# Patient Record
Sex: Female | Born: 1945 | Race: Black or African American | Hispanic: No | Smoking: Never smoker
Health system: Southern US, Community
[De-identification: ages and names within clinical notes are randomized; demographics above are authoritative.]

## PROBLEM LIST (undated history)

## (undated) DIAGNOSIS — F32A Depression, unspecified: Secondary | ICD-10-CM

## (undated) DIAGNOSIS — I10 Essential (primary) hypertension: Secondary | ICD-10-CM

## (undated) DIAGNOSIS — Z780 Asymptomatic menopausal state: Secondary | ICD-10-CM

## (undated) DIAGNOSIS — M1990 Unspecified osteoarthritis, unspecified site: Secondary | ICD-10-CM

## (undated) DIAGNOSIS — M26629 Arthralgia of temporomandibular joint, unspecified side: Secondary | ICD-10-CM

## (undated) DIAGNOSIS — M7918 Myalgia, other site: Secondary | ICD-10-CM

## (undated) DIAGNOSIS — G8929 Other chronic pain: Secondary | ICD-10-CM

## (undated) DIAGNOSIS — T7840XA Allergy, unspecified, initial encounter: Secondary | ICD-10-CM

## (undated) DIAGNOSIS — E785 Hyperlipidemia, unspecified: Secondary | ICD-10-CM

## (undated) DIAGNOSIS — M549 Dorsalgia, unspecified: Secondary | ICD-10-CM

## (undated) DIAGNOSIS — F329 Major depressive disorder, single episode, unspecified: Secondary | ICD-10-CM

## (undated) HISTORY — DX: Hyperlipidemia, unspecified: E78.5

## (undated) HISTORY — DX: Unspecified osteoarthritis, unspecified site: M19.90

## (undated) HISTORY — DX: Allergy, unspecified, initial encounter: T78.40XA

## (undated) HISTORY — DX: Essential (primary) hypertension: I10

## (undated) HISTORY — PX: OTHER SURGICAL HISTORY: SHX169

## (undated) HISTORY — DX: Asymptomatic menopausal state: Z78.0

## (undated) HISTORY — DX: Arthralgia of temporomandibular joint, unspecified side: M26.629

## (undated) HISTORY — DX: Depression, unspecified: F32.A

## (undated) HISTORY — DX: Myalgia, other site: M79.18

## (undated) HISTORY — DX: Major depressive disorder, single episode, unspecified: F32.9

---

## 1998-12-13 ENCOUNTER — Other Ambulatory Visit: Admission: RE | Admit: 1998-12-13 | Discharge: 1998-12-13 | Payer: Self-pay | Admitting: Obstetrics & Gynecology

## 2000-06-11 ENCOUNTER — Other Ambulatory Visit: Admission: RE | Admit: 2000-06-11 | Discharge: 2000-06-11 | Payer: Self-pay | Admitting: Obstetrics & Gynecology

## 2001-10-28 ENCOUNTER — Other Ambulatory Visit: Admission: RE | Admit: 2001-10-28 | Discharge: 2001-10-28 | Payer: Self-pay | Admitting: Obstetrics & Gynecology

## 2002-08-29 ENCOUNTER — Encounter: Payer: Self-pay | Admitting: Internal Medicine

## 2004-04-25 ENCOUNTER — Ambulatory Visit: Payer: Self-pay | Admitting: Pain Medicine

## 2004-04-26 ENCOUNTER — Ambulatory Visit: Payer: Self-pay | Admitting: Pain Medicine

## 2004-05-12 ENCOUNTER — Ambulatory Visit: Payer: Self-pay | Admitting: Pain Medicine

## 2004-05-13 ENCOUNTER — Ambulatory Visit: Payer: Self-pay | Admitting: Pain Medicine

## 2004-06-06 ENCOUNTER — Ambulatory Visit: Payer: Self-pay | Admitting: Physician Assistant

## 2004-08-10 ENCOUNTER — Other Ambulatory Visit: Admission: RE | Admit: 2004-08-10 | Discharge: 2004-08-10 | Payer: Self-pay | Admitting: Obstetrics & Gynecology

## 2004-08-30 ENCOUNTER — Ambulatory Visit: Payer: Self-pay | Admitting: Physician Assistant

## 2004-12-23 ENCOUNTER — Ambulatory Visit: Payer: Self-pay | Admitting: Internal Medicine

## 2004-12-26 ENCOUNTER — Ambulatory Visit: Payer: Self-pay | Admitting: Internal Medicine

## 2004-12-27 ENCOUNTER — Ambulatory Visit: Payer: Self-pay | Admitting: Physician Assistant

## 2005-03-08 ENCOUNTER — Ambulatory Visit: Payer: Self-pay | Admitting: Family Medicine

## 2005-05-11 ENCOUNTER — Ambulatory Visit: Payer: Self-pay | Admitting: Physician Assistant

## 2005-08-25 ENCOUNTER — Ambulatory Visit: Payer: Self-pay | Admitting: Internal Medicine

## 2005-09-05 ENCOUNTER — Ambulatory Visit: Payer: Self-pay | Admitting: Physician Assistant

## 2005-11-17 ENCOUNTER — Ambulatory Visit: Payer: Self-pay | Admitting: Internal Medicine

## 2006-01-04 ENCOUNTER — Ambulatory Visit: Payer: Self-pay | Admitting: Physician Assistant

## 2006-02-01 ENCOUNTER — Ambulatory Visit: Payer: Self-pay | Admitting: Pain Medicine

## 2006-02-06 ENCOUNTER — Ambulatory Visit: Payer: Self-pay | Admitting: Pain Medicine

## 2006-02-20 ENCOUNTER — Ambulatory Visit: Payer: Self-pay | Admitting: Pain Medicine

## 2006-03-01 ENCOUNTER — Ambulatory Visit: Payer: Self-pay | Admitting: Pain Medicine

## 2006-03-08 ENCOUNTER — Ambulatory Visit: Payer: Self-pay | Admitting: Pain Medicine

## 2006-03-23 ENCOUNTER — Ambulatory Visit: Payer: Self-pay | Admitting: Internal Medicine

## 2006-04-09 ENCOUNTER — Ambulatory Visit: Payer: Self-pay | Admitting: Physician Assistant

## 2006-08-16 ENCOUNTER — Ambulatory Visit: Payer: Self-pay | Admitting: Physician Assistant

## 2006-08-20 ENCOUNTER — Ambulatory Visit: Payer: Self-pay | Admitting: Internal Medicine

## 2006-11-20 ENCOUNTER — Ambulatory Visit: Payer: Self-pay | Admitting: Physician Assistant

## 2007-01-04 ENCOUNTER — Encounter (INDEPENDENT_AMBULATORY_CARE_PROVIDER_SITE_OTHER): Payer: Self-pay | Admitting: *Deleted

## 2007-03-19 ENCOUNTER — Telehealth (INDEPENDENT_AMBULATORY_CARE_PROVIDER_SITE_OTHER): Payer: Self-pay | Admitting: *Deleted

## 2007-03-21 ENCOUNTER — Ambulatory Visit: Payer: Self-pay | Admitting: Physician Assistant

## 2007-04-16 ENCOUNTER — Ambulatory Visit: Payer: Self-pay | Admitting: Physician Assistant

## 2007-06-18 ENCOUNTER — Telehealth (INDEPENDENT_AMBULATORY_CARE_PROVIDER_SITE_OTHER): Payer: Self-pay | Admitting: *Deleted

## 2007-07-01 ENCOUNTER — Ambulatory Visit: Payer: Self-pay | Admitting: Internal Medicine

## 2007-07-01 DIAGNOSIS — N951 Menopausal and female climacteric states: Secondary | ICD-10-CM | POA: Insufficient documentation

## 2007-07-01 DIAGNOSIS — E785 Hyperlipidemia, unspecified: Secondary | ICD-10-CM | POA: Insufficient documentation

## 2007-07-01 DIAGNOSIS — F321 Major depressive disorder, single episode, moderate: Secondary | ICD-10-CM | POA: Insufficient documentation

## 2007-07-01 DIAGNOSIS — F418 Other specified anxiety disorders: Secondary | ICD-10-CM

## 2007-07-01 DIAGNOSIS — I10 Essential (primary) hypertension: Secondary | ICD-10-CM | POA: Insufficient documentation

## 2007-07-01 DIAGNOSIS — F32A Depression, unspecified: Secondary | ICD-10-CM | POA: Insufficient documentation

## 2007-07-01 DIAGNOSIS — J309 Allergic rhinitis, unspecified: Secondary | ICD-10-CM | POA: Insufficient documentation

## 2007-07-08 LAB — CONVERTED CEMR LAB
BUN: 10 mg/dL (ref 6–23)
Basophils Relative: 0.4 % (ref 0.0–1.0)
CO2: 30 meq/L (ref 19–32)
Calcium: 9.9 mg/dL (ref 8.4–10.5)
Chloride: 99 meq/L (ref 96–112)
Eosinophils Absolute: 0.1 10*3/uL (ref 0.0–0.6)
Eosinophils Relative: 1.6 % (ref 0.0–5.0)
GFR calc Af Amer: 110 mL/min
GFR calc non Af Amer: 91 mL/min
Glucose, Bld: 92 mg/dL (ref 70–99)
Monocytes Relative: 6.9 % (ref 3.0–11.0)
Neutro Abs: 2.7 10*3/uL (ref 1.4–7.7)
Platelets: 201 10*3/uL (ref 150–400)
RBC: 4.28 M/uL (ref 3.87–5.11)
WBC: 5.2 10*3/uL (ref 4.5–10.5)

## 2007-08-15 ENCOUNTER — Ambulatory Visit: Payer: Self-pay | Admitting: Internal Medicine

## 2007-10-23 ENCOUNTER — Ambulatory Visit: Payer: Self-pay | Admitting: Physician Assistant

## 2007-11-21 ENCOUNTER — Ambulatory Visit: Payer: Self-pay | Admitting: Physician Assistant

## 2008-02-25 ENCOUNTER — Ambulatory Visit: Payer: Self-pay | Admitting: Physician Assistant

## 2008-02-25 ENCOUNTER — Encounter: Payer: Self-pay | Admitting: Internal Medicine

## 2008-04-17 ENCOUNTER — Ambulatory Visit: Payer: Self-pay | Admitting: Internal Medicine

## 2008-04-17 ENCOUNTER — Encounter: Payer: Self-pay | Admitting: Internal Medicine

## 2008-04-17 ENCOUNTER — Other Ambulatory Visit: Admission: RE | Admit: 2008-04-17 | Discharge: 2008-04-17 | Payer: Self-pay | Admitting: Internal Medicine

## 2008-04-20 ENCOUNTER — Ambulatory Visit: Payer: Self-pay | Admitting: Internal Medicine

## 2008-04-23 ENCOUNTER — Encounter (INDEPENDENT_AMBULATORY_CARE_PROVIDER_SITE_OTHER): Payer: Self-pay | Admitting: *Deleted

## 2008-04-24 ENCOUNTER — Telehealth (INDEPENDENT_AMBULATORY_CARE_PROVIDER_SITE_OTHER): Payer: Self-pay | Admitting: *Deleted

## 2008-04-24 LAB — CONVERTED CEMR LAB
ALT: 15 units/L (ref 0–35)
AST: 18 units/L (ref 0–37)
Basophils Absolute: 0 10*3/uL (ref 0.0–0.1)
Basophils Relative: 1.2 % (ref 0.0–3.0)
CO2: 30 meq/L (ref 19–32)
Chloride: 108 meq/L (ref 96–112)
Creatinine, Ser: 0.6 mg/dL (ref 0.4–1.2)
Direct LDL: 199.5 mg/dL
Eosinophils Absolute: 0.1 10*3/uL (ref 0.0–0.7)
GFR calc non Af Amer: 109 mL/min
HDL: 44.7 mg/dL (ref >39.0)
Lymphocytes Relative: 38.3 % (ref 12.0–46.0)
MCHC: 34 g/dL (ref 30.0–36.0)
MCV: 90 fL (ref 78.0–100.0)
Neutrophils Relative %: 46.7 % (ref 43.0–77.0)
Platelets: 175 10*3/uL (ref 150–400)
Potassium: 4.3 meq/L (ref 3.5–5.1)
RBC: 4.21 M/uL (ref 3.87–5.11)
Sodium: 143 meq/L (ref 135–145)
TSH: 1.03 microintl units/mL (ref 0.35–5.50)
Triglycerides: 80 mg/dL (ref 0–149)
VLDL: 16 mg/dL (ref 0–40)
Vit D, 1,25-Dihydroxy: 11 — ABNORMAL LOW (ref 30–89)

## 2008-04-30 ENCOUNTER — Encounter: Payer: Self-pay | Admitting: Internal Medicine

## 2008-04-30 ENCOUNTER — Encounter: Admission: RE | Admit: 2008-04-30 | Discharge: 2008-04-30 | Payer: Self-pay | Admitting: Internal Medicine

## 2008-05-08 ENCOUNTER — Encounter (INDEPENDENT_AMBULATORY_CARE_PROVIDER_SITE_OTHER): Payer: Self-pay | Admitting: *Deleted

## 2008-08-18 ENCOUNTER — Ambulatory Visit: Payer: Self-pay | Admitting: Physician Assistant

## 2008-09-03 ENCOUNTER — Encounter: Payer: Self-pay | Admitting: Pain Medicine

## 2008-09-15 ENCOUNTER — Ambulatory Visit: Payer: Self-pay | Admitting: Physician Assistant

## 2008-09-21 ENCOUNTER — Encounter: Payer: Self-pay | Admitting: Pain Medicine

## 2008-10-22 ENCOUNTER — Encounter: Payer: Self-pay | Admitting: Pain Medicine

## 2008-11-03 ENCOUNTER — Ambulatory Visit: Payer: Self-pay | Admitting: Internal Medicine

## 2008-11-03 DIAGNOSIS — E559 Vitamin D deficiency, unspecified: Secondary | ICD-10-CM | POA: Insufficient documentation

## 2008-12-15 ENCOUNTER — Ambulatory Visit: Payer: Self-pay | Admitting: Physician Assistant

## 2008-12-29 ENCOUNTER — Ambulatory Visit: Payer: Self-pay | Admitting: Internal Medicine

## 2009-01-01 ENCOUNTER — Telehealth: Payer: Self-pay | Admitting: Internal Medicine

## 2009-01-01 LAB — CONVERTED CEMR LAB
ALT: 18 units/L (ref 0–35)
AST: 20 units/L (ref 0–37)
Direct LDL: 161.3 mg/dL
HDL: 46.2 mg/dL (ref >39.00)

## 2009-01-21 ENCOUNTER — Ambulatory Visit: Payer: Self-pay | Admitting: Internal Medicine

## 2009-04-06 ENCOUNTER — Telehealth (INDEPENDENT_AMBULATORY_CARE_PROVIDER_SITE_OTHER): Payer: Self-pay | Admitting: *Deleted

## 2009-04-15 ENCOUNTER — Ambulatory Visit: Payer: Self-pay | Admitting: Physician Assistant

## 2009-05-05 ENCOUNTER — Ambulatory Visit: Payer: Self-pay | Admitting: Internal Medicine

## 2009-05-05 ENCOUNTER — Encounter: Payer: Self-pay | Admitting: Internal Medicine

## 2009-05-05 ENCOUNTER — Other Ambulatory Visit: Admission: RE | Admit: 2009-05-05 | Discharge: 2009-05-05 | Payer: Self-pay | Admitting: Internal Medicine

## 2009-05-17 ENCOUNTER — Encounter (INDEPENDENT_AMBULATORY_CARE_PROVIDER_SITE_OTHER): Payer: Self-pay | Admitting: *Deleted

## 2009-05-24 ENCOUNTER — Encounter: Admission: RE | Admit: 2009-05-24 | Discharge: 2009-05-24 | Payer: Self-pay | Admitting: Internal Medicine

## 2009-06-25 ENCOUNTER — Ambulatory Visit: Payer: Self-pay | Admitting: Family

## 2009-07-13 ENCOUNTER — Ambulatory Visit: Payer: Self-pay | Admitting: Physician Assistant

## 2009-07-30 ENCOUNTER — Encounter (INDEPENDENT_AMBULATORY_CARE_PROVIDER_SITE_OTHER): Payer: Self-pay | Admitting: *Deleted

## 2009-07-30 ENCOUNTER — Ambulatory Visit: Payer: Self-pay | Admitting: Internal Medicine

## 2009-08-02 ENCOUNTER — Encounter (INDEPENDENT_AMBULATORY_CARE_PROVIDER_SITE_OTHER): Payer: Self-pay | Admitting: *Deleted

## 2009-08-02 LAB — CONVERTED CEMR LAB: Fecal Occult Bld: NEGATIVE

## 2009-08-27 ENCOUNTER — Ambulatory Visit: Payer: Self-pay | Admitting: Family Medicine

## 2009-09-15 ENCOUNTER — Telehealth (INDEPENDENT_AMBULATORY_CARE_PROVIDER_SITE_OTHER): Payer: Self-pay | Admitting: *Deleted

## 2009-09-23 ENCOUNTER — Ambulatory Visit: Payer: Self-pay | Admitting: Internal Medicine

## 2009-09-27 LAB — CONVERTED CEMR LAB
ALT: 22 units/L (ref 0–35)
Total CHOL/HDL Ratio: 3
Triglycerides: 73 mg/dL (ref 0.0–149.0)

## 2009-09-28 ENCOUNTER — Ambulatory Visit: Payer: Self-pay | Admitting: Internal Medicine

## 2009-09-28 DIAGNOSIS — R631 Polydipsia: Secondary | ICD-10-CM | POA: Insufficient documentation

## 2009-09-28 LAB — CONVERTED CEMR LAB: Glucose, Bld: 94 mg/dL

## 2009-10-21 ENCOUNTER — Encounter: Payer: Self-pay | Admitting: Internal Medicine

## 2009-10-21 ENCOUNTER — Ambulatory Visit: Payer: Self-pay | Admitting: Pain Medicine

## 2009-11-03 ENCOUNTER — Ambulatory Visit: Payer: Self-pay | Admitting: Internal Medicine

## 2009-11-30 ENCOUNTER — Ambulatory Visit: Payer: Self-pay | Admitting: Pain Medicine

## 2009-12-13 ENCOUNTER — Ambulatory Visit: Payer: Self-pay | Admitting: Pain Medicine

## 2010-04-11 ENCOUNTER — Ambulatory Visit: Payer: Self-pay | Admitting: Internal Medicine

## 2010-04-28 ENCOUNTER — Telehealth: Payer: Self-pay | Admitting: Internal Medicine

## 2010-05-17 ENCOUNTER — Ambulatory Visit: Payer: Self-pay | Admitting: Internal Medicine

## 2010-05-17 DIAGNOSIS — M26629 Arthralgia of temporomandibular joint, unspecified side: Secondary | ICD-10-CM

## 2010-05-17 HISTORY — DX: Arthralgia of temporomandibular joint, unspecified side: M26.629

## 2010-05-25 ENCOUNTER — Encounter: Admission: RE | Admit: 2010-05-25 | Discharge: 2010-05-25 | Payer: Self-pay | Admitting: Internal Medicine

## 2010-07-12 ENCOUNTER — Ambulatory Visit: Payer: Self-pay | Admitting: Pain Medicine

## 2010-08-17 ENCOUNTER — Ambulatory Visit: Payer: Self-pay | Admitting: Pain Medicine

## 2010-08-21 LAB — CONVERTED CEMR LAB
AST: 17 units/L (ref 0–37)
CO2: 29 meq/L (ref 19–32)
Calcium: 9.5 mg/dL (ref 8.4–10.5)
Cholesterol: 235 mg/dL — ABNORMAL HIGH (ref 0–200)
Direct LDL: 181.3 mg/dL
GFR calc non Af Amer: 90.46 mL/min (ref >60)
Potassium: 4.3 meq/L (ref 3.5–5.1)
Sodium: 142 meq/L (ref 135–145)
Total CHOL/HDL Ratio: 5
Triglycerides: 68 mg/dL (ref 0.0–149.0)
Vit D, 25-Hydroxy: 27 ng/mL — ABNORMAL LOW (ref 30–89)

## 2010-08-22 ENCOUNTER — Telehealth: Payer: Self-pay | Admitting: Internal Medicine

## 2010-08-24 ENCOUNTER — Telehealth (INDEPENDENT_AMBULATORY_CARE_PROVIDER_SITE_OTHER): Payer: Self-pay | Admitting: *Deleted

## 2010-08-25 NOTE — Assessment & Plan Note (Signed)
Summary: FOR MED REFILL AND HANDICAP /FLU SHOT//PH   Vital Signs:  Patient profile:   65 year old female Weight:      275 pounds Pulse rate:   85 / minute Pulse rhythm:   regular BP sitting:   128 / 82  (left arm) Cuff size:   large  Vitals Entered By: Army Fossa CMA (April 11, 2010 1:49 PM) CC: Pt here for Med refill Comments would like a permament handicap sticker Back pain still bothering her pharm- [facility] [address]   History of Present Illness: routine office visit Needs her medication refill I did review a letter from pain management requested me to take over her refill of pain medicine.  ROS Good medication compliance with Crestor  and BP meds as far as the anxiety and depression symptoms are well controlled, because she is taking several meds, she tried to " cutdown" on Zoloft but she got nervous, unclear if she did that gradually or abruptly. Overall she would like to see if she can take less SSRIs. She has good days and a few bad days.    Current Medications (verified): 1)  Crestor 20 Mg Tabs (Rosuvastatin Calcium) .... Take 1/2 Tablet Daily 2)  Hydrochlorothiazide 25 Mg Tabs (Hydrochlorothiazide) .[name].. 1 By Mouth Qd 3)  Zestril 10 Mg Tabs (Lisinopril) .[name].. 1 By Mouth Qd 4)  Zoloft 100 Mg  Tabs (Sertraline Hcl) .[name].. 1 Daily 5)  Vicodin 5-500 Mg  Tabs (Hydrocodone-Acetaminophen) .... Qid As Needed Back Pain 6)  Flexeril 10 Mg  Tabs (Cyclobenzaprine Hcl) .... Tid 7)  Lidoderm 5 %  Ptch (Lidocaine) 8)  Meloxicam 7.5 Mg Tabs (Meloxicam) .... Take 1 By Mouth Daily 9)  Asa 81mg  .... 1 By Mouth Qd 10)  Peppermint Spirit  Sprt (Peppermint Spirit) .... Take One Daily 11)  Fish Oil 12)  Otc Vit D  Allergies (verified): 1)  ! Zocor (Simvastatin)  Past History:  Past Medical History: Allergic rhinitis Hyperlipidemia Hypertension Osteoarthritis--chronic back and knee pain, see pain management ; on disability Depression, h/o Menopause , onset age  69  Past Surgical History: Reviewed history from 08/15/2007 and no changes required. gravid 3 para 3  Social History: Reviewed history from 05/05/2009 and no changes required. Married 3 kids not working,  on  disability   Never Smoked Alcohol use-no Drug use-no Regular exercise-no  Physical Exam  General:  alert, well-developed, and overweight-appearing.   Lungs:  normal respiratory effort, no intercostal retractions, no accessory muscle use, and normal breath sounds.   Heart:  normal rate, regular rhythm, and no murmur.   Psych:  Oriented X3, good eye contact, not anxious appearing, and not depressed appearing.     Impression & Recommendations:  Problem # 1:  OSTEOARTHRITIS (ICD-715.90) DJD with chronic back pain Pain management asked me to start refilling her medicines Will do. she takes 3 or 4 hydrocodone tablets a day She takes one Flexeril daily ( sometimes 2) We'll sign a parking permit every 6 months, I have hope that at some point she will not need a parking permit (in the past was offered knee surgery and she couldn't afford) Her updated medication list for this problem includes:    Vicodin 5-500 Mg Tabs (Hydrocodone-acetaminophen) .[name]... 1 every 6 hours as needed for  back pain    Meloxicam 7.5 Mg Tabs (Meloxicam) .[name]... Take 1 by mouth daily  Problem # 2:  DEPRESSION (ICD-311) see review of systems Will the crease the Zoloft to 75 mg  and reassess and return to the office  Her updated medication list for this problem includes:    Zoloft 50 Mg Tabs (Sertraline hcl) .[name]... 1.5 tablet daily  Complete Medication List: 1)  Crestor 20 Mg Tabs (Rosuvastatin calcium) .... Take 1/2 tablet daily 2)  Hydrochlorothiazide 25 Mg Tabs (Hydrochlorothiazide) .[name].. 1 by mouth qd 3)  Zestril 10 Mg Tabs (Lisinopril) .[name].. 1 by mouth qd 4)  Zoloft 50 Mg Tabs (Sertraline hcl) .... 1.5 tablet daily 5)  Vicodin 5-500 Mg Tabs (Hydrocodone-acetaminophen) .[name].. 1 every 6 hours as needed for   back pain 6)  Flexeril 10 Mg Tabs (Cyclobenzaprine hcl) .[name].. 1 by mouth two times a day as needed pain 7)  Lidoderm 5 % Ptch (Lidocaine) 8)  Meloxicam 7.5 Mg Tabs (Meloxicam) .... Take 1 by mouth daily 9)  Asa 81mg   .... 1 by mouth qd 10)  Peppermint Spirit Sprt (Peppermint spirit) .... Take one daily 11)  Fish Oil  12)  Otc Vit D   Other Orders: Flu Vaccine 29yrs + MEDICARE PATIENTS (Z6109) Administration Flu vaccine - MCR (U0454)  Patient Instructions: 1)  Please schedule a follow-up appointment in 2 or 3  months.  fasting, physical exam Prescriptions: FLEXERIL 10 MG  TABS (CYCLOBENZAPRINE HCL) 1 by mouth two times a day as needed pain  #60 x 6   Entered and Authorized by:   Elita Quick E. Paz MD   Signed by:   Nolon Rod. Paz MD on 04/11/2010   Method used:   Print then Give to Patient   RxID:   [id number] CRESTOR 20 MG TABS (ROSUVASTATIN CALCIUM) take 1/2 tablet daily  #90 x 3   Entered and Authorized by:   Nolon Rod. Paz MD   Signed by:   Nolon Rod. Paz MD on 04/11/2010   Method used:   Print then Give to Patient   RxID:   [id number] VICODIN 5-500 MG  TABS (HYDROCODONE-ACETAMINOPHEN) 1 every 6 hours as needed for  back pain  #120 x 1   Entered and Authorized by:   Nolon Rod. Paz MD   Signed by:   Nolon Rod. Paz MD on 04/11/2010   Method used:   Print then Give to Patient   RxID:   [id number] ZOLOFT 50 MG TABS (SERTRALINE HCL) 1.5 tablet daily  #45 x 3   Entered and Authorized by:   Nolon Rod. Paz MD   Signed by:   Nolon Rod. Paz MD on 04/11/2010   Method used:   Electronically to        [facility] [address] [phone]* (retail)       [address]       [place], [place]  [address]       Ph: [phone]       Fax: [phone]   RxID:   [id number] ZOLOFT 100 MG  TABS (SERTRALINE HCL) 1 daily  #90 x 1   Entered by:   Army Fossa CMA   Authorized by:   Nolon Rod. Paz MD   Signed by:   Army Fossa CMA on 04/11/2010   Method used:   Electronically to        [facility]  [address] [phone]* (retail)       [address]       [place], [place]  [address]       Ph: [phone]       Fax: [phone]   RxID:   [id number] ZESTRIL 10 MG TABS (LISINOPRIL) 1  by mouth qd  #90 Tablet x 1   Entered by:   Army Fossa CMA   Authorized by:   Nolon Rod. Paz MD   Signed by:   Army Fossa CMA on 04/11/2010   Method used:   Electronically to        [facility] [address] [phone]* (retail)       [address]       [place], [place]  [address]       Ph: [phone]       Fax: [phone]   RxID:   [id number] HYDROCHLOROTHIAZIDE 25 MG TABS (HYDROCHLOROTHIAZIDE) 1 by mouth qd  #90 x 1   Entered by:   Army Fossa CMA   Authorized by:   Nolon Rod. Paz MD   Signed by:   Army Fossa CMA on 04/11/2010   Method used:   Electronically to        [facility] [phone]* (retail)       [address]       [place], [place]  [address]       Ph: [phone]       Fax: [phone]   RxID:   [id number]  Flu Vaccine Consent Questions     Do you have a history of severe allergic reactions to this vaccine? no    Any prior history of allergic reactions to egg and/or gelatin? no    Do you have a sensitivity to the preservative Thimersol? no    Do you have a past history of Guillan-Barre Syndrome? no    Do you currently have an acute febrile illness? no    Have you ever had a severe reaction to latex? no    Vaccine information given and explained to patient? yes    Are you currently pregnant? no    Lot Number:[id number]   Exp Date:01/21/2011   Site Given  Left Deltoid [id number]      .lbmedflu

## 2010-08-25 NOTE — Letter (Signed)
Summary: Handicapped Placard/[facility]  Handicapped Placard/[facility]   Imported By: Lanelle Bal 10/04/2009 08:49:56  _____________________________________________________________________  External Attachment:    Type:   Image     Comment:   External Document

## 2010-08-25 NOTE — Progress Notes (Signed)
Summary: NEEDS FORM [facility] HANDICAP PLACARD dr Drue Novel has  Phone Note Call from Patient Call back at [facility] Phone [phone]   Caller: Patient Summary of Call: PATIENT SAYS HER HANDICAP PLACARD EXPIRED IN DECEMBER 2010, BUT SHE NEVER RECEIVED ANYTHING IN THE MAIL  WOULD LIKE DR PAZ TO FILL OUT ANOTHER FORM FOR HER SO SHE CAN GET A RENEWAL ON HER PLACARD  PLEASE CALL HER AT [phone] Initial call taken by: Jerolyn Shin,  September 15, 2009 1:02 PM  Follow-up for Phone Call        Spoke with pt informed Dr Drue Novel has form, pt says she continues to have back pain, ov scheduled.  Also pt never had labs done re; cholesterol , labs scheduled .[name]  September 21, 2009 9:59 AM  Follow-up by: [name],  September 21, 2009 9:59 AM     Appended Document: NEEDS FORM [facility] HANDICAP PLACARD dr Drue Novel has will discuss handicap sticker at time of the next office visit

## 2010-08-25 NOTE — Progress Notes (Signed)
Summary: Crestor samples  Phone Note Call from Patient Call back at [facility] Phone [phone]   Summary of Call: Patient left message on triage requesting samples of Crestor b/c she cannot afford the med right now. I left message on voicemail notifying the patient that samples are up front and ready for pickup. Initial call taken by: Lucious Groves CMA,  April 28, 2010 2:17 PM    Prescriptions: CRESTOR 20 MG TABS (ROSUVASTATIN CALCIUM) take 1/2 tablet daily  #28 x 0   Entered by:   Lucious Groves CMA   Authorized by:   Nolon Rod. Paz MD   Signed by:   Lucious Groves CMA on 04/28/2010   Method used:   Samples Given   RxID:   [id number]

## 2010-08-25 NOTE — Assessment & Plan Note (Signed)
Summary: cough//lh   Vital Signs:  Patient profile:   65 year old female Weight:      268 pounds Temp:     98.7 degrees F oral Pulse rate:   76 / minute Pulse rhythm:   regular BP sitting:   124 / 84  (left arm) Cuff size:   large  Vitals Entered By: Army Fossa CMA (August 27, 2009 8:34 AM) CC: Pt c/o x 2 weeks coughing worse at night, burning in her nose. , URI symptoms   History of Present Illness:       This is a 65 year old woman who presents with URI symptoms.  The symptoms began 2 weeks ago.  No otc tried.  The patient complains of nasal congestion, purulent nasal discharge, and productive cough, but denies clear nasal discharge, sore throat, dry cough, earache, and sick contacts.  The patient denies fever, low-grade fever (<100.5 degrees), fever of 100.5-103 degrees, fever of 103.1-104 degrees, fever to >104 degrees, stiff neck, dyspnea, wheezing, rash, vomiting, diarrhea, use of an antipyretic, and response to antipyretic.  The patient also reports headache.  The patient denies itchy watery eyes, itchy throat, sneezing, seasonal symptoms, response to antihistamine, muscle aches, and severe fatigue.  Risk factors for Strep sinusitis include unilateral facial pain.  The patient denies the following risk factors for Strep sinusitis: unilateral nasal discharge, poor response to decongestant, double sickening, tooth pain, Strep exposure, tender adenopathy, and absence of cough.    Current Medications (verified): 1)  Hydrochlorothiazide 25 Mg Tabs (Hydrochlorothiazide) .[name].. 1 By Mouth Qd 2)  Zestril 10 Mg Tabs (Lisinopril) .[name].. 1 By Mouth Qd 3)  Zoloft 100 Mg  Tabs (Sertraline Hcl) .[name].. 1 Daily 4)  Vicodin 5-500 Mg  Tabs (Hydrocodone-Acetaminophen) .... Qid As Needed Back Pain 5)  Flexeril 10 Mg  Tabs (Cyclobenzaprine Hcl) .... Tid 6)  Lidoderm 5 %  Ptch (Lidocaine) 7)  Asa 81mg  .... 1 By Mouth Qd 8)  D 5000 5000 Unit Caps (Cholecalciferol) .... One P.o. Q.d. 9)  Fish Oil 10)   Crestor 20 Mg Tabs (Rosuvastatin Calcium) .... Take 1/2 Tablet Daily 11)  Peppermint Spirit  Sprt (Peppermint Spirit) .... Take One Daily 12)  Cinnemon .... Take One Daily 13)  Mobic 7.5 Mg Tabs (Meloxicam) .... One Tablet By Mouth Daily As Needed Back Pain 14)  Meloxicam 7.5 Mg Tabs (Meloxicam) .... Take 1 By Mouth Daily 15)  Augmentin 875-125 Mg Tabs (Amoxicillin-Pot Clavulanate) .[name].. 1 By Mouth Two Times A Day 16)  Cheratussin Ac 100-10 Mg/35ml Syrp (Guaifenesin-Codeine) .[name].. 1-2 Tsp By Mouth At Bedtime As Needed  Allergies: 1)  ! Zocor (Simvastatin)  Past History:  Past medical, surgical, family and social histories (including risk factors) reviewed for relevance to current acute and chronic problems.  Past Medical History: Reviewed history from 05/05/2009 and no changes required. Allergic rhinitis Hyperlipidemia Hypertension Osteoarthritis Depression, h/o  Past Surgical History: Reviewed history from 08/15/2007 and no changes required. gravid 3 para 3  Family History: Reviewed history from 04/17/2008 and no changes required. MI--no DM--no Colon ca--no Breast ca--Aunts (several), 2 cousines cervical cancer (?)-- sister Stomach cancer--brother  Social History: Reviewed history from 05/05/2009 and no changes required. Married 3 kids not working,  on  disability   Never Smoked Alcohol use-no Drug use-no Regular exercise-no  Review of Systems      See HPI  Physical Exam  General:  Well-developed,well-nourished,in no acute distress; alert,appropriate and cooperative throughout examination Ears:  External ear exam  shows no significant lesions or deformities.  Otoscopic examination reveals clear canals, tympanic membranes are intact bilaterally without bulging, retraction, inflammation or discharge. Hearing is grossly normal bilaterally. Nose:  L frontal sinus tenderness, L maxillary sinus tenderness, R frontal sinus tenderness, and R maxillary sinus tenderness.     Mouth:  Oral mucosa and oropharynx without lesions or exudates.  Teeth in good repair. Neck:  supple and cervical lymphadenopathy.   Lungs:  Normal respiratory effort, chest expands symmetrically. Lungs are clear to auscultation, no crackles or wheezes. Heart:  normal rate and no murmur.   Cervical Nodes:  R anterior LN enlarged and L anterior LN enlarged.   Psych:  Oriented X3 and normally interactive.     Impression & Recommendations:  Problem # 1:  SINUSITIS - ACUTE-NOS (ICD-461.9)  Her updated medication list for this problem includes:    Augmentin 875-125 Mg Tabs (Amoxicillin-pot clavulanate) .[name]... 1 by mouth two times a day    Cheratussin Ac 100-10 Mg/27ml Syrp (Guaifenesin-codeine) .[name]... 1-2 tsp by mouth at bedtime as needed    Veramyst 27.5 Mcg/spray Susp (Fluticasone furoate) .[name]... 2 sprays each nostril once daily  Instructed on treatment. Call if symptoms persist or worsen.   Complete Medication List: 1)  Hydrochlorothiazide 25 Mg Tabs (Hydrochlorothiazide) .[name].. 1 by mouth qd 2)  Zestril 10 Mg Tabs (Lisinopril) .[name].. 1 by mouth qd 3)  Zoloft 100 Mg Tabs (Sertraline hcl) .[name].. 1 daily 4)  Vicodin 5-500 Mg Tabs (Hydrocodone-acetaminophen) .... Qid as needed back pain 5)  Flexeril 10 Mg Tabs (Cyclobenzaprine hcl) .... Tid 6)  Lidoderm 5 % Ptch (Lidocaine) 7)  Asa 81mg   .... 1 by mouth qd 8)  D 5000 5000 Unit Caps (Cholecalciferol) .... One p.o. q.d. 9)  Fish Oil  10)  Crestor 20 Mg Tabs (Rosuvastatin calcium) .... Take 1/2 tablet daily 11)  Peppermint Spirit Sprt (Peppermint spirit) .... Take one daily 12)  Cinnemon  .... Take one daily 13)  Mobic 7.5 Mg Tabs (Meloxicam) .... One tablet by mouth daily as needed back pain 14)  Meloxicam 7.5 Mg Tabs (Meloxicam) .... Take 1 by mouth daily 15)  Augmentin 875-125 Mg Tabs (Amoxicillin-pot clavulanate) .[name].. 1 by mouth two times a day 16)  Cheratussin Ac 100-10 Mg/17ml Syrp (Guaifenesin-codeine) .[name].. 1-2 tsp by mouth at bedtime as  needed 17)  Veramyst 27.5 Mcg/spray Susp (Fluticasone furoate) .... 2 sprays each nostril once daily Prescriptions: VERAMYST 27.5 MCG/SPRAY SUSP (FLUTICASONE FUROATE) 2 sprays each nostril once daily  #1 x 0   Entered and Authorized by:   Loreen Freud DO   Signed by:   Loreen Freud DO on 08/27/2009   Method used:   Historical   RxID:   [id number] CHERATUSSIN AC 100-10 MG/5ML SYRP (GUAIFENESIN-CODEINE) 1-2 tsp by mouth at bedtime as needed  #6 oz x 0   Entered and Authorized by:   Loreen Freud DO   Signed by:   Loreen Freud DO on 08/27/2009   Method used:   Print then Give to Patient   RxID:   [id number] AUGMENTIN 875-125 MG TABS (AMOXICILLIN-POT CLAVULANATE) 1 by mouth two times a day  #20 x 0   Entered and Authorized by:   Loreen Freud DO   Signed by:   Loreen Freud DO on 08/27/2009   Method used:   Electronically to        [facility] [address] [phone]* (retail)       [address]  [place], [place]  [address]       Ph: [phone]       Fax: [phone]   RxID:   [id number]

## 2010-08-25 NOTE — Letter (Signed)
Summary: [facility]-- request this clinic to RF pain meds  [facility]   Imported By: Lanelle Bal 10/29/2009 11:03:04  _____________________________________________________________________  External Attachment:    Type:   Image     Comment:   External Document

## 2010-08-25 NOTE — Assessment & Plan Note (Signed)
Summary: back pain, handicap renewal/alr   Vital Signs:  Patient profile:   65 year old female Height:      64.75 inches Weight:      275.6 pounds BMI:     46.38 Pulse rate:   64 / minute BP sitting:   112 / 80  Vitals Entered By: Shary Decamp (September 28, 2009 4:17 PM) Is Patient Diabetic? No Comments  - wants to discuss black cohash - ok to take?  - request handicap parking permit  - c/o of being thirsty all the time Shary Decamp  September 28, 2009 4:18 PM    History of Present Illness: here with several issues  Current Medications (verified): 1)  Hydrochlorothiazide 25 Mg Tabs (Hydrochlorothiazide) .[name].. 1 By Mouth Qd 2)  Zestril 10 Mg Tabs (Lisinopril) .[name].. 1 By Mouth Qd 3)  Zoloft 100 Mg  Tabs (Sertraline Hcl) .[name].. 1 Daily 4)  Vicodin 5-500 Mg  Tabs (Hydrocodone-Acetaminophen) .... Qid As Needed Back Pain 5)  Flexeril 10 Mg  Tabs (Cyclobenzaprine Hcl) .... Tid 6)  Lidoderm 5 %  Ptch (Lidocaine) 7)  Asa 81mg  .... 1 By Mouth Qd 8)  D 5000 5000 Unit Caps (Cholecalciferol) .... One P.o. Q.d. 9)  Fish Oil 10)  Crestor 20 Mg Tabs (Rosuvastatin Calcium) .... Take 1/2 Tablet Daily 11)  Peppermint Spirit  Sprt (Peppermint Spirit) .... Take One Daily 12)  Cinnemon .... Take One Daily 13)  Mobic 7.5 Mg Tabs (Meloxicam) .... One Tablet By Mouth Daily As Needed Back Pain 14)  Meloxicam 7.5 Mg Tabs (Meloxicam) .... Take 1 By Mouth Daily 15)  Veramyst 27.5 Mcg/spray Susp (Fluticasone Furoate) .... 2 Sprays Each Nostril Once Daily  Allergies (verified): 1)  ! Zocor (Simvastatin)  Past History:  Past Medical History: Allergic rhinitis Hyperlipidemia Hypertension Osteoarthritis--chronic back and knee pain, see pain management ; on disability Depression, h/o Menopause , onset age 82  Past Surgical History: Reviewed history from 08/15/2007 and no changes required. gravid 3 para 3  Social History: Reviewed history from 05/05/2009 and no changes required. Married 3 kids not  working,  on  disability   Never Smoked Alcohol use-no Drug use-no Regular exercise-no  Review of Systems       her menopause since age 88, still has hot flashes, has not taking any hormones for medication for those symptoms; wants to discuss black cohash - ok to take? OA: most pain is in the back and knees,  follow-up by pain medicine. request handicap parking permit c/o of being thirsty all the time for the last couple of weeks, has increased her water intake.  Physical Exam  General:  alert, well-developed, and overweight-appearing.   Lungs:  Normal respiratory effort, chest expands symmetrically. Lungs are clear to auscultation, no crackles or wheezes. Heart:  normal rate and no murmur.   Extremities:  no edema   Impression & Recommendations:  Problem # 1:  POLYDIPSIA (ICD-783.5) normal CBG observe for now Orders: Glucose, (CBG) (87564) Fingerstick (33295)  Problem # 2:  OSTEOARTHRITIS (ICD-715.90) OA with chronic back and knee pain Will go ahead and sign a  parking permit   Her updated medication list for this problem includes:    Vicodin 5-500 Mg Tabs (Hydrocodone-acetaminophen) ..... Qid as needed back pain    Meloxicam 7.5 Mg Tabs (Meloxicam) .[name]... 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 Take 1/2 tablet daily 2)  Hydrochlorothiazide 25 Mg Tabs (Hydrochlorothiazide) .[name].. 1 by mouth qd 3)  Zestril 10 Mg Tabs (Lisinopril) .[name].. 1 by mouth qd 4)  Zoloft 100 Mg Tabs (Sertraline hcl) .[name].. 1 daily 5)  Vicodin 5-500 Mg Tabs (Hydrocodone-acetaminophen) .... Qid as needed back pain 6)  Flexeril 10 Mg Tabs (Cyclobenzaprine hcl) .... Tid 7)  Lidoderm 5 % Ptch (Lidocaine) 8)  Meloxicam 7.5 Mg Tabs (Meloxicam) .... Take 1 by mouth daily 9)  Veramyst 27.5 Mcg/spray Susp  (Fluticasone furoate) .... 2 sprays each nostril once daily 10)  Asa 81mg   .... 1 by mouth qd 11)  Peppermint Spirit Sprt (Peppermint spirit) .... Take one daily 12)  Cinnemon  .... Take one daily 13)  Fish Oil  14)  Otc Vit D   Patient Instructions: 1)  Please schedule a follow-up appointment in 3  months.  Prescriptions: CRESTOR 20 MG TABS (ROSUVASTATIN CALCIUM) take 1/2 tablet daily  #30 x 6   Entered and Authorized by:   Nolon Rod. Paz MD   Signed by:   Nolon Rod. Paz MD on 09/28/2009   Method used:   Print then Give to Patient   RxID:   [id number]   Laboratory Results   Blood Tests     Glucose (random): 94 mg/dL   (Normal Range: 86-578)

## 2010-08-25 NOTE — Letter (Signed)
Summary: [facility]: Immunoassay Fecal Occult Blood (iFOB) Order Form  [facility] [place]  [address] [place], [place] [address]   Phone: [phone]  Fax: [phone]      [facility]: Immunoassay Fecal Occult Blood (iFOB) Order Form   July 30, 2009 MRN: [id number]   Wanda [name] [birth year]   Physicican Name:_Jose Paz________________________  Diagnosis Code:__v765.1________________________      [name]

## 2010-08-25 NOTE — Assessment & Plan Note (Signed)
Summary: RT EAR PAIN/RH......   Vital Signs:  Patient profile:   65 year old female Weight:      277.13 pounds Temp:     97.1 degrees F oral Pulse rate:   87 / minute Pulse rhythm:   regular BP sitting:   128 / 86  (left arm) Cuff size:   large  Vitals Entered By: Army Fossa CMA (May 17, 2010 4:07 PM) CC: Pt here for (R) ear pain  Comments feels mostly when she chews x 2 weeks [facility] [address]    History of Present Illness: pain in the right ear (points at the TMJ) for 2 weeks. Pain is associated with a click and also worse when she opens her mouth wide and chew  ROS No fevers No sore throat or postnasal dripping No ear  discharge Right sinuses feel slightly congested no recent dental pain or dental procedures   Current Medications (verified): 1)  Crestor 20 Mg Tabs (Rosuvastatin Calcium) .... Take 1/2 Tablet Daily 2)  Hydrochlorothiazide 25 Mg Tabs (Hydrochlorothiazide) .[name].. 1 By Mouth Qd 3)  Zestril 10 Mg Tabs (Lisinopril) .[name].. 1 By Mouth Qd 4)  Zoloft 50 Mg Tabs (Sertraline Hcl) .... 1.5 Tablet Daily 5)  Vicodin 5-500 Mg  Tabs (Hydrocodone-Acetaminophen) .[name].. 1 Every 6 Hours As Needed For  Back Pain 6)  Flexeril 10 Mg  Tabs (Cyclobenzaprine Hcl) .[name].. 1 By Mouth Two Times A Day As Needed Pain 7)  Lidoderm 5 %  Ptch (Lidocaine) 8)  Meloxicam 7.5 Mg Tabs (Meloxicam) .... Take 1 By Mouth Daily 9)  Asa 81mg  .... 1 By Mouth Qd 10)  Fish Oil 11)  Otc Vit D  Allergies (verified): 1)  ! Zocor (Simvastatin)  Past History:  Past Medical History: Allergic rhinitis Hyperlipidemia Hypertension Osteoarthritis--chronic back and knee pain,  on disability Depression, h/o Menopause , onset age 72  Past Surgical History: Reviewed history from 08/15/2007 and no changes required. gravid 3 para 3  Social History: Reviewed history from 05/05/2009 and no changes required. Married 3 kids not working,  on  disability   Never Smoked Alcohol use-no Drug  use-no Regular exercise-no  Physical Exam  General:  alert and well-developed.   Head:  face symmetric, nontender to palpation of the sinuses. Palpation of the TMJ on the right side caused pain Ears:  R ear normal and L ear normal.   Nose:  not congested Mouth:  poor dentition, palpation of gums showed no abscess Neck:  no lymphadenopathy    Impression & Recommendations:  Problem # 1:  TMJ PAIN (ICD-524.62) no evidence of ear infection, pain likely related to TMJ Increase Mobic to 15 mg daily,. Advised her to take it with food ( history of good tolerance to anti-inflammatories) apply ice to the TMJ  nightly for a few days Avoid gum and hard chewing If no better, she is to contact her dentist  Complete Medication List: 1)  Crestor 20 Mg Tabs (Rosuvastatin calcium) .... Take 1/2 tablet daily 2)  Hydrochlorothiazide 25 Mg Tabs (Hydrochlorothiazide) .[name].. 1 by mouth qd 3)  Zestril 10 Mg Tabs (Lisinopril) .[name].. 1 by mouth qd 4)  Zoloft 50 Mg Tabs (Sertraline hcl) .... 1.5 tablet daily 5)  Vicodin 5-500 Mg Tabs (Hydrocodone-acetaminophen) .[name].. 1 every 6 hours as needed for  back pain 6)  Flexeril 10 Mg Tabs (Cyclobenzaprine hcl) .[name].. 1 by mouth two times a day as needed pain 7)  Lidoderm 5 % Ptch (Lidocaine) 8)  Mobic 15 Mg  Tabs (Meloxicam) .[name].. 1 by mouth once daily 9)  Asa 81mg   .... 1 by mouth qd 10)  Fish Oil  11)  Otc Vit D  Prescriptions: MOBIC 15 MG TABS (MELOXICAM) 1 by mouth once daily  #30 x 1   Entered and Authorized by:   Elita Quick E. Paz MD   Signed by:   Nolon Rod. Paz MD on 05/17/2010   Method used:   Electronically to        [facility] [address] [phone]* (retail)       [address]       [place], [place]  [address]       Ph: [phone]       Fax: [phone]   RxID:   [id number]    Orders Added: 1)  Est. Patient Level III [27062]

## 2010-08-25 NOTE — Letter (Signed)
Summary: Results Follow up Letter  [facility]  [address] [place], [place] [address]   Phone: [phone]  Fax: [phone]    08/02/2009 MRN: [id number]  Wanda [name] [address] [place], [place]  [address]  Dear Ms. Meriam Sprague,   Your stool test was negative for blood.   Please call me if you have any questions or concerns. Alena Bills [phone] ext [phone]

## 2010-08-31 NOTE — Progress Notes (Signed)
Summary: vicodin generic refill  Phone Note Refill Request Call back at Home Phone [phone] Message from:  Patient on August 24, 2010 2:59 PM  Refills Requested: Medication #1:  VICODIN 5-500 MG  TABS 1 every 6 hours as needed for  back pain   Notes: qty = 120 [facility] [address],  [place]     says injection from Dec 14 or 15, 2011  did not work more than 3-4 days----took last pill this morning---please call it in asap  Next Appointment Scheduled: none Initial call taken by: Jerolyn Shin,  August 24, 2010 3:01 PM  Follow-up for Phone Call        Please advise. Lucious Groves CMA  August 24, 2010 3:37 PM   Additional Follow-up for Phone Call Additional follow up Details #1::        120, no RF Additional Follow-up by: [facility] E. Paz MD,  August 25, 2010 8:03 AM    Prescriptions: VICODIN 5-500 MG  TABS (HYDROCODONE-ACETAMINOPHEN) 1 every 6 hours as needed for  back pain  #120 x 0   Entered by:   Army Fossa CMA   Authorized by:   Nolon Rod. Paz MD   Signed by:   Army Fossa CMA on 08/25/2010   Method used:   Printed then faxed to ...       [facility]  [address] [phone]* (retail)       [address]       [place], [place]  [address]       Ph: [phone]       Fax: [phone]   RxID:   [id number]

## 2010-08-31 NOTE — Progress Notes (Signed)
Summary: Refill--[facility]  Phone Note Refill Request Message from:  Pharmacy on August 22, 2010 3:53 PM  Refills Requested: Medication #1:  HYDROCHLOROTHIAZIDE 25 MG TABS 1 by mouth qd   Dosage confirmed as above?Dosage Confirmed   Supply Requested: 1 month [facility] [address]   Method Requested: Pick up at Office Initial call taken by: Lavell Islam,  August 22, 2010 3:53 PM    Prescriptions: HYDROCHLOROTHIAZIDE 25 MG TABS (HYDROCHLOROTHIAZIDE) 1 by mouth qd  #30 x 0   Entered by:   Lucious Groves CMA   Authorized by:   Nolon Rod. Paz MD   Signed by:   Lucious Groves CMA on 08/22/2010   Method used:   Electronically to        [facility] [address] [phone]* (retail)       [address]       [place], [place]  [address]       Ph: [phone]       Fax: [phone]   RxID:   [id number]

## 2010-10-10 ENCOUNTER — Telehealth: Payer: Self-pay | Admitting: Internal Medicine

## 2010-10-20 NOTE — Progress Notes (Signed)
Summary: RX  Phone Note Refill Request Call back at Home Phone [phone] Message from:  Patient on October 10, 2010 1:11 PM  Refills Requested: Medication #1:  VICODIN 5-500 MG  TABS 1 every 6 hours as needed for  back pain   Dosage confirmed as above?Dosage Confirmed   Supply Requested: 1 month Patient walk in wanting a refill--[facility]  Initial call taken by: Freddy Jaksch,  October 10, 2010 1:12 PM  Follow-up for Phone Call        ok 120 and 1 RF Jose E. Paz MD  October 10, 2010 4:56 PM     Prescriptions: VICODIN 5-500 MG  TABS (HYDROCODONE-ACETAMINOPHEN) 1 every 6 hours as needed for  back pain  #120 x 1   Entered by:   Army Fossa CMA   Authorized by:   Nolon Rod. Paz MD   Signed by:   Army Fossa CMA on 10/10/2010   Method used:   Printed then faxed to ...       [facility]  [address] [phone]* (retail)       [address]       [place], [place]  [address]       Ph: [phone]       Fax: [phone]   RxID:   [id number]

## 2010-11-10 ENCOUNTER — Other Ambulatory Visit: Payer: Self-pay | Admitting: Internal Medicine

## 2010-11-10 NOTE — Telephone Encounter (Signed)
30 and 1 RF 

## 2010-11-28 ENCOUNTER — Ambulatory Visit: Payer: Self-pay | Admitting: Pain Medicine

## 2010-12-01 ENCOUNTER — Ambulatory Visit: Payer: Self-pay | Admitting: Pain Medicine

## 2010-12-08 ENCOUNTER — Ambulatory Visit: Payer: Self-pay | Admitting: Pain Medicine

## 2010-12-13 ENCOUNTER — Ambulatory Visit: Payer: Self-pay | Admitting: Pain Medicine

## 2010-12-20 ENCOUNTER — Ambulatory Visit: Payer: Self-pay | Admitting: Pain Medicine

## 2011-01-03 ENCOUNTER — Ambulatory Visit: Payer: Self-pay | Admitting: Pain Medicine

## 2011-01-04 ENCOUNTER — Telehealth: Payer: Self-pay | Admitting: Internal Medicine

## 2011-01-04 NOTE — Telephone Encounter (Signed)
Are you ok w/ renewing handicap sticker?

## 2011-01-10 NOTE — Telephone Encounter (Signed)
Ok to redo handicap parking. She is due for a OV (CPX) please arrange

## 2011-01-11 NOTE — Telephone Encounter (Signed)
Left message for pt- will mail handicap form or place upfront for her.

## 2011-01-12 NOTE — Telephone Encounter (Signed)
Pt will pick up handicap form here.

## 2011-01-12 NOTE — Telephone Encounter (Signed)
Left message on cell and home number.

## 2011-01-29 ENCOUNTER — Other Ambulatory Visit: Payer: Self-pay | Admitting: Internal Medicine

## 2011-02-17 ENCOUNTER — Other Ambulatory Visit: Payer: Self-pay | Admitting: Internal Medicine

## 2011-04-14 ENCOUNTER — Other Ambulatory Visit: Payer: Self-pay | Admitting: Internal Medicine

## 2011-04-19 ENCOUNTER — Ambulatory Visit: Payer: Self-pay | Admitting: Pain Medicine

## 2011-04-19 ENCOUNTER — Other Ambulatory Visit: Payer: Self-pay | Admitting: Pain Medicine

## 2011-05-01 ENCOUNTER — Other Ambulatory Visit (HOSPITAL_COMMUNITY)
Admission: RE | Admit: 2011-05-01 | Discharge: 2011-05-01 | Disposition: A | Discharge status: Home / Self Care | Payer: Medicare Other | Source: Ambulatory Visit | Attending: Internal Medicine | Admitting: Internal Medicine

## 2011-05-01 ENCOUNTER — Encounter: Payer: Self-pay | Admitting: Internal Medicine

## 2011-05-01 ENCOUNTER — Ambulatory Visit (INDEPENDENT_AMBULATORY_CARE_PROVIDER_SITE_OTHER): Payer: Medicare Other | Admitting: Internal Medicine

## 2011-05-01 DIAGNOSIS — F3289 Other specified depressive episodes: Secondary | ICD-10-CM

## 2011-05-01 DIAGNOSIS — M1990 Unspecified osteoarthritis, unspecified site: Secondary | ICD-10-CM

## 2011-05-01 DIAGNOSIS — Z23 Encounter for immunization: Secondary | ICD-10-CM

## 2011-05-01 DIAGNOSIS — F329 Major depressive disorder, single episode, unspecified: Secondary | ICD-10-CM

## 2011-05-01 DIAGNOSIS — Z0000 Encounter for general adult medical examination without abnormal findings: Secondary | ICD-10-CM

## 2011-05-01 DIAGNOSIS — Z13820 Encounter for screening for osteoporosis: Secondary | ICD-10-CM

## 2011-05-01 DIAGNOSIS — E785 Hyperlipidemia, unspecified: Secondary | ICD-10-CM

## 2011-05-01 DIAGNOSIS — I10 Essential (primary) hypertension: Secondary | ICD-10-CM

## 2011-05-01 DIAGNOSIS — Z780 Asymptomatic menopausal state: Secondary | ICD-10-CM

## 2011-05-01 DIAGNOSIS — E559 Vitamin D deficiency, unspecified: Secondary | ICD-10-CM

## 2011-05-01 DIAGNOSIS — Z1211 Encounter for screening for malignant neoplasm of colon: Secondary | ICD-10-CM

## 2011-05-01 DIAGNOSIS — Z01419 Encounter for gynecological examination (general) (routine) without abnormal findings: Secondary | ICD-10-CM | POA: Insufficient documentation

## 2011-05-01 LAB — CBC WITH DIFFERENTIAL/PLATELET
Eosinophils Relative: 2.5 % (ref 0.0–5.0)
Lymphocytes Relative: 36.1 % (ref 12.0–46.0)
Monocytes Absolute: 0.3 10*3/uL (ref 0.1–1.0)
Monocytes Relative: 7.9 % (ref 3.0–12.0)
Neutrophils Relative %: 53 % (ref 43.0–77.0)
Platelets: 179 10*3/uL (ref 150.0–400.0)
WBC: 4.2 10*3/uL — ABNORMAL LOW (ref 4.5–10.5)

## 2011-05-01 LAB — LIPID PANEL
HDL: 55.6 mg/dL (ref >39.00)
LDL Cholesterol: 107 mg/dL — ABNORMAL HIGH (ref 0–99)
Total CHOL/HDL Ratio: 3
VLDL: 12.2 mg/dL (ref 0.0–40.0)

## 2011-05-01 LAB — TSH: TSH: 1.24 u[IU]/mL (ref 0.35–5.50)

## 2011-05-01 MED ORDER — HYDROCHLOROTHIAZIDE 25 MG PO TABS: 25.0000 mg | ORAL_TABLET | Freq: Every day | ORAL | Status: DC

## 2011-05-01 MED ORDER — LISINOPRIL 10 MG PO TABS: 10.0000 mg | ORAL_TABLET | Freq: Every day | ORAL | Status: DC

## 2011-05-01 MED ORDER — ZOSTER VACCINE LIVE 19400 UNT/0.65ML ~~LOC~~ SOLR: 0.6500 mL | Freq: Once | SUBCUTANEOUS | Status: DC

## 2011-05-01 NOTE — Assessment & Plan Note (Addendum)
Td 2009, flu shot today, zostavax-- Rx provided, benefits explained   never Cscope,iFOB neg 07-2009: afraid of sedation, desires a virtual colonoscopy Will schedule, aware as the test is not as sensitive as a Cscope, cost may be different and will need a cscope if found to have polyps  Bone density test, normal  04/2008 -----ordering one   vitamin D, was low, status-post supplements. On OTC Vit D, recheck vitamin D  never had an abnormal PAP, PAP sent today Last mammogram 05-2010, normal  Diet-exercise discussed

## 2011-05-01 NOTE — Assessment & Plan Note (Addendum)
Not well controlled , increase zoloft from 75 to 100mg  , will call in 2 months if not better

## 2011-05-01 NOTE — Assessment & Plan Note (Signed)
Due for labs

## 2011-05-01 NOTE — Assessment & Plan Note (Signed)
Sees pain managment, sx relatively well controlled

## 2011-05-01 NOTE — Assessment & Plan Note (Signed)
Labs

## 2011-05-01 NOTE — Progress Notes (Signed)
Subjective:    Patient ID: Wanda [name], female    DOB: [birth year], 65 y.o.   MRN: [id number]  HPI Here for Medicare AWV: 1. Risk factors based on Past M, S, F history: reviewed 2. Physical Activities: exercise limited by DJD but no sedentary 3. Depression/mood: some depression, not severe, no suicidal 4. Hearing:  No problems noted or reported 5. ADL's:  Independent, drives  6. Fall Risk: no recent problems  7. home Safety: does feel safe at home  8. Height, weight, &visual acuity: see VS, sees eye doctor routinely, ?retinal bleed  9. Counseling: provided 10. Labs ordered based on risk factors: if needed  11. Referral Coordination: if needed 12.  Care Plan, see assessment and plan  13.   Cognitive Assessment: Coordination , cognition and motor skills  In addition, today we discussed the following: DJD-- back pain relatively well controlled, sees pain specialist in [place], s/p knee local injections Depression-- see above , not well controlled, pt not sure about triggers or barriers to get sx well controlled . Good med compliance  HTN-- good med compliance    Past Medical History  Diagnosis Date  . Allergy     RHINITIS  . Hyperlipidemia   . Hypertension   . DJD (degenerative joint disease)     back pain , on disability  . Depression   . Menopause     onset age 89    Past Surgical History  Procedure Date  . G3 p3     History   Social History  . Marital Status: Married    Spouse Name: N/A    Number of Children: 3  . Years of Education: N/A   Occupational History  . ON DISABILITY    Social History Main Topics  . Smoking status: Never Smoker   . Smokeless tobacco: Never Used  . Alcohol Use: No  . Drug Use: No  . Sexually Active: Not on file   Other Topics Concern  . Not on file   Social History Narrative   Married, 3 kids, 6 G-kids   Family History  Problem Relation Age of Onset  . Cancer Sister     ??CERVICAL  . Cancer Brother     STOMACH  .  Breast cancer Maternal Aunt     aunt, cousin x 2   . Colon cancer Neg Hx   . Coronary artery disease Neg Hx   . Diabetes      aunt      Review of Systems  Respiratory: Negative for cough, shortness of breath and wheezing.   Cardiovascular: Negative for chest pain and leg swelling.  Gastrointestinal: Negative for abdominal pain and blood in stool.  Genitourinary: Negative for dysuria, hematuria, vaginal bleeding, vaginal discharge and difficulty urinating.       Objective:   Physical Exam  Constitutional: She is oriented to person, place, and time. She appears well-developed. No distress.       Overweight appearing   Neck: No thyromegaly present.       Nl carotid pulse, no bruit   Cardiovascular: Normal rate, regular rhythm and normal heart sounds.   No murmur heard. Pulmonary/Chest: Effort normal and breath sounds normal. No respiratory distress. She has no wheezes. She has no rales.  Abdominal: Soft. Bowel sounds are normal. She exhibits no distension. There is no tenderness. There is no rebound and no guarding.  Genitourinary: Guaiac negative stool.       Breast exam normal bilaterally, no LADs. External genitalia:  normal Vagina and cervix: normal Bimanual limited by pt's size but wnl DRE normal   Musculoskeletal: She exhibits no edema.  Neurological: She is alert and oriented to person, place, and time.  Skin: She is not diaphoretic.  Psychiatric: She has a normal mood and affect. Her behavior is normal. Judgment and thought content normal.       Assessment & Plan:

## 2011-05-01 NOTE — Assessment & Plan Note (Signed)
good medication compliance Labs  

## 2011-05-02 LAB — COMPLETE METABOLIC PANEL WITH GFR
ALT: 18 U/L (ref 0–35)
Alkaline Phosphatase: 79 U/L (ref 39–117)
Potassium: 4.1 mEq/L (ref 3.5–5.3)
Sodium: 141 mEq/L (ref 135–145)
Total Bilirubin: 0.4 mg/dL (ref 0.3–1.2)
Total Protein: 6.9 g/dL (ref 6.0–8.3)

## 2011-05-02 LAB — VITAMIN D 25 HYDROXY (VIT D DEFICIENCY, FRACTURES): Vit D, 25-Hydroxy: 37 ng/mL (ref 30–89)

## 2011-05-08 ENCOUNTER — Other Ambulatory Visit: Payer: Medicare Other

## 2011-05-08 ENCOUNTER — Telehealth: Payer: Self-pay | Admitting: Internal Medicine

## 2011-05-08 NOTE — Telephone Encounter (Signed)
LMOM to inform patient to call back to discuss decision as to what she would like to do.

## 2011-05-08 NOTE — Telephone Encounter (Signed)
In reference to Referral for CT Virtual Colonoscopy, per patient's insurance, this is Denied.  Not a covered benefit under her plan.  Please advise.

## 2011-05-08 NOTE — Telephone Encounter (Signed)
Duplicate

## 2011-05-08 NOTE — Telephone Encounter (Signed)
Please notify the patient on her insurance decision: Needs to pick up an IFOB and do one yearly Also, she could pay out of pocket for the virtual CT or she could be referred to GI, they could  explain to her colonoscopy and sedation in great detail

## 2011-05-11 ENCOUNTER — Ambulatory Visit
Admission: RE | Admit: 2011-05-11 | Discharge: 2011-05-11 | Disposition: A | Discharge status: Home / Self Care | Payer: Medicare Other | Source: Ambulatory Visit | Attending: Internal Medicine | Admitting: Internal Medicine

## 2011-05-11 DIAGNOSIS — Z780 Asymptomatic menopausal state: Secondary | ICD-10-CM

## 2011-05-17 ENCOUNTER — Telehealth: Payer: Self-pay

## 2011-05-17 NOTE — Telephone Encounter (Signed)
Message copied by Francisco Capuchin on Wed May 17, 2011  3:32 PM ------      Message from: Wanda Plump      Created: Mon May 15, 2011  6:17 PM       Advise patient, bone density test normal. Good results

## 2011-05-17 NOTE — Telephone Encounter (Signed)
Left message for pt to call back  °

## 2011-05-23 ENCOUNTER — Other Ambulatory Visit: Payer: Self-pay | Admitting: Internal Medicine

## 2011-05-30 ENCOUNTER — Encounter: Payer: Self-pay | Admitting: Internal Medicine

## 2011-06-07 ENCOUNTER — Other Ambulatory Visit: Payer: Self-pay | Admitting: Internal Medicine

## 2011-06-07 DIAGNOSIS — Z1231 Encounter for screening mammogram for malignant neoplasm of breast: Secondary | ICD-10-CM

## 2011-06-13 ENCOUNTER — Other Ambulatory Visit: Payer: Self-pay | Admitting: Internal Medicine

## 2011-06-14 ENCOUNTER — Ambulatory Visit
Admission: RE | Admit: 2011-06-14 | Discharge: 2011-06-14 | Disposition: A | Discharge status: Home / Self Care | Payer: Medicare Other | Source: Ambulatory Visit | Attending: Internal Medicine | Admitting: Internal Medicine

## 2011-06-14 DIAGNOSIS — Z1231 Encounter for screening mammogram for malignant neoplasm of breast: Secondary | ICD-10-CM

## 2011-08-07 ENCOUNTER — Other Ambulatory Visit: Payer: Self-pay | Admitting: Internal Medicine

## 2011-08-09 ENCOUNTER — Other Ambulatory Visit: Payer: Self-pay | Admitting: Internal Medicine

## 2011-08-24 ENCOUNTER — Telehealth: Payer: Self-pay | Admitting: Internal Medicine

## 2011-09-20 ENCOUNTER — Ambulatory Visit: Payer: Self-pay | Admitting: Pain Medicine

## 2011-09-22 ENCOUNTER — Ambulatory Visit: Payer: Self-pay | Admitting: Pain Medicine

## 2012-01-11 ENCOUNTER — Ambulatory Visit: Payer: Self-pay | Admitting: Pain Medicine

## 2012-01-12 NOTE — Telephone Encounter (Signed)
Already address my lindsey.

## 2012-02-14 ENCOUNTER — Ambulatory Visit: Payer: Self-pay | Admitting: Pain Medicine

## 2012-02-16 ENCOUNTER — Other Ambulatory Visit: Payer: Self-pay | Admitting: Internal Medicine

## 2012-02-16 NOTE — Telephone Encounter (Signed)
Refill done.  

## 2012-05-29 ENCOUNTER — Other Ambulatory Visit: Payer: Self-pay | Admitting: Internal Medicine

## 2012-05-29 DIAGNOSIS — Z1231 Encounter for screening mammogram for malignant neoplasm of breast: Secondary | ICD-10-CM

## 2012-06-24 ENCOUNTER — Other Ambulatory Visit: Payer: Self-pay | Admitting: Internal Medicine

## 2012-06-24 NOTE — Telephone Encounter (Signed)
Refill done.  

## 2012-07-15 ENCOUNTER — Other Ambulatory Visit: Payer: Self-pay | Admitting: Internal Medicine

## 2012-07-15 ENCOUNTER — Ambulatory Visit
Admission: RE | Admit: 2012-07-15 | Discharge: 2012-07-15 | Disposition: A | Discharge status: Home / Self Care | Payer: Medicare Other | Source: Ambulatory Visit | Attending: Internal Medicine | Admitting: Internal Medicine

## 2012-07-15 DIAGNOSIS — Z1231 Encounter for screening mammogram for malignant neoplasm of breast: Secondary | ICD-10-CM

## 2012-07-15 NOTE — Telephone Encounter (Signed)
Pt has not been seen within a year. OK to refill? 

## 2012-07-15 NOTE — Telephone Encounter (Signed)
Advise patient: Needs ROV, please arrange within a month. Call #30, no RF

## 2012-07-15 NOTE — Telephone Encounter (Signed)
Refill done.  Discussed with pt, schedule CPE 1.13.14 @10a .

## 2012-07-29 ENCOUNTER — Other Ambulatory Visit: Payer: Self-pay | Admitting: Internal Medicine

## 2012-07-29 DIAGNOSIS — R928 Other abnormal and inconclusive findings on diagnostic imaging of breast: Secondary | ICD-10-CM

## 2012-07-31 ENCOUNTER — Ambulatory Visit
Admission: RE | Admit: 2012-07-31 | Discharge: 2012-07-31 | Disposition: A | Discharge status: Home / Self Care | Payer: Medicare Other | Source: Ambulatory Visit | Attending: Internal Medicine | Admitting: Internal Medicine

## 2012-07-31 DIAGNOSIS — R928 Other abnormal and inconclusive findings on diagnostic imaging of breast: Secondary | ICD-10-CM

## 2012-08-05 ENCOUNTER — Ambulatory Visit (INDEPENDENT_AMBULATORY_CARE_PROVIDER_SITE_OTHER): Payer: Medicare Other | Admitting: Internal Medicine

## 2012-08-05 ENCOUNTER — Encounter: Payer: Self-pay | Admitting: Internal Medicine

## 2012-08-05 VITALS — BP 112/78 | HR 72 | Temp 98.0°F | Ht 66.0 in | Wt 261.0 lb

## 2012-08-05 DIAGNOSIS — M1990 Unspecified osteoarthritis, unspecified site: Secondary | ICD-10-CM

## 2012-08-05 DIAGNOSIS — E785 Hyperlipidemia, unspecified: Secondary | ICD-10-CM

## 2012-08-05 DIAGNOSIS — F329 Major depressive disorder, single episode, unspecified: Secondary | ICD-10-CM

## 2012-08-05 DIAGNOSIS — Z0000 Encounter for general adult medical examination without abnormal findings: Secondary | ICD-10-CM

## 2012-08-05 DIAGNOSIS — F3289 Other specified depressive episodes: Secondary | ICD-10-CM

## 2012-08-05 DIAGNOSIS — E559 Vitamin D deficiency, unspecified: Secondary | ICD-10-CM

## 2012-08-05 DIAGNOSIS — I10 Essential (primary) hypertension: Secondary | ICD-10-CM

## 2012-08-05 LAB — CBC WITH DIFFERENTIAL/PLATELET
Eosinophils Absolute: 0.1 10*3/uL (ref 0.0–0.7)
Eosinophils Relative: 2.9 % (ref 0.0–5.0)
HCT: 39.6 % (ref 36.0–46.0)
Lymphs Abs: 1.1 10*3/uL (ref 0.7–4.0)
MCHC: 33.2 g/dL (ref 30.0–36.0)
MCV: 90.7 fl (ref 78.0–100.0)
Monocytes Absolute: 0.2 10*3/uL (ref 0.1–1.0)
Neutrophils Relative %: 60.9 % (ref 43.0–77.0)
Platelets: 178 10*3/uL (ref 150.0–400.0)
RDW: 14.8 % — ABNORMAL HIGH (ref 11.5–14.6)
WBC: 3.7 10*3/uL — ABNORMAL LOW (ref 4.5–10.5)

## 2012-08-05 LAB — LIPID PANEL
LDL Cholesterol: 105 mg/dL — ABNORMAL HIGH (ref 0–99)
VLDL: 11.6 mg/dL (ref 0.0–40.0)

## 2012-08-05 LAB — ALT: ALT: 21 U/L (ref 0–35)

## 2012-08-05 MED ORDER — ROSUVASTATIN CALCIUM 20 MG PO TABS: 10.0000 mg | ORAL_TABLET | Freq: Every day | ORAL | Status: DC

## 2012-08-05 MED ORDER — SERTRALINE HCL 50 MG PO TABS: 75.0000 mg | ORAL_TABLET | Freq: Every day | ORAL | Status: DC

## 2012-08-05 MED ORDER — LISINOPRIL 10 MG PO TABS: 10.0000 mg | ORAL_TABLET | Freq: Every day | ORAL | Status: DC

## 2012-08-05 MED ORDER — HYDROCHLOROTHIAZIDE 25 MG PO TABS: 25.0000 mg | ORAL_TABLET | Freq: Every day | ORAL | Status: DC

## 2012-08-05 MED ORDER — ZOSTER VACCINE LIVE 19400 UNT/0.65ML ~~LOC~~ SOLR: 0.6500 mL | Freq: Once | SUBCUTANEOUS | Status: DC

## 2012-08-05 NOTE — Assessment & Plan Note (Addendum)
Td 2009, had a flu shot  Needs a new Rx for zostavax   never Cscope,iFOB neg 07-2009; she is afraid of sedation, last year she couldn't get a virtual colonoscopy ----> refer to GI  Bone density test, normal  04/2008 and 04-2011 History of low vitamin D, currently on no supplements. See instructions  never had an abnormal PAP, PAP 04-2011 (-) Last mammogram 06-2012 followup by negative left breast ultrasound 07/2012. Plan left diagnostic mammogram 01-2013.  Diet-exercise discussed

## 2012-08-05 NOTE — Assessment & Plan Note (Signed)
Refill medications, check FLP, AST, ALT

## 2012-08-05 NOTE — Assessment & Plan Note (Signed)
No ambulatory BPs, BP today very good. Labs and continue with same medicines

## 2012-08-05 NOTE — Assessment & Plan Note (Signed)
Sees pain management, we'll issue, parking permit.

## 2012-08-05 NOTE — Assessment & Plan Note (Signed)
Well-controlled at this point, continue with same medications

## 2012-08-05 NOTE — Progress Notes (Signed)
Subjective:    Patient ID: Wanda [name], female    DOB: 03-15-1946, 67 y.o.   MRN: [id number]  HPI Here for Medicare AWV: 1. Risk factors based on Past M, S, F history: reviewed 2. Physical Activities: going to the gym x 2/week, exercises the best she can, has pain from DJD 3. Depression/mood: (-) screening 4. Hearing:  No problems noted or reported 5. ADL's:  Independent, drives   6. Fall Risk: no recent problems  , see instructions 7. home Safety: does feel safe at home   8. Height, weight, &visual acuity: see VS, sees eye doctor routinely, ?retinal bleed   9. Counseling: provided 10. Labs ordered based on risk factors: if needed   11. Referral Coordination: if needed 12.  Care Plan, see assessment and plan   13.   Cognitive Assessment: Coordination , cognition and motor skills  In addition, today we discussed the following: DJD, sees orthopedics. Needs a parking permit. High cholesterol, good medication compliance, no apparent side effects. Hypertension, good medication compliance, not ambulatory BPs. BP today normal.  Past Medical History  Diagnosis Date  . Allergy     RHINITIS  . Hyperlipidemia   . Hypertension   . DJD (degenerative joint disease)     back pain , on disability  . Depression   . Menopause     onset age 89    Past Surgical History  Procedure Date  . G3 p3     History   Social History  . Marital Status: Married    Spouse Name: N/A    Number of Children: 3  . Years of Education: N/A   Occupational History  . ON DISABILITY    Social History Main Topics  . Smoking status: Never Smoker   . Smokeless tobacco: Never Used  . Alcohol Use: No  . Drug Use: No  . Sexually Active: Not on file   Other Topics Concern  . Not on file   Social History Narrative   Married, 3 kids, 6 G-kids ---Diet: eating more fruits and vegetables    Family History  Problem Relation Age of Onset  . Cancer Sister     ??CERVICAL  . Cancer Brother     STOMACH    . Breast cancer Sister     sister dx age 29, aunt, cousin x 2   . Colon cancer Brother     dx age ~ 75 ?  [name] Coronary artery disease Neg Hx   . Diabetes      aunt   . CAD Neg Hx    Review of Systems No chest pain or shortness of breath No nausea, vomiting, diarrhea or blood in the stools. No dysuria gross hematuria. No vaginal bleeding.    Objective:   Physical Exam General -- alert, well-developed, and well-nourished.   Neck --no thyromegaly  Breasts-- No mass, nodules, thickening, tenderness, bulging, retraction, inflamation, nipple discharge or skin changes noted.  no axillary lymph nodes Lungs -- normal respiratory effort, no intercostal retractions, no accessory muscle use, and normal breath sounds.   Heart-- normal rate, regular rhythm, no murmur, and no gallop.   Abdomen--soft, non-tender, no distention, no masses, no HSM, no guarding, and no rigidity.   Extremities-- no pretibial edema bilaterally  Neurologic-- alert & oriented X3 and strength normal in all extremities. Psych-- Cognition and judgment appear intact. Alert and cooperative with normal attention span and concentration.  not anxious appearing and not depressed appearing.  Assessment & Plan:

## 2012-08-05 NOTE — Assessment & Plan Note (Signed)
Not on any supplements at this point, see instructions

## 2012-08-05 NOTE — Patient Instructions (Signed)
Take over-the-counter vitamin D supplements, approximately 1000 units every day.   Fall Prevention and Home Safety Falls cause injuries and can affect all age groups. It is possible to use preventive measures to significantly decrease the likelihood of falls. There are many simple measures which can make your home safer and prevent falls. OUTDOORS  Repair cracks and edges of walkways and driveways.  Remove high doorway thresholds.  Trim shrubbery on the main path into your home.  Have good outside lighting.  Clear walkways of tools, rocks, debris, and clutter.  Check that handrails are not broken and are securely fastened. Both sides of steps should have handrails.  Have leaves, snow, and ice cleared regularly.  Use sand or salt on walkways during winter months.  In the garage, clean up grease or oil spills. BATHROOM  Install night lights.  Install grab bars by the toilet and in the tub and shower.  Use non-skid mats or decals in the tub or shower.  Place a plastic non-slip stool in the shower to sit on, if needed.  Keep floors dry and clean up all water on the floor immediately.  Remove soap buildup in the tub or shower on a regular basis.  Secure bath mats with non-slip, double-sided rug tape.  Remove throw rugs and tripping hazards from the floors. BEDROOMS  Install night lights.  Make sure a bedside light is easy to reach.  Do not use oversized bedding.  Keep a telephone by your bedside.  Have a firm chair with side arms to use for getting dressed.  Remove throw rugs and tripping hazards from the floor. KITCHEN  Keep handles on pots and pans turned toward the center of the stove. Use back burners when possible.  Clean up spills quickly and allow time for drying.  Avoid walking on wet floors.  Avoid hot utensils and knives.  Position shelves so they are not too high or low.  Place commonly used objects within easy reach.  If necessary, use a  sturdy step stool with a grab bar when reaching.  Keep electrical cables out of the way.  Do not use floor polish or wax that makes floors slippery. If you must use wax, use non-skid floor wax.  Remove throw rugs and tripping hazards from the floor. STAIRWAYS  Never leave objects on stairs.  Place handrails on both sides of stairways and use them. Fix any loose handrails. Make sure handrails on both sides of the stairways are as long as the stairs.  Check carpeting to make sure it is firmly attached along stairs. Make repairs to worn or loose carpet promptly.  Avoid placing throw rugs at the top or bottom of stairways, or properly secure the rug with carpet tape to prevent slippage. Get rid of throw rugs, if possible.  Have an electrician put in a light switch at the top and bottom of the stairs. OTHER FALL PREVENTION TIPS  Wear low-heel or rubber-soled shoes that are supportive and fit well. Wear closed toe shoes.  When using a stepladder, make sure it is fully opened and both spreaders are firmly locked. Do not climb a closed stepladder.  Add color or contrast paint or tape to grab bars and handrails in your home. Place contrasting color strips on first and last steps.  Learn and use mobility aids as needed. Install an electrical emergency response system.  Turn on lights to avoid dark areas. Replace light bulbs that burn out immediately. Get light switches that glow.  Arrange furniture to create clear pathways. Keep furniture in the same place.  Firmly attach carpet with non-skid or double-sided tape.  Eliminate uneven floor surfaces.  Select a carpet pattern that does not visually hide the edge of steps.  Be aware of all pets. OTHER HOME SAFETY TIPS  Set the water temperature for 120 F (48.8 C).  Keep emergency numbers on or near the telephone.  Keep smoke detectors on every level of the home and near sleeping areas. Document Released: 06/30/2002 Document Revised:  01/09/2012 Document Reviewed: 09/29/2011 [facility] Patient Information 2013 [place], [place].

## 2012-08-06 ENCOUNTER — Encounter: Payer: Self-pay | Admitting: Internal Medicine

## 2012-08-06 LAB — BASIC METABOLIC PANEL WITH GFR
Calcium: 9.7 mg/dL (ref 8.4–10.5)
GFR, Est African American: >89 mL/min
GFR, Est Non African American: >89 mL/min
Glucose, Bld: 96 mg/dL (ref 70–99)
Sodium: 137 mEq/L (ref 135–145)

## 2012-08-07 ENCOUNTER — Encounter: Payer: Self-pay | Admitting: *Deleted

## 2012-08-08 ENCOUNTER — Encounter: Payer: Self-pay | Admitting: Gastroenterology

## 2012-08-21 ENCOUNTER — Ambulatory Visit: Payer: Self-pay | Admitting: Pain Medicine

## 2012-08-29 ENCOUNTER — Ambulatory Visit: Payer: Self-pay | Admitting: Pain Medicine

## 2012-09-10 ENCOUNTER — Ambulatory Visit (AMBULATORY_SURGERY_CENTER): Payer: Medicare Other

## 2012-09-10 VITALS — Ht 64.0 in | Wt 261.0 lb

## 2012-09-10 DIAGNOSIS — Z1211 Encounter for screening for malignant neoplasm of colon: Secondary | ICD-10-CM

## 2012-09-11 ENCOUNTER — Encounter: Payer: Self-pay | Admitting: Gastroenterology

## 2012-09-18 ENCOUNTER — Ambulatory Visit (AMBULATORY_SURGERY_CENTER): Payer: Medicare Other | Admitting: Gastroenterology

## 2012-09-18 ENCOUNTER — Encounter: Payer: Self-pay | Admitting: Gastroenterology

## 2012-09-18 VITALS — BP 98/56 | HR 74 | Temp 97.3°F | Resp 21 | Ht 64.0 in | Wt 261.0 lb

## 2012-09-18 DIAGNOSIS — Z1211 Encounter for screening for malignant neoplasm of colon: Secondary | ICD-10-CM

## 2012-09-18 DIAGNOSIS — Z800 Family history of malignant neoplasm of digestive organs: Secondary | ICD-10-CM

## 2012-09-18 MED ORDER — SODIUM CHLORIDE 0.9 % IV SOLN: 500.0000 mL | INTRAVENOUS | Status: DC

## 2012-09-18 NOTE — Progress Notes (Signed)
1155 iv not functioning, checked for blood return, positive, yet unable to flush. Removed tape, catheter kinked, returned to normal, iv working well, iv retapped.

## 2012-09-18 NOTE — Op Note (Signed)
 [facility] [address] [place], [address]   COLONOSCOPY PROCEDURE REPORT  PATIENT: Wanda, [name]  MR#: [id number] BIRTHDATE: [birth year] , 66  yrs. old GENDER: Female ENDOSCOPIST: Rachael Fee, MD REFERRED XB:JYNW Drue Novel, M.D. PROCEDURE DATE:  09/18/2012 PROCEDURE:   Colonoscopy, screening ASA CLASS:   Class II INDICATIONS: elevated risk screening, brother had colon cancer MEDICATIONS: Fentanyl 75 mcg IV, Versed 6 mg IV, and These medications were titrated to patient response per physician's verbal order  DESCRIPTION OF PROCEDURE:   After the risks benefits and alternatives of the procedure were thoroughly explained, informed consent was obtained.  A digital rectal exam revealed no abnormalities of the rectum.   The Pentax Ped Colon [id number] endoscope was introduced through the anus and advanced to the cecum, which was identified by both the appendix and ileocecal valve. No adverse events experienced.   The quality of the prep was good, using MoviPrep  The instrument was then slowly withdrawn as the colon was fully examined.   COLON FINDINGS: A normal appearing cecum, ileocecal valve, and appendiceal orifice were identified.  The ascending, hepatic flexure, transverse, splenic flexure, descending, sigmoid colon and rectum appeared unremarkable.  No polyps or cancers were seen. Retroflexed views revealed no abnormalities. The time to cecum=6 minutes 55 seconds.  Withdrawal time=5 minutes 28 seconds.  The scope was withdrawn and the procedure completed. COMPLICATIONS: There were no complications.  ENDOSCOPIC IMPRESSION: Normal colon No polyps or cancers  RECOMMENDATIONS: Given your significant family history of colon cancer, you should have a repeat colonoscopy in 5 years   eSigned:  Rachael Fee, MD 09/18/2012 11:09 AM

## 2012-09-18 NOTE — Patient Instructions (Addendum)
Discharge instructions given with verbal understanding. Normal exam. Resume previous medications. YOU HAD AN ENDOSCOPIC PROCEDURE TODAY AT THE [facility]: Refer to the procedure report that was given to you for any specific questions about what was found during the examination.  If the procedure report does not answer your questions, please call your gastroenterologist to clarify.  If you requested that your care partner not be given the details of your procedure findings, then the procedure report has been included in a sealed envelope for you to review at your convenience later.  YOU SHOULD EXPECT: Some feelings of bloating in the abdomen. Passage of more gas than usual.  Walking can help get rid of the air that was put into your GI tract during the procedure and reduce the bloating. If you had a lower endoscopy (such as a colonoscopy or flexible sigmoidoscopy) you may notice spotting of blood in your stool or on the toilet paper. If you underwent a bowel prep for your procedure, then you may not have a normal bowel movement for a few days.  DIET: Your first meal following the procedure should be a light meal and then it is ok to progress to your normal diet.  A half-sandwich or bowl of soup is an example of a good first meal.  Heavy or fried foods are harder to digest and may make you feel nauseous or bloated.  Likewise meals heavy in dairy and vegetables can cause extra gas to form and this can also increase the bloating.  Drink plenty of fluids but you should avoid alcoholic beverages for 24 hours.  ACTIVITY: Your care partner should take you home directly after the procedure.  You should plan to take it easy, moving slowly for the rest of the day.  You can resume normal activity the day after the procedure however you should NOT DRIVE or use heavy machinery for 24 hours (because of the sedation medicines used during the test).    SYMPTOMS TO REPORT IMMEDIATELY: A gastroenterologist  can be reached at any hour.  During normal business hours, 8:30 AM to 5:00 PM Monday through Friday, call [phone].  After hours and on weekends, please call the GI answering service at [phone] who will take a message and have the physician on call contact you.   Following lower endoscopy (colonoscopy or flexible sigmoidoscopy):  Excessive amounts of blood in the stool  Significant tenderness or worsening of abdominal pains  Swelling of the abdomen that is new, acute  Fever of 100F or higher  FOLLOW UP: If any biopsies were taken you will be contacted by phone or by letter within the next 1-3 weeks.  Call your gastroenterologist if you have not heard about the biopsies in 3 weeks.  Our staff will call the home number listed on your records the next business day following your procedure to check on you and address any questions or concerns that you may have at that time regarding the information given to you following your procedure. This is a courtesy call and so if there is no answer at the home number and we have not heard from you through the emergency physician on call, we will assume that you have returned to your regular daily activities without incident.  SIGNATURES/CONFIDENTIALITY: You and/or your care partner have signed paperwork which will be entered into your electronic medical record.  These signatures attest to the fact that that the information above on your After Visit Summary has been reviewed   and is understood.  Full responsibility of the confidentiality of this discharge information lies with you and/or your care-partner. 

## 2012-09-19 ENCOUNTER — Telehealth: Payer: Self-pay

## 2012-09-19 NOTE — Telephone Encounter (Signed)
Left message on answering machine. 

## 2012-11-13 ENCOUNTER — Ambulatory Visit: Payer: Self-pay | Admitting: Pain Medicine

## 2012-11-15 ENCOUNTER — Ambulatory Visit: Payer: Self-pay | Admitting: Pain Medicine

## 2012-11-15 LAB — MAGNESIUM: Magnesium: 1.5 mg/dL — ABNORMAL LOW

## 2012-11-25 ENCOUNTER — Ambulatory Visit: Payer: Self-pay | Admitting: Pain Medicine

## 2012-12-10 ENCOUNTER — Ambulatory Visit: Payer: Self-pay | Admitting: Pain Medicine

## 2012-12-27 ENCOUNTER — Other Ambulatory Visit: Payer: Self-pay | Admitting: Internal Medicine

## 2012-12-27 DIAGNOSIS — N63 Unspecified lump in unspecified breast: Secondary | ICD-10-CM

## 2013-01-01 ENCOUNTER — Ambulatory Visit: Payer: Self-pay | Admitting: Pain Medicine

## 2013-01-03 ENCOUNTER — Other Ambulatory Visit: Payer: Self-pay | Admitting: Pain Medicine

## 2013-01-07 ENCOUNTER — Ambulatory Visit: Payer: Self-pay | Admitting: Pain Medicine

## 2013-01-29 ENCOUNTER — Ambulatory Visit
Admission: RE | Admit: 2013-01-29 | Discharge: 2013-01-29 | Disposition: A | Discharge status: Home / Self Care | Payer: Medicare Other | Source: Ambulatory Visit | Attending: Internal Medicine | Admitting: Internal Medicine

## 2013-01-29 ENCOUNTER — Other Ambulatory Visit: Payer: Self-pay | Admitting: Internal Medicine

## 2013-01-29 DIAGNOSIS — N63 Unspecified lump in unspecified breast: Secondary | ICD-10-CM

## 2013-02-12 ENCOUNTER — Ambulatory Visit: Payer: Self-pay | Admitting: Pain Medicine

## 2013-03-12 ENCOUNTER — Emergency Department (HOSPITAL_COMMUNITY)
Admission: EM | Admit: 2013-03-12 | Discharge: 2013-03-12 | Disposition: A | Discharge status: Home / Self Care | Payer: Medicare Other | Attending: Emergency Medicine | Admitting: Emergency Medicine

## 2013-03-12 ENCOUNTER — Encounter (HOSPITAL_COMMUNITY): Payer: Self-pay | Admitting: Emergency Medicine

## 2013-03-12 DIAGNOSIS — Z79899 Other long term (current) drug therapy: Secondary | ICD-10-CM | POA: Insufficient documentation

## 2013-03-12 DIAGNOSIS — G8929 Other chronic pain: Secondary | ICD-10-CM

## 2013-03-12 DIAGNOSIS — I10 Essential (primary) hypertension: Secondary | ICD-10-CM | POA: Insufficient documentation

## 2013-03-12 DIAGNOSIS — IMO0002 Reserved for concepts with insufficient information to code with codable children: Secondary | ICD-10-CM | POA: Insufficient documentation

## 2013-03-12 DIAGNOSIS — M545 Low back pain, unspecified: Secondary | ICD-10-CM | POA: Insufficient documentation

## 2013-03-12 DIAGNOSIS — F329 Major depressive disorder, single episode, unspecified: Secondary | ICD-10-CM | POA: Insufficient documentation

## 2013-03-12 DIAGNOSIS — Z8742 Personal history of other diseases of the female genital tract: Secondary | ICD-10-CM | POA: Insufficient documentation

## 2013-03-12 DIAGNOSIS — M5432 Sciatica, left side: Secondary | ICD-10-CM

## 2013-03-12 DIAGNOSIS — Z7982 Long term (current) use of aspirin: Secondary | ICD-10-CM | POA: Insufficient documentation

## 2013-03-12 DIAGNOSIS — F3289 Other specified depressive episodes: Secondary | ICD-10-CM | POA: Insufficient documentation

## 2013-03-12 DIAGNOSIS — Z8709 Personal history of other diseases of the respiratory system: Secondary | ICD-10-CM | POA: Insufficient documentation

## 2013-03-12 DIAGNOSIS — M5430 Sciatica, unspecified side: Secondary | ICD-10-CM | POA: Insufficient documentation

## 2013-03-12 DIAGNOSIS — E785 Hyperlipidemia, unspecified: Secondary | ICD-10-CM | POA: Insufficient documentation

## 2013-03-12 DIAGNOSIS — Z791 Long term (current) use of non-steroidal anti-inflammatories (NSAID): Secondary | ICD-10-CM | POA: Insufficient documentation

## 2013-03-12 DIAGNOSIS — Z8739 Personal history of other diseases of the musculoskeletal system and connective tissue: Secondary | ICD-10-CM | POA: Insufficient documentation

## 2013-03-12 HISTORY — DX: Dorsalgia, unspecified: M54.9

## 2013-03-12 HISTORY — DX: Other chronic pain: G89.29

## 2013-03-12 MED ORDER — PREDNISONE 10 MG PO TABS: 20.0000 mg | ORAL_TABLET | Freq: Every day | ORAL | Status: DC

## 2013-03-12 MED ORDER — HYDROMORPHONE HCL PF 2 MG/ML IJ SOLN: 2.0000 mg | Freq: Once | INTRAMUSCULAR | Status: DC

## 2013-03-12 MED ORDER — HYDROMORPHONE HCL PF 2 MG/ML IJ SOLN
2.0000 mg | Freq: Once | INTRAMUSCULAR | Status: AC
  Administered 2013-03-12: 2 mg via INTRAMUSCULAR
  Filled 2013-03-12: qty 1

## 2013-03-12 NOTE — ED Provider Notes (Signed)
CSN: [id number]     Arrival date & time 03/12/13  1350 History     None    Chief Complaint  Patient presents with  . Back Pain   (Consider location/radiation/quality/duration/timing/severity/associated sxs/prior Treatment) HPI Comments: Patient is a 67 year old female past medical history significant for chronic back pain presents to the emergency department for aggravation of her back pain that occurred after she was raking in her yard today. Patient states she felt pulling on the left side of her low back and developed severe aching and throbbing pain with radiation down left leg to foot. Patient states her home medications aren't helping her pain. States pain is worse with ambulating. Patient states she is supposed to see her chronic pain doctor tomorrow for an injection. She denies any fevers, bladder or bowel incontinence, cancer, IV drug use.  Patient is a 67 y.o. female presenting with back pain.  Back Pain Associated symptoms: no chest pain, no fever and no headaches     Past Medical History  Diagnosis Date  . Allergy     RHINITIS  . Hyperlipidemia   . Hypertension   . DJD (degenerative joint disease)     back pain , on disability  . Depression   . Menopause     onset age 49   . Chronic back pain    Past Surgical History  Procedure Laterality Date  . G3 p3      Family History  Problem Relation Age of Onset  . Cancer Sister     ??CERVICAL  . Cancer Brother     STOMACH  . Breast cancer Sister     sister dx age 26, aunt, cousin x 2   . Colon cancer Brother     dx age ~ 55 ?  [name] Coronary artery disease Neg Hx   . Diabetes      aunt   . CAD Neg Hx    History  Substance Use Topics  . Smoking status: Never Smoker   . Smokeless tobacco: Never Used  . Alcohol Use: No   OB History   Grav Para Term Preterm Abortions TAB SAB Ect Mult Living                 Review of Systems  Constitutional: Negative for fever and chills.  HENT: Negative for neck pain.    Respiratory: Negative for shortness of breath.   Cardiovascular: Negative for chest pain.  Musculoskeletal: Positive for back pain.  Neurological: Negative for headaches.    Allergies  Simvastatin  Home Medications   Current Outpatient Rx  Name  Route  Sig  Dispense  Refill  . aspirin EC 81 MG tablet   Oral   Take 81 mg by mouth daily.         . Cholecalciferol (VITAMIN D) 2000 UNITS tablet   Oral   Take 2,000 Units by mouth daily.         . cyclobenzaprine (FLEXERIL) 10 MG tablet   Oral   Take 10 mg by mouth 2 (two) times daily as needed for muscle spasms.          . hydrochlorothiazide (HYDRODIURIL) 25 MG tablet   Oral   Take 1 tablet (25 mg total) by mouth daily.   30 tablet   12   . HYDROcodone-acetaminophen (VICODIN) 5-500 MG per tablet   Oral   Take 1 tablet by mouth every 6 (six) hours as needed for pain.          [name]  lisinopril (PRINIVIL,ZESTRIL) 10 MG tablet   Oral   Take 1 tablet (10 mg total) by mouth daily.   30 tablet   12   . magnesium oxide (MAG-OX) 400 MG tablet   Oral   Take 800 mg by mouth daily.         . meloxicam (MOBIC) 15 MG tablet   Oral   Take 15 mg by mouth daily.         . rosuvastatin (CRESTOR) 20 MG tablet   Oral   Take 0.5 tablets (10 mg total) by mouth daily.   30 tablet   6   . sertraline (ZOLOFT) 50 MG tablet   Oral   Take 1.5 tablets (75 mg total) by mouth daily.   45 tablet   6   . predniSONE (DELTASONE) 10 MG tablet   Oral   Take 2 tablets (20 mg total) by mouth daily.   15 tablet   0    BP 112/74  Pulse 99  Temp(Src) 97.8 F (36.6 C) (Oral)  Resp 18  SpO2 100% Physical Exam  Constitutional: She is oriented to person, place, and time. She appears well-developed and well-nourished. No distress.  HENT:  Head: Normocephalic and atraumatic.  Right Ear: External ear normal.  Left Ear: External ear normal.  Nose: Nose normal.  Eyes: Conjunctivae are normal.  Neck: Normal range of motion. Neck  supple.  Cardiovascular: Normal rate, regular rhythm, normal heart sounds and intact distal pulses.   Pulmonary/Chest: Effort normal and breath sounds normal. No respiratory distress.  Abdominal: Soft.  Musculoskeletal: She exhibits no edema.       Lumbar back: She exhibits tenderness. She exhibits normal range of motion, no bony tenderness, no swelling, no edema and no deformity.  Neurological: She is alert and oriented to person, place, and time. She has normal strength. No sensory deficit.  Skin: Skin is warm and dry. No rash noted. She is not diaphoretic.  Psychiatric: She has a normal mood and affect.    ED Course   Procedures (including critical care time)  Labs Reviewed - No data to display No results found. 1. Chronic back pain   2. Sciatica, left     MDM  No new trauma or major mechanism for injury. No neurological deficits and normal neuro exam.  Patient can walk but states is painful.  No loss of bowel or bladder control.  No concern for cauda equina.  No fever, night sweats, weight loss, h/o cancer, IVDU.  RICE protocol and pain medicine alternative reviewed with the patient. Discussed with pt that since earlier visit showed no fractures, subluxation, or other serious osseous lesions, that pain is likely as a result of soft tissue injury sustained during initial injury. A treatment plan including prednisone for sciatica was discussed and pt agreed.  I discussed, again, to keep follow up with the specialists referred to during previous visits and that therapeutic exercise might be beneficial. Patient is agreeable to plan. Patient is stable at time of discharge      Jeannetta Ellis, PA-C 03/13/13 1478

## 2013-03-12 NOTE — ED Notes (Signed)
Pt states that she has chronic back pain.  States that she was raking today and she felt like she aggravated her back.

## 2013-03-13 ENCOUNTER — Ambulatory Visit: Payer: Self-pay | Admitting: Pain Medicine

## 2013-03-13 NOTE — ED Provider Notes (Signed)
Medical screening examination/treatment/procedure(s) were performed by non-physician practitioner and as supervising physician I was immediately available for consultation/collaboration.    Celene Kras, MD 03/13/13 1536

## 2013-04-04 ENCOUNTER — Ambulatory Visit: Payer: Self-pay | Admitting: Pain Medicine

## 2013-05-07 ENCOUNTER — Ambulatory Visit (INDEPENDENT_AMBULATORY_CARE_PROVIDER_SITE_OTHER): Payer: Medicare Other

## 2013-05-07 DIAGNOSIS — Z23 Encounter for immunization: Secondary | ICD-10-CM

## 2013-05-22 ENCOUNTER — Ambulatory Visit: Payer: Self-pay | Admitting: Pain Medicine

## 2013-06-05 ENCOUNTER — Ambulatory Visit: Payer: Self-pay | Admitting: Pain Medicine

## 2013-06-25 ENCOUNTER — Ambulatory Visit: Payer: Self-pay | Admitting: Pain Medicine

## 2013-06-27 ENCOUNTER — Other Ambulatory Visit: Payer: Self-pay | Admitting: Internal Medicine

## 2013-06-27 DIAGNOSIS — N6320 Unspecified lump in the left breast, unspecified quadrant: Secondary | ICD-10-CM

## 2013-07-09 ENCOUNTER — Ambulatory Visit
Admission: RE | Admit: 2013-07-09 | Discharge: 2013-07-09 | Disposition: A | Discharge status: Home / Self Care | Payer: Medicare Other | Source: Ambulatory Visit | Attending: Internal Medicine | Admitting: Internal Medicine

## 2013-07-09 DIAGNOSIS — N6320 Unspecified lump in the left breast, unspecified quadrant: Secondary | ICD-10-CM

## 2013-08-22 ENCOUNTER — Other Ambulatory Visit: Payer: Self-pay | Admitting: Internal Medicine

## 2013-08-25 ENCOUNTER — Telehealth: Payer: Self-pay | Admitting: Internal Medicine

## 2013-08-25 NOTE — Telephone Encounter (Signed)
Please advise 

## 2013-08-25 NOTE — Telephone Encounter (Signed)
08-25-2013  Pt came by today to find out if she can take Tunric Root and [employer] with the medications she is currently on.  Please advise.

## 2013-08-25 NOTE — Telephone Encounter (Signed)
I'm not familiar with those supplements, the only  things I recommend are: Calcium 1 gr a day  vitamin D 600 units to 1000 units daily 1 multivitamin daily

## 2013-08-26 ENCOUNTER — Ambulatory Visit: Payer: Self-pay | Admitting: Pain Medicine

## 2013-08-26 NOTE — Telephone Encounter (Signed)
Called and left message for patient to return call. JG//CMA

## 2013-12-25 ENCOUNTER — Ambulatory Visit: Payer: Self-pay | Admitting: Pain Medicine

## 2014-01-02 ENCOUNTER — Other Ambulatory Visit: Payer: Self-pay | Admitting: Internal Medicine

## 2014-01-14 ENCOUNTER — Ambulatory Visit: Payer: Self-pay | Admitting: Pain Medicine

## 2014-01-15 ENCOUNTER — Encounter: Payer: Self-pay | Admitting: Internal Medicine

## 2014-01-15 ENCOUNTER — Ambulatory Visit (INDEPENDENT_AMBULATORY_CARE_PROVIDER_SITE_OTHER): Payer: Medicare Other | Admitting: Internal Medicine

## 2014-01-15 VITALS — BP 102/69 | HR 90 | Temp 97.8°F | Wt 254.0 lb

## 2014-01-15 DIAGNOSIS — I10 Essential (primary) hypertension: Secondary | ICD-10-CM

## 2014-01-15 DIAGNOSIS — F329 Major depressive disorder, single episode, unspecified: Secondary | ICD-10-CM

## 2014-01-15 DIAGNOSIS — E785 Hyperlipidemia, unspecified: Secondary | ICD-10-CM

## 2014-01-15 DIAGNOSIS — F3289 Other specified depressive episodes: Secondary | ICD-10-CM

## 2014-01-15 LAB — LIPID PANEL
Cholesterol: 262 mg/dL — ABNORMAL HIGH (ref 0–200)
HDL: 57.3 mg/dL (ref >39.00)
LDL Cholesterol: 189 mg/dL — ABNORMAL HIGH (ref 0–99)
NONHDL: 204.7
TRIGLYCERIDES: 78 mg/dL (ref 0.0–149.0)
Total CHOL/HDL Ratio: 5
VLDL: 15.6 mg/dL (ref 0.0–40.0)

## 2014-01-15 LAB — COMPREHENSIVE METABOLIC PANEL
ALT: 20 U/L (ref 0–35)
AST: 21 U/L (ref 0–37)
Albumin: 3.9 g/dL (ref 3.5–5.2)
Alkaline Phosphatase: 68 U/L (ref 39–117)
BILIRUBIN TOTAL: 0.5 mg/dL (ref 0.2–1.2)
BUN: 14 mg/dL (ref 6–23)
CO2: 29 meq/L (ref 19–32)
CREATININE: 0.7 mg/dL (ref 0.4–1.2)
Calcium: 9.9 mg/dL (ref 8.4–10.5)
Chloride: 103 mEq/L (ref 96–112)
GFR: 95.36 mL/min (ref >60.00)
Glucose, Bld: 84 mg/dL (ref 70–99)
Potassium: 3.5 mEq/L (ref 3.5–5.1)
Sodium: 138 mEq/L (ref 135–145)
Total Protein: 7.3 g/dL (ref 6.0–8.3)

## 2014-01-15 MED ORDER — HYDROCHLOROTHIAZIDE 25 MG PO TABS: ORAL_TABLET | ORAL | Status: DC

## 2014-01-15 MED ORDER — LISINOPRIL 10 MG PO TABS: ORAL_TABLET | ORAL | Status: DC

## 2014-01-15 MED ORDER — SERTRALINE HCL 50 MG PO TABS: ORAL_TABLET | ORAL | Status: DC

## 2014-01-15 NOTE — Patient Instructions (Signed)
Get your blood work before you leave   Next visit is for a physical exam in 2 months   , fasting Please make an appointment     At some point this year the clinic will relocate to  [place] and [address] (10 minutes form here)  [address]  [place], [place] [address] [phone]

## 2014-01-15 NOTE — Progress Notes (Signed)
Subjective:    Patient ID: Wanda [name], female    DOB: 11-02-1945, 68 y.o.   MRN: [id number]  DOS:  01/15/2014 Type of  Visit: routine,   last OV 07-2012 History: Hyperlipidemia, she forgot taking Crestor and eventually quit several months ago. Would like labs and restart Crestor if needed Hypertension, good medication compliance, no recent ambulatory BPs but usually when she goes to the doctor her BPs are normal Depression, on Zoloft, good symptom control, needs a refill. Back pain, still an issue, had a local ejection this week   ROS Denies chest pain or difficulty breathing. Rarely has lower extremity edema usually if she sits for prolonged periods of time. Denies nausea, vomiting, diarrhea or blood in the stools. Diet and exercise: Trying to improve, doing water aerobics, has lost a few pounds  Past Medical History  Diagnosis Date  . Allergy     RHINITIS  . Hyperlipidemia   . Hypertension   . DJD (degenerative joint disease)     back pain , on disability  . Depression   . Menopause     onset age 70   . Chronic back pain     Past Surgical History  Procedure Laterality Date  . G3 p3       History   Social History  . Marital Status: Married    Spouse Name: N/A    Number of Children: 3  . Years of Education: N/A   Occupational History  . ON DISABILITY    Social History Main Topics  . Smoking status: Never Smoker   . Smokeless tobacco: Never Used  . Alcohol Use: No  . Drug Use: No  . Sexual Activity: Not on file   Other Topics Concern  . Not on file   Social History Narrative   Married, 3 kids, 6 G-kids         Medication List       This list is accurate as of: 01/15/14  4:00 PM.  Always use your most recent med list.               aspirin EC 81 MG tablet  Take 81 mg by mouth daily.     cyclobenzaprine 10 MG tablet  Commonly known as:  FLEXERIL  Take 10 mg by mouth 2 (two) times daily as needed for muscle spasms.     gabapentin 100 MG  capsule  Commonly known as:  NEURONTIN  Take 100 mg by mouth 3 (three) times daily.     hydrochlorothiazide 25 MG tablet  Commonly known as:  HYDRODIURIL  Take one tablet daily. Please schedule office visit for additional refills     HYDROcodone-acetaminophen 5-500 MG per tablet  Commonly known as:  VICODIN  Take 1 tablet by mouth every 6 (six) hours as needed for pain.     lisinopril 10 MG tablet  Commonly known as:  PRINIVIL,ZESTRIL  take 1 tablet by mouth once daily     magnesium oxide 400 MG tablet  Commonly known as:  MAG-OX  Take 800 mg by mouth daily.     meloxicam 15 MG tablet  Commonly known as:  MOBIC  Take 15 mg by mouth daily.     sertraline 50 MG tablet  Commonly known as:  ZOLOFT  take 1.5 tablets by mouth once daily     Vitamin D 2000 UNITS tablet  Take 2,000 Units by mouth daily.           Objective:  Physical Exam BP 102/69  Pulse 90  Temp(Src) 97.8 F (36.6 C)  Wt 254 lb (115.214 kg)  SpO2 97%  General -- alert, well-developed, NAD.  HEENT-- Not pale.   Lungs -- normal respiratory effort, no intercostal retractions, no accessory muscle use, and normal breath sounds.  Heart-- normal rate, regular rhythm, no murmur.   Extremities-- no pretibial edema bilaterally  Neurologic--  alert & oriented X3. Speech normal, gait appropriate for age, strength symmetric and appropriate for age.   Psych-- Cognition and judgment appear intact. Cooperative with normal attention span and concentration. No anxious or depressed appearing.      Assessment & Plan:

## 2014-01-15 NOTE — Assessment & Plan Note (Signed)
Was taking Crestor 20 mg half tablet daily however she frequently forgot to take it and eventually quit several months ago. Plan: Labs and consider restart Crestor.

## 2014-01-15 NOTE — Assessment & Plan Note (Signed)
Chart and pertinent labs reviewed  Doing well, refill medications, check labs

## 2014-01-15 NOTE — Assessment & Plan Note (Signed)
Symptoms well-controlled,Needs a refill.

## 2014-01-15 NOTE — Progress Notes (Signed)
Pre visit review using our clinic review tool, if applicable. No additional management support is needed unless otherwise documented below in the visit note. 

## 2014-01-16 ENCOUNTER — Telehealth: Payer: Self-pay | Admitting: Internal Medicine

## 2014-01-16 NOTE — Telephone Encounter (Signed)
Relevant patient education assigned to patient using Emmi. ° °

## 2014-01-20 MED ORDER — ROSUVASTATIN CALCIUM 20 MG PO TABS: 10.0000 mg | ORAL_TABLET | Freq: Every day | ORAL | Status: DC

## 2014-01-20 NOTE — Addendum Note (Signed)
Addended by: Peggyann Shoals on: 01/20/2014 05:04 PM   Modules accepted: Orders

## 2014-01-21 ENCOUNTER — Telehealth: Payer: Self-pay | Admitting: Internal Medicine

## 2014-01-21 MED ORDER — PRAVASTATIN SODIUM 40 MG PO TABS: 40.0000 mg | ORAL_TABLET | Freq: Every day | ORAL | Status: DC

## 2014-01-21 NOTE — Telephone Encounter (Signed)
Caller name: [place] Relation to pt: Call back [phone] Pharmacy: [address]   Reason for call:  Pt states the RX crestor is too expensive.  Is there anything comparable or cheaper.

## 2014-01-21 NOTE — Telephone Encounter (Signed)
Done

## 2014-01-21 NOTE — Telephone Encounter (Signed)
Switch to Pravachol 40 mg one by mouth each bedtime #30 and 3 refills

## 2014-02-05 ENCOUNTER — Ambulatory Visit: Payer: Self-pay | Admitting: Pain Medicine

## 2014-02-20 ENCOUNTER — Ambulatory Visit: Payer: Self-pay | Admitting: Pain Medicine

## 2014-04-16 ENCOUNTER — Telehealth: Payer: Self-pay

## 2014-04-16 NOTE — Telephone Encounter (Signed)
error 

## 2014-06-05 ENCOUNTER — Ambulatory Visit: Payer: Self-pay | Admitting: Pain Medicine

## 2014-07-03 ENCOUNTER — Ambulatory Visit: Payer: Medicare Other | Admitting: Internal Medicine

## 2014-08-24 ENCOUNTER — Other Ambulatory Visit: Payer: Self-pay

## 2014-08-24 DIAGNOSIS — Z1231 Encounter for screening mammogram for malignant neoplasm of breast: Secondary | ICD-10-CM

## 2014-09-16 DIAGNOSIS — M488X6 Other specified spondylopathies, lumbar region: Secondary | ICD-10-CM | POA: Diagnosis not present

## 2014-09-16 DIAGNOSIS — M25569 Pain in unspecified knee: Secondary | ICD-10-CM | POA: Diagnosis not present

## 2014-09-16 DIAGNOSIS — M25559 Pain in unspecified hip: Secondary | ICD-10-CM | POA: Diagnosis not present

## 2014-09-16 DIAGNOSIS — E668 Other obesity: Secondary | ICD-10-CM | POA: Diagnosis not present

## 2014-09-16 DIAGNOSIS — Z79899 Other long term (current) drug therapy: Secondary | ICD-10-CM | POA: Diagnosis not present

## 2014-09-16 DIAGNOSIS — F1120 Opioid dependence, uncomplicated: Secondary | ICD-10-CM | POA: Diagnosis not present

## 2014-09-16 DIAGNOSIS — G8929 Other chronic pain: Secondary | ICD-10-CM | POA: Diagnosis not present

## 2014-09-16 DIAGNOSIS — M545 Low back pain: Secondary | ICD-10-CM | POA: Diagnosis not present

## 2014-09-18 ENCOUNTER — Ambulatory Visit
Admission: RE | Admit: 2014-09-18 | Discharge: 2014-09-18 | Disposition: A | Discharge status: Home / Self Care | Payer: Commercial Managed Care - HMO | Source: Ambulatory Visit

## 2014-09-18 DIAGNOSIS — Z1231 Encounter for screening mammogram for malignant neoplasm of breast: Secondary | ICD-10-CM

## 2014-09-18 LAB — HM MAMMOGRAPHY

## 2014-11-04 ENCOUNTER — Encounter: Payer: Self-pay | Admitting: Internal Medicine

## 2014-11-04 ENCOUNTER — Ambulatory Visit (INDEPENDENT_AMBULATORY_CARE_PROVIDER_SITE_OTHER): Payer: Commercial Managed Care - HMO | Admitting: Internal Medicine

## 2014-11-04 VITALS — BP 130/78 | HR 90 | Temp 97.7°F | Ht 66.0 in | Wt 247.5 lb

## 2014-11-04 DIAGNOSIS — J209 Acute bronchitis, unspecified: Secondary | ICD-10-CM | POA: Diagnosis not present

## 2014-11-04 MED ORDER — AMOXICILLIN 500 MG PO CAPS: 1000.0000 mg | ORAL_CAPSULE | Freq: Two times a day (BID) | ORAL | Status: DC

## 2014-11-04 NOTE — Progress Notes (Signed)
Subjective:    Patient ID: Wanda [name], female    DOB: January 14, 1946, 69 y.o.   MRN: [id number]  DOS:  11/04/2014 Type of visit - description : Acute visit Interval history: Symptoms started 4 days ago with cough and runny nose. Not taking any medication.    Review of Systems + Subjective fever and chills. No sore throat per se, some pain with cough. No shortness of breath some  nausea with no vomiting, diarrhea blood in the stools. No sputum production  Past Medical History  Diagnosis Date  . Allergy     RHINITIS  . Hyperlipidemia   . Hypertension   . DJD (degenerative joint disease)     back pain , on disability  . Depression   . Menopause     onset age 22   . Chronic back pain     Past Surgical History  Procedure Laterality Date  . G3 p3       History   Social History  . Marital Status: Married    Spouse Name: N/A  . Number of Children: 3  . Years of Education: N/A   Occupational History  . ON DISABILITY    Social History Main Topics  . Smoking status: Never Smoker   . Smokeless tobacco: Never Used  . Alcohol Use: No  . Drug Use: No  . Sexual Activity: Not on file   Other Topics Concern  . Not on file   Social History Narrative   Married, 3 kids, 6 G-kids         Medication List       This list is accurate as of: 11/04/14 11:59 PM.  Always use your most recent med list.               amoxicillin 500 MG capsule  Commonly known as:  AMOXIL  Take 2 capsules (1,000 mg total) by mouth 2 (two) times daily.     aspirin EC 81 MG tablet  Take 81 mg by mouth daily.     cyclobenzaprine 10 MG tablet  Commonly known as:  FLEXERIL  Take 10 mg by mouth 2 (two) times daily as needed for muscle spasms.     gabapentin 100 MG capsule  Commonly known as:  NEURONTIN  Take 100 mg by mouth 3 (three) times daily.     hydrochlorothiazide 25 MG tablet  Commonly known as:  HYDRODIURIL  Take one tablet daily. Please schedule office visit for  additional refills     HYDROcodone-acetaminophen 5-500 MG per tablet  Commonly known as:  VICODIN  Take 1 tablet by mouth every 6 (six) hours as needed for pain.     lisinopril 10 MG tablet  Commonly known as:  PRINIVIL,ZESTRIL  take 1 tablet by mouth once daily     magnesium oxide 400 MG tablet  Commonly known as:  MAG-OX  Take 800 mg by mouth daily.     meloxicam 15 MG tablet  Commonly known as:  MOBIC  Take 15 mg by mouth daily.     pravastatin 40 MG tablet  Commonly known as:  PRAVACHOL  Take 1 tablet (40 mg total) by mouth at bedtime.     sertraline 50 MG tablet  Commonly known as:  ZOLOFT  take 1.5 tablets by mouth once daily     Vitamin D 2000 UNITS tablet  Take 2,000 Units by mouth daily.           Objective:   Physical Exam  BP 130/78 mmHg  Pulse 90  Temp(Src) 97.7 F (36.5 C) (Oral)  Ht 5\' 6"  (1.676 m)  Wt 247 lb 8 oz (112.265 kg)  BMI 39.97 kg/m2  SpO2 98% General:   Well developed, well nourished . NAD.  HEENT:  Normocephalic . Face symmetric, atraumatic TMs normal, nose congested, throat without redness or discharge Lungs:  few rhonchi, no wheezing Normal respiratory effort, no intercostal retractions, no accessory muscle use. Heart: RRR,  no murmur.  Muscle skeletal: no pretibial edema bilaterally  Skin: Not pale. Not jaundice Neurologic:  alert & oriented X3.  Speech normal, gait appropriate for age and unassisted Psych--  Cognition and judgment appear intact.  Cooperative with normal attention span and concentration.  Behavior appropriate. No anxious or depressed appearing.       Assessment & Plan:    Symptoms consistent with bronchitis, see instructions

## 2014-11-04 NOTE — Patient Instructions (Signed)
Rest, fluids , tylenol  Take Mucinex DM twice a day as needed until better  Ok to take hydrocodone for cough if needed   OTC Nasocort or Flonase : 2 nasal sprays on each side of the nose daily until you feel better   Take the antibiotic as prescribed  (Amoxicillin) Call if not gradually better over the next  10 days Call anytime if the symptoms are severe  Please schedule a routine visit within a month

## 2014-11-04 NOTE — Progress Notes (Signed)
Pre visit review using our clinic review tool, if applicable. No additional management support is needed unless otherwise documented below in the visit note. 

## 2014-11-12 ENCOUNTER — Telehealth: Payer: Self-pay | Admitting: Internal Medicine

## 2014-11-12 DIAGNOSIS — M549 Dorsalgia, unspecified: Secondary | ICD-10-CM

## 2014-11-12 NOTE — Telephone Encounter (Signed)
Referral placed to Dr. Dossie Arbour.

## 2014-11-12 NOTE — Telephone Encounter (Signed)
Caller name: Nolita Relation to pt: self Call back number: [phone] Pharmacy:  Reason for call:   Now has [employer]. Needs referral placed to continue seeing Dr. Daleen Squibb in [place]. Appointment on 11/20/14

## 2014-11-12 NOTE — Telephone Encounter (Signed)
Please advise. I believe Dr. Estrella Deeds is pain management?

## 2014-11-12 NOTE — Telephone Encounter (Signed)
Yes, he is. Dx back pain

## 2014-11-13 NOTE — Telephone Encounter (Signed)
Insurance auth # [id number], pt aware

## 2014-11-19 DIAGNOSIS — F1120 Opioid dependence, uncomplicated: Secondary | ICD-10-CM | POA: Diagnosis not present

## 2014-11-19 DIAGNOSIS — M25569 Pain in unspecified knee: Secondary | ICD-10-CM | POA: Diagnosis not present

## 2014-11-19 DIAGNOSIS — Z79891 Long term (current) use of opiate analgesic: Secondary | ICD-10-CM | POA: Diagnosis not present

## 2014-11-19 DIAGNOSIS — M545 Low back pain: Secondary | ICD-10-CM | POA: Diagnosis not present

## 2014-11-19 DIAGNOSIS — G8929 Other chronic pain: Secondary | ICD-10-CM | POA: Diagnosis not present

## 2014-11-19 DIAGNOSIS — M25559 Pain in unspecified hip: Secondary | ICD-10-CM | POA: Diagnosis not present

## 2014-11-23 ENCOUNTER — Other Ambulatory Visit: Payer: Self-pay | Admitting: Internal Medicine

## 2015-01-29 ENCOUNTER — Other Ambulatory Visit: Payer: Self-pay

## 2015-01-29 ENCOUNTER — Other Ambulatory Visit: Payer: Self-pay | Admitting: Internal Medicine

## 2015-02-01 ENCOUNTER — Ambulatory Visit (INDEPENDENT_AMBULATORY_CARE_PROVIDER_SITE_OTHER): Payer: Commercial Managed Care - HMO | Admitting: Internal Medicine

## 2015-02-01 ENCOUNTER — Encounter: Payer: Self-pay | Admitting: Internal Medicine

## 2015-02-01 VITALS — BP 124/68 | HR 65 | Temp 97.5°F | Ht 66.0 in | Wt 244.0 lb

## 2015-02-01 DIAGNOSIS — I10 Essential (primary) hypertension: Secondary | ICD-10-CM | POA: Diagnosis not present

## 2015-02-01 DIAGNOSIS — F329 Major depressive disorder, single episode, unspecified: Secondary | ICD-10-CM

## 2015-02-01 DIAGNOSIS — F32A Depression, unspecified: Secondary | ICD-10-CM

## 2015-02-01 DIAGNOSIS — E559 Vitamin D deficiency, unspecified: Secondary | ICD-10-CM | POA: Diagnosis not present

## 2015-02-01 DIAGNOSIS — R252 Cramp and spasm: Secondary | ICD-10-CM | POA: Insufficient documentation

## 2015-02-01 DIAGNOSIS — E785 Hyperlipidemia, unspecified: Secondary | ICD-10-CM | POA: Diagnosis not present

## 2015-02-01 DIAGNOSIS — Z0000 Encounter for general adult medical examination without abnormal findings: Secondary | ICD-10-CM

## 2015-02-01 LAB — CBC WITH DIFFERENTIAL/PLATELET
BASOS PCT: 0.5 % (ref 0.0–3.0)
Basophils Absolute: 0 10*3/uL (ref 0.0–0.1)
EOS PCT: 2.7 % (ref 0.0–5.0)
Eosinophils Absolute: 0.1 10*3/uL (ref 0.0–0.7)
HCT: 41.3 % (ref 36.0–46.0)
HEMOGLOBIN: 13.8 g/dL (ref 12.0–15.0)
LYMPHS ABS: 1.7 10*3/uL (ref 0.7–4.0)
LYMPHS PCT: 39.5 % (ref 12.0–46.0)
MCHC: 33.3 g/dL (ref 30.0–36.0)
MCV: 91.4 fl (ref 78.0–100.0)
MONO ABS: 0.3 10*3/uL (ref 0.1–1.0)
MONOS PCT: 7.8 % (ref 3.0–12.0)
NEUTROS ABS: 2.1 10*3/uL (ref 1.4–7.7)
Neutrophils Relative %: 49.5 % (ref 43.0–77.0)
PLATELETS: 188 10*3/uL (ref 150.0–400.0)
RBC: 4.51 Mil/uL (ref 3.87–5.11)
RDW: 15.4 % (ref 11.5–15.5)
WBC: 4.2 10*3/uL (ref 4.0–10.5)

## 2015-02-01 LAB — BASIC METABOLIC PANEL
BUN: 17 mg/dL (ref 6–23)
CO2: 30 mEq/L (ref 19–32)
Calcium: 9.8 mg/dL (ref 8.4–10.5)
Chloride: 103 mEq/L (ref 96–112)
Creatinine, Ser: 0.68 mg/dL (ref 0.40–1.20)
GFR: 111.12 mL/min (ref >60.00)
GLUCOSE: 89 mg/dL (ref 70–99)
POTASSIUM: 4.2 meq/L (ref 3.5–5.1)
SODIUM: 137 meq/L (ref 135–145)

## 2015-02-01 LAB — LIPID PANEL
CHOLESTEROL: 241 mg/dL — AB (ref 0–200)
HDL: 54 mg/dL (ref >39.00)
LDL CALC: 175 mg/dL — AB (ref 0–99)
NonHDL: 187
Total CHOL/HDL Ratio: 4
Triglycerides: 59 mg/dL (ref 0.0–149.0)
VLDL: 11.8 mg/dL (ref 0.0–40.0)

## 2015-02-01 LAB — TSH: TSH: 1.28 u[IU]/mL (ref 0.35–4.50)

## 2015-02-01 LAB — AST: AST: 19 U/L (ref 0–37)

## 2015-02-01 LAB — MAGNESIUM: Magnesium: 1.9 mg/dL (ref 1.5–2.5)

## 2015-02-01 LAB — ALT: ALT: 19 U/L (ref 0–35)

## 2015-02-01 MED ORDER — HYDROCHLOROTHIAZIDE 25 MG PO TABS: 25.0000 mg | ORAL_TABLET | Freq: Every day | ORAL | Status: DC

## 2015-02-01 NOTE — Progress Notes (Signed)
Subjective:    Patient ID: Wanda [name], female    DOB: Jul 12, 1946, 69 y.o.   MRN: [id number]  DOS:  02/01/2015 Type of visit - description :   Here for Medicare AWV:  1. Risk factors based on Past M, S, F history: reviewed 2. Physical Activities:  Water aerobics 3. Depression/mood: treated w/ SSRI, mood ok  4. Hearing:  No problems noted or reported  5. ADL's: independent, drives  6. Fall Risk: no recent falls, prevention discussed , see AVS 7. home Safety: does feel safe at home  8. Height, weight, & visual acuity: see VS, due to see the eye doctor , plans to call 9. Counseling: provided 10. Labs ordered based on risk factors: if needed  11. Referral Coordination: if needed 12. Care Plan, see assessment and plan , written personalized plan provided , see AVS 13. Cognitive Assessment: motor skills and cognition appropriate for age 56. Care team updated, sees Dr Dossie Arbour for pain mngmt 15. End-of-life care discussed   In addition, today we discussed the following: Hypertension, good compliance with medication, reportedly ambulatory BPs okay High cholesterol, was taking Pravachol but had to stop, she felt that her nocturnal leg cramps increase. Continue with on and off nocturnal leg cramps, worse on the right.   Review of Systems  Constitutional: No fever. No chills. No unexplained wt changes. No unusual sweats  HEENT: No dental problems, no ear discharge, no facial swelling, no voice changes. No eye discharge, no eye  redness , no  intolerance to light   Respiratory: No wheezing , no  difficulty breathing. No cough , no mucus production  Cardiovascular: No CP, no leg swelling , no  Palpitations  GI: no nausea, no vomiting, no diarrhea , no  abdominal pain.  No blood in the stools. No dysphagia, no odynophagia    Endocrine: No polyphagia, no polyuria , no polydipsia  GU: No dysuria, gross hematuria, difficulty urinating. No urinary urgency, no  frequency.  Musculoskeletal: No joint swellings or unusual aches or pains  Skin: No change in the color of the skin, palor , no  Rash  Allergic, immunologic: No environmental allergies , no  food allergies  Neurological: No dizziness no  syncope. No headaches. No diplopia, no slurred, no slurred speech, no motor deficits, no facial  Numbness  Hematological: No enlarged lymph nodes, no easy bruising , no unusual bleedings  Psychiatry: No suicidal ideas, no hallucinations, no beavior problems, no confusion.    Past Medical History  Diagnosis Date  . Allergy     RHINITIS  . Hyperlipidemia   . Hypertension   . DJD (degenerative joint disease)     back pain , on disability  . Depression   . Menopause     onset age 31   . Chronic back pain     Past Surgical History  Procedure Laterality Date  . G3 p3       History   Social History  . Marital Status: Married    Spouse Name: N/A  . Number of Children: 3  . Years of Education: N/A   Occupational History  . ON DISABILITY    Social History Main Topics  . Smoking status: Never Smoker   . Smokeless tobacco: Never Used  . Alcohol Use: No  . Drug Use: No  . Sexual Activity: Not on file   Other Topics Concern  . Not on file   Social History Narrative   Married, 3 kids, 6 G-kids  Medication List       This list is accurate as of: 02/01/15  5:45 PM.  Always use your most recent med list.               aspirin EC 81 MG tablet  Take 81 mg by mouth daily.     cyclobenzaprine 10 MG tablet  Commonly known as:  FLEXERIL  Take 10 mg by mouth 2 (two) times daily as needed for muscle spasms.     gabapentin 100 MG capsule  Commonly known as:  NEURONTIN  Take 100 mg by mouth 3 (three) times daily.     hydrochlorothiazide 25 MG tablet  Commonly known as:  HYDRODIURIL  Take 1 tablet (25 mg total) by mouth daily.     HYDROcodone-acetaminophen 5-500 MG per tablet  Commonly known as:  VICODIN  Take 1 tablet  by mouth every 6 (six) hours as needed for pain.     lisinopril 10 MG tablet  Commonly known as:  PRINIVIL,ZESTRIL  take 1 tablet by mouth once daily     magnesium oxide 400 MG tablet  Commonly known as:  MAG-OX  Take 800 mg by mouth daily.     meloxicam 15 MG tablet  Commonly known as:  MOBIC  Take 15 mg by mouth daily.     sertraline 50 MG tablet  Commonly known as:  ZOLOFT  Take 1.5 tablets (75 mg total) by mouth daily.     Vitamin D 2000 UNITS tablet  Take 2,000 Units by mouth daily.           Objective:   Physical Exam BP 124/68 mmHg  Pulse 65  Temp(Src) 97.5 F (36.4 C) (Oral)  Ht 5\' 6"  (1.676 m)  Wt 244 lb (110.678 kg)  BMI 39.40 kg/m2  SpO2 98% General:   Well developed, well nourished . NAD.  Neck:  Full range of motion. Supple. No  Thyromegaly  HEENT:  Normocephalic . Face symmetric, atraumatic Lungs:  CTA B Normal respiratory effort, no intercostal retractions, no accessory muscle use. Breast: no dominant mass, skin and nipples normal to inspection on palpation, axillary areas without mass or lymphadenopath Heart: RRR,  no murmur.  No pretibial edema bilaterally  Abdomen:  Not distended, soft, non-tender. No rebound or rigidity.  Skin: Exposed areas without rash. Not pale. Not jaundice Neurologic:  alert & oriented X3.  Speech normal, gait appropriate for age and unassisted Strength symmetric and appropriate for age.  Psych: Cognition and judgment appear intact.  Cooperative with normal attention span and concentration.  Behavior appropriate. No anxious or depressed appearing.         Assessment & Plan:

## 2015-02-01 NOTE — Assessment & Plan Note (Addendum)
Seems well-controlled, EKG-normal today. Plan:- BMP. Continue with present care

## 2015-02-01 NOTE — Assessment & Plan Note (Addendum)
Crestor worked for her but was very expensive, Pravachol increased chronic cramps. Currently taking no medications. Plan: Labs, consider a low dose of Lipitor

## 2015-02-01 NOTE — Assessment & Plan Note (Addendum)
Td 2009 prevnar 03-2014  pnm  shot ~ 03-2015 S/p zostavax   Female care Bone density test, normal  04/2008 and 04-2011 never had an abnormal PAP, PAP 04-2011 (-)--refer to gynecology Last mammogram 2016 ? per patient, no documentation. Breast exam today normal Schedule a mammogram + FH colon cancer cscope 2014 neg, was rec 5 years d/t FH  Diet-exercise discussed

## 2015-02-01 NOTE — Progress Notes (Signed)
Pre visit review using our clinic review tool, if applicable. No additional management support is needed unless otherwise documented below in the visit note. 

## 2015-02-01 NOTE — Assessment & Plan Note (Addendum)
Check labs today. Reports she is taking 2 OTCs daily

## 2015-02-01 NOTE — Assessment & Plan Note (Signed)
complaining of leg cramps, mostly nocturnal. Slightly worse when she was taking Pravachol, somewhat decrease with OTC mustard. Plan:  Checking an Mg level, she also has a history of vitamin D deficiency and we are checking levels . Encourage to take Flexeril at hs Also recommend tonic water at night.

## 2015-02-01 NOTE — Patient Instructions (Signed)
Get your blood work before you leave   For leg cramps, tried Flexeril at bedtime You may also like to try one tonic water at night   Please consider visit these websites for more information:  www.begintheconversation.org  theconversationproject.org    Fall Prevention and Home Safety Falls cause injuries and can affect all age groups. It is possible to use preventive measures to significantly decrease the likelihood of falls. There are many simple measures which can make your home safer and prevent falls. OUTDOORS  Repair cracks and edges of walkways and driveways.  Remove high doorway thresholds.  Trim shrubbery on the main path into your home.  Have good outside lighting.  Clear walkways of tools, rocks, debris, and clutter.  Check that handrails are not broken and are securely fastened. Both sides of steps should have handrails.  Have leaves, snow, and ice cleared regularly.  Use sand or salt on walkways during winter months.  In the garage, clean up grease or oil spills. BATHROOM  Install night lights.  Install grab bars by the toilet and in the tub and shower.  Use non-skid mats or decals in the tub or shower.  Place a plastic non-slip stool in the shower to sit on, if needed.  Keep floors dry and clean up all water on the floor immediately.  Remove soap buildup in the tub or shower on a regular basis.  Secure bath mats with non-slip, double-sided rug tape.  Remove throw rugs and tripping hazards from the floors. BEDROOMS  Install night lights.  Make sure a bedside light is easy to reach.  Do not use oversized bedding.  Keep a telephone by your bedside.  Have a firm chair with side arms to use for getting dressed.  Remove throw rugs and tripping hazards from the floor. KITCHEN  Keep handles on pots and pans turned toward the center of the stove. Use back burners when possible.  Clean up spills quickly and allow time for drying.  Avoid  walking on wet floors.  Avoid hot utensils and knives.  Position shelves so they are not too high or low.  Place commonly used objects within easy reach.  If necessary, use a sturdy step stool with a grab bar when reaching.  Keep electrical cables out of the way.  Do not use floor polish or wax that makes floors slippery. If you must use wax, use non-skid floor wax.  Remove throw rugs and tripping hazards from the floor. STAIRWAYS  Never leave objects on stairs.  Place handrails on both sides of stairways and use them. Fix any loose handrails. Make sure handrails on both sides of the stairways are as long as the stairs.  Check carpeting to make sure it is firmly attached along stairs. Make repairs to worn or loose carpet promptly.  Avoid placing throw rugs at the top or bottom of stairways, or properly secure the rug with carpet tape to prevent slippage. Get rid of throw rugs, if possible.  Have an electrician put in a light switch at the top and bottom of the stairs. OTHER FALL PREVENTION TIPS  Wear low-heel or rubber-soled shoes that are supportive and fit well. Wear closed toe shoes.  When using a stepladder, make sure it is fully opened and both spreaders are firmly locked. Do not climb a closed stepladder.  Add color or contrast paint or tape to grab bars and handrails in your home. Place contrasting color strips on first and last steps.  Learn and  use mobility aids as needed. Install an electrical emergency response system.  Turn on lights to avoid dark areas. Replace light bulbs that burn out immediately. Get light switches that glow.  Arrange furniture to create clear pathways. Keep furniture in the same place.  Firmly attach carpet with non-skid or double-sided tape.  Eliminate uneven floor surfaces.  Select a carpet pattern that does not visually hide the edge of steps.  Be aware of all pets. OTHER HOME SAFETY TIPS  Set the water temperature for 120 F  (48.8 C).  Keep emergency numbers on or near the telephone.  Keep smoke detectors on every level of the home and near sleeping areas. Document Released: 06/30/2002 Document Revised: 01/09/2012 Document Reviewed: 09/29/2011 [facility] Patient Information 2015 [place], [place]. This information is not intended to replace advice given to you by your health care provider. Make sure you discuss any questions you have with your health care provider.   Preventive Care for Adults Ages 17 and over  Blood pressure check.** / Every 1 to 2 years.  Lipid and cholesterol check.**/ Every 5 years beginning at age 84.  Lung cancer screening. / Every year if you are aged 65-80 years and have a 30-pack-year history of smoking and currently smoke or have quit within the past 15 years. Yearly screening is stopped once you have quit smoking for at least 15 years or develop a health problem that would prevent you from having lung cancer treatment.  Fecal occult blood test (FOBT) of stool. / Every year beginning at age 7 and continuing until age 83. You may not have to do this test if you get a colonoscopy every 10 years.  Flexible sigmoidoscopy** or colonoscopy.** / Every 5 years for a flexible sigmoidoscopy or every 10 years for a colonoscopy beginning at age 22 and continuing until age 22.  Hepatitis C blood test.** / For all people born from [birth year] through 1963 and any individual with known risks for hepatitis C.  Abdominal aortic aneurysm (AAA) screening.** / A one-time screening for ages 19 to 38 years who are current or former smokers.  Skin self-exam. / Monthly.  Influenza vaccine. / Every year.  Tetanus, diphtheria, and acellular pertussis (Tdap/Td) vaccine.** / 1 dose of Td every 10 years.  Varicella vaccine.** / Consult your health care provider.  Zoster vaccine.** / 1 dose for adults aged 33 years or older.  Pneumococcal 13-valent conjugate (PCV13) vaccine.** / Consult your health care  provider.  Pneumococcal polysaccharide (PPSV23) vaccine.** / 1 dose for all adults aged 73 years and older.  Meningococcal vaccine.** / Consult your health care provider.  Hepatitis A vaccine.** / Consult your health care provider.  Hepatitis B vaccine.** / Consult your health care provider.  Haemophilus influenzae type b (Hib) vaccine.** / Consult your health care provider. **Family history and personal history of risk and conditions may change your health care provider's recommendations. Document Released: 09/05/2001 Document Revised: 07/15/2013 Document Reviewed: 12/05/2010 [facility] Patient Information 2015 [place], [place]. This information is not intended to replace advice given to you by your health care provider. Make sure you discuss any questions you have with your health care provider.

## 2015-02-01 NOTE — Assessment & Plan Note (Signed)
Currently well controlled.  

## 2015-02-04 LAB — VITAMIN D 1,25 DIHYDROXY
Vitamin D 1, 25 (OH)2 Total: 58 pg/mL (ref 18–72)
Vitamin D3 1, 25 (OH)2: 58 pg/mL

## 2015-02-04 MED ORDER — ATORVASTATIN CALCIUM 10 MG PO TABS: 10.0000 mg | ORAL_TABLET | Freq: Every day | ORAL | Status: DC

## 2015-02-04 NOTE — Addendum Note (Signed)
Addended by: Janalee Dane C on: 02/04/2015 01:00 PM   Modules accepted: Orders

## 2015-02-12 ENCOUNTER — Telehealth: Payer: Self-pay | Admitting: Internal Medicine

## 2015-02-12 ENCOUNTER — Other Ambulatory Visit: Payer: Self-pay

## 2015-02-12 MED ORDER — LISINOPRIL 10 MG PO TABS: 10.0000 mg | ORAL_TABLET | Freq: Every day | ORAL | Status: DC

## 2015-02-12 NOTE — Telephone Encounter (Signed)
Caller name: nick from [facility] Relation to pt: Call back number: [phone] Pharmacy:  Reason for call:   Requesting a 90 day supply of lisinopril for patient

## 2015-02-12 NOTE — Telephone Encounter (Signed)
Lisinopril sent to [facility].

## 2015-02-17 DIAGNOSIS — M545 Low back pain: Secondary | ICD-10-CM | POA: Diagnosis not present

## 2015-02-17 DIAGNOSIS — F1120 Opioid dependence, uncomplicated: Secondary | ICD-10-CM | POA: Diagnosis not present

## 2015-02-17 DIAGNOSIS — F329 Major depressive disorder, single episode, unspecified: Secondary | ICD-10-CM | POA: Diagnosis not present

## 2015-02-17 DIAGNOSIS — Z79891 Long term (current) use of opiate analgesic: Secondary | ICD-10-CM | POA: Diagnosis not present

## 2015-04-25 ENCOUNTER — Telehealth: Payer: Self-pay | Admitting: Internal Medicine

## 2015-04-25 NOTE — Telephone Encounter (Signed)
Call patient : was recommended to take cholesterol medication, if she is doing that is due for labs: FLP, AST, ALT.

## 2015-04-26 NOTE — Telephone Encounter (Signed)
LMOM informing Pt to return call at her earliest convenience.

## 2015-04-28 NOTE — Telephone Encounter (Signed)
Send a letter, ask for a call back

## 2015-04-28 NOTE — Telephone Encounter (Signed)
Unable to contact Pt, please advise.  

## 2015-04-28 NOTE — Telephone Encounter (Signed)
Letter printed and mailed to Pt.  

## 2015-05-05 NOTE — Telephone Encounter (Signed)
Pt received letter and returned call. She is taking Atorvastatin as prescribed. Informed her that we do need to check cholesterol and liver functions in several days, fasting. Pt verbalized understanding and scheduled lab appt for 05/10/2015 at 1015. FLP, AST, ALT orders in chart.

## 2015-05-05 NOTE — Telephone Encounter (Signed)
thx

## 2015-05-10 ENCOUNTER — Other Ambulatory Visit (INDEPENDENT_AMBULATORY_CARE_PROVIDER_SITE_OTHER): Payer: Commercial Managed Care - HMO

## 2015-05-10 DIAGNOSIS — E785 Hyperlipidemia, unspecified: Secondary | ICD-10-CM

## 2015-05-10 LAB — LIPID PANEL
Cholesterol: 193 mg/dL (ref 0–200)
HDL: 52.7 mg/dL (ref >39.00)
LDL CALC: 126 mg/dL — AB (ref 0–99)
NonHDL: 140.35
TRIGLYCERIDES: 71 mg/dL (ref 0.0–149.0)
Total CHOL/HDL Ratio: 4
VLDL: 14.2 mg/dL (ref 0.0–40.0)

## 2015-05-10 LAB — ALT: ALT: 18 U/L (ref 0–35)

## 2015-05-10 LAB — AST: AST: 18 U/L (ref 0–37)

## 2015-05-12 ENCOUNTER — Encounter: Payer: Commercial Managed Care - HMO | Admitting: Pain Medicine

## 2015-05-20 ENCOUNTER — Encounter: Payer: Self-pay | Admitting: Pain Medicine

## 2015-05-20 ENCOUNTER — Other Ambulatory Visit: Payer: Self-pay | Admitting: Pain Medicine

## 2015-05-20 ENCOUNTER — Ambulatory Visit: Payer: Commercial Managed Care - HMO | Attending: Pain Medicine | Admitting: Pain Medicine

## 2015-05-20 VITALS — BP 118/77 | HR 72 | Temp 97.8°F | Resp 18 | Ht 65.0 in | Wt 240.0 lb

## 2015-05-20 DIAGNOSIS — M4726 Other spondylosis with radiculopathy, lumbar region: Secondary | ICD-10-CM | POA: Diagnosis not present

## 2015-05-20 DIAGNOSIS — M549 Dorsalgia, unspecified: Secondary | ICD-10-CM | POA: Diagnosis not present

## 2015-05-20 DIAGNOSIS — G894 Chronic pain syndrome: Secondary | ICD-10-CM

## 2015-05-20 DIAGNOSIS — M25569 Pain in unspecified knee: Secondary | ICD-10-CM

## 2015-05-20 DIAGNOSIS — Z79899 Other long term (current) drug therapy: Secondary | ICD-10-CM | POA: Diagnosis not present

## 2015-05-20 DIAGNOSIS — M797 Fibromyalgia: Secondary | ICD-10-CM | POA: Diagnosis not present

## 2015-05-20 DIAGNOSIS — M545 Low back pain, unspecified: Secondary | ICD-10-CM

## 2015-05-20 DIAGNOSIS — M25552 Pain in left hip: Secondary | ICD-10-CM | POA: Diagnosis not present

## 2015-05-20 DIAGNOSIS — M5136 Other intervertebral disc degeneration, lumbar region: Secondary | ICD-10-CM | POA: Insufficient documentation

## 2015-05-20 DIAGNOSIS — F1190 Opioid use, unspecified, uncomplicated: Secondary | ICD-10-CM | POA: Insufficient documentation

## 2015-05-20 DIAGNOSIS — M4806 Spinal stenosis, lumbar region: Secondary | ICD-10-CM

## 2015-05-20 DIAGNOSIS — M150 Primary generalized (osteo)arthritis: Secondary | ICD-10-CM

## 2015-05-20 DIAGNOSIS — M25551 Pain in right hip: Secondary | ICD-10-CM | POA: Insufficient documentation

## 2015-05-20 DIAGNOSIS — M25562 Pain in left knee: Secondary | ICD-10-CM | POA: Diagnosis not present

## 2015-05-20 DIAGNOSIS — M47896 Other spondylosis, lumbar region: Secondary | ICD-10-CM | POA: Diagnosis not present

## 2015-05-20 DIAGNOSIS — M4316 Spondylolisthesis, lumbar region: Secondary | ICD-10-CM | POA: Diagnosis not present

## 2015-05-20 DIAGNOSIS — M1990 Unspecified osteoarthritis, unspecified site: Secondary | ICD-10-CM | POA: Insufficient documentation

## 2015-05-20 DIAGNOSIS — M47816 Spondylosis without myelopathy or radiculopathy, lumbar region: Secondary | ICD-10-CM | POA: Insufficient documentation

## 2015-05-20 DIAGNOSIS — Q762 Congenital spondylolisthesis: Secondary | ICD-10-CM

## 2015-05-20 DIAGNOSIS — M159 Polyosteoarthritis, unspecified: Secondary | ICD-10-CM

## 2015-05-20 DIAGNOSIS — F1120 Opioid dependence, uncomplicated: Secondary | ICD-10-CM

## 2015-05-20 DIAGNOSIS — G8929 Other chronic pain: Secondary | ICD-10-CM | POA: Insufficient documentation

## 2015-05-20 DIAGNOSIS — M25561 Pain in right knee: Secondary | ICD-10-CM | POA: Insufficient documentation

## 2015-05-20 DIAGNOSIS — M25559 Pain in unspecified hip: Secondary | ICD-10-CM

## 2015-05-20 DIAGNOSIS — M5126 Other intervertebral disc displacement, lumbar region: Secondary | ICD-10-CM

## 2015-05-20 DIAGNOSIS — M5416 Radiculopathy, lumbar region: Secondary | ICD-10-CM

## 2015-05-20 DIAGNOSIS — M791 Myalgia: Secondary | ICD-10-CM

## 2015-05-20 DIAGNOSIS — Z79891 Long term (current) use of opiate analgesic: Secondary | ICD-10-CM

## 2015-05-20 DIAGNOSIS — M7918 Myalgia, other site: Secondary | ICD-10-CM | POA: Insufficient documentation

## 2015-05-20 DIAGNOSIS — M4310 Spondylolisthesis, site unspecified: Secondary | ICD-10-CM

## 2015-05-20 DIAGNOSIS — M48061 Spinal stenosis, lumbar region without neurogenic claudication: Secondary | ICD-10-CM | POA: Insufficient documentation

## 2015-05-20 MED ORDER — MAGNESIUM OXIDE 400 MG PO TABS: 400.0000 mg | ORAL_TABLET | Freq: Every day | ORAL | Status: DC

## 2015-05-20 MED ORDER — MELOXICAM 15 MG PO TABS: 15.0000 mg | ORAL_TABLET | Freq: Every day | ORAL | Status: DC

## 2015-05-20 MED ORDER — CYCLOBENZAPRINE HCL 10 MG PO TABS: 10.0000 mg | ORAL_TABLET | Freq: Two times a day (BID) | ORAL | Status: DC | PRN

## 2015-05-20 MED ORDER — HYDROCODONE-ACETAMINOPHEN 5-325 MG PO TABS: 1.0000 | ORAL_TABLET | Freq: Four times a day (QID) | ORAL | Status: DC | PRN

## 2015-05-20 MED ORDER — GABAPENTIN 100 MG PO CAPS: 100.0000 mg | ORAL_CAPSULE | Freq: Three times a day (TID) | ORAL | Status: DC

## 2015-05-20 MED ORDER — HYDROCODONE-ACETAMINOPHEN 5-500 MG PO TABS: 1.0000 | ORAL_TABLET | Freq: Four times a day (QID) | ORAL | Status: DC | PRN

## 2015-05-20 NOTE — Progress Notes (Signed)
Patient's Name: Wanda [name] MRN: [id number] DOB: 1945-12-15 DOS: 05/20/2015  Primary Reason(s) for Visit: Evaluation of one or more chronic illness with mild exacerbation or progression. CC: Back Pain   HPI:   Wanda [name] is a 69 y.o. year old, female patient, who returns today as an established patient. She has Vitamin D deficiency; Dyslipidemia; Depression; Essential hypertension; ALLERGIC RHINITIS; TMJ PAIN; CLIMACTERIC STATE, FEMALE; OSTEOARTHRITIS; Annual physical exam; Leg cramps; Chronic pain; Chronic pain syndrome; Chronic low back pain (R>L); Lumbar spondylosis; Chronic radicular lumbar pain; Lumbar facet syndrome (Bilateral, R>L); Long term current use of opiate analgesic; Long term prescription opiate use; Opiate use; Opiate dependence ([place]); Morbid obesity ([place]); Myofascial pain; Fibromyalgia; Chronic arthralgias of knees and hips; Osteoarthrosis; Grade 1 (24mm) Anterolisthesis of L3 over L4.; 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 Her primarily concern today is the Back Pain   The patient returns to the clinic stating referring that time she is having more pain in the right side of the lower back. Her primary pain is the lower back followed by the legs. In terms of the lower back she refers to the right side is much worse than the left. When she has this pain it goes down the back of the right leg to about mid thigh. In the case of the lower extremity pain the right leg pain is referred from the lower back which I believe is secondary to facet disease but she also has pain in the left lower extremity A. This left lower extremity problem is a combination of pain and numbness which goes all the way down to her foot in what seems to be an S1 dermatomal distribution. This pain seems to be radicular in nature. In addition to this, the patient has problems with  both knees with the left being much worse than the right. In the past she has had problems with low vitamin D levels and low magnesium levels and therefore we will retest this today. I will have the patient come back for a diagnostic right-sided lumbar facet block under fluoroscopic guidance. Should the patient get good relief with this, we will consider radiofrequency. In addition, I will go I didn't order an MRI of her lumbar spine to evaluate this left lower extremity S1 radicular pain that seems to be getting worse with time. The patient is morbidly obese and she has been recommended to lose some weight. Today we have renewed her medications for 3 months.  Pharmacotherapy Review: Side-effects or Adverse reactions: None reported. Effectiveness: Described as relatively effective, allowing for increase in activities of daily living (ADL). Onset of action: Within expected pharmacological parameters. Duration of action: Within normal limits for medication. Peak effect: Timing and results are as within normal expected parameters. [place] PMP: Compliant with practice rules and regulations. DST: Compliant with practice rules and regulations. Lab work: No new labs ordered by our practice. Treatment compliance: Compliant. Substance Use Disorder (SUD) Risk Level: Low Planned course of action: Continue therapy as is.  Allergies: Wanda [name] is allergic to simvastatin.  Meds: The patient has a current medication list which includes the following prescription(s): aspirin ec, atorvastatin, vitamin d, cyclobenzaprine, gabapentin, hydrochlorothiazide, lisinopril, magnesium oxide, meloxicam, sertraline, hydrocodone-acetaminophen, hydrocodone-acetaminophen, and hydrocodone-acetaminophen. Requested Prescriptions   Signed Prescriptions Disp Refills  . cyclobenzaprine (FLEXERIL) 10 MG tablet 60 tablet 2    Sig: Take 1 tablet (10 mg total) by  mouth 2 (two) times daily as needed for muscle spasms.  [name] gabapentin  (NEURONTIN) 100 MG capsule 90 capsule 2    Sig: Take 1 capsule (100 mg total) by mouth 3 (three) times daily.  . magnesium oxide (MAG-OX) 400 MG tablet 30 tablet 2    Sig: Take 1 tablet (400 mg total) by mouth daily.  . meloxicam (MOBIC) 15 MG tablet 30 tablet 2    Sig: Take 1 tablet (15 mg total) by mouth daily.  [name] HYDROcodone-acetaminophen (NORCO/VICODIN) 5-325 MG tablet 120 tablet 0    Sig: Take 1 tablet by mouth every 6 (six) hours as needed for moderate pain.  [name] HYDROcodone-acetaminophen (NORCO/VICODIN) 5-325 MG tablet 120 tablet 0    Sig: Take 1 tablet by mouth every 6 (six) hours as needed for moderate pain.  [name] HYDROcodone-acetaminophen (NORCO/VICODIN) 5-325 MG tablet 120 tablet 0    Sig: Take 1 tablet by mouth every 6 (six) hours as needed for moderate pain.    ROS: Constitutional: Afebrile, no chills, well hydrated and well nourished Gastrointestinal: negative Musculoskeletal:negative Neurological: negative Behavioral/Psych: negative  PFSH: Medical:  Wanda [name]  has a past medical history of Allergy; Hyperlipidemia; Hypertension; DJD (degenerative joint disease); Depression; Menopause; and Chronic back pain. Family: family history includes Breast cancer in her sister; Cancer in her brother and sister; Colon cancer in her brother; Diabetes in an other family member. There is no history of Coronary artery disease or CAD. Surgical:  has past surgical history that includes G3 P3 . Tobacco:  reports that she has never smoked. She has never used smokeless tobacco. Alcohol:  reports that she does not drink alcohol. Drug:  reports that she does not use illicit drugs.  Physical Exam: Vitals:  Today's Vitals   05/20/15 0924 05/20/15 0925  BP: 118/77   Pulse: 72   Temp: 97.8 F (36.6 C)   Resp: 18   Height: 5\' 5"  (1.651 m)   Weight: 240 lb (108.863 kg)   SpO2: 100%   PainSc: 7  7   PainLoc: Back   Calculated BMI: Body mass index is 39.94 kg/(m^2). General appearance:  alert, cooperative, appears stated age, moderate distress and morbidly obese Eyes: conjunctivae/corneas clear. PERRL, EOM's intact. Fundi benign. Lungs: No evidence respiratory distress, no audible rales or ronchi and no use of accessory muscles of respiration Neck: no adenopathy, no carotid bruit, no JVD, supple, symmetrical, trachea midline and thyroid not enlarged, symmetric, no tenderness/mass/nodules Back: symmetric, no curvature. ROM normal. No CVA tenderness. Extremities: The patient presents with significant grinding of the left knee. There is also swelling around that knee. No redness or increased temperature. Pulses: 2+ and symmetric Skin: Skin color, texture, turgor normal. No rashes or lesions Neurologic: Gait: Antalgic    Assessment: Encounter Diagnosis:  Primary Diagnosis: Chronic pain [G89.29]  Plan: Wanda [name] was seen today for back pain.  Diagnoses and all orders for this visit:  Chronic pain -     cyclobenzaprine (FLEXERIL) 10 MG tablet; Take 1 tablet (10 mg total) by mouth 2 (two) times daily as needed for muscle spasms. -     gabapentin (NEURONTIN) 100 MG capsule; Take 1 capsule (100 mg total) by mouth 3 (three) times daily. -     Discontinue: HYDROcodone-acetaminophen (VICODIN) 5-500 MG tablet; Take 1 tablet by mouth every 6 (six) hours as needed for pain. -     magnesium oxide (MAG-OX) 400 MG tablet; Take 1 tablet (400 mg total) by mouth daily. -  meloxicam (MOBIC) 15 MG tablet; Take 1 tablet (15 mg total) by mouth daily. -     HYDROcodone-acetaminophen (NORCO/VICODIN) 5-325 MG tablet; Take 1 tablet by mouth every 6 (six) hours as needed for moderate pain. -     HYDROcodone-acetaminophen (NORCO/VICODIN) 5-325 MG tablet; Take 1 tablet by mouth every 6 (six) hours as needed for moderate pain. -     HYDROcodone-acetaminophen (NORCO/VICODIN) 5-325 MG tablet; Take 1 tablet by mouth every 6 (six) hours as needed for moderate pain. -     Magnesium; Future -     COMPLETE  METABOLIC PANEL WITH GFR; Future -     C-reactive protein; Future -     Sedimentation rate; Future -     Vitamin D2,D3 Panel; Future  Chronic pain syndrome  Chronic low back pain (R>L)  Osteoarthritis of spine with radiculopathy, lumbar region  Chronic radicular lumbar pain -     MR Lumbar Spine Wo Contrast; Future  Lumbar facet syndrome (Bilateral, R>L) -     LUMBAR FACET(MEDIAL BRANCH NERVE BLOCK) MBNB; Future  Long term current use of opiate analgesic  Long term prescription opiate use  Opiate use  Uncomplicated opioid dependence ([place])  Morbid obesity due to excess calories ([facility])  Myofascial pain  Fibromyalgia  Chronic arthralgias of knees and hips, unspecified laterality -     DG Knee Complete 4 Views Left; Future -     DG Knee Complete 4 Views Right; Future  Primary osteoarthritis involving multiple joints  Grade 1 (4mm) Anterolisthesis of L3 over L4.  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 Generally speaking, most procedures are safe.  However, with any procedure there are risks, side effects, and the possibility of complications.  The risks and complications are dependent upon the sites that are lesioned, or the type of nerve block to be performed.  The closer the procedure is to the spine, the more serious the risks are.  Great care is taken when placing the radio frequency needles, block needles or lesioning probes, but sometimes complications can occur. 1. Infection: Any time there is an injection through the skin, there is a risk of infection.  This is why sterile conditions are used for these blocks.  There are four possible types of infection. 1. Localized skin infection. 2. Central Nervous System Infection-This can be in the  form of Meningitis, which can be deadly. 3. Epidural Infections-This can be in the form of an epidural abscess, which can cause pressure inside of the spine, causing compression of the spinal cord with subsequent paralysis. This would require an emergency surgery to decompress, and there are no guarantees that the patient would recover from the paralysis. 4. Discitis-This is an infection of the intervertebral discs.  It occurs in about 1% of discography procedures.  It is difficult to treat and it may lead to surgery.        2. Pain: the needles have to go through skin and soft tissues, will cause soreness.       3. Damage to internal structures:  The nerves to be lesioned may be near blood vessels or    other nerves which can be potentially damaged.       4. Bleeding: Bleeding is more common if the patient is taking blood thinners  such as  aspirin, Coumadin, Ticiid, Plavix, etc., or if he/she have some genetic predisposition  such as hemophilia. Bleeding into the spinal canal can cause compression of the spinal  cord with subsequent paralysis.  This would require an emergency surgery to  decompress and there are no guarantees that the patient would recover from the  paralysis.       5. Pneumothorax:  Puncturing of a lung is a possibility, every time a needle is introduced in  the area of the chest or upper back.  Pneumothorax refers to free air around the  collapsed lung(s), inside of the thoracic cavity (chest cavity).  Another two possible  complications related to a similar event would include: Hemothorax and Chylothorax.   These are variations of the Pneumothorax, where instead of air around the collapsed  lung(s), you may have blood or chyle, respectively.       6. Spinal headaches: They may occur with any procedures in the area of the spine.       7. Persistent CSF (Cerebro-Spinal Fluid) leakage: This is a rare problem, but may occur  with prolonged intrathecal or epidural catheters either due to  the formation of a fistulous  track or a dural tear.       8. Nerve damage: By working so close to the spinal cord, there is always a possibility of  nerve damage, which could be as serious as a permanent spinal cord injury with  paralysis.       9. Death:  Although rare, severe deadly allergic reactions known as "Anaphylactic  reaction" can occur to any of the medications used.      10. Worsening of the symptoms:  We can always make thing worse.  What are the chances of something like this happening? Chances of any of this occuring are extremely low.  By statistics, you have more of a chance of getting killed in a motor vehicle accident: while driving to the hospital than any of the above occurring .  Nevertheless, you should be aware that they are possibilities.  In general, it is similar to taking a shower.  Everybody knows that you can slip, hit your head and get killed.  Does that mean that you should not shower again?  Nevertheless always keep in mind that statistics do not mean anything if you happen to be on the wrong side of them.  Even if a procedure has a 1 (one) in a 1,000,000 (million) chance of going wrong, it you happen to be that one..Also, keep in mind that by statistics, you have more of a chance of having something go wrong when taking medications.  Who should not have this procedure? If you are on a blood thinning medication (e.g. Coumadin, Plavix, see list of "Blood Thinners"), or if you have an active infection going on, you should not have the procedure.  If you are taking any blood thinners, please inform your physician.  How should I prepare for this procedure?  Do not eat or drink anything at least six hours prior to the procedure.  Bring a driver with you .  It cannot be a taxi.  Come accompanied by an adult that can drive you back, and that is strong enough to help you if your legs get weak or numb from the local anesthetic.  Take all of your medicines the morning of  the procedure with just enough water to swallow them.  If you have diabetes, make sure that you are  scheduled to have your procedure done first thing in the morning, whenever possible.  If you have diabetes, take only half of your insulin dose and notify our nurse that you have done so as soon as you arrive at the clinic.  If you are diabetic, but only take blood sugar pills (oral hypoglycemic), then do not take them on the morning of your procedure.  You may take them after you have had the procedure.  Do not take aspirin or any aspirin-containing medications, at least eleven (11) days prior to the procedure.  They may prolong bleeding.  Wear loose fitting clothing that may be easy to take off and that you would not mind if it got stained with Betadine or blood.  Do not wear any jewelry or perfume  Remove any nail coloring.  It will interfere with some of our monitoring equipment.  NOTE: Remember that this is not meant to be interpreted as a complete list of all possible complications.  Unforeseen problems may occur.  BLOOD THINNERS The following drugs contain aspirin or other products, which can cause increased bleeding during surgery and should not be taken for 2 weeks prior to and 1 week after surgery.  If you should need take something for relief of minor pain, you may take acetaminophen which is found in Tylenol,m Datril, Anacin-3 and Panadol. It is not blood thinner. The products listed below are.  Do not take any of the products listed below in addition to any listed on your instruction sheet.  A.P.C or A.P.C with Codeine Codeine Phosphate Capsules #3 Ibuprofen Ridaura  ABC compound Congesprin Imuran rimadil  Advil Cope Indocin Robaxisal  Alka-Seltzer Effervescent Pain Reliever and Antacid Coricidin or Coricidin-D  Indomethacin Rufen  Alka-Seltzer plus Cold Medicine Cosprin Ketoprofen S-A-C Tablets  Anacin Analgesic Tablets or Capsules Coumadin Korlgesic Salflex  Anacin Extra  Strength Analgesic tablets or capsules CP-2 Tablets Lanoril Salicylate  Anaprox Cuprimine Capsules Levenox Salocol  Anexsia-D Dalteparin Magan Salsalate  Anodynos Darvon compound Magnesium Salicylate Sine-off  Ansaid Dasin Capsules Magsal Sodium Salicylate  Anturane Depen Capsules Marnal Soma  APF Arthritis pain formula Dewitt's Pills Measurin Stanback  Argesic Dia-Gesic Meclofenamic Sulfinpyrazone  Arthritis Bayer Timed Release Aspirin Diclofenac Meclomen Sulindac  Arthritis pain formula Anacin Dicumarol Medipren Supac  Analgesic (Safety coated) Arthralgen Diffunasal Mefanamic Suprofen  Arthritis Strength Bufferin Dihydrocodeine Mepro Compound Suprol  Arthropan liquid Dopirydamole Methcarbomol with Aspirin Synalgos  ASA tablets/Enseals Disalcid Micrainin Tagament  Ascriptin Doan's Midol Talwin  Ascriptin A/D Dolene Mobidin Tanderil  Ascriptin Extra Strength Dolobid Moblgesic Ticlid  Ascriptin with Codeine Doloprin or Doloprin with Codeine Momentum Tolectin  Asperbuf Duoprin Mono-gesic Trendar  Aspergum Duradyne Motrin or Motrin IB Triminicin  Aspirin plain, buffered or enteric coated Durasal Myochrisine Trigesic  Aspirin Suppositories Easprin Nalfon Trillsate  Aspirin with Codeine Ecotrin Regular or Extra Strength Naprosyn Uracel  Atromid-S Efficin Naproxen Ursinus  Auranofin Capsules Elmiron Neocylate Vanquish  Axotal Emagrin Norgesic Verin  Azathioprine Empirin or Empirin with Codeine Normiflo Vitamin E  Azolid Emprazil Nuprin Voltaren  Bayer Aspirin plain, buffered or children's or timed BC Tablets or powders Encaprin Orgaran Warfarin Sodium  Buff-a-Comp Enoxaparin Orudis Zorpin  Buff-a-Comp with Codeine Equegesic Os-Cal-Gesic   Buffaprin Excedrin plain, buffered or Extra Strength Oxalid   Bufferin Arthritis Strength Feldene Oxphenbutazone   Bufferin plain or Extra Strength Feldene Capsules Oxycodone with Aspirin   Bufferin with Codeine Fenoprofen Fenoprofen Pabalate or  Pabalate-SF   Buffets II Flogesic Panagesic   Buffinol plain or Extra Strength  Florinal or Florinal with Codeine Panwarfarin   Buf-Tabs Flurbiprofen Penicillamine   Butalbital Compound Four-way cold tablets Penicillin   Butazolidin Fragmin Pepto-Bismol   Carbenicillin Geminisyn Percodan   Carna Arthritis Reliever Geopen Persantine   Carprofen Gold's salt Persistin   Chloramphenicol Goody's Phenylbutazone   Chloromycetin Haltrain Piroxlcam   Clmetidine heparin Plaquenil   Cllnoril Hyco-pap Ponstel   Clofibrate Hydroxy chloroquine Propoxyphen         Before stopping any of these medications, be sure to consult the physician who ordered them.  Some, such as Coumadin (Warfarin) are ordered to prevent or treat serious conditions such as "deep thrombosis", "pumonary embolisms", and other heart problems.  The amount of time that you may need off of the medication may also vary with the medication and the reason for which you were taking it.  If you are taking any of these medications, please make sure you notify your pain physician before you undergo any procedures.         Facet Blocks Patient Information  Description: The facets are joints in the spine between the vertebrae.  Like any joints in the body, facets can become irritated and painful.  Arthritis can also effect the facets.  By injecting steroids and local anesthetic in and around these joints, we can temporarily block the nerve supply to them.  Steroids act directly on irritated nerves and tissues to reduce selling and inflammation which often leads to decreased pain.  Facet blocks may be done anywhere along the spine from the neck to the low back depending upon the location of your pain.   After numbing the skin with local anesthetic (like Novocaine), a small needle is passed onto the facet joints under x-ray guidance.  You may experience a sensation of pressure while this is being done.  The entire block usually lasts about 15-25  minutes.   Conditions which may be treated by facet blocks:   Low back/buttock pain  Neck/shoulder pain  Certain types of headaches  Preparation for the injection:  1. Do not eat any solid food or dairy products within 6 hours of your appointment. 2. You may drink clear liquid up to 2 hours before appointment.  Clear liquids include water, black coffee, juice or soda.  No milk or cream please. 3. You may take your regular medication, including pain medications, with a sip of water before your appointment.  Diabetics should hold regular insulin (if taken separately) and take 1/2 normal NPH dose the morning of the procedure.  Carry some sugar containing items with you to your appointment. 4. A driver must accompany you and be prepared to drive you home after your procedure. 5. Bring all your current medications with you. 6. An IV may be inserted and sedation may be given at the discretion of the physician. 7. A blood pressure cuff, EKG and other monitors will often be applied during the procedure.  Some patients may need to have extra oxygen administered for a short period. 8. You will be asked to provide medical information, including your allergies and medications, prior to the procedure.  We must know immediately if you are taking blood thinners (like Coumadin/Warfarin) or if you are allergic to IV iodine contrast (dye).  We must know if you could possible be pregnant.  Possible side-effects:   Bleeding from needle site  Infection (rare, may require surgery)  Nerve injury (rare)  Numbness & tingling (temporary)  Difficulty urinating (rare, temporary)  Spinal headache (a headache worse with  upright posture)  Light-headedness (temporary)  Pain at injection site (serveral days)  Decreased blood pressure (rare, temporary)  Weakness in arm/leg (temporary)  Pressure sensation in back/neck (temporary)   Call if you experience:   Fever/chills associated with headache or  increased back/neck pain  Headache worsened by an upright position  New onset, weakness or numbness of an extremity below the injection site  Hives or difficulty breathing (go to the emergency room)  Inflammation or drainage at the injection site(s)  Severe back/neck pain greater than usual  New symptoms which are concerning to you  Please note:  Although the local anesthetic injected can often make your back or neck feel good for several hours after the injection, the pain will likely return. It takes 3-7 days for steroids to work.  You may not notice any pain relief for at least one week.  If effective, we will often do a series of 2-3 injections spaced 3-6 weeks apart to maximally decrease your pain.  After the initial series, you may be a candidate for a more permanent nerve block of the facets.  If you have any questions, please call #[phone]) [facility]   Medications discontinued today:  Medications Discontinued During This Encounter  Medication Reason  . cyclobenzaprine (FLEXERIL) 10 MG tablet Reorder  . gabapentin (NEURONTIN) 100 MG capsule Reorder  . HYDROcodone-acetaminophen (VICODIN) 5-500 MG per tablet Reorder  . magnesium oxide (MAG-OX) 400 MG tablet Reorder  . meloxicam (MOBIC) 15 MG tablet Reorder  . HYDROcodone-acetaminophen (VICODIN) 5-500 MG tablet Entry Error   Medications administered today:  Wanda [name] had no medications administered during this visit.  Primary Care Physician: Kathlene November, MD Location: [facility] Note by: Kathlen Brunswick. Dossie Arbour, M.D, DABA, DABAPM, DABPM, DABIPP, FIPP

## 2015-05-20 NOTE — Patient Instructions (Signed)
GENERAL RISKS AND COMPLICATIONS  What are the risk, side effects and possible complications? Generally speaking, most procedures are safe.  However, with any procedure there are risks, side effects, and the possibility of complications.  The risks and complications are dependent upon the sites that are lesioned, or the type of nerve block to be performed.  The closer the procedure is to the spine, the more serious the risks are.  Great care is taken when placing the radio frequency needles, block needles or lesioning probes, but sometimes complications can occur. 1. Infection: Any time there is an injection through the skin, there is a risk of infection.  This is why sterile conditions are used for these blocks.  There are four possible types of infection. 1. Localized skin infection. 2. Central Nervous System Infection-This can be in the form of Meningitis, which can be deadly. 3. Epidural Infections-This can be in the form of an epidural abscess, which can cause pressure inside of the spine, causing compression of the spinal cord with subsequent paralysis. This would require an emergency surgery to decompress, and there are no guarantees that the patient would recover from the paralysis. 4. Discitis-This is an infection of the intervertebral discs.  It occurs in about 1% of discography procedures.  It is difficult to treat and it may lead to surgery.        2. Pain: the needles have to go through skin and soft tissues, will cause soreness.       3. Damage to internal structures:  The nerves to be lesioned may be near blood vessels or    other nerves which can be potentially damaged.       4. Bleeding: Bleeding is more common if the patient is taking blood thinners such as  aspirin, Coumadin, Ticiid, Plavix, etc., or if he/she have some genetic predisposition  such as hemophilia. Bleeding into the spinal canal can cause compression of the spinal  cord with subsequent paralysis.  This would require an  emergency surgery to  decompress and there are no guarantees that the patient would recover from the  paralysis.       5. Pneumothorax:  Puncturing of a lung is a possibility, every time a needle is introduced in  the area of the chest or upper back.  Pneumothorax refers to free air around the  collapsed lung(s), inside of the thoracic cavity (chest cavity).  Another two possible  complications related to a similar event would include: Hemothorax and Chylothorax.   These are variations of the Pneumothorax, where instead of air around the collapsed  lung(s), you may have blood or chyle, respectively.       6. Spinal headaches: They may occur with any procedures in the area of the spine.       7. Persistent CSF (Cerebro-Spinal Fluid) leakage: This is a rare problem, but may occur  with prolonged intrathecal or epidural catheters either due to the formation of a fistulous  track or a dural tear.       8. Nerve damage: By working so close to the spinal cord, there is always a possibility of  nerve damage, which could be as serious as a permanent spinal cord injury with  paralysis.       9. Death:  Although rare, severe deadly allergic reactions known as "Anaphylactic  reaction" can occur to any of the medications used.      10. Worsening of the symptoms:  We can always make thing worse.    What are the chances of something like this happening? Chances of any of this occuring are extremely low.  By statistics, you have more of a chance of getting killed in a motor vehicle accident: while driving to the hospital than any of the above occurring .  Nevertheless, you should be aware that they are possibilities.  In general, it is similar to taking a shower.  Everybody knows that you can slip, hit your head and get killed.  Does that mean that you should not shower again?  Nevertheless always keep in mind that statistics do not mean anything if you happen to be on the wrong side of them.  Even if a procedure has a 1  (one) in a 1,000,000 (million) chance of going wrong, it you happen to be that one..Also, keep in mind that by statistics, you have more of a chance of having something go wrong when taking medications.  Who should not have this procedure? If you are on a blood thinning medication (e.g. Coumadin, Plavix, see list of "Blood Thinners"), or if you have an active infection going on, you should not have the procedure.  If you are taking any blood thinners, please inform your physician.  How should I prepare for this procedure?  Do not eat or drink anything at least six hours prior to the procedure.  Bring a driver with you .  It cannot be a taxi.  Come accompanied by an adult that can drive you back, and that is strong enough to help you if your legs get weak or numb from the local anesthetic.  Take all of your medicines the morning of the procedure with just enough water to swallow them.  If you have diabetes, make sure that you are scheduled to have your procedure done first thing in the morning, whenever possible.  If you have diabetes, take only half of your insulin dose and notify our nurse that you have done so as soon as you arrive at the clinic.  If you are diabetic, but only take blood sugar pills (oral hypoglycemic), then do not take them on the morning of your procedure.  You may take them after you have had the procedure.  Do not take aspirin or any aspirin-containing medications, at least eleven (11) days prior to the procedure.  They may prolong bleeding.  Wear loose fitting clothing that may be easy to take off and that you would not mind if it got stained with Betadine or blood.  Do not wear any jewelry or perfume  Remove any nail coloring.  It will interfere with some of our monitoring equipment.  NOTE: Remember that this is not meant to be interpreted as a complete list of all possible complications.  Unforeseen problems may occur.  BLOOD THINNERS The following drugs  contain aspirin or other products, which can cause increased bleeding during surgery and should not be taken for 2 weeks prior to and 1 week after surgery.  If you should need take something for relief of minor pain, you may take acetaminophen which is found in Tylenol,m Datril, Anacin-3 and Panadol. It is not blood thinner. The products listed below are.  Do not take any of the products listed below in addition to any listed on your instruction sheet.  A.P.C or A.P.C with Codeine Codeine Phosphate Capsules #3 Ibuprofen Ridaura  ABC compound Congesprin Imuran rimadil  Advil Cope Indocin Robaxisal  Alka-Seltzer Effervescent Pain Reliever and Antacid Coricidin or Coricidin-D  Indomethacin Rufen    Alka-Seltzer plus Cold Medicine Cosprin Ketoprofen S-A-C Tablets  Anacin Analgesic Tablets or Capsules Coumadin Korlgesic Salflex  Anacin Extra Strength Analgesic tablets or capsules CP-2 Tablets Lanoril Salicylate  Anaprox Cuprimine Capsules Levenox Salocol  Anexsia-D Dalteparin Magan Salsalate  Anodynos Darvon compound Magnesium Salicylate Sine-off  Ansaid Dasin Capsules Magsal Sodium Salicylate  Anturane Depen Capsules Marnal Soma  APF Arthritis pain formula Dewitt's Pills Measurin Stanback  Argesic Dia-Gesic Meclofenamic Sulfinpyrazone  Arthritis Bayer Timed Release Aspirin Diclofenac Meclomen Sulindac  Arthritis pain formula Anacin Dicumarol Medipren Supac  Analgesic (Safety coated) Arthralgen Diffunasal Mefanamic Suprofen  Arthritis Strength Bufferin Dihydrocodeine Mepro Compound Suprol  Arthropan liquid Dopirydamole Methcarbomol with Aspirin Synalgos  ASA tablets/Enseals Disalcid Micrainin Tagament  Ascriptin Doan's Midol Talwin  Ascriptin A/D Dolene Mobidin Tanderil  Ascriptin Extra Strength Dolobid Moblgesic Ticlid  Ascriptin with Codeine Doloprin or Doloprin with Codeine Momentum Tolectin  Asperbuf Duoprin Mono-gesic Trendar  Aspergum Duradyne Motrin or Motrin IB Triminicin  Aspirin  plain, buffered or enteric coated Durasal Myochrisine Trigesic  Aspirin Suppositories Easprin Nalfon Trillsate  Aspirin with Codeine Ecotrin Regular or Extra Strength Naprosyn Uracel  Atromid-S Efficin Naproxen Ursinus  Auranofin Capsules Elmiron Neocylate Vanquish  Axotal Emagrin Norgesic Verin  Azathioprine Empirin or Empirin with Codeine Normiflo Vitamin E  Azolid Emprazil Nuprin Voltaren  Bayer Aspirin plain, buffered or children's or timed BC Tablets or powders Encaprin Orgaran Warfarin Sodium  Buff-a-Comp Enoxaparin Orudis Zorpin  Buff-a-Comp with Codeine Equegesic Os-Cal-Gesic   Buffaprin Excedrin plain, buffered or Extra Strength Oxalid   Bufferin Arthritis Strength Feldene Oxphenbutazone   Bufferin plain or Extra Strength Feldene Capsules Oxycodone with Aspirin   Bufferin with Codeine Fenoprofen Fenoprofen Pabalate or Pabalate-SF   Buffets II Flogesic Panagesic   Buffinol plain or Extra Strength Florinal or Florinal with Codeine Panwarfarin   Buf-Tabs Flurbiprofen Penicillamine   Butalbital Compound Four-way cold tablets Penicillin   Butazolidin Fragmin Pepto-Bismol   Carbenicillin Geminisyn Percodan   Carna Arthritis Reliever Geopen Persantine   Carprofen Gold's salt Persistin   Chloramphenicol Goody's Phenylbutazone   Chloromycetin Haltrain Piroxlcam   Clmetidine heparin Plaquenil   Cllnoril Hyco-pap Ponstel   Clofibrate Hydroxy chloroquine Propoxyphen         Before stopping any of these medications, be sure to consult the physician who ordered them.  Some, such as Coumadin (Warfarin) are ordered to prevent or treat serious conditions such as "deep thrombosis", "pumonary embolisms", and other heart problems.  The amount of time that you may need off of the medication may also vary with the medication and the reason for which you were taking it.  If you are taking any of these medications, please make sure you notify your pain physician before you undergo any  procedures.         Facet Blocks Patient Information  Description: The facets are joints in the spine between the vertebrae.  Like any joints in the body, facets can become irritated and painful.  Arthritis can also effect the facets.  By injecting steroids and local anesthetic in and around these joints, we can temporarily block the nerve supply to them.  Steroids act directly on irritated nerves and tissues to reduce selling and inflammation which often leads to decreased pain.  Facet blocks may be done anywhere along the spine from the neck to the low back depending upon the location of your pain.   After numbing the skin with local anesthetic (like Novocaine), a small needle is passed onto the facet joints under x-ray guidance.    You may experience a sensation of pressure while this is being done.  The entire block usually lasts about 15-25 minutes.   Conditions which may be treated by facet blocks:   Low back/buttock pain  Neck/shoulder pain  Certain types of headaches  Preparation for the injection:  1. Do not eat any solid food or dairy products within 6 hours of your appointment. 2. You may drink clear liquid up to 2 hours before appointment.  Clear liquids include water, black coffee, juice or soda.  No milk or cream please. 3. You may take your regular medication, including pain medications, with a sip of water before your appointment.  Diabetics should hold regular insulin (if taken separately) and take 1/2 normal NPH dose the morning of the procedure.  Carry some sugar containing items with you to your appointment. 4. A driver must accompany you and be prepared to drive you home after your procedure. 5. Bring all your current medications with you. 6. An IV may be inserted and sedation may be given at the discretion of the physician. 7. A blood pressure cuff, EKG and other monitors will often be applied during the procedure.  Some patients may need to have extra oxygen  administered for a short period. 8. You will be asked to provide medical information, including your allergies and medications, prior to the procedure.  We must know immediately if you are taking blood thinners (like Coumadin/Warfarin) or if you are allergic to IV iodine contrast (dye).  We must know if you could possible be pregnant.  Possible side-effects:   Bleeding from needle site  Infection (rare, may require surgery)  Nerve injury (rare)  Numbness & tingling (temporary)  Difficulty urinating (rare, temporary)  Spinal headache (a headache worse with upright posture)  Light-headedness (temporary)  Pain at injection site (serveral days)  Decreased blood pressure (rare, temporary)  Weakness in arm/leg (temporary)  Pressure sensation in back/neck (temporary)   Call if you experience:   Fever/chills associated with headache or increased back/neck pain  Headache worsened by an upright position  New onset, weakness or numbness of an extremity below the injection site  Hives or difficulty breathing (go to the emergency room)  Inflammation or drainage at the injection site(s)  Severe back/neck pain greater than usual  New symptoms which are concerning to you  Please note:  Although the local anesthetic injected can often make your back or neck feel good for several hours after the injection, the pain will likely return. It takes 3-7 days for steroids to work.  You may not notice any pain relief for at least one week.  If effective, we will often do a series of 2-3 injections spaced 3-6 weeks apart to maximally decrease your pain.  After the initial series, you may be a candidate for a more permanent nerve block of the facets.  If you have any questions, please call [phone] [facility] 

## 2015-05-20 NOTE — Progress Notes (Signed)
Safety precautions to be maintained throughout the outpatient stay will include: orient to surroundings, keep bed in low position, maintain call bell within reach at all times, provide assistance with transfer out of bed and ambulation.  

## 2015-05-29 LAB — TOXASSURE SELECT 13 (MW), URINE: PDF: 0

## 2015-06-14 ENCOUNTER — Other Ambulatory Visit: Payer: Self-pay | Admitting: Pain Medicine

## 2015-06-24 ENCOUNTER — Other Ambulatory Visit: Payer: Self-pay

## 2015-06-29 ENCOUNTER — Ambulatory Visit: Payer: Commercial Managed Care - HMO | Admitting: Pain Medicine

## 2015-06-29 ENCOUNTER — Other Ambulatory Visit: Payer: Self-pay | Admitting: Pain Medicine

## 2015-07-06 ENCOUNTER — Encounter: Payer: Self-pay | Admitting: Pain Medicine

## 2015-07-06 ENCOUNTER — Ambulatory Visit: Payer: Commercial Managed Care - HMO | Attending: Pain Medicine | Admitting: Pain Medicine

## 2015-07-06 VITALS — BP 110/73 | HR 64 | Temp 97.9°F | Resp 12 | Ht 64.0 in | Wt 240.0 lb

## 2015-07-06 DIAGNOSIS — M47817 Spondylosis without myelopathy or radiculopathy, lumbosacral region: Secondary | ICD-10-CM | POA: Insufficient documentation

## 2015-07-06 DIAGNOSIS — M5126 Other intervertebral disc displacement, lumbar region: Secondary | ICD-10-CM | POA: Diagnosis not present

## 2015-07-06 DIAGNOSIS — E785 Hyperlipidemia, unspecified: Secondary | ICD-10-CM | POA: Diagnosis not present

## 2015-07-06 DIAGNOSIS — M4316 Spondylolisthesis, lumbar region: Secondary | ICD-10-CM | POA: Diagnosis not present

## 2015-07-06 DIAGNOSIS — I10 Essential (primary) hypertension: Secondary | ICD-10-CM | POA: Insufficient documentation

## 2015-07-06 DIAGNOSIS — M4806 Spinal stenosis, lumbar region: Secondary | ICD-10-CM | POA: Insufficient documentation

## 2015-07-06 DIAGNOSIS — E559 Vitamin D deficiency, unspecified: Secondary | ICD-10-CM | POA: Diagnosis not present

## 2015-07-06 DIAGNOSIS — M797 Fibromyalgia: Secondary | ICD-10-CM | POA: Insufficient documentation

## 2015-07-06 DIAGNOSIS — F1190 Opioid use, unspecified, uncomplicated: Secondary | ICD-10-CM | POA: Insufficient documentation

## 2015-07-06 DIAGNOSIS — M4310 Spondylolisthesis, site unspecified: Secondary | ICD-10-CM

## 2015-07-06 DIAGNOSIS — M160 Bilateral primary osteoarthritis of hip: Secondary | ICD-10-CM | POA: Insufficient documentation

## 2015-07-06 DIAGNOSIS — F329 Major depressive disorder, single episode, unspecified: Secondary | ICD-10-CM | POA: Diagnosis not present

## 2015-07-06 DIAGNOSIS — M545 Low back pain: Secondary | ICD-10-CM

## 2015-07-06 DIAGNOSIS — M47816 Spondylosis without myelopathy or radiculopathy, lumbar region: Secondary | ICD-10-CM

## 2015-07-06 DIAGNOSIS — M549 Dorsalgia, unspecified: Secondary | ICD-10-CM | POA: Diagnosis present

## 2015-07-06 DIAGNOSIS — M47896 Other spondylosis, lumbar region: Secondary | ICD-10-CM

## 2015-07-06 MED ORDER — FENTANYL CITRATE (PF) 100 MCG/2ML IJ SOLN
INTRAMUSCULAR | Status: AC
  Administered 2015-07-06: 100 ug via INTRAVENOUS
  Filled 2015-07-06: qty 2

## 2015-07-06 MED ORDER — TRIAMCINOLONE ACETONIDE 40 MG/ML IJ SUSP
40.0000 mg | Freq: Once | INTRAMUSCULAR | Status: AC
  Administered 2015-07-06: 40 mg

## 2015-07-06 MED ORDER — FENTANYL CITRATE (PF) 100 MCG/2ML IJ SOLN
100.0000 ug | Freq: Once | INTRAMUSCULAR | Status: AC
  Administered 2015-07-06: 100 ug via INTRAVENOUS

## 2015-07-06 MED ORDER — ROPIVACAINE HCL 2 MG/ML IJ SOLN
9.0000 mL | Freq: Once | INTRAMUSCULAR | Status: AC
  Administered 2015-07-06: 9 mL

## 2015-07-06 MED ORDER — LIDOCAINE HCL (PF) 1 % IJ SOLN: 10.0000 mL | Freq: Once | INTRAMUSCULAR | Status: DC

## 2015-07-06 MED ORDER — ROPIVACAINE HCL 2 MG/ML IJ SOLN
INTRAMUSCULAR | Status: AC
  Administered 2015-07-06: 9 mL
  Filled 2015-07-06: qty 10

## 2015-07-06 MED ORDER — TRIAMCINOLONE ACETONIDE 40 MG/ML IJ SUSP
INTRAMUSCULAR | Status: AC
  Administered 2015-07-06: 40 mg
  Filled 2015-07-06: qty 1

## 2015-07-06 MED ORDER — MIDAZOLAM HCL 5 MG/5ML IJ SOLN
INTRAMUSCULAR | Status: AC
  Administered 2015-07-06: 4 mg via INTRAVENOUS
  Filled 2015-07-06: qty 5

## 2015-07-06 MED ORDER — LACTATED RINGERS IV SOLN: 1000.0000 mL | INTRAVENOUS | Status: AC

## 2015-07-06 MED ORDER — MIDAZOLAM HCL 5 MG/5ML IJ SOLN
5.0000 mg | Freq: Once | INTRAMUSCULAR | Status: AC
  Administered 2015-07-06: 4 mg via INTRAVENOUS

## 2015-07-06 NOTE — Patient Instructions (Signed)

## 2015-07-06 NOTE — Progress Notes (Signed)
Patient's Name: Wanda [name] MRN: [id number] DOB: 08/13/1945 DOS: 07/06/2015  Primary Reason(s) for Visit: Interventional Pain Management Treatment. CC: Back Pain   Pre-Procedure Assessment: Wanda [name] is a 69 y.o. year old, female patient, seen today for interventional treatment. She has Vitamin D deficiency; Dyslipidemia; Depression; Essential hypertension; ALLERGIC RHINITIS; TMJ PAIN; CLIMACTERIC STATE, FEMALE; OSTEOARTHRITIS; Annual physical exam; Leg cramps; Chronic pain; Chronic pain syndrome; Chronic low back pain (R>L); Lumbar spondylosis; Chronic radicular lumbar pain; Lumbar facet syndrome (Bilateral, R>L); Long term current use of opiate analgesic; Long term prescription opiate use; Opiate use; Opiate dependence (HCC); Morbid obesity (HCC); Myofascial pain; Fibromyalgia; Chronic arthralgias of knees and hips; Osteoarthrosis; Grade 1 (2mm) Anterolisthesis of L3 over L4.; 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 Her primarily concern today is the Back Pain Verification of the correct person, correct site (including marking of site), and correct procedure were performed and confirmed by the patient. Today's Vitals   07/06/15 1043 07/06/15 1046 07/06/15 1059 07/06/15 1110  BP: 122/70 115/71 118/74 110/73  Pulse: 66 61 58 64  Temp:      TempSrc:      Resp: 12 11  12  Height:      Weight:      SpO2: 96% 90% 100% 99%  PainSc:      Calculated BMI: Body mass index is 41.18 kg/(m^2). Allergies: She is allergic to simvastatin.. Primary Diagnosis: Facet syndrome, lumbar [M54.5]  Procedure: Type: Diagnostic Medial Branch Facet Block Region: Lumbar Level: L2, L3, L4, L5, & S1 Medial Branch Level(s) Laterality: Right  Indications: Spondylosis, Lumbosacral Region Lumbosacral Spine Pain  Consent: Secured. Under the influence of no sedatives a  written informed consent was obtained, after having provided information on the risks and possible complications. To fulfill our ethical and legal obligations, as recommended by the American Medical Association's Code of Ethics, we have provided information to the patient about our clinical impression; the nature and purpose of the treatment or procedure; the risks, benefits, and possible complications of the intervention; alternatives; the risk(s) and benefit(s) of the alternative treatment(s) or procedure(s); and the risk(s) and benefit(s) of doing nothing. The patient was provided information about the risks and possible complications associated with the procedure. In the case of spinal procedures these may include, but are not limited to, failure to achieve desired goals, infection, bleeding, organ or nerve damage, allergic reactions, paralysis, and death. In addition, the patient was informed that Medicine is not an exact science; therefore, there is also the possibility of unforeseen risks and possible complications that may result in a catastrophic outcome. The patient indicated having understood very clearly. We have given the patient no guarantees and we have made no promises. Enough time was given to the patient to ask questions, all of which were answered to the patient's satisfaction.  Pre-Procedure Preparation: Safety Precautions: Allergies reviewed. Appropriate site, procedure, and patient were confirmed by following the Joint Commission's Universal Protocol (UP.01.01.01), in the form of a "Time Out". The patient was asked to confirm marked site and procedure, before commencing. The patient was asked about blood thinners, or active infections, both of which were denied. Patient was assessed for positional comfort and all pressure points were checked before starting procedure. Monitoring:  As per clinic protocol. Infection Control Precautions: Sterile technique used. Standard Universal Precautions  were taken as recommended by the Department of Human Services Center for   Disease Control and Prevention (CDC). Standard pre-surgical skin prep was conducted. Respiratory hygiene and cough etiquette was practiced. Hand hygiene observed. Safe injection practices and needle disposal techniques followed. SDV (single dose vial) medications used. Medications properly checked for expiration dates and contaminants. Personal protective equipment (PPE) used: Sterile double glove technique. Radiation resistant gloves. Sterile surgical gloves.  Anesthesia, Analgesia, Anxiolysis: Type: Moderate (Conscious) Sedation & Local Anesthesia. Meaningful verbal contact was maintained, with the patient at all times during the procedure. Local Anesthetic(s): Lidocaine 1% IV Access: Secured Sedation: Meaningful verbal contact was maintained at all times during the procedure. Indication(s):Anxiolysis and Analgesia.  Description of Procedure Process:  Time-out: "Time-out" completed before starting procedure, as per protocol. Position: Prone Target Area: For Lumbar Facet blocks, the target is the groove formed by the junction of the transverse process and superior articular process. For the L5 dorsal ramus, the target is the notch between superior articular process and sacral ala. For the S1 dorsal ramus, the target is the superior and lateral edge of the posterior S1 Sacral foramen. Approach: Paramedial approach. Area Prepped: Entire Posterior Lumbosacral Region Prepping solution: ChloraPrep (2% chlorhexidine gluconate and 70% isopropyl alcohol) Safety Precautions: Aspiration looking for blood return was conducted prior to all injections. At no point did we inject any substances, as a needle was being advanced. No attempts were made at seeking any paresthesias. Safe injection practices and needle disposal techniques used. Medications properly checked for expiration dates. SDV (single dose vial) medications used. Description  of the Procedure: Protocol guidelines were followed. The patient was placed in position over the fluoroscopy table. The target area was identified and the area prepped in the usual manner. Skin desensitized using vapocoolant spray. Skin & deeper tissues infiltrated with local anesthetic. Appropriate amount of time allowed to pass for local anesthetics to take effect. The procedure needle was introduced through the skin, ipsilateral to the reported pain, and advanced to the target area. Employing the "Medial Branch Technique", the needles were advanced to the angle made by the superior and medial portion of the transverse process, and the lateral and inferior portion of the superior articulating process of the targeted vertebral bodies. This area is known as "Burton's Eye" or the "Eye of the Scottish Dog". A procedure needle was introduced through the skin, and this time advanced to the angle made by the superior and medial border of the sacral ala, and the lateral border of the S1 vertebral body. This last needle was later repositioned at the superior and lateral border of the posterior S1 foramen. Negative aspiration confirmed. Solution injected in intermittent fashion, asking for systemic symptoms every 0.5cc of injectate. The needles were then removed and the area cleansed, making sure to leave some of the prepping solution back to take advantage of its long term bactericidal properties. EBL: None Materials & Medications Used:  Needle(s) Used: 22g - 5" Spinal Needle(s) Medications Administered today: We administered ropivacaine (PF) 2 mg/ml (0.2%), fentaNYL, triamcinolone acetonide, midazolam, fentaNYL, midazolam, ropivacaine (PF) 2 mg/ml (0.2%), and triamcinolone acetonide.Please see chart orders for dosing details.  Imaging Guidance:  Type of Imaging Technique: Fluoroscopy Guidance (Spinal) Indication(s): Assistance in needle guidance and placement for procedures requiring needle placement in or near  specific anatomical locations not easily accessible without such assistance. Exposure Time: Please see nurses notes. Contrast: None required. Fluoroscopic Guidance: I was personally present in the fluoroscopy suite, where the patient was placed in position for the procedure, over the fluoroscopy-compatible table. Fluoroscopy was manipulated, using "Tunnel Vision   Technique", to obtain the best possible view of the target area, on the affected side. Parallax error was corrected before commencing the procedure. A "direction-depth-direction" technique was used to introduce the needle under continuous pulsed fluoroscopic guidance. Once the target was reached, antero-posterior, oblique, and lateral fluoroscopic projection views were taken to confirm needle placement in all planes. Permanently recorded images stored by scanning into EMR. Interpretation: Intraoperative imaging interpretation by performing Physician. Adequate needle placement confirmed. Adequate needle placement confirmed in AP, lateral, & Oblique Views. No contrast injected.  Antibiotics:  Type:  Antibiotics Given (last 72 hours)    None      Indication(s): No indications identified.  Post-operative Assessment:  Complications: No immediate post-treatment complications were observed. Disposition: Return to clinic for follow-up evaluation. The patient tolerated the entire procedure well. A repeat set of vitals were taken after the procedure and the patient was kept under observation following institutional policy, for this procedure. Post-procedural neurological assessment was performed, showing return to baseline, prior to discharge. The patient was discharged home, once institutional criteria were met. The patient was provided with post-procedure discharge instructions, including a section on how to identify potential problems. Should any problems arise concerning this procedure, the patient was given instructions to immediately contact us,  at any time, without hesitation. In any case, we plan to contact the patient by telephone for a follow-up status report regarding this interventional procedure. Comments:  No additional relevant information.  Primary Care Physician: Kathlene November, MD Location: [facility] Note by: Kathlen Brunswick. Dossie Arbour, M.D, DABA, DABAPM, DABPM, DABIPP, FIPP   Illustration of the posterior view of the lumbar spine and the posterior neural structures. Laminae of L2 through S1 are labeled. DPRL5, dorsal primary ramus of L5; DPRS1, dorsal primary ramus of S1; DPR3, dorsal primary ramus of L3; FJ, facet (zygapophyseal) joint L3-L4; I, inferior articular process of L4; LB1, lateral branch of dorsal primary ramus of L1; IAB, inferior articular branches from L3 medial branch (supplies L4-L5 facet joint); IBP, intermediate branch plexus; MB3, medial branch of dorsal primary ramus of L3; NR3, third lumbar nerve root; S, superior articular process of L5; SAB, superior articular branches from L4 (supplies L4-5 facet joint also); TP3, transverse process of L3.  Disclaimer:  Medicine is not an Chief Strategy Officer. The only guarantee in medicine is that nothing is guaranteed. It is important to note that the decision to proceed with this intervention was based on the information collected from the patient. The Data and conclusions were drawn from the patient's questionnaire, the interview, and the physical examination. Because the information was provided in large part by the patient, it cannot be guaranteed that it has not been purposely or unconsciously manipulated. Every effort has been made to obtain as much relevant data as possible for this evaluation. It is important to note that the conclusions that lead to this procedure are derived in large part from the available data. Always take into account that the treatment will also be dependent on availability of resources and existing treatment guidelines, considered  by other Pain Management Practitioners as being common knowledge and practice, at the time of the intervention. For Medico-Legal purposes, it is also important to point out that variation in procedural techniques and pharmacological choices are the acceptable norm. The indications, contraindications, technique, and results of the above procedure should only be interpreted and judged by a Board-Certified Interventional Pain Specialist with extensive familiarity and expertise in the same exact procedure and technique. Attempts at  providing opinions without similar or greater experience and expertise than that of the treating physician will be considered as inappropriate and unethical, and shall result in a formal complaint to the state medical board and applicable specialty societies.

## 2015-07-06 NOTE — Progress Notes (Signed)
Safety precautions to be maintained throughout the outpatient stay will include: orient to surroundings, keep bed in low position, maintain call bell within reach at all times, provide assistance with transfer out of bed and ambulation.  

## 2015-07-07 ENCOUNTER — Telehealth: Payer: Self-pay | Admitting: *Deleted

## 2015-07-07 NOTE — Telephone Encounter (Signed)
Denies complications post procedure. 

## 2015-07-08 ENCOUNTER — Ambulatory Visit (HOSPITAL_COMMUNITY): Payer: Commercial Managed Care - HMO

## 2015-07-21 ENCOUNTER — Ambulatory Visit: Payer: Commercial Managed Care - HMO | Admitting: Pain Medicine

## 2015-08-10 ENCOUNTER — Encounter: Payer: Self-pay | Admitting: Internal Medicine

## 2015-08-10 ENCOUNTER — Ambulatory Visit (INDEPENDENT_AMBULATORY_CARE_PROVIDER_SITE_OTHER): Payer: Commercial Managed Care - HMO | Admitting: Internal Medicine

## 2015-08-10 VITALS — BP 118/72 | HR 60 | Temp 98.0°F | Ht 64.0 in | Wt 243.5 lb

## 2015-08-10 DIAGNOSIS — F32A Depression, unspecified: Secondary | ICD-10-CM

## 2015-08-10 DIAGNOSIS — F329 Major depressive disorder, single episode, unspecified: Secondary | ICD-10-CM

## 2015-08-10 DIAGNOSIS — I10 Essential (primary) hypertension: Secondary | ICD-10-CM | POA: Diagnosis not present

## 2015-08-10 LAB — BASIC METABOLIC PANEL
BUN: 14 mg/dL (ref 6–23)
CALCIUM: 9.5 mg/dL (ref 8.4–10.5)
CO2: 29 meq/L (ref 19–32)
CREATININE: 0.67 mg/dL (ref 0.40–1.20)
Chloride: 105 mEq/L (ref 96–112)
GFR: 112.86 mL/min (ref >60.00)
GLUCOSE: 98 mg/dL (ref 70–99)
Potassium: 4.4 mEq/L (ref 3.5–5.1)
Sodium: 139 mEq/L (ref 135–145)

## 2015-08-10 MED ORDER — LISINOPRIL 10 MG PO TABS: 10.0000 mg | ORAL_TABLET | Freq: Every day | ORAL | Status: DC

## 2015-08-10 MED ORDER — SERTRALINE HCL 50 MG PO TABS: 75.0000 mg | ORAL_TABLET | Freq: Every day | ORAL | Status: DC

## 2015-08-10 MED ORDER — HYDROCHLOROTHIAZIDE 25 MG PO TABS: 25.0000 mg | ORAL_TABLET | Freq: Every day | ORAL | Status: DC

## 2015-08-10 MED ORDER — ATORVASTATIN CALCIUM 10 MG PO TABS: 10.0000 mg | ORAL_TABLET | Freq: Every day | ORAL | Status: DC

## 2015-08-10 NOTE — Progress Notes (Signed)
Subjective:    Patient ID: Wanda [name], female    DOB: 11/22/1945, 70 y.o.   MRN: [id number]  DOS:  08/10/2015 Type of visit - description : Routine visit Interval history: Anxiety-depression: See nurse's notes, her husband's health is deteriorating, she is a caregiver, a lot of stress on her, sometimes can't sleep. Medications reviewed: Good compliance HTN: Well-controlled, ambulatory BPs okay.   Review of Systems Denies chest pain, difficulty breathing. Continue with numbness at the left leg, distal- lateraly, for the last 2 years, likely related to back pain. She is concerned about a clot. Denies leg swelling per se or calf pain  Past Medical History  Diagnosis Date  . Allergy     RHINITIS  . Hyperlipidemia   . Hypertension   . DJD (degenerative joint disease)     back pain , on disability  . Depression   . Menopause     onset age 59   . Chronic back pain     Past Surgical History  Procedure Laterality Date  . G3 p3       Social History   Social History  . Marital Status: Married    Spouse Name: N/A  . Number of Children: 3  . Years of Education: N/A   Occupational History  . ON DISABILITY    Social History Main Topics  . Smoking status: Never Smoker   . Smokeless tobacco: Never Used  . Alcohol Use: No  . Drug Use: No  . Sexual Activity: Not on file   Other Topics Concern  . Not on file   Social History Narrative   Married, 3 kids, 6 G-kids         Medication List       This list is accurate as of: 08/10/15 11:59 PM.  Always use your most recent med list.               aspirin EC 81 MG tablet  Take 81 mg by mouth daily.     atorvastatin 10 MG tablet  Commonly known as:  LIPITOR  Take 1 tablet (10 mg total) by mouth at bedtime.     cyclobenzaprine 10 MG tablet  Commonly known as:  FLEXERIL  Take 1 tablet (10 mg total) by mouth 2 (two) times daily as needed for muscle spasms.     gabapentin 100 MG capsule  Commonly known as:   NEURONTIN  Take 1 capsule (100 mg total) by mouth 3 (three) times daily.     hydrochlorothiazide 25 MG tablet  Commonly known as:  HYDRODIURIL  Take 1 tablet (25 mg total) by mouth daily.     HYDROcodone-acetaminophen 5-325 MG tablet  Commonly known as:  NORCO/VICODIN  Take 1 tablet by mouth every 6 (six) hours as needed for moderate pain.     lisinopril 10 MG tablet  Commonly known as:  PRINIVIL,ZESTRIL  Take 1 tablet (10 mg total) by mouth daily.     magnesium oxide 400 MG tablet  Commonly known as:  MAG-OX  Take 1 tablet (400 mg total) by mouth daily.     meloxicam 15 MG tablet  Commonly known as:  MOBIC  Take 1 tablet (15 mg total) by mouth daily.     sertraline 50 MG tablet  Commonly known as:  ZOLOFT  Take 1.5 tablets (75 mg total) by mouth daily.     Vitamin D 2000 units tablet  Take 2,000 Units by mouth daily.  Objective:   Physical Exam BP 118/72 mmHg  Pulse 60  Temp(Src) 98 F (36.7 C) (Oral)  Ht 5\' 4"  (1.626 m)  Wt 243 lb 8 oz (110.451 kg)  BMI 41.78 kg/m2  SpO2 98% General:   Well developed, well nourished . NAD.  HEENT:  Normocephalic . Face symmetric, atraumatic Lungs:  CTA B Normal respiratory effort, no intercostal retractions, no accessory muscle use. Heart: RRR,  no murmur.  No pretibial edema bilaterally  Extremities: Calves no TTP, left calf is slightly larger but no edema, not a new finding per patient. Skin: Not pale. Not jaundice Neurologic:  alert & oriented X3.  Speech normal, gait appropriate for age and unassisted Psych--  Cognition and judgment appear intact.  Cooperative with normal attention span and concentration.  Behavior appropriate. Anxious, tearful when we talked about her husband    Assessment & Plan:   Assessment HTN Hyperlipidemia Depression Morbid obesity MSK: DJD, chronic back pain, hydrocodone rx by DR Dossie Arbour  Plan; HTN: Well-controlled, refill meds, check a BMP Depression- anxiety: sx  increased dt husband's health, he has prostate ca; counseled to the best of my ability, encouraged to get some help from family so she is able to take a break and avoid burnout. Discussed increase sertraline, not ready for that but will if she changes her mind RTC 6 months

## 2015-08-10 NOTE — Progress Notes (Signed)
Pre visit review using our clinic review tool, if applicable. No additional management support is needed unless otherwise documented below in the visit note. 

## 2015-08-10 NOTE — Patient Instructions (Signed)
BEFORE YOU LEAVE THE OFFICE:  GO TO THE LAB  Get the blood work    GO TO THE FRONT DESK Schedule a complete physical exam to be done in 6 months Please be fasting    AFTER YOU LEAVE THE OFFICE:  Call if you feel you need to adjust your dose of sertraline

## 2015-08-18 ENCOUNTER — Other Ambulatory Visit: Payer: Self-pay | Admitting: Pain Medicine

## 2015-08-18 ENCOUNTER — Encounter: Payer: Self-pay | Admitting: Pain Medicine

## 2015-08-18 ENCOUNTER — Ambulatory Visit: Payer: Commercial Managed Care - HMO | Attending: Pain Medicine | Admitting: Pain Medicine

## 2015-08-18 VITALS — BP 121/90 | HR 64 | Temp 98.2°F | Resp 16 | Ht 62.0 in | Wt 242.0 lb

## 2015-08-18 DIAGNOSIS — I10 Essential (primary) hypertension: Secondary | ICD-10-CM | POA: Diagnosis not present

## 2015-08-18 DIAGNOSIS — F1190 Opioid use, unspecified, uncomplicated: Secondary | ICD-10-CM | POA: Diagnosis not present

## 2015-08-18 DIAGNOSIS — Z79899 Other long term (current) drug therapy: Secondary | ICD-10-CM

## 2015-08-18 DIAGNOSIS — E559 Vitamin D deficiency, unspecified: Secondary | ICD-10-CM | POA: Insufficient documentation

## 2015-08-18 DIAGNOSIS — Z79891 Long term (current) use of opiate analgesic: Secondary | ICD-10-CM

## 2015-08-18 DIAGNOSIS — M5126 Other intervertebral disc displacement, lumbar region: Secondary | ICD-10-CM | POA: Insufficient documentation

## 2015-08-18 DIAGNOSIS — Z5181 Encounter for therapeutic drug level monitoring: Secondary | ICD-10-CM | POA: Diagnosis not present

## 2015-08-18 DIAGNOSIS — M533 Sacrococcygeal disorders, not elsewhere classified: Secondary | ICD-10-CM | POA: Insufficient documentation

## 2015-08-18 DIAGNOSIS — G8929 Other chronic pain: Secondary | ICD-10-CM | POA: Diagnosis not present

## 2015-08-18 DIAGNOSIS — F329 Major depressive disorder, single episode, unspecified: Secondary | ICD-10-CM | POA: Insufficient documentation

## 2015-08-18 DIAGNOSIS — M545 Low back pain: Secondary | ICD-10-CM

## 2015-08-18 DIAGNOSIS — M4806 Spinal stenosis, lumbar region: Secondary | ICD-10-CM | POA: Insufficient documentation

## 2015-08-18 DIAGNOSIS — M79605 Pain in left leg: Secondary | ICD-10-CM | POA: Insufficient documentation

## 2015-08-18 DIAGNOSIS — M549 Dorsalgia, unspecified: Secondary | ICD-10-CM | POA: Insufficient documentation

## 2015-08-18 DIAGNOSIS — M47816 Spondylosis without myelopathy or radiculopathy, lumbar region: Secondary | ICD-10-CM

## 2015-08-18 DIAGNOSIS — E785 Hyperlipidemia, unspecified: Secondary | ICD-10-CM | POA: Diagnosis not present

## 2015-08-18 DIAGNOSIS — Z7189 Other specified counseling: Secondary | ICD-10-CM

## 2015-08-18 DIAGNOSIS — M797 Fibromyalgia: Secondary | ICD-10-CM | POA: Insufficient documentation

## 2015-08-18 DIAGNOSIS — M5410 Radiculopathy, site unspecified: Secondary | ICD-10-CM

## 2015-08-18 DIAGNOSIS — Z0000 Encounter for general adult medical examination without abnormal findings: Secondary | ICD-10-CM | POA: Insufficient documentation

## 2015-08-18 DIAGNOSIS — M4316 Spondylolisthesis, lumbar region: Secondary | ICD-10-CM | POA: Insufficient documentation

## 2015-08-18 LAB — SEDIMENTATION RATE: Sed Rate: 28 mm/hr (ref 0–30)

## 2015-08-18 LAB — COMPREHENSIVE METABOLIC PANEL
ALK PHOS: 80 U/L (ref 38–126)
ALT: 25 U/L (ref 14–54)
AST: 22 U/L (ref 15–41)
Albumin: 4 g/dL (ref 3.5–5.0)
Anion gap: 6 (ref 5–15)
BUN: 17 mg/dL (ref 6–20)
CALCIUM: 9.7 mg/dL (ref 8.9–10.3)
CO2: 29 mmol/L (ref 22–32)
CREATININE: 0.58 mg/dL (ref 0.44–1.00)
Chloride: 104 mmol/L (ref 101–111)
Glucose, Bld: 91 mg/dL (ref 65–99)
Potassium: 4.1 mmol/L (ref 3.5–5.1)
Sodium: 139 mmol/L (ref 135–145)
Total Bilirubin: 0.6 mg/dL (ref 0.3–1.2)
Total Protein: 7.5 g/dL (ref 6.5–8.1)

## 2015-08-18 LAB — MAGNESIUM: MAGNESIUM: 2 mg/dL (ref 1.7–2.4)

## 2015-08-18 LAB — C-REACTIVE PROTEIN

## 2015-08-18 MED ORDER — GABAPENTIN 100 MG PO CAPS: 100.0000 mg | ORAL_CAPSULE | Freq: Three times a day (TID) | ORAL | Status: DC

## 2015-08-18 MED ORDER — CYCLOBENZAPRINE HCL 10 MG PO TABS: 10.0000 mg | ORAL_TABLET | Freq: Two times a day (BID) | ORAL | Status: DC | PRN

## 2015-08-18 MED ORDER — HYDROCODONE-ACETAMINOPHEN 5-325 MG PO TABS: 1.0000 | ORAL_TABLET | Freq: Four times a day (QID) | ORAL | Status: DC | PRN

## 2015-08-18 MED ORDER — MELOXICAM 15 MG PO TABS: 15.0000 mg | ORAL_TABLET | Freq: Every day | ORAL | Status: DC

## 2015-08-18 MED ORDER — MAGNESIUM OXIDE 400 MG PO TABS: 400.0000 mg | ORAL_TABLET | Freq: Every day | ORAL | Status: DC

## 2015-08-18 NOTE — Patient Instructions (Signed)
Sacroiliac (SI) Joint Injection Patient Information  Description: The sacroiliac joint connects the scrum (very low back and tailbone) to the ilium (a pelvic bone which also forms half of the hip joint).  Normally this joint experiences very little motion.  When this joint becomes inflamed or unstable low back and or hip and pelvis pain may result.  Injection of this joint with local anesthetics (numbing medicines) and steroids can provide diagnostic information and reduce pain.  This injection is performed with the aid of x-ray guidance into the tailbone area while you are lying on your stomach.   You may experience an electrical sensation down the leg while this is being done.  You may also experience numbness.  We also may ask if we are reproducing your normal pain during the injection.  Conditions which may be treated SI injection:   Low back, buttock, hip or leg pain  Preparation for the Injection:  1. Do not eat any solid food or dairy products within 6 hours of your appointment.  2. You may drink clear liquids up to 2 hours before appointment.  Clear liquids include water, black coffee, juice or soda.  No milk or cream please. 3. You may take your regular medications, including pain medications with a sip of water before your appointment.  Diabetics should hold regular insulin (if take separately) and take 1/2 normal NPH dose the morning of the procedure.  Carry some sugar containing items with you to your appointment. 4. A driver must accompany you and be prepared to drive you home after your procedure. 5. Bring all of your current medications with you. 6. An IV may be inserted and sedation may be given at the discretion of the physician. 7. A blood pressure cuff, EKG and other monitors will often be applied during the procedure.  Some patients may need to have extra oxygen administered for a short period.  8. You will be asked to provide medical information, including your allergies,  prior to the procedure.  We must know immediately if you are taking blood thinners (like Coumadin/Warfarin) or if you are allergic to IV iodine contrast (dye).  We must know if you could possible be pregnant.  Possible side effects:   Bleeding from needle site  Infection (rare, may require surgery)  Nerve injury (rare)  Numbness & tingling (temporary)  A brief convulsion or seizure  Light-headedness (temporary)  Pain at injection site (several days)  Decreased blood pressure (temporary)  Weakness in the leg (temporary)   Call if you experience:   New onset weakness or numbness of an extremity below the injection site that last more than 8 hours.  Hives or difficulty breathing ( go to the emergency room)  Inflammation or drainage at the injection site  Any new symptoms which are concerning to you  Please note:  Although the local anesthetic injected can often make your back/ hip/ buttock/ leg feel good for several hours after the injections, the pain will likely return.  It takes 3-7 days for steroids to work in the sacroiliac area.  You may not notice any pain relief for at least that one week.  If effective, we will often do a series of three injections spaced 3-6 weeks apart to maximally decrease your pain.  After the initial series, we generally will wait some months before a repeat injection of the same type.  If you have any questions, please call [phone] [place]  COMPLICATIONS  What are the risk, side effects and possible complications? Generally speaking, most procedures are safe.  However, with any procedure there are risks, side effects, and the possibility of complications.  The risks and complications are dependent upon the sites that are lesioned, or the type of nerve block to be performed.  The closer the procedure is to the spine, the more serious the risks are.  Great care is taken when placing  the radio frequency needles, block needles or lesioning probes, but sometimes complications can occur. 1. Infection: Any time there is an injection through the skin, there is a risk of infection.  This is why sterile conditions are used for these blocks.  There are four possible types of infection. 1. Localized skin infection. 2. Central Nervous System Infection-This can be in the form of Meningitis, which can be deadly. 3. Epidural Infections-This can be in the form of an epidural abscess, which can cause pressure inside of the spine, causing compression of the spinal cord with subsequent paralysis. This would require an emergency surgery to decompress, and there are no guarantees that the patient would recover from the paralysis. 4. Discitis-This is an infection of the intervertebral discs.  It occurs in about 1% of discography procedures.  It is difficult to treat and it may lead to surgery.        2. Pain: the needles have to go through skin and soft tissues, will cause soreness.       3. Damage to internal structures:  The nerves to be lesioned may be near blood vessels or    other nerves which can be potentially damaged.       4. Bleeding: Bleeding is more common if the patient is taking blood thinners such as  aspirin, Coumadin, Ticiid, Plavix, etc., or if he/she have some genetic predisposition  such as hemophilia. Bleeding into the spinal canal can cause compression of the spinal  cord with subsequent paralysis.  This would require an emergency surgery to  decompress and there are no guarantees that the patient would recover from the  paralysis.       5. Pneumothorax:  Puncturing of a lung is a possibility, every time a needle is introduced in  the area of the chest or upper back.  Pneumothorax refers to free air around the  collapsed lung(s), inside of the thoracic cavity (chest cavity).  Another two possible  complications related to a similar event would include: Hemothorax and Chylothorax.    These are variations of the Pneumothorax, where instead of air around the collapsed  lung(s), you may have blood or chyle, respectively.       6. Spinal headaches: They may occur with any procedures in the area of the spine.       7. Persistent CSF (Cerebro-Spinal Fluid) leakage: This is a rare problem, but may occur  with prolonged intrathecal or epidural catheters either due to the formation of a fistulous  track or a dural tear.       8. Nerve damage: By working so close to the spinal cord, there is always a possibility of  nerve damage, which could be as serious as a permanent spinal cord injury with  paralysis.       9. Death:  Although rare, severe deadly allergic reactions known as "Anaphylactic  reaction" can occur to any of the medications used.      10. Worsening of the symptoms:  We can always make thing worse.  What  are the chances of something like this happening? Chances of any of this occuring are extremely low.  By statistics, you have more of a chance of getting killed in a motor vehicle accident: while driving to the hospital than any of the above occurring .  Nevertheless, you should be aware that they are possibilities.  In general, it is similar to taking a shower.  Everybody knows that you can slip, hit your head and get killed.  Does that mean that you should not shower again?  Nevertheless always keep in mind that statistics do not mean anything if you happen to be on the wrong side of them.  Even if a procedure has a 1 (one) in a 1,000,000 (million) chance of going wrong, it you happen to be that one..Also, keep in mind that by statistics, you have more of a chance of having something go wrong when taking medications.  Who should not have this procedure? If you are on a blood thinning medication (e.g. Coumadin, Plavix, see list of "Blood Thinners"), or if you have an active infection going on, you should not have the procedure.  If you are taking any blood thinners, please inform  your physician.  How should I prepare for this procedure?  Do not eat or drink anything at least six hours prior to the procedure.  Bring a driver with you .  It cannot be a taxi.  Come accompanied by an adult that can drive you back, and that is strong enough to help you if your legs get weak or numb from the local anesthetic.  Take all of your medicines the morning of the procedure with just enough water to swallow them.  If you have diabetes, make sure that you are scheduled to have your procedure done first thing in the morning, whenever possible.  If you have diabetes, take only half of your insulin dose and notify our nurse that you have done so as soon as you arrive at the clinic.  If you are diabetic, but only take blood sugar pills (oral hypoglycemic), then do not take them on the morning of your procedure.  You may take them after you have had the procedure.  Do not take aspirin or any aspirin-containing medications, at least eleven (11) days prior to the procedure.  They may prolong bleeding.  Wear loose fitting clothing that may be easy to take off and that you would not mind if it got stained with Betadine or blood.  Do not wear any jewelry or perfume  Remove any nail coloring.  It will interfere with some of our monitoring equipment.  NOTE: Remember that this is not meant to be interpreted as a complete list of all possible complications.  Unforeseen problems may occur.  BLOOD THINNERS The following drugs contain aspirin or other products, which can cause increased bleeding during surgery and should not be taken for 2 weeks prior to and 1 week after surgery.  If you should need take something for relief of minor pain, you may take acetaminophen which is found in Tylenol,m Datril, Anacin-3 and Panadol. It is not blood thinner. The products listed below are.  Do not take any of the products listed below in addition to any listed on your instruction sheet.  A.P.C or A.P.C  with Codeine Codeine Phosphate Capsules #3 Ibuprofen Ridaura  ABC compound Congesprin Imuran rimadil  Advil Cope Indocin Robaxisal  Alka-Seltzer Effervescent Pain Reliever and Antacid Coricidin or Coricidin-D  Indomethacin Rufen  Alka-Seltzer   plus Cold Medicine Cosprin Ketoprofen S-A-C Tablets  Anacin Analgesic Tablets or Capsules Coumadin Korlgesic Salflex  Anacin Extra Strength Analgesic tablets or capsules CP-2 Tablets Lanoril Salicylate  Anaprox Cuprimine Capsules Levenox Salocol  Anexsia-D Dalteparin Magan Salsalate  Anodynos Darvon compound Magnesium Salicylate Sine-off  Ansaid Dasin Capsules Magsal Sodium Salicylate  Anturane Depen Capsules Marnal Soma  APF Arthritis pain formula Dewitt's Pills Measurin Stanback  Argesic Dia-Gesic Meclofenamic Sulfinpyrazone  Arthritis Bayer Timed Release Aspirin Diclofenac Meclomen Sulindac  Arthritis pain formula Anacin Dicumarol Medipren Supac  Analgesic (Safety coated) Arthralgen Diffunasal Mefanamic Suprofen  Arthritis Strength Bufferin Dihydrocodeine Mepro Compound Suprol  Arthropan liquid Dopirydamole Methcarbomol with Aspirin Synalgos  ASA tablets/Enseals Disalcid Micrainin Tagament  Ascriptin Doan's Midol Talwin  Ascriptin A/D Dolene Mobidin Tanderil  Ascriptin Extra Strength Dolobid Moblgesic Ticlid  Ascriptin with Codeine Doloprin or Doloprin with Codeine Momentum Tolectin  Asperbuf Duoprin Mono-gesic Trendar  Aspergum Duradyne Motrin or Motrin IB Triminicin  Aspirin plain, buffered or enteric coated Durasal Myochrisine Trigesic  Aspirin Suppositories Easprin Nalfon Trillsate  Aspirin with Codeine Ecotrin Regular or Extra Strength Naprosyn Uracel  Atromid-S Efficin Naproxen Ursinus  Auranofin Capsules Elmiron Neocylate Vanquish  Axotal Emagrin Norgesic Verin  Azathioprine Empirin or Empirin with Codeine Normiflo Vitamin E  Azolid Emprazil Nuprin Voltaren  Bayer Aspirin plain, buffered or children's or timed BC Tablets or powders  Encaprin Orgaran Warfarin Sodium  Buff-a-Comp Enoxaparin Orudis Zorpin  Buff-a-Comp with Codeine Equegesic Os-Cal-Gesic   Buffaprin Excedrin plain, buffered or Extra Strength Oxalid   Bufferin Arthritis Strength Feldene Oxphenbutazone   Bufferin plain or Extra Strength Feldene Capsules Oxycodone with Aspirin   Bufferin with Codeine Fenoprofen Fenoprofen Pabalate or Pabalate-SF   Buffets II Flogesic Panagesic   Buffinol plain or Extra Strength Florinal or Florinal with Codeine Panwarfarin   Buf-Tabs Flurbiprofen Penicillamine   Butalbital Compound Four-way cold tablets Penicillin   Butazolidin Fragmin Pepto-Bismol   Carbenicillin Geminisyn Percodan   Carna Arthritis Reliever Geopen Persantine   Carprofen Gold's salt Persistin   Chloramphenicol Goody's Phenylbutazone   Chloromycetin Haltrain Piroxlcam   Clmetidine heparin Plaquenil   Cllnoril Hyco-pap Ponstel   Clofibrate Hydroxy chloroquine Propoxyphen         Before stopping any of these medications, be sure to consult the physician who ordered them.  Some, such as Coumadin (Warfarin) are ordered to prevent or treat serious conditions such as "deep thrombosis", "pumonary embolisms", and other heart problems.  The amount of time that you may need off of the medication may also vary with the medication and the reason for which you were taking it.  If you are taking any of these medications, please make sure you notify your pain physician before you undergo any procedures.          

## 2015-08-18 NOTE — Progress Notes (Signed)
Patient's Name: Wanda [name] MRN: [id number] DOB: 18-Oct-1945 DOS: 08/18/2015  Primary Reason(s) for Visit: Encounter for Medication Management CC: Back Pain   HPI  [name] is a 70 y.o. year old, female patient, who returns today as an established patient. She has Vitamin D deficiency; Dyslipidemia; Depression; Essential hypertension; ALLERGIC RHINITIS; TMJ PAIN; CLIMACTERIC STATE, FEMALE; OSTEOARTHRITIS; Annual physical exam; Leg cramps; Chronic pain; Chronic pain syndrome; Chronic low back pain (R>L); Lumbar spondylosis;  chronic lumbar radicular pain (Left) (S1 dermatome); Lumbar facet syndrome (Bilateral) (R>L); Long term current use of opiate analgesic; Long term prescription opiate use; Opiate use; Opiate dependence ([place]); Morbid obesity ([place]); Myofascial pain; Fibromyalgia; Chronic arthralgias of knees and hips; Osteoarthrosis; Grade 1 (24mm) Anterolisthesis of L3 over L4.; Lumbar intervertebral disc bulge (L2-3, L3-4, L4-5, and L6-S1); Lumbar foraminal stenosis (right-sided L3-4 with possible L3 nerve root compression); Encounter for therapeutic drug level monitoring; Encounter for chronic pain management; Chronic sacroiliac joint pain (Right); and Chronic radicular pain of lower extremity (Left) (S1 dermatome) on her problem list.. Her primarily concern today is the Back Pain   The patient returns to the clinics today for follow-up evaluation after her procedure on 07/06/2015. In addition, she comes in for pharmacological management of her chronic pain. She is doing well on her medications with no side effects. However, she continues to have pain in the lower back and lower extremity. She was supposed to have had an MRI done yesterday, but she could not keep the appointment because she took her husband to an appointment to treat his prostate cancer that apparently has metastasized. Today I have reordered that MRIs with that we can get it done as soon as possible. In addition, she did not  have her lab work done and we have sent her today to have it done as soon as possible.  In terms of her pain, the low back pain continues to be worse than the lower extremity pain. In the lower back, the pain is worse on the right side compared to the left. Physical exam today was positive for bilateral lumbar facet disease and right-sided SI joint pain. In addition, the patient has pain and the left lower extremity that goes all the way down to the bottom of her foot in what seems to be an S1 dermatomal distribution. She presents with numbness and pain in the bottom of the left foot.  Because her low back pain is worse, we will be scheduling her to come back for a right-sided diagnostic SI joint injection under fluoroscopic guidance. I have also given her the option of doing a left-sided lumbar epidural steroid injection under fluoroscopic guidance when necessary for the left leg pain. She wants to do the lower back first.  Reported Pain Score: 5  (last pain medication this a.m.) Reported level is inconsistent with clinical obrservations. Pain Location: Back Pain Orientation: Lower, Mid Pain Descriptors / Indicators: Aching, Constant Pain Frequency: Constant  Date of Last Visit: 07/06/15 Service Provided on Last Visit: Evaluation, Med Refill  Pharmacotherapy  Medication(s): The patient is currently taking Norco 5/325 one tablet by mouth every 6 hours when necessary for the pain. We have been prescribing this medication for the patient. Onset of action: Within expected pharmacological parameters Time to Peak effect: Timing and results are as within normal expected parameters Analgesic Effect: More than 50% Activity Facilitation: Medication(s) allow patient to sit, stand, walk, and do the basic ADLs Perceived Effectiveness: Described as relatively effective, allowing for increase in activities  of daily living (ADL) Side-effects or Adverse reactions: None reported Duration of action: Within  normal limits for medication [place] PMP: Compliant with practice rules and regulations UDS Results: The patient's last UDS done on 05/20/2015 came back within normal limits with no unexpected results. UDS Interpretation: Patient appears to be compliant with practice rules and regulations Medication Assessment Form: Reviewed. Patient indicates being compliant with therapy Treatment compliance: Compliant Substance Use Disorder (SUD) Risk Level: Low Pharmacologic Plan: Continue therapy as is  Lab Work: Illicit Drugs No results found for: THCU, COCAINSCRNUR, PCPSCRNUR, MDMA, AMPHETMU, METHADONE, ETOH  Inflammation Markers Lab Results  Component Value Date   ESRSEDRATE 28 08/18/2015   CRP <0.5 08/18/2015    Renal Function Lab Results  Component Value Date   BUN 17 08/18/2015   CREATININE 0.58 08/18/2015   GFRAA >60 08/18/2015   GFRNONAA >60 08/18/2015    Hepatic Function Lab Results  Component Value Date   AST 22 08/18/2015   ALT 25 08/18/2015   ALBUMIN 4.0 08/18/2015    Electrolytes Lab Results  Component Value Date   NA 139 08/18/2015   K 4.1 08/18/2015   CL 104 08/18/2015   CALCIUM 9.7 08/18/2015   MG 2.0 08/18/2015    Allergies  Ms. Belk is allergic to simvastatin.  Meds  The patient has a current medication list which includes the following prescription(s): aspirin ec, atorvastatin, vitamin d, cyclobenzaprine, gabapentin, hydrochlorothiazide, hydrocodone-acetaminophen, lisinopril, magnesium oxide, meloxicam, sertraline, hydrocodone-acetaminophen, and hydrocodone-acetaminophen.  Current Outpatient Prescriptions on File Prior to Visit  Medication Sig  . aspirin EC 81 MG tablet Take 81 mg by mouth daily.  [name] atorvastatin (LIPITOR) 10 MG tablet Take 1 tablet (10 mg total) by mouth at bedtime.  . Cholecalciferol (VITAMIN D) 2000 UNITS tablet Take 2,000 Units by mouth daily.  . hydrochlorothiazide (HYDRODIURIL) 25 MG tablet Take 1 tablet (25 mg total) by mouth daily.   [name] lisinopril (PRINIVIL,ZESTRIL) 10 MG tablet Take 1 tablet (10 mg total) by mouth daily.  . sertraline (ZOLOFT) 50 MG tablet Take 1.5 tablets (75 mg total) by mouth daily.   No current facility-administered medications on file prior to visit.    ROS  Constitutional: Afebrile, no chills, well hydrated and well nourished Gastrointestinal: negative Musculoskeletal:negative Neurological: negative Behavioral/Psych: negative  [place]  Medical:  Ms. Qui  has a past medical history of Allergy; Hyperlipidemia; Hypertension; DJD (degenerative joint disease); Depression; Menopause; and Chronic back pain. Family: family history includes Breast cancer in her sister; Cancer in her brother and sister; Colon cancer in her brother. There is no history of Coronary artery disease or CAD. Surgical:  has past surgical history that includes G3 P3 . Tobacco:  reports that she has never smoked. She has never used smokeless tobacco. Alcohol:  reports that she does not drink alcohol. Drug:  reports that she does not use illicit drugs.  Physical Exam  Vitals:  Today's Vitals   08/18/15 0924  BP: 121/90  Pulse: 64  Temp: 98.2 F (36.8 C)  TempSrc: Oral  Resp: 16  Height: 5\' 2"  (1.575 m)  Weight: 242 lb (109.77 kg)  SpO2: 96%  PainSc: 5     Calculated BMI: Body mass index is 44.25 kg/(m^2).  General appearance: alert, cooperative, appears stated age, mild distress and morbidly obese Eyes: PERLA Respiratory: No evidence respiratory distress, no audible rales or ronchi and no use of accessory muscles of respiration  Cervical Spine Inspection: Normal anatomy Alignment: Symetrical ROM: Adequate  Upper Extremities Inspection: No gross  anomalies detected ROM: Adequate Sensory: Normal Motor: Unremarkable  Thoracic Spine Inspection: No gross anomalies detected Alignment: Symetrical ROM: Adequate Palpation: WNL  Lumbar Spine Inspection: No gross anomalies detected Alignment:  Symetrical ROM: Decreased Palpation: Tender Provocative Tests: Lumbar Hyperextension and rotation test: Positive bilaterally with the right being worst on the left. Patrick's Maneuver: Positive on the right. Gait: Antalgic (limping)  Lower Extremities Inspection: No gross anomalies detected ROM: Adequate Sensory: Normal Motor: Grossly normal  Toe walk (S1): WNL  Heal walk (L5): WNL  Assessment & Plan  Primary Diagnosis & Pertinent Problem List: The primary encounter diagnosis was Chronic pain. Diagnoses of Long term prescription opiate use, Encounter for therapeutic drug level monitoring, Encounter for chronic pain management, Chronic sacroiliac joint pain (Right), Chronic radicular pain of lower extremity (Left) (S1 dermatome), and Lumbar facet syndrome (Bilateral, R>L) were also pertinent to this visit.  Visit Diagnosis: 1. Chronic pain   2. Long term prescription opiate use   3. Encounter for therapeutic drug level monitoring   4. Encounter for chronic pain management   5. Chronic sacroiliac joint pain (Right)   6. Chronic radicular pain of lower extremity (Left) (S1 dermatome)   7. Lumbar facet syndrome (Bilateral, R>L)     Assessment: No problem-specific assessment & plan notes found for this encounter.   Plan of Care  Pharmacotherapy (Medications Ordered): Meds ordered this encounter  Medications  . cyclobenzaprine (FLEXERIL) 10 MG tablet    Sig: Take 1 tablet (10 mg total) by mouth 2 (two) times daily as needed for muscle spasms.    Dispense:  60 tablet    Refill:  2    Do not place this medication, or any other prescription from our practice, on "Automatic Refill". Patient may have prescription filled one day early if pharmacy is closed on scheduled refill date.  . gabapentin (NEURONTIN) 100 MG capsule    Sig: Take 1 capsule (100 mg total) by mouth 3 (three) times daily.    Dispense:  90 capsule    Refill:  2    Do not place this medication, or any other  prescription from our practice, on "Automatic Refill". Patient may have prescription filled one day early if pharmacy is closed on scheduled refill date.  [name] HYDROcodone-acetaminophen (NORCO/VICODIN) 5-325 MG tablet    Sig: Take 1 tablet by mouth every 6 (six) hours as needed for moderate pain.    Dispense:  120 tablet    Refill:  0    Do not place this medication, or any other prescription from our practice, on "Automatic Refill". Patient may have prescription filled one day early if pharmacy is closed on scheduled refill date. Do not fill until: 08/18/15 To last until: 09/17/15  . magnesium oxide (MAG-OX) 400 MG tablet    Sig: Take 1 tablet (400 mg total) by mouth daily.    Dispense:  30 tablet    Refill:  2    Do not place this medication, or any other prescription from our practice, on "Automatic Refill". Patient may have prescription filled one day early if pharmacy is closed on scheduled refill date.  . meloxicam (MOBIC) 15 MG tablet    Sig: Take 1 tablet (15 mg total) by mouth daily.    Dispense:  30 tablet    Refill:  2    Do not place this medication, or any other prescription from our practice, on "Automatic Refill". Patient may have prescription filled one day early if pharmacy is closed on scheduled refill  date.  [name] HYDROcodone-acetaminophen (NORCO/VICODIN) 5-325 MG tablet    Sig: Take 1 tablet by mouth every 6 (six) hours as needed for moderate pain or severe pain.    Dispense:  120 tablet    Refill:  0    Do not place this medication, or any other prescription from our practice, on "Automatic Refill". Patient may have prescription filled one day early if pharmacy is closed on scheduled refill date. Do not fill until: 09/17/15 To last until: 10/14/15  . HYDROcodone-acetaminophen (NORCO/VICODIN) 5-325 MG tablet    Sig: Take 1 tablet by mouth every 6 (six) hours as needed for moderate pain or severe pain.    Dispense:  120 tablet    Refill:  0    Do not place this medication, or  any other prescription from our practice, on "Automatic Refill". Patient may have prescription filled one day early if pharmacy is closed on scheduled refill date. Do not fill until: 10/14/15 To last until: 11/13/15    [facility] & Procedure Ordered: Orders Placed This Encounter  Procedures  . SACROILIAC JOINT INJECTINS    Standing Status: Future     Number of Occurrences:      Standing Expiration Date: 08/17/2016    Scheduling Instructions:     Side: Right-sided     Sedation: No Sedation.     Timeframe: ASAA    Order Specific Question:  Where will this procedure be performed?    Answer:  [facility]  . LUMBAR EPIDURAL STEROID INJECTION    Standing Status: Standing     Number of Occurrences: 1     Standing Expiration Date: 08/17/2016    Scheduling Instructions:     Side: Left-sided (L4-5)     Sedation: No Sedation.     Timeframe: PRN Procedure. Patient will call to schedule.    Order Specific Question:  Where will this procedure be performed?    Answer:  [facility]  . LUMBAR FACET(MEDIAL BRANCH NERVE BLOCK) MBNB    Standing Status: Standing     Number of Occurrences: 1     Standing Expiration Date: 08/17/2016    Scheduling Instructions:     Side: Bilateral     Level: L2, L3, L4, L5, & S1 Medial Branch Nerve     Sedation: With Sedation.     Timeframe: PRN Procedure. Patient will call to schedule.    Order Specific Question:  Where will this procedure be performed?    Answer:  [facility]  . MR Lumbar Spine Wo Contrast    Standing Status: Future     Number of Occurrences:      Standing Expiration Date: 08/17/2016    Scheduling Instructions:     Please provide canal diameter in millimeters when describing any spinal stenosis. The patient is experiencing radicular pain down the left lower extremity over the S1 dermatome. Please evaluate for pathology that may be responsible for the patient's symptoms.    Order Specific Question:  Reason for Exam (SYMPTOM   OR DIAGNOSIS REQUIRED)    Answer:  Lumbar radiculopathy/radiculitis    Order Specific Question:  Preferred imaging location?    Answer:  [facility]    Order Specific Question:  Does the patient have a pacemaker or implanted devices?    Answer:  No    Order Specific Question:  What is the patient's sedation requirement?    Answer:  No Sedation    Order Specific Question:  Call Results- Best Contact Number?  Answer:  [id number] (Pain Clinic facility) (Dr. Dossie Arbour)  . Drugs of abuse screen w/o alc, rtn urine-sln    Volume: 10 ml(s). Minimum 3 ml of urine is needed. Document temperature of fresh sample. Indications: Long term (current) use of opiate analgesic (Z79.891) Test#: [id number] ([employer] Select-13)  . Comprehensive metabolic panel    Order Specific Question:  Has the patient fasted?    Answer:  No  . C-reactive protein  . Magnesium  . Sedimentation rate  . Vitamin B12    Indication: Bone Pain (M89.9)  . Vitamin D pnl(25-hydrxy+1,25-dihy)-bld    Imaging Ordered: MR LUMBAR SPINE WO CONTRAST  Interventional Therapies: Scheduled: Diagnostic, right-sided, sacroiliac joint block under fluoroscopic guidance, without sedation. PRN Procedures: Palliative, left-sided L4-5 lumbar epidural steroid injection under fluoroscopic guidance, no sedation. This would be for problems with the left leg pain. For the lower back, we can also do palliative bilateral lumbar facet blocks under fluoroscopic guidance and IV sedation.    Referral(s) or Consult(s): None at this point, but we may consider referral to an orthopedic surgeon on neurosurgeon, the pending on the results of the MRI.  Medications administered during this visit: Ms. Steagall had no medications administered during this visit.  Future Appointments Date Time Provider [place]  08/26/2015 10:20 AM Milinda Pointer, MD [facility] None  08/30/2015 5:00 PM [facility] 1 [facility] [place]    Primary Care Physician: Kathlene November, MD Location: [facility] Facility Note by: Kathlen Brunswick. Dossie Arbour, M.D, DABA, DABAPM, DABPM, DABIPP, FIPP

## 2015-08-18 NOTE — Progress Notes (Signed)
Patient here for medication refill.  She was unable to keep her post procedure evaluation d/t some complications going on with her husband.    Patient is having additional numbness in her Left leg that was caused initially from a back injury.  She would like you to evaluate today.  Patient does not have pills for count with her.

## 2015-08-19 ENCOUNTER — Ambulatory Visit (HOSPITAL_COMMUNITY): Payer: Commercial Managed Care - HMO

## 2015-08-26 ENCOUNTER — Ambulatory Visit: Payer: Commercial Managed Care - HMO | Admitting: Pain Medicine

## 2015-08-26 LAB — TOXASSURE SELECT 13 (MW), URINE: PDF: 0

## 2015-08-30 ENCOUNTER — Other Ambulatory Visit: Payer: Self-pay | Admitting: Pain Medicine

## 2015-08-30 ENCOUNTER — Ambulatory Visit (HOSPITAL_COMMUNITY)
Admission: RE | Admit: 2015-08-30 | Discharge: 2015-08-30 | Disposition: A | Discharge status: Home / Self Care | Payer: Commercial Managed Care - HMO | Source: Ambulatory Visit | Attending: Pain Medicine | Admitting: Pain Medicine

## 2015-08-30 DIAGNOSIS — M5416 Radiculopathy, lumbar region: Principal | ICD-10-CM

## 2015-08-30 DIAGNOSIS — G8929 Other chronic pain: Secondary | ICD-10-CM

## 2015-09-09 ENCOUNTER — Ambulatory Visit (INDEPENDENT_AMBULATORY_CARE_PROVIDER_SITE_OTHER): Payer: Commercial Managed Care - HMO | Admitting: Medical

## 2015-09-09 ENCOUNTER — Encounter: Payer: Self-pay | Admitting: Medical

## 2015-09-09 VITALS — BP 120/70 | HR 77 | Temp 98.1°F | Ht 64.0 in | Wt 243.0 lb

## 2015-09-09 DIAGNOSIS — R52 Pain, unspecified: Secondary | ICD-10-CM

## 2015-09-09 DIAGNOSIS — R509 Fever, unspecified: Secondary | ICD-10-CM | POA: Diagnosis not present

## 2015-09-09 DIAGNOSIS — H6692 Otitis media, unspecified, left ear: Secondary | ICD-10-CM | POA: Diagnosis not present

## 2015-09-09 DIAGNOSIS — J111 Influenza due to unidentified influenza virus with other respiratory manifestations: Secondary | ICD-10-CM

## 2015-09-09 LAB — POCT INFLUENZA A/B
INFLUENZA B, POC: NEGATIVE
Influenza A, POC: NEGATIVE

## 2015-09-09 MED ORDER — FLUTICASONE PROPIONATE 50 MCG/ACT NA SUSP: 2.0000 | Freq: Every day | NASAL | Status: DC

## 2015-09-09 MED ORDER — OSELTAMIVIR PHOSPHATE 75 MG PO CAPS: 75.0000 mg | ORAL_CAPSULE | Freq: Two times a day (BID) | ORAL | Status: DC

## 2015-09-09 MED ORDER — BENZONATATE 100 MG PO CAPS: 100.0000 mg | ORAL_CAPSULE | Freq: Three times a day (TID) | ORAL | Status: DC | PRN

## 2015-09-09 MED ORDER — AZITHROMYCIN 250 MG PO TABS: ORAL_TABLET | ORAL | Status: DC

## 2015-09-09 NOTE — Patient Instructions (Addendum)
You appear to have the flu even though your  rapid flu test was negative.(Important to note sometimes flu test can be falsely negative.) Therefore,  I am treating you with tamiflu based on your clinical presentation. Rest, hydrate and take ibuprofen  if necessary for body ache or fever. You also appear to have lt ear infection so rx azithromycin antibiotic.  For cough benzonatate.  For nasal congestion flonase.  Follow up in 7 days pcp or as needed

## 2015-09-09 NOTE — Progress Notes (Signed)
Subjective:    Patient ID: Wanda [name], female    DOB: Oct 22, 1945, 70 y.o.   MRN: [id number]  HPI   Pt in states 3 days ago got sever achy in muscles all over(quick onset). Then fevers, chills and real fatigued. Pt has some cough. Today cough feels productive. Some nasal congestion. Pt knows of no know flu contact.    Review of Systems  Constitutional: Positive for fever and fatigue.  HENT: Positive for congestion and sinus pressure. Negative for ear pain, sore throat, tinnitus and trouble swallowing.   Respiratory: Positive for cough.   Cardiovascular: Negative for chest pain and palpitations.  Musculoskeletal: Positive for myalgias.  Skin: Negative for rash.  Neurological: Negative for dizziness and headaches.  Hematological: Negative for adenopathy. Does not bruise/bleed easily.  Psychiatric/Behavioral: Negative for behavioral problems and confusion.     Past Medical History  Diagnosis Date  . Allergy     RHINITIS  . Hyperlipidemia   . Hypertension   . DJD (degenerative joint disease)     back pain , on disability  . Depression   . Menopause     onset age 88   . Chronic back pain     Social History   Social History  . Marital Status: Married    Spouse Name: N/A  . Number of Children: 3  . Years of Education: N/A   Occupational History  . ON DISABILITY    Social History Main Topics  . Smoking status: Never Smoker   . Smokeless tobacco: Never Used  . Alcohol Use: No  . Drug Use: No  . Sexual Activity: Not on file   Other Topics Concern  . Not on file   Social History Narrative   Married, 3 kids, 6 G-kids     Past Surgical History  Procedure Laterality Date  . G3 p3       Family History  Problem Relation Age of Onset  . Cancer Sister     ??CERVICAL  . Cancer Brother     STOMACH  . Breast cancer Sister     sister dx age 40, aunt, cousin x 2   . Colon cancer Brother     dx age ~ 8 ?  [name] Coronary artery disease Neg Hx   . Diabetes        aunt   . CAD Neg Hx     Allergies  Allergen Reactions  . Simvastatin     REACTION: cramps    Current Outpatient Prescriptions on File Prior to Visit  Medication Sig Dispense Refill  . aspirin EC 81 MG tablet Take 81 mg by mouth daily.    [name] atorvastatin (LIPITOR) 10 MG tablet Take 1 tablet (10 mg total) by mouth at bedtime. 30 tablet 6  . Cholecalciferol (VITAMIN D) 2000 UNITS tablet Take 2,000 Units by mouth daily.    . cyclobenzaprine (FLEXERIL) 10 MG tablet Take 1 tablet (10 mg total) by mouth 2 (two) times daily as needed for muscle spasms. 60 tablet 2  . gabapentin (NEURONTIN) 100 MG capsule Take 1 capsule (100 mg total) by mouth 3 (three) times daily. 90 capsule 2  . hydrochlorothiazide (HYDRODIURIL) 25 MG tablet Take 1 tablet (25 mg total) by mouth daily. 30 tablet 6  . HYDROcodone-acetaminophen (NORCO/VICODIN) 5-325 MG tablet Take 1 tablet by mouth every 6 (six) hours as needed for moderate pain. 120 tablet 0  . HYDROcodone-acetaminophen (NORCO/VICODIN) 5-325 MG tablet Take 1 tablet by mouth every 6 (six)  hours as needed for moderate pain or severe pain. 120 tablet 0  . lisinopril (PRINIVIL,ZESTRIL) 10 MG tablet Take 1 tablet (10 mg total) by mouth daily. 30 tablet 6  . magnesium oxide (MAG-OX) 400 MG tablet Take 1 tablet (400 mg total) by mouth daily. 30 tablet 2  . meloxicam (MOBIC) 15 MG tablet Take 1 tablet (15 mg total) by mouth daily. 30 tablet 2  . sertraline (ZOLOFT) 50 MG tablet Take 1.5 tablets (75 mg total) by mouth daily. 45 tablet 6   No current facility-administered medications on file prior to visit.    BP 120/70 mmHg  Pulse 77  Temp(Src) 98.1 F (36.7 C) (Oral)  Ht 5\' 4"  (1.626 m)  Wt 243 lb (110.224 kg)  BMI 41.69 kg/m2  SpO2 98%       Objective:   Physical Exam  General  Mental Status - Alert. General Appearance - Well groomed. Not in acute distress.  Skin Rashes- No Rashes.  HEENT Head- Normal. Ear Auditory Canal - Left- Normal. Right  - Normal.Tympanic Membrane- Left- red. Right- Normal. Eye Sclera/Conjunctiva- Left- Normal. Right- Normal. Nose & Sinuses Nasal Mucosa- Left-  Boggy and Congested. Right-  Boggy and  Congested.Bilateral no maxillary and no  frontal sinus pressure. Mouth & Throat Lips: Upper Lip- Normal: no dryness, cracking, pallor, cyanosis, or vesicular eruption. Lower Lip-Normal: no dryness, cracking, pallor, cyanosis or vesicular eruption. Buccal Mucosa- Bilateral- No Aphthous ulcers. Oropharynx- No Discharge or Erythema. Tonsils: Characteristics- Bilateral- No Erythema or Congestion. Size/Enlargement- Bilateral- No enlargement. Discharge- bilateral-None.  Neck Neck- Supple. No Masses.   Chest and Lung Exam Auscultation: Breath Sounds:-Clear even and unlabored.  Cardiovascular Auscultation:Rythm- Regular, rate and rhythm. Murmurs & Other Heart Sounds:Ausculatation of the heart reveal- No Murmurs.  Lymphatic Head & Neck General Head & Neck Lymphatics: Bilateral: Description- No Localized lymphadenopathy.       Assessment & Plan:  You appear to have the flu even though your  rapid flu test was negative.(Important to note sometimes flu test can be falsely negative.) Therefore,  I am treating you with tamiflu based on your clinical presentation. Rest, hydrate and take ibuprofen  if necessary for body ache or fever. You also appear to have lt ear infection so rx azithromycin antibiotic.  For cough benzonatate.  For nasal congestion flonase.  Follow up in 7 days pcp or as needed

## 2015-09-09 NOTE — Progress Notes (Signed)
Pre visit review using our clinic review tool, if applicable. No additional management support is needed unless otherwise documented below in the visit note. 

## 2015-09-28 ENCOUNTER — Encounter: Payer: Self-pay | Admitting: Physician Assistant

## 2015-09-28 ENCOUNTER — Ambulatory Visit (INDEPENDENT_AMBULATORY_CARE_PROVIDER_SITE_OTHER): Payer: Commercial Managed Care - HMO | Admitting: Physician Assistant

## 2015-09-28 VITALS — BP 102/68 | HR 71 | Temp 98.0°F | Ht 64.0 in | Wt 246.2 lb

## 2015-09-28 DIAGNOSIS — B029 Zoster without complications: Secondary | ICD-10-CM

## 2015-09-28 MED ORDER — ACYCLOVIR 800 MG PO TABS: 800.0000 mg | ORAL_TABLET | Freq: Every day | ORAL | Status: DC

## 2015-09-28 NOTE — Assessment & Plan Note (Signed)
Rx Acyclovir 800 mg 5 x d for 7 days. Supportive measures and pain management reviewed. Prognosis reviewed. Follow-up if anything is not improving.

## 2015-09-28 NOTE — Progress Notes (Signed)
  Patient presents to clinic today c/o painful and itchy rash of her lower left side wrapping to her mid lower back. Denies rash of right side. Patient denies history of shingles. Has had her shingles shot. Denies change to hygiene products or lotions.  Past Medical History  Diagnosis Date  . Allergy     RHINITIS  . Hyperlipidemia   . Hypertension   . DJD (degenerative joint disease)     back pain , on disability  . Depression   . Menopause     onset age 44   . Chronic back pain     Current Outpatient Prescriptions on File Prior to Visit  Medication Sig Dispense Refill  . aspirin EC 81 MG tablet Take 81 mg by mouth daily.    . atorvastatin (LIPITOR) 10 MG tablet Take 1 tablet (10 mg total) by mouth at bedtime. 30 tablet 6  . Cholecalciferol (VITAMIN D) 2000 UNITS tablet Take 2,000 Units by mouth daily.    . cyclobenzaprine (FLEXERIL) 10 MG tablet Take 1 tablet (10 mg total) by mouth 2 (two) times daily as needed for muscle spasms. 60 tablet 2  . gabapentin (NEURONTIN) 100 MG capsule Take 1 capsule (100 mg total) by mouth 3 (three) times daily. 90 capsule 2  . hydrochlorothiazide (HYDRODIURIL) 25 MG tablet Take 1 tablet (25 mg total) by mouth daily. 30 tablet 6  . HYDROcodone-acetaminophen (NORCO/VICODIN) 5-325 MG tablet Take 1 tablet by mouth every 6 (six) hours as needed for moderate pain. 120 tablet 0  . lisinopril (PRINIVIL,ZESTRIL) 10 MG tablet Take 1 tablet (10 mg total) by mouth daily. 30 tablet 6  . magnesium oxide (MAG-OX) 400 MG tablet Take 1 tablet (400 mg total) by mouth daily. 30 tablet 2  . sertraline (ZOLOFT) 50 MG tablet Take 1.5 tablets (75 mg total) by mouth daily. 45 tablet 6   No current facility-administered medications on file prior to visit.    Allergies  Allergen Reactions  . Simvastatin     REACTION: cramps    Family History  Problem Relation Age of Onset  . Cancer Sister     ??CERVICAL  . Cancer Brother     STOMACH  . Breast cancer Sister    sister dx age 55, aunt, cousin x 2   . Colon cancer Brother     dx age ~ 55 ?  . Coronary artery disease Neg Hx   . Diabetes      aunt   . CAD Neg Hx     Social History   Social History  . Marital Status: Married    Spouse Name: N/A  . Number of Children: 3  . Years of Education: N/A   Occupational History  . ON DISABILITY    Social History Main Topics  . Smoking status: Never Smoker   . Smokeless tobacco: Never Used  . Alcohol Use: No  . Drug Use: No  . Sexual Activity: Not Asked   Other Topics Concern  . None   Social History Narrative   Married, 3 kids, 6 G-kids    Review of Systems - See HPI.  All other ROS are negative.  BP 102/68 mmHg  Pulse 71  Temp(Src) 98 F (36.7 C) (Oral)  Ht 5' 4" (1.626 m)  Wt 246 lb 3.2 oz (111.676 kg)  BMI 42.24 kg/m2  SpO2 98%  Physical Exam  Constitutional: She is oriented to person, place, and time and well-developed, well-nourished, and in no distress.  HENT:    Head: Normocephalic and atraumatic.  Cardiovascular: Normal rate, regular rhythm, normal heart sounds and intact distal pulses.   Pulmonary/Chest: Effort normal and breath sounds normal. No respiratory distress. She has no wheezes. She has no rales. She exhibits no tenderness.  Neurological: She is alert and oriented to person, place, and time.  Skin: Skin is warm and dry.     Psychiatric: Affect normal.  Vitals reviewed.   Recent Results (from the past 2160 hour(s))  Basic metabolic panel     Status: None   Collection Time: 08/10/15  9:05 AM  Result Value Ref Range   Sodium 139 135 - 145 mEq/L   Potassium 4.4 3.5 - 5.1 mEq/L   Chloride 105 96 - 112 mEq/L   CO2 29 19 - 32 mEq/L   Glucose, Bld 98 70 - 99 mg/dL   BUN 14 6 - 23 mg/dL   Creatinine, Ser 0.67 0.40 - 1.20 mg/dL   Calcium 9.5 8.4 - 10.5 mg/dL   GFR 112.86 >60.00 mL/min  ToxASSURE Select 13 (MW), Urine     Status: None   Collection Time: 08/18/15  9:26 AM  Result Value Ref Range   Report  Summary FINAL     Comment: ==================================================================== TOXASSURE SELECT 13 (MW) ==================================================================== Test                             Result       Flag       Units Drug Present and Declared for Prescription Verification   Hydrocodone                    741          EXPECTED   ng/mg creat   Hydromorphone                  143          EXPECTED   ng/mg creat   Dihydrocodeine                 38           EXPECTED   ng/mg creat   Norhydrocodone                 999          EXPECTED   ng/mg creat    Sources of hydrocodone include scheduled prescription    medications. Hydromorphone, dihydrocodeine and norhydrocodone are    expected metabolites of hydrocodone. Hydromorphone and    dihydrocodeine are also available as scheduled prescription    medications. ==================================================================== Test                      Result    Flag   Units      Ref Range   Creatinine              172               mg/dL      >=20 ==================================================================== Declared Medications:  The flagging and interpretation on this report are based on the  following declared medications.  Unexpected results may arise from  inaccuracies in the declared medications.  **Note: The testing scope of this panel includes these medications:  Hydrocodone  **Note: The testing scope of this panel does not include following  reported medications:  Gabapentin ==================================================================== For clinical consultation, please call [phone]. ====================================================================    PDF .  Comprehensive metabolic panel     Status: None   Collection Time: 08/18/15 11:37 AM  Result Value Ref Range   Sodium 139 135 - 145 mmol/L   Potassium 4.1 3.5 - 5.1 mmol/L   Chloride 104 101 - 111 mmol/L   CO2 29  22 - 32 mmol/L   Glucose, Bld 91 65 - 99 mg/dL   BUN 17 6 - 20 mg/dL   Creatinine, Ser 0.58 0.44 - 1.00 mg/dL   Calcium 9.7 8.9 - 10.3 mg/dL   Total Protein 7.5 6.5 - 8.1 g/dL   Albumin 4.0 3.5 - 5.0 g/dL   AST 22 15 - 41 U/L   ALT 25 14 - 54 U/L   Alkaline Phosphatase 80 38 - 126 U/L   Total Bilirubin 0.6 0.3 - 1.2 mg/dL   GFR calc non Af Amer >60 >60 mL/min   GFR calc Af Amer >60 >60 mL/min    Comment: (NOTE) The eGFR has been calculated using the CKD EPI equation. This calculation has not been validated in all clinical situations. eGFR's persistently <60 mL/min signify possible Chronic Kidney Disease.    Anion gap 6 5 - 15  C-reactive protein     Status: None   Collection Time: 08/18/15 11:37 AM  Result Value Ref Range   CRP <0.5 <1.0 mg/dL    Comment: Performed at [facility]  Magnesium     Status: None   Collection Time: 08/18/15 11:37 AM  Result Value Ref Range   Magnesium 2.0 1.7 - 2.4 mg/dL  Sedimentation rate     Status: None   Collection Time: 08/18/15 11:37 AM  Result Value Ref Range   Sed Rate 28 0 - 30 mm/hr  POCT Influenza A/B     Status: None   Collection Time: 09/09/15 11:33 AM  Result Value Ref Range   Influenza A, POC Negative Negative   Influenza B, POC Negative Negative    Assessment/Plan: Herpes zoster Rx Acyclovir 800 mg 5 x d for 7 days. Supportive measures and pain management reviewed. Prognosis reviewed. Follow-up if anything is not improving.     

## 2015-09-28 NOTE — Patient Instructions (Signed)
Please keep the lesions covered until they heal. Continue pain medications. Take the acyclovir as directed. This will help speed up recovery.  Call or return to clinic if symptoms are not improving or if new symptoms develop.  Shingles Shingles is an infection that causes a painful skin rash and fluid-filled blisters. Shingles is caused by the same virus that causes chickenpox. Shingles only develops in people who:  Have had chickenpox.  Have gotten the chickenpox vaccine. (This is rare.) The first symptoms of shingles may be itching, tingling, or pain in an area on your skin. A rash will follow in a few days or weeks. The rash is usually on one side of the body in a bandlike or beltlike pattern. Over time, the rash turns into fluid-filled blisters that break open, scab over, and dry up. Medicines may:  Help you manage pain.  Help you recover more quickly.  Help to prevent long-term problems. HOME CARE Medicines  Take medicines only as told by your doctor.  Apply an anti-itch or numbing cream to the affected area as told by your doctor. Blister and Rash Care  Take a cool bath or put cool compresses on the area of the rash or blisters as told by your doctor. This may help with pain and itching.  Keep your rash covered with a loose bandage (dressing). Wear loose-fitting clothing.  Keep your rash and blisters clean with mild soap and cool water or as told by your doctor.  Check your rash every day for signs of infection. These include redness, swelling, and pain that lasts or gets worse.  Do not pick your blisters.  Do not scratch your rash. General Instructions  Rest as told by your doctor.  Keep all follow-up visits as told by your doctor. This is important.  Until your blisters scab over, your infection can cause chickenpox in people who have never had it or been vaccinated against it. To prevent this from happening, avoid touching other people or being around other  people, especially:  Babies.  Pregnant women.  Children who have eczema.  Elderly people who have transplants.  People who have chronic illnesses, such as leukemia or AIDS. GET HELP IF:  Your pain does not get better with medicine.  Your pain does not get better after the rash heals.  Your rash looks infected. Signs of infection include:  Redness.  Swelling.  Pain that lasts or gets worse. GET HELP RIGHT AWAY IF:  The rash is on your face or nose.  You have pain in your face, pain around your eye area, or loss of feeling on one side of your face.  You have ear pain or you have ringing in your ear.  You have loss of taste.  Your condition gets worse.   This information is not intended to replace advice given to you by your health care provider. Make sure you discuss any questions you have with your health care provider.   Document Released: 12/27/2007 Document Revised: 07/31/2014 Document Reviewed: 04/21/2014 Elsevier Interactive Patient Education [employer].

## 2015-09-28 NOTE — Progress Notes (Signed)
Pre visit review using our clinic review tool, if applicable. No additional management support is needed unless otherwise documented below in the visit note. 

## 2015-11-09 ENCOUNTER — Other Ambulatory Visit: Payer: Self-pay

## 2015-11-09 DIAGNOSIS — Z1231 Encounter for screening mammogram for malignant neoplasm of breast: Secondary | ICD-10-CM

## 2015-11-22 ENCOUNTER — Ambulatory Visit
Admission: RE | Admit: 2015-11-22 | Discharge: 2015-11-22 | Disposition: A | Discharge status: Home / Self Care | Payer: Commercial Managed Care - HMO | Source: Ambulatory Visit

## 2015-11-22 DIAGNOSIS — Z1231 Encounter for screening mammogram for malignant neoplasm of breast: Secondary | ICD-10-CM

## 2015-11-24 ENCOUNTER — Telehealth: Payer: Self-pay | Admitting: Internal Medicine

## 2015-11-24 ENCOUNTER — Other Ambulatory Visit: Payer: Self-pay | Admitting: Internal Medicine

## 2015-11-24 DIAGNOSIS — R928 Other abnormal and inconclusive findings on diagnostic imaging of breast: Secondary | ICD-10-CM

## 2015-11-24 NOTE — Telephone Encounter (Signed)
Relationship to patient: self Can be reached: [phone] Pharmacy: [place], [place] [facility]  Reason for call: pt feels that the zoloft (taking 1 1/2 per day) is not enough for her. She is going thru a hard time and her husband has cancer. She has not been well herself. She states that she is crying all the time. Pt ok to come in for appt if needed, but states she was advised to call if zoloft not working for her. Pt has a temporary phone # listed above.

## 2015-11-24 NOTE — Telephone Encounter (Signed)
Called and [facility] @ 7:13pm @ [phone]) informing the pt of the note below.  Asked the pt to please give [place] a call back on tomorrow to let us know she received the message and if she has any questions or concerns.//AB/CMA

## 2015-11-24 NOTE — Telephone Encounter (Signed)
Current dose of Zoloft 50 mg 1.5 tablets daily advise patient: Take 2 tablets daily, schedule appointment to see me within the next few days. If severe symptoms or suicidal thoughts: Go to the ER

## 2015-11-24 NOTE — Telephone Encounter (Signed)
Please advise.//AB/CMA 

## 2015-11-25 NOTE — Telephone Encounter (Signed)
Called the patient today to confirm message received.  She states receiving message and did verbally understand and agreed to.

## 2015-11-29 ENCOUNTER — Telehealth: Payer: Self-pay | Admitting: *Deleted

## 2015-11-29 NOTE — Telephone Encounter (Signed)
Received Order form for Mammogram; forwarded to provider/SLS 05/08

## 2015-12-06 ENCOUNTER — Ambulatory Visit
Admission: RE | Admit: 2015-12-06 | Discharge: 2015-12-06 | Disposition: A | Discharge status: Home / Self Care | Payer: Commercial Managed Care - HMO | Source: Ambulatory Visit | Attending: Internal Medicine | Admitting: Internal Medicine

## 2015-12-06 DIAGNOSIS — R921 Mammographic calcification found on diagnostic imaging of breast: Secondary | ICD-10-CM | POA: Diagnosis not present

## 2015-12-06 DIAGNOSIS — R928 Other abnormal and inconclusive findings on diagnostic imaging of breast: Secondary | ICD-10-CM

## 2015-12-07 ENCOUNTER — Telehealth: Payer: Self-pay | Admitting: *Deleted

## 2015-12-07 ENCOUNTER — Ambulatory Visit: Payer: Commercial Managed Care - HMO | Attending: Pain Medicine | Admitting: Pain Medicine

## 2015-12-07 ENCOUNTER — Encounter: Payer: Self-pay | Admitting: Pain Medicine

## 2015-12-07 VITALS — BP 133/70 | HR 65 | Temp 98.2°F | Resp 16 | Ht 63.0 in | Wt 240.0 lb

## 2015-12-07 DIAGNOSIS — B029 Zoster without complications: Secondary | ICD-10-CM | POA: Insufficient documentation

## 2015-12-07 DIAGNOSIS — R252 Cramp and spasm: Secondary | ICD-10-CM | POA: Insufficient documentation

## 2015-12-07 DIAGNOSIS — M791 Myalgia: Secondary | ICD-10-CM

## 2015-12-07 DIAGNOSIS — J309 Allergic rhinitis, unspecified: Secondary | ICD-10-CM | POA: Diagnosis not present

## 2015-12-07 DIAGNOSIS — Z780 Asymptomatic menopausal state: Secondary | ICD-10-CM | POA: Diagnosis not present

## 2015-12-07 DIAGNOSIS — M4316 Spondylolisthesis, lumbar region: Secondary | ICD-10-CM | POA: Diagnosis not present

## 2015-12-07 DIAGNOSIS — M792 Neuralgia and neuritis, unspecified: Secondary | ICD-10-CM

## 2015-12-07 DIAGNOSIS — M549 Dorsalgia, unspecified: Secondary | ICD-10-CM | POA: Diagnosis present

## 2015-12-07 DIAGNOSIS — E559 Vitamin D deficiency, unspecified: Secondary | ICD-10-CM | POA: Diagnosis not present

## 2015-12-07 DIAGNOSIS — M797 Fibromyalgia: Secondary | ICD-10-CM | POA: Insufficient documentation

## 2015-12-07 DIAGNOSIS — Z79891 Long term (current) use of opiate analgesic: Secondary | ICD-10-CM | POA: Insufficient documentation

## 2015-12-07 DIAGNOSIS — Z5181 Encounter for therapeutic drug level monitoring: Secondary | ICD-10-CM | POA: Diagnosis not present

## 2015-12-07 DIAGNOSIS — G8929 Other chronic pain: Secondary | ICD-10-CM | POA: Insufficient documentation

## 2015-12-07 DIAGNOSIS — M545 Low back pain: Secondary | ICD-10-CM | POA: Diagnosis not present

## 2015-12-07 DIAGNOSIS — F329 Major depressive disorder, single episode, unspecified: Secondary | ICD-10-CM | POA: Diagnosis not present

## 2015-12-07 DIAGNOSIS — R921 Mammographic calcification found on diagnostic imaging of breast: Secondary | ICD-10-CM | POA: Diagnosis not present

## 2015-12-07 DIAGNOSIS — E785 Hyperlipidemia, unspecified: Secondary | ICD-10-CM | POA: Diagnosis not present

## 2015-12-07 DIAGNOSIS — Z6841 Body Mass Index (BMI) 40.0 and over, adult: Secondary | ICD-10-CM | POA: Diagnosis not present

## 2015-12-07 DIAGNOSIS — I10 Essential (primary) hypertension: Secondary | ICD-10-CM | POA: Diagnosis not present

## 2015-12-07 DIAGNOSIS — M533 Sacrococcygeal disorders, not elsewhere classified: Secondary | ICD-10-CM | POA: Insufficient documentation

## 2015-12-07 DIAGNOSIS — M7918 Myalgia, other site: Secondary | ICD-10-CM

## 2015-12-07 DIAGNOSIS — M47896 Other spondylosis, lumbar region: Secondary | ICD-10-CM | POA: Diagnosis not present

## 2015-12-07 HISTORY — DX: Myalgia, other site: M79.18

## 2015-12-07 MED ORDER — HYDROCODONE-ACETAMINOPHEN 5-325 MG PO TABS: 1.0000 | ORAL_TABLET | Freq: Four times a day (QID) | ORAL | Status: DC | PRN

## 2015-12-07 MED ORDER — MAGNESIUM OXIDE 400 MG PO TABS: 400.0000 mg | ORAL_TABLET | Freq: Every day | ORAL | Status: DC

## 2015-12-07 MED ORDER — CYCLOBENZAPRINE HCL 10 MG PO TABS: 10.0000 mg | ORAL_TABLET | Freq: Two times a day (BID) | ORAL | Status: DC | PRN

## 2015-12-07 MED ORDER — GABAPENTIN 100 MG PO CAPS: 100.0000 mg | ORAL_CAPSULE | Freq: Three times a day (TID) | ORAL | Status: DC

## 2015-12-07 NOTE — Progress Notes (Signed)
Safety precautions to be maintained throughout the outpatient stay will include: orient to surroundings, keep bed in low position, maintain call bell within reach at all times, provide assistance with transfer out of bed and ambulation. Hydrocodone pill count # 0/120  Filled 10-29-15

## 2015-12-07 NOTE — Telephone Encounter (Signed)
Gave pt an appt for med refill. Made pt aware that Lelon Huh will give her a call making her aware of her x-ray appt along with her MRI. i routed this message to [facility] and left the printed order for her. Thanks

## 2015-12-07 NOTE — Progress Notes (Signed)
Patient's Name: Wanda [name]  Patient type: Established  MRN: [id number]  Service setting: Ambulatory outpatient  DOB: [birth year]  Location: [facility]  DOS: 12/07/2015  Primary Care Physician: Kathlene November, MD  Note by: Kathlen Brunswick. Dossie Arbour, M.D, DABA, DABAPM, DABPM, DABIPP, FIPP  Referring Physician: Colon Branch, MD  Specialty: Board-Certified Interventional Pain Management  Last Visit to Pain Management: 08/18/2015   Primary Reason(s) for Visit: Encounter for prescription drug management (Level of risk: moderate) CC: Back Pain   HPI  Wanda [name] is a 70 y.o. year old, female patient, who returns today as an established patient. She has Vitamin D deficiency; Dyslipidemia; Depression; Essential hypertension; ALLERGIC RHINITIS; CLIMACTERIC STATE, FEMALE; Annual physical exam; Leg cramps; Chronic pain; Chronic pain syndrome; Chronic low back pain (R>L); Lumbar spondylosis;  chronic lumbar radicular pain (Left) (S1 dermatome); Lumbar facet syndrome (Bilateral) (R>L); Long term current use of opiate analgesic; Long term prescription opiate use; Opiate use (20 MME/Day); Morbid obesity ([place]); Myofascial pain; Fibromyalgia; Chronic arthralgias of knees and hips; Osteoarthrosis; Grade 1 (71mm) Anterolisthesis of L3 over L4.; 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 Her primarily concern today is the Back Pain   Pain Assessment: Self-Reported Pain Score: 6  Reported level is compatible with observation Pain Type: Chronic pain Pain Location: Back Pain Orientation: Lower Pain Descriptors / Indicators:  Aching Pain Frequency: Constant  The patient comes into the clinics today for pharmacological management of her chronic pain. I last saw this patient on 08/18/2015. Her body mass index is 42.52 kg/(m^2). The patient  reports that she does not use illicit drugs. She still hasn't had the x-rays of the knees that I order on 05/20/2015 or the lumbar MRI that I ordered on 08/18/2015. Now she tells me that she is too big to get into the regular MRI and she wants the order changed to an open MRI.  Date of Last Visit: 08/18/15 Service Provided on Last Visit: Med Refill  Controlled Substance Pharmacotherapy Assessment & REMS (Risk Evaluation and Mitigation Strategy)  Analgesic: Hydrocodone/APAP 5/325 one every 6 hours (20 mg/day of hydrocodone) Pill Count: Hydrocodone pill count # 0/120 Filled 10-29-15. MME/day: 20 mg/day Pharmacokinetics: Onset of action (Liberation/Absorption): Within expected pharmacological parameters Time to Peak effect (Distribution): Timing and results are as within normal expected parameters Duration of action (Metabolism/Excretion): Within normal limits for medication Pharmacodynamics: Analgesic Effect: More than 50% Activity Facilitation: Medication(s) allow patient to sit, stand, walk, and do the basic ADLs Perceived Effectiveness: Described as relatively effective, allowing for increase in activities of daily living (ADL) Side-effects or Adverse reactions: None reported Monitoring: [place] PMP: Online review of the past 65-month period conducted. Compliant with practice rules and regulations UDS Results/interpretation: The patient's last UDS was done on 08/18/2015 and it came back within normal limits with no unexpected results. Medication Assessment Form: Reviewed. Patient indicates being compliant with therapy Treatment compliance: Compliant Risk Assessment: Aberrant Behavior: None observed today Substance Use Disorder (SUD) Risk Level: No change since last visit Risk of  opioid abuse or dependence: 0.7-3.0% with doses ? 36 MME/day and 6.1-26% with doses ? 120 MME/day. Opioid Risk Tool (ORT) Score: Total Score: 1 Low Risk for SUD (Score <3)  Depression Scale Score: PHQ-2: PHQ-2 Total Score: 0 No depression (0) PHQ-9: PHQ-9 Total Score: 0 No depression (0-4)  Pharmacologic Plan: No change in therapy, at this time  Laboratory Chemistry  Inflammation Markers Lab Results  Component Value Date   ESRSEDRATE 28 08/18/2015   CRP <0.5 08/18/2015    Renal Function Lab Results  Component Value Date   BUN 17 08/18/2015   CREATININE 0.58 08/18/2015   GFRAA >60 08/18/2015   GFRNONAA >60 08/18/2015    Hepatic Function Lab Results  Component Value Date   AST 22 08/18/2015   ALT 25 08/18/2015   ALBUMIN 4.0 08/18/2015    Electrolytes Lab Results  Component Value Date   NA 139 08/18/2015   K 4.1 08/18/2015   CL 104 08/18/2015   CALCIUM 9.7 08/18/2015   MG 2.0 08/18/2015    Pain Modulating Vitamins Lab Results  Component Value Date   VD25OH 37 05/01/2011   VD125OH2TOT 58 02/01/2015   [id number] 58 02/01/2015   [id number] <8 02/01/2015    Coagulation Parameters No results found for: INR, LABPROT  Note: I personally reviewed the above data. Results made available to patient.  Recent Diagnostic Imaging  Mm Digital Diagnostic Unilat L  12/06/2015  CLINICAL DATA:  Patient returns today to evaluate left breast calcifications identified on a recent screening mammogram. EXAM: DIGITAL DIAGNOSTIC LEFT MAMMOGRAM WITH CAD COMPARISON:  Previous exams including recent screening mammogram dated 11/22/2015. ACR Breast Density Category c: The breast tissue is heterogeneously dense, which may obscure small masses. FINDINGS: On today's additional views of the left breast with magnification, 2 adjacent groups of predominantly coarse calcifications are confirmed within the upper-outer quadrant at middle depth. Mammographic images were processed with CAD. IMPRESSION:  Two adjacent groups of predominantly coarse calcifications within the upper-outer quadrant of the left breast. These are probably benign calcifications, most likely fibroadenomatous, and a six-month follow-up left breast diagnostic mammogram is recommended to ensure stability. RECOMMENDATION: Follow-up left breast diagnostic mammogram, with magnification views, in 6 months. I have discussed the findings and recommendations with the patient. Results were also provided in writing at the conclusion of the visit. If applicable, a reminder letter will be sent to the patient regarding the next appointment. BI-RADS CATEGORY  3: Probably benign finding(s) - short interval follow-up suggested. Electronically Signed   By: Franki Cabot M.D.   On: 12/06/2015 11:32    Meds  The patient has a current medication list which includes the following prescription(s): aspirin ec, atorvastatin, vitamin d, cyclobenzaprine, gabapentin, hydrochlorothiazide, hydrocodone-acetaminophen, hydrocodone-acetaminophen, hydrocodone-acetaminophen, lisinopril, magnesium oxide, and sertraline.  Current Outpatient Prescriptions on File Prior to Visit  Medication Sig  . aspirin EC 81 MG tablet Take 81 mg by mouth daily.  [name] atorvastatin (LIPITOR) 10 MG tablet Take 1 tablet (10 mg total) by mouth at bedtime.  . Cholecalciferol (VITAMIN D) 2000 UNITS tablet Take 2,000 Units by mouth daily.  . hydrochlorothiazide (HYDRODIURIL) 25 MG tablet Take 1 tablet (25 mg total) by mouth daily.  [name] lisinopril (PRINIVIL,ZESTRIL) 10 MG tablet Take 1 tablet (10 mg total) by mouth daily.  . sertraline (ZOLOFT) 50 MG tablet Take 1.5 tablets (75 mg total) by mouth daily.   No current facility-administered medications on file prior to visit.    ROS  Constitutional: Denies any fever or chills Gastrointestinal: No reported hemesis, hematochezia, vomiting, or acute GI distress Musculoskeletal: Denies any acute onset joint swelling, redness, loss of ROM, or  weakness Neurological: No reported episodes of acute onset apraxia, aphasia,  dysarthria, agnosia, amnesia, paralysis, loss of coordination, or loss of consciousness  Allergies  Wanda [name] is allergic to simvastatin.  [place]  Medical:  Wanda [name]  has a past medical history of Allergy; Hyperlipidemia; Hypertension; DJD (degenerative joint disease); Depression; Menopause; Chronic back pain; and TMJ PAIN (05/17/2010). Family: family history includes Breast cancer in her sister; Cancer in her brother and sister; Colon cancer in her brother. There is no history of Coronary artery disease or CAD. Surgical:  has past surgical history that includes G3 P3 . Tobacco:  reports that she has never smoked. She has never used smokeless tobacco. Alcohol:  reports that she does not drink alcohol. Drug:  reports that she does not use illicit drugs.  Constitutional Exam  Vitals: Blood pressure 133/70, pulse 65, temperature 98.2 F (36.8 C), resp. rate 16, height 5\' 3"  (1.6 m), weight 240 lb (108.863 kg), SpO2 100 %. General appearance: Well nourished, well developed, and well hydrated. In no acute distress Calculated BMI/Body habitus: Body mass index is 42.52 kg/(m^2). (>40 kg/m2) Extreme obesity (Class III) - 254% higher incidence of chronic pain Psych/Mental status: Alert and oriented x 3 (person, place, & time) Eyes: PERLA Respiratory: No evidence of acute respiratory distress  Cervical Spine Exam  Inspection: No masses, redness, or swelling Alignment: Symmetrical ROM: Functional: Adequate ROM Active: Unrestricted ROM Stability: No instability detected Muscle strength & Tone: Functionally intact Sensory: Unimpaired Palpation: No complaints of tenderness  Upper Extremity (UE) Exam    Side: Right upper extremity  Side: Left upper extremity  Inspection: No masses, redness, swelling, or asymmetry  Inspection: No masses, redness, swelling, or asymmetry  ROM:  ROM:  Functional: Adequate ROM   Functional: Adequate ROM  Active: Unrestricted ROM  Active: Unrestricted ROM  Muscle strength & Tone: Functionally intact  Muscle strength & Tone: Functionally intact  Sensory: Unimpaired  Sensory: Unimpaired  Palpation: Non-contributory  Palpation: Non-contributory   Thoracic Spine Exam  Inspection: No masses, redness, or swelling Alignment: Symmetrical ROM: Functional: Adequate ROM Active: Unrestricted ROM Stability: No instability detected Sensory: Unimpaired Muscle strength & Tone: Functionally intact Palpation: No complaints of tenderness  Lumbar Spine Exam  Inspection: No masses, redness, or swelling Alignment: Symmetrical ROM: Functional: Limited ROM Active: Restricted ROM Stability: No instability detected Muscle strength & Tone: Functionally intact Sensory: Unimpaired Palpation: No complaints of tenderness Provocative Tests: Lumbar Hyperextension and rotation test: deferred Patrick's Maneuver: deferred  Gait & Posture Assessment  Gait: Patient ambulates using a cane Posture: WNL  Lower Extremity Exam    Side: Right lower extremity  Side: Left lower extremity  Inspection: No masses, redness, swelling, or asymmetry ROM:  Inspection: No masses, redness, swelling, or asymmetry ROM:  Functional: Adequate ROM  Functional: Adequate ROM  Active: Unrestricted ROM  Active: Unrestricted ROM  Muscle strength & Tone: Functionally intact  Muscle strength & Tone: Functionally intact  Sensory: Unimpaired  Sensory: Unimpaired  Palpation: Non-contributory  Palpation: Non-contributory   Assessment & Plan  Primary Diagnosis & Pertinent Problem List: The primary encounter diagnosis was Chronic pain. Diagnoses of Encounter for therapeutic drug level monitoring, Long term current use of opiate analgesic, Fibromyalgia, Neurogenic pain, and Musculoskeletal pain were also pertinent to this visit.  Visit Diagnosis: 1. Chronic pain   2. Encounter for therapeutic drug level monitoring    3. Long term current use of opiate analgesic   4. Fibromyalgia   5. Neurogenic pain   6. Musculoskeletal pain     Problems updated and reviewed during this visit:  Problem  Neurogenic Pain  Musculoskeletal Pain  Leg Cramps  Opiate use (20 MME/Day)  Vitamin D Deficiency   Qualifier: Diagnosis of  By: Larose Kells MD, [place]      Problem-specific Plan(s): No problem-specific assessment & plan notes found for this encounter.  No new assessment & plan notes have been filed under this hospital service since the last note was generated. Service: Pain Management   Plan of Care   Problem List Items Addressed This Visit      High   Chronic pain - Primary (Chronic)   Relevant Medications   HYDROcodone-acetaminophen (NORCO/VICODIN) 5-325 MG tablet   HYDROcodone-acetaminophen (NORCO/VICODIN) 5-325 MG tablet   HYDROcodone-acetaminophen (NORCO/VICODIN) 5-325 MG tablet   cyclobenzaprine (FLEXERIL) 10 MG tablet   gabapentin (NEURONTIN) 100 MG capsule   Fibromyalgia (Chronic)   Musculoskeletal pain (Chronic)   Relevant Medications   cyclobenzaprine (FLEXERIL) 10 MG tablet   magnesium oxide (MAG-OX) 400 MG tablet   Neurogenic pain (Chronic)   Relevant Medications   gabapentin (NEURONTIN) 100 MG capsule     Medium   Encounter for therapeutic drug level monitoring   Long term current use of opiate analgesic (Chronic)   Relevant Orders   ToxASSURE Select 13 (MW), Urine       Pharmacotherapy (Medications Ordered): Meds ordered this encounter  Medications  . HYDROcodone-acetaminophen (NORCO/VICODIN) 5-325 MG tablet    Sig: Take 1 tablet by mouth every 6 (six) hours as needed for severe pain.    Dispense:  120 tablet    Refill:  0    Do not place this medication, or any other prescription from our practice, on "Automatic Refill". Patient may have prescription filled one day early if pharmacy is closed on scheduled refill date. Do not fill until: 12/07/15 To last until: 01/06/16  .  HYDROcodone-acetaminophen (NORCO/VICODIN) 5-325 MG tablet    Sig: Take 1 tablet by mouth every 6 (six) hours as needed for severe pain.    Dispense:  120 tablet    Refill:  0    Do not place this medication, or any other prescription from our practice, on "Automatic Refill". Patient may have prescription filled one day early if pharmacy is closed on scheduled refill date. Do not fill until: 01/06/16 To last until: 02/05/16  . HYDROcodone-acetaminophen (NORCO/VICODIN) 5-325 MG tablet    Sig: Take 1 tablet by mouth every 6 (six) hours as needed for severe pain.    Dispense:  120 tablet    Refill:  0    Do not place this medication, or any other prescription from our practice, on "Automatic Refill". Patient may have prescription filled one day early if pharmacy is closed on scheduled refill date. Do not fill until: 02/05/16 To last until: 03/06/16  . cyclobenzaprine (FLEXERIL) 10 MG tablet    Sig: Take 1 tablet (10 mg total) by mouth 2 (two) times daily as needed for muscle spasms.    Dispense:  60 tablet    Refill:  2    Do not place this medication, or any other prescription from our practice, on "Automatic Refill". Patient may have prescription filled one day early if pharmacy is closed on scheduled refill date.  . gabapentin (NEURONTIN) 100 MG capsule    Sig: Take 1 capsule (100 mg total) by mouth 3 (three) times daily.    Dispense:  90 capsule    Refill:  2    Do not place this medication, or any other prescription from our practice, on "Automatic  Refill". Patient may have prescription filled one day early if pharmacy is closed on scheduled refill date.  . magnesium oxide (MAG-OX) 400 MG tablet    Sig: Take 1 tablet (400 mg total) by mouth daily.    Dispense:  30 tablet    Refill:  2    Do not place this medication, or any other prescription from our practice, on "Automatic Refill". Patient may have prescription filled one day early if pharmacy is closed on scheduled refill date.     Lab-work & Procedure Ordered: Orders Placed This Encounter  Procedures  . ToxASSURE Select 13 (MW), Urine    Imaging Ordered: None  Interventional Therapies: Scheduled:  None at this time.    Considering:  She has a standing order for a when necessary lumbar facet block as well is a standing order for when necessary lumbar epidural steroid injection.    PRN Procedures:   1. Diagnostic bilateral lumbar facet block under fluoroscopic guidance and IV sedation for the low back pain.  2. Left L4-5 lumbar epidural steroid injection under fluoroscopic guidance, no sedation.    Referral(s) or Consult(s): None at this time.  New Prescriptions   No medications on file    Medications administered during this visit: Wanda [name] had no medications administered during this visit.  Requested PM Follow-up: Return in about 3 months (around 02/23/2016) for Medication Management, (3-Mo), Procedure (PRN - Patient will call).  Future Appointments Date Time Provider [place]  03/08/2016 9:00 AM Milinda Pointer, MD [facility] None    Primary Care Physician: Kathlene November, MD Location: [facility] Note by: Kathlen Brunswick. Dossie Arbour, M.D, DABA, DABAPM, DABPM, DABIPP, FIPP  Pain Score Disclaimer: We use the NRS-11 scale. This is a self-reported, subjective measurement of pain severity with only modest accuracy. It is used primarily to identify changes within a particular patient. It must be understood that outpatient pain scales are significantly less accurate that those used for research, where they can be applied under ideal controlled circumstances with minimal exposure to variables. In reality, the score is likely to be a combination of pain intensity and pain affect, where pain affect describes the degree of emotional arousal or changes in action readiness caused by the sensory experience of pain. Factors such as social and work situation, setting, emotional state,  anxiety levels, expectation, and prior pain experience may influence pain perception and show large inter-individual differences that may also be affected by time variables.  Patient instructions provided during this appointment: There are no Patient Instructions on file for this visit.

## 2015-12-14 LAB — TOXASSURE SELECT 13 (MW), URINE

## 2015-12-22 NOTE — Telephone Encounter (Signed)
Since I was out when the patient came in I wanted to speak with her about getting approval for MRI and ask her when is a good time for considering she has canceled MRI appointments in the past

## 2016-01-18 ENCOUNTER — Telehealth: Payer: Self-pay | Admitting: *Deleted

## 2016-01-18 ENCOUNTER — Ambulatory Visit: Payer: Commercial Managed Care - HMO | Admitting: Internal Medicine

## 2016-01-18 NOTE — Telephone Encounter (Signed)
[facility] called stating that they need a provider change on the authorization for her MRI. They need it changed to [facility] the number is [phone] Baker Janus is her name.Marland KitchenMarland KitchenTD

## 2016-02-20 ENCOUNTER — Ambulatory Visit
Admission: RE | Admit: 2016-02-20 | Discharge: 2016-02-20 | Disposition: A | Discharge status: Home / Self Care | Payer: Commercial Managed Care - HMO | Source: Ambulatory Visit | Attending: Pain Medicine | Admitting: Pain Medicine

## 2016-02-20 DIAGNOSIS — M5410 Radiculopathy, site unspecified: Principal | ICD-10-CM

## 2016-02-20 DIAGNOSIS — M5126 Other intervertebral disc displacement, lumbar region: Secondary | ICD-10-CM | POA: Diagnosis not present

## 2016-02-20 DIAGNOSIS — G8929 Other chronic pain: Secondary | ICD-10-CM

## 2016-02-28 NOTE — Progress Notes (Signed)
Results were reviewed and found to be: abnormal    Review would suggest interventional pain management techniques may be of benefit  Surgical consultation may be of benefit

## 2016-03-08 ENCOUNTER — Encounter: Payer: Commercial Managed Care - HMO | Admitting: Pain Medicine

## 2016-03-20 NOTE — Progress Notes (Signed)
Patient's Name: Wanda [name]  Patient type: Established  MRN: [id number]  Service setting: Ambulatory outpatient  DOB: 09-Jun-1946  Location: [facility]  DOS: 03/21/2016  Primary Care Physician: Kathlene November, MD  Note by: Kathlen Brunswick. Dossie Arbour, M.D  Referring Physician: Colon Branch, MD  Specialty: Interventional Pain Management  Last Visit to Pain Management: 01/18/2016   Primary Reason(s) for Visit: Encounter for prescription drug management (Level of risk: moderate) CC: Back Pain (lower, middle)   HPI  Wanda [name] is a 70 y.o. year old, female patient, who returns today as an established patient. She has Vitamin D deficiency; Dyslipidemia; Depression; Essential hypertension; ALLERGIC RHINITIS; CLIMACTERIC STATE, FEMALE; Annual physical exam; Leg cramps; Chronic pain; Chronic pain syndrome; Chronic low back pain (R>L); Lumbar spondylosis;  chronic lumbar radicular pain (Left) (S1 dermatome); Lumbar facet syndrome (Bilateral) (R>L); Long term current use of opiate analgesic; Long term prescription opiate use; Opiate use (20 MME/Day); Myofascial pain; Fibromyalgia; Chronic knee pain (Bilateral); Osteoarthrosis; Grade 1 (26mm) Anterolisthesis of L3 over L4.; Lumbar intervertebral disc bulge (L2-3, L3-4, L4-5, and L6-S1); 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 Her primarily concern today is the Back Pain (lower, middle)   Pain Assessment: Self-Reported Pain Score: 6  Clinically the patient looks like a  3/10 Reported level is inconsistent with clinical obrservations Information on the proper use of the pain score provided to the patient today. Pain Type: Chronic pain Pain Location: Back Pain Orientation: Lower Pain Descriptors / Indicators: Aching Pain Frequency: Constant  The patient comes into the clinics today for pharmacological management of her chronic pain. I last saw this patient on 12/07/2015. The patient  reports that she does not use drugs. Her body mass index is 42.51 kg/m.  Date of Last Visit: 12/07/15 Service Provided on Last Visit: Med Refill  Controlled Substance Pharmacotherapy Assessment & REMS (Risk Evaluation and Mitigation Strategy)  Analgesic: Hydrocodone/APAP 5/325 one every 6 hours (20 mg/day of hydrocodone) MME/day: 20 mg/day Pill Count: Hydrocodone 5/325mg  #26 out of 120  Remaining. Filled 02-28-16. Pharmacokinetics: Onset of action (Liberation/Absorption): Within expected pharmacological parameters Time to Peak effect (Distribution): Timing and results are as within normal expected parameters Duration of action (Metabolism/Excretion): Within normal limits for medication Pharmacodynamics: Analgesic Effect: More than 50% Activity Facilitation: Medication(s) allow patient to sit, stand, walk, and do the basic ADLs Perceived Effectiveness: Described as relatively effective, allowing for increase in activities of daily living (ADL) Side-effects or Adverse reactions: None reported Monitoring: [place] PMP: Online review of the past 44-month period conducted. Compliant with practice rules and regulations Last UDS on record: ToxAssure Select 13  Date Value Ref Range Status  12/07/2015 FINAL  Final    Comment:    ==================================================================== TOXASSURE SELECT 13 (MW) ==================================================================== Test                             Result       Flag       Units Drug Present and Declared for  Prescription Verification   Hydrocodone  331          EXPECTED   ng/mg creat   Hydromorphone                  92           EXPECTED   ng/mg creat   Dihydrocodeine                 81           EXPECTED   ng/mg creat   Norhydrocodone                 600          EXPECTED   ng/mg creat    Sources of hydrocodone include scheduled prescription    medications. Hydromorphone, dihydrocodeine and norhydrocodone are    expected metabolites of hydrocodone. Hydromorphone and    dihydrocodeine are also available as scheduled prescription    medications. ==================================================================== Test                      Result    Flag   Units      Ref Range   Creatinine              216              mg/dL      >=20 ==================================================================== Declared Medications:  The flagging and interpretation on this report are based on the  following declared medications.  Unexpected results may arise from  inaccuracies in the declared medications.  **Note: The testing scope of this panel includes these medications:  Hydrocodone (Norco)  **Note: The testing scope of this panel does not include following  reported medications:  Acetaminophen (Norco)  Acyclovir (Zovirax)  Aspirin (Aspirin 81)  Atorvastatin (Lipitor)  Cyclobenzaprine (Flexeril)  Gabapentin (Neurontin)  Hydrochlorothiazide (Hydrodiuril)  Lisinopril (Prinivil)  Magnesium (Mag-Ox)  Sertraline (Zoloft)  Vitamin D ==================================================================== For clinical consultation, please call [phone]. ====================================================================    UDS interpretation: Compliant          Medication Assessment Form: Reviewed. Patient indicates being compliant with therapy Treatment compliance: Compliant Risk Assessment: Aberrant Behavior: None observed today Substance Use Disorder (SUD) Risk Level:  No change since last visit Risk of opioid abuse or dependence: 0.7-3.0% with doses ? 36 MME/day and 6.1-26% with doses ? 120 MME/day. Opioid Risk Tool (ORT) Score: Total Score: 0    Depression Scale Score: PHQ-2: PHQ-2 Total Score: 0       PHQ-9: PHQ-9 Total Score: 0        Pharmacologic Plan: No change in therapy, at this time  Laboratory Chemistry  Inflammation Markers Lab Results  Component Value Date   ESRSEDRATE 28 08/18/2015   CRP <0.5 08/18/2015    Renal Function Lab Results  Component Value Date   BUN 17 08/18/2015   CREATININE 0.58 08/18/2015   GFRAA >60 08/18/2015   GFRNONAA >60 08/18/2015    Hepatic Function Lab Results  Component Value Date   AST 22 08/18/2015   ALT 25 08/18/2015   ALBUMIN 4.0 08/18/2015    Electrolytes Lab Results  Component Value Date   NA 139 08/18/2015   K 4.1 08/18/2015   CL 104 08/18/2015   CALCIUM 9.7 08/18/2015   MG 2.0 08/18/2015    Pain Modulating Vitamins Lab Results  Component Value Date   VD25OH 37 05/01/2011   VD125OH2TOT 58 02/01/2015   [id number] 58 02/01/2015   [id number] <8 02/01/2015  Coagulation Parameters Lab Results  Component Value Date   PLT 188.0 02/01/2015    Cardiovascular Lab Results  Component Value Date   HGB 13.8 02/01/2015   HCT 41.3 02/01/2015    Note: Lab results reviewed.  Recent Diagnostic Imaging  Mr Lumbar Spine Wo Contrast  Result Date: 02/20/2016 CLINICAL DATA:  Chronic low back pain. Left gluteal pain and lower leg numbness for 20 years. Evaluation prior to spinal injection. EXAM: MRI LUMBAR SPINE WITHOUT CONTRAST TECHNIQUE: Multiplanar, multisequence MR imaging of the lumbar spine was performed. No intravenous contrast was administered. COMPARISON:  Single sagittal T1 acquisition 08/30/2015. FINDINGS: Segmentation: Nonstandard. Based on the lowest ribs on radiography 11/15/2012 there are 6 lumbar type vertebral bodies, the lowest numbered S1. This gives an open disc space at  S1-S2. Ribs are not clearly visible on this exam. Alignment: L4-5 and L5-S1 grade 1 anterolisthesis from facet arthropathy. Vertebrae: No fracture, evidence of discitis, or bone lesion. A benign L2 hemangioma is noted. Conus medullaris: Extends to the L1 level and appears normal. Paraspinal and other soft tissues: At least 24 mm cyst in the left pelvis, likely ovarian. Given partial visualization sonographic workup is recommended in this postmenopausal patient. Disc levels: T12- L1: Ventral spondylotic spurring. Right foraminal to paracentral annular fissure and disc protrusion, only seen on sagittal acquisition, with foraminal fat effacement. L1-L2: Right foraminal disc protrusion with mild impingement, chronic. Ventral spondylosis. Negative facets. L2-L3: Small left foraminal protrusion which contacts the L2 nerve, chronic. Facet hypertrophy with asymmetric degenerative spurring on the right. L3-L4: Disc bulging with superimposed broad right paracentral to foraminal disc protrusion most convincing on sagittal acquisition. Facet hypertrophy with advanced spurring. No suspected impingement. L4-L5: Bulging of the uncovered disc greatest in the right foraminal region. Advanced facet arthropathy with spurring and anterolisthesis. No suspected impingement. L5-S1:Bulging of the uncovered disc. Advanced facet arthropathy with bulky spurring and anterolisthesis. No suspected impingement. S1-S2: Disc narrowing with bulky left far-lateral spurring that does not convincingly compress the S1 nerve. Mild degenerative facet spurring. No suspected impingement. IMPRESSION: 1. 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 The lowest disc level is numbered S1-S2. Numbering scheme differs from 11/25/2012 MRI. 2. Severe facet arthropathy from L3-4 to L5-S1 with grade 1 anterolisthesis at L4-5 and L5-S1. 3. Chronic T12-L1 right foraminal protrusion with T12 impingement. 4. Chronic L1-2 right  foraminal protrusion with mild impingement. 5. Chronic L2-3 left foraminal protrusion with mild impingement. 6. 24 mm left pelvic cyst, likely ovarian. Given partial visualization nonemergent sonography is recommended. Electronically Signed   By: Monte Fantasia M.D.   On: 02/20/2016 18:38   Meds  The patient has a current medication list which includes the following prescription(s): aspirin ec, atorvastatin, vitamin d, hydrochlorothiazide, lisinopril, sertraline, cyclobenzaprine, gabapentin, hydrocodone-acetaminophen, hydrocodone-acetaminophen, hydrocodone-acetaminophen, and magnesium oxide.  Current Outpatient Prescriptions on File Prior to Visit  Medication Sig  . aspirin EC 81 MG tablet Take 81 mg by mouth daily.  [name] atorvastatin (LIPITOR) 10 MG tablet Take 1 tablet (10 mg total) by mouth at bedtime.  . Cholecalciferol (VITAMIN D) 2000 UNITS tablet Take 2,000 Units by mouth daily.  . hydrochlorothiazide (HYDRODIURIL) 25 MG tablet Take 1 tablet (25 mg total) by mouth daily.  [name] lisinopril (PRINIVIL,ZESTRIL) 10 MG tablet Take 1 tablet (10 mg total) by mouth daily.  . sertraline (ZOLOFT) 50 MG tablet Take 1.5 tablets (75 mg total) by mouth daily.   No current facility-administered medications on file prior to visit.  ROS  Constitutional: Denies any fever or chills Gastrointestinal: No reported hemesis, hematochezia, vomiting, or acute GI distress Musculoskeletal: Denies any acute onset joint swelling, redness, loss of ROM, or weakness Neurological: No reported episodes of acute onset apraxia, aphasia, dysarthria, agnosia, amnesia, paralysis, loss of coordination, or loss of consciousness  Allergies  Ms. Sumners is allergic to simvastatin.  [place]  Medical:  Ms. Devane  has a past medical history of Allergy; Chronic back pain; Depression; DJD (degenerative joint disease); Hyperlipidemia; Hypertension; Menopause; and TMJ PAIN (05/17/2010). Family: family history includes Breast cancer in  her sister; Cancer in her brother and sister; Colon cancer in her brother. Surgical:  has a past surgical history that includes G3 P3 . Tobacco:  reports that she has never smoked. She has never used smokeless tobacco. Alcohol:  reports that she does not drink alcohol. Drug:  reports that she does not use drugs.  Constitutional Exam  Vitals: Blood pressure 131/80, pulse 68, temperature 98 F (36.7 C), temperature source Oral, resp. rate 16, height 5\' 3"  (1.6 m), weight 240 lb (108.9 kg), SpO2 97 %. General appearance: Well nourished, well developed, and well hydrated. In no acute distress Calculated BMI/Body habitus: Body mass index is 42.51 kg/m. (>40 kg/m2) Extreme obesity (Class III) - 254% higher incidence of chronic pain Psych/Mental status: Alert and oriented x 3 (person, place, & time) Eyes: PERLA Respiratory: No evidence of acute respiratory distress  Cervical Spine Exam  Inspection: No masses, redness, or swelling Alignment: Symmetrical Functional ROM: ROM appears unrestricted Stability: No instability detected Muscle strength & Tone: Functionally intact Sensory: Unimpaired Palpation: Non-contributory  Upper Extremity (UE) Exam    Side: Right upper extremity  Side: Left upper extremity  Inspection: No masses, redness, swelling, or asymmetry  Inspection: No masses, redness, swelling, or asymmetry  Functional ROM: ROM appears unrestricted  Functional ROM: ROM appears unrestricted  Muscle strength & Tone: Functionally intact  Muscle strength & Tone: Functionally intact  Sensory: Unimpaired  Sensory: Unimpaired  Palpation: Non-contributory  Palpation: Non-contributory   Thoracic Spine Exam  Inspection: No masses, redness, or swelling Alignment: Symmetrical Functional ROM: ROM appears unrestricted Stability: No instability detected Sensory: Unimpaired Muscle strength & Tone: Functionally intact Palpation: Non-contributory  Lumbar Spine Exam  Inspection: No masses,  redness, or swelling Alignment: Symmetrical Functional ROM: Decreased ROM Stability: No instability detected Muscle strength & Tone: Functionally intact Sensory: Movement-associated pain Palpation: Complains of area being tender to palpation Provocative Tests: Lumbar Hyperextension and rotation test: Positive bilaterally for facet joint pain. Patrick's Maneuver: evaluation deferred today              Gait & Posture Assessment  Ambulation: Unassisted Gait: Relatively normal for age and body habitus Posture: WNL   Lower Extremity Exam    Side: Right lower extremity  Side: Left lower extremity  Inspection: No masses, redness, swelling, or asymmetry  Inspection: No masses, redness, swelling, or asymmetry  Functional ROM: ROM appears unrestricted  Functional ROM: ROM appears unrestricted  Muscle strength & Tone: Functionally intact  Muscle strength & Tone: Functionally intact  Sensory: Unimpaired  Sensory: Unimpaired  Palpation: Non-contributory  Palpation: Non-contributory    Assessment & Plan  Primary Diagnosis & Pertinent Problem List: The primary encounter diagnosis was Long term current use of opiate analgesic. Diagnoses of Chronic pain, Musculoskeletal pain, Neurogenic pain, Opiate use (20 MME/Day), Primary osteoarthritis of both knees, Primary osteoarthritis of both hips, Chronic hip pain, unspecified laterality, Chronic arthralgias of knees and hips, unspecified laterality, Morbid obesity  with body mass index (BMI) of 40.0 to 44.9 in adult [facility]), and Primary osteoarthritis involving multiple joints were also pertinent to this visit.  Visit Diagnosis: 1. Long term current use of opiate analgesic   2. Chronic pain   3. Musculoskeletal pain   4. Neurogenic pain   5. Opiate use (20 MME/Day)   6. Primary osteoarthritis of both knees   7. Primary osteoarthritis of both hips   8. Chronic hip pain, unspecified laterality   9. Chronic arthralgias of knees and hips, unspecified  laterality   10. Morbid obesity with body mass index (BMI) of 40.0 to 44.9 in adult ([place])   11. Primary osteoarthritis involving multiple joints     Problems updated and reviewed during this visit: Problem  Osteoarthritis of knees (Bilateral)  Osteoarthritis of hips (Bilateral)  Chronic hip pain (Bilateral)  Chronic knee pain (Bilateral)  Morbid Obesity With Body Mass Index (Bmi) of 40.0 to 44.9 in Adult ([facility])    Problem-specific Plan(s): No problem-specific Assessment & Plan notes found for this encounter.  No new Assessment & Plan notes have been filed under this hospital service since the last note was generated. Service: Pain Management   Plan of Care   Problem List Items Addressed This Visit      High   Chronic hip pain (Bilateral) (Chronic)   Relevant Medications   HYDROcodone-acetaminophen (NORCO/VICODIN) 5-325 MG tablet   HYDROcodone-acetaminophen (NORCO/VICODIN) 5-325 MG tablet   HYDROcodone-acetaminophen (NORCO/VICODIN) 5-325 MG tablet   cyclobenzaprine (FLEXERIL) 10 MG tablet   gabapentin (NEURONTIN) 100 MG capsule   Other Relevant Orders   DG HIP UNILAT W OR W/O PELVIS 2-3 VIEWS LEFT   DG HIP UNILAT W OR W/O PELVIS 2-3 VIEWS RIGHT   HIP INJECTION   Chronic pain (Chronic)   Relevant Medications   HYDROcodone-acetaminophen (NORCO/VICODIN) 5-325 MG tablet   HYDROcodone-acetaminophen (NORCO/VICODIN) 5-325 MG tablet   HYDROcodone-acetaminophen (NORCO/VICODIN) 5-325 MG tablet   cyclobenzaprine (FLEXERIL) 10 MG tablet   gabapentin (NEURONTIN) 100 MG capsule   Musculoskeletal pain (Chronic)   Relevant Medications   cyclobenzaprine (FLEXERIL) 10 MG tablet   magnesium oxide (MAG-OX) 400 MG tablet   Neurogenic pain (Chronic)   Relevant Medications   gabapentin (NEURONTIN) 100 MG capsule   Osteoarthritis of hips (Bilateral) (Chronic)   Relevant Medications   HYDROcodone-acetaminophen (NORCO/VICODIN) 5-325 MG tablet   HYDROcodone-acetaminophen (NORCO/VICODIN)  5-325 MG tablet   HYDROcodone-acetaminophen (NORCO/VICODIN) 5-325 MG tablet   cyclobenzaprine (FLEXERIL) 10 MG tablet   Other Relevant Orders   DG HIP UNILAT W OR W/O PELVIS 2-3 VIEWS LEFT   DG HIP UNILAT W OR W/O PELVIS 2-3 VIEWS RIGHT   HIP INJECTION   Osteoarthritis of knees (Bilateral) (Chronic)   Relevant Medications   HYDROcodone-acetaminophen (NORCO/VICODIN) 5-325 MG tablet   HYDROcodone-acetaminophen (NORCO/VICODIN) 5-325 MG tablet   HYDROcodone-acetaminophen (NORCO/VICODIN) 5-325 MG tablet   cyclobenzaprine (FLEXERIL) 10 MG tablet   Other Relevant Orders   KNEE INJECTION   Osteoarthrosis (Chronic)   Relevant Medications   HYDROcodone-acetaminophen (NORCO/VICODIN) 5-325 MG tablet   HYDROcodone-acetaminophen (NORCO/VICODIN) 5-325 MG tablet   HYDROcodone-acetaminophen (NORCO/VICODIN) 5-325 MG tablet   cyclobenzaprine (FLEXERIL) 10 MG tablet   Other Relevant Orders   DG HIP UNILAT W OR W/O PELVIS 2-3 VIEWS LEFT   DG HIP UNILAT W OR W/O PELVIS 2-3 VIEWS RIGHT   HIP INJECTION   KNEE INJECTION   Amb ref to Medical Nutrition Therapy-MNT   Ambulatory referral to General Surgery     Medium  Long term current use of opiate analgesic - Primary (Chronic)   Opiate use (20 MME/Day) (Chronic)     Low   Morbid obesity with body mass index (BMI) of 40.0 to 44.9 in adult [facility]) (Chronic)   Relevant Medications   magnesium oxide (MAG-OX) 400 MG tablet   Other Relevant Orders   Amb ref to Medical Nutrition Therapy-MNT   Ambulatory referral to General Surgery    Other Visit Diagnoses    Chronic arthralgias of knees and hips, unspecified laterality  (Chronic)      Relevant Orders   KNEE INJECTION       Pharmacotherapy (Medications Ordered): Meds ordered this encounter  Medications  . HYDROcodone-acetaminophen (NORCO/VICODIN) 5-325 MG tablet    Sig: Take 1 tablet by mouth every 6 (six) hours as needed for severe pain.    Dispense:  120 tablet    Refill:  0    Do not place this  medication, or any other prescription from our practice, on "Automatic Refill". Patient may have prescription filled one day early if pharmacy is closed on scheduled refill date. Do not fill until: 03/21/16 To last until: 04/20/16  . HYDROcodone-acetaminophen (NORCO/VICODIN) 5-325 MG tablet    Sig: Take 1 tablet by mouth every 6 (six) hours as needed for severe pain.    Dispense:  120 tablet    Refill:  0    Do not place this medication, or any other prescription from our practice, on "Automatic Refill". Patient may have prescription filled one day early if pharmacy is closed on scheduled refill date. Do not fill until: 04/20/16 To last until: 05/20/16  . HYDROcodone-acetaminophen (NORCO/VICODIN) 5-325 MG tablet    Sig: Take 1 tablet by mouth every 6 (six) hours as needed for severe pain.    Dispense:  120 tablet    Refill:  0    Do not place this medication, or any other prescription from our practice, on "Automatic Refill". Patient may have prescription filled one day early if pharmacy is closed on scheduled refill date. Do not fill until: 05/20/16 To last until: 06/19/16  . cyclobenzaprine (FLEXERIL) 10 MG tablet    Sig: Take 1 tablet (10 mg total) by mouth 2 (two) times daily as needed for muscle spasms.    Dispense:  60 tablet    Refill:  2    Do not place this medication, or any other prescription from our practice, on "Automatic Refill". Patient may have prescription filled one day early if pharmacy is closed on scheduled refill date.  . magnesium oxide (MAG-OX) 400 MG tablet    Sig: Take 1 tablet (400 mg total) by mouth daily.    Dispense:  30 tablet    Refill:  2    Do not place this medication, or any other prescription from our practice, on "Automatic Refill". Patient may have prescription filled one day early if pharmacy is closed on scheduled refill date.  . gabapentin (NEURONTIN) 100 MG capsule    Sig: Take 1 capsule (100 mg total) by mouth 3 (three) times daily.     Dispense:  90 capsule    Refill:  2    Do not place this medication, or any other prescription from our practice, on "Automatic Refill". Patient may have prescription filled one day early if pharmacy is closed on scheduled refill date.    Lab-work & Procedure Ordered: Orders Placed This Encounter  Procedures  . HIP INJECTION  . KNEE INJECTION  . DG HIP UNILAT  W OR W/O PELVIS 2-3 VIEWS LEFT  . DG HIP UNILAT W OR W/O PELVIS 2-3 VIEWS RIGHT  . Amb ref to Medical Nutrition Therapy-MNT  . Ambulatory referral to General Surgery    Imaging Ordered: DG KNEE COMPLETE 4 VIEWS LEFT DG KNEE COMPLETE 4 VIEWS RIGHT AMB REFERRAL TO MEDICAL NUTRITION THERAPY (MNT) AMB REFERRAL TO GENERAL SURGERY DG HIP UNILAT W OR W/O PELVIS 2-3 VIEWS LEFT DG HIP UNILAT W OR W/O PELVIS 2-3 VIEWS RIGHT  Interventional Therapies: Scheduled:  None at this time.    Considering:  She has a standing order for a when necessary lumbar facet block as well is a standing order for when necessary lumbar epidural steroid injection.    PRN Procedures:  Diagnostic bilateral lumbar facet block under fluoroscopic guidance and IV sedation for the low back pain. Left L4-5 lumbar epidural steroid injection under fluoroscopic guidance, no sedation.    Referral(s) or Consult(s): None at this time.  New Prescriptions   No medications on file    Medications administered during this visit: Ms. Rile had no medications administered during this visit.  Requested PM Follow-up: Return in 3 months (on 06/07/2016) for Med-Mgmt, In addition, Schedule Procedure, (PRN) Procedure.  Future Appointments Date Time Provider [place]  06/07/2016 11:00 AM Milinda Pointer, MD [facility] None    Primary Care Physician: Kathlene November, MD Location: [facility] Outpatient Pain Management Facility Note by: Kathlen Brunswick. Dossie Arbour, M.D, DABA, DABAPM, DABPM, DABIPP, FIPP  Pain Score Disclaimer: We use the NRS-11 scale. This is a  self-reported, subjective measurement of pain severity with only modest accuracy. It is used primarily to identify changes within a particular patient. It must be understood that outpatient pain scales are significantly less accurate that those used for research, where they can be applied under ideal controlled circumstances with minimal exposure to variables. In reality, the score is likely to be a combination of pain intensity and pain affect, where pain affect describes the degree of emotional arousal or changes in action readiness caused by the sensory experience of pain. Factors such as social and work situation, setting, emotional state, anxiety levels, expectation, and prior pain experience may influence pain perception and show large inter-individual differences that may also be affected by time variables.  Patient instructions provided during this appointment: There are no Patient Instructions on file for this visit.

## 2016-03-21 ENCOUNTER — Encounter: Payer: Self-pay | Admitting: Pain Medicine

## 2016-03-21 ENCOUNTER — Ambulatory Visit: Payer: Commercial Managed Care - HMO | Attending: Pain Medicine | Admitting: Pain Medicine

## 2016-03-21 VITALS — BP 131/80 | HR 68 | Temp 98.0°F | Resp 16 | Ht 63.0 in | Wt 240.0 lb

## 2016-03-21 DIAGNOSIS — M170 Bilateral primary osteoarthritis of knee: Secondary | ICD-10-CM | POA: Diagnosis not present

## 2016-03-21 DIAGNOSIS — M533 Sacrococcygeal disorders, not elsewhere classified: Secondary | ICD-10-CM | POA: Diagnosis not present

## 2016-03-21 DIAGNOSIS — F329 Major depressive disorder, single episode, unspecified: Secondary | ICD-10-CM | POA: Diagnosis not present

## 2016-03-21 DIAGNOSIS — D1809 Hemangioma of other sites: Secondary | ICD-10-CM | POA: Insufficient documentation

## 2016-03-21 DIAGNOSIS — M4316 Spondylolisthesis, lumbar region: Secondary | ICD-10-CM | POA: Diagnosis not present

## 2016-03-21 DIAGNOSIS — I10 Essential (primary) hypertension: Secondary | ICD-10-CM | POA: Insufficient documentation

## 2016-03-21 DIAGNOSIS — M797 Fibromyalgia: Secondary | ICD-10-CM | POA: Diagnosis not present

## 2016-03-21 DIAGNOSIS — M25569 Pain in unspecified knee: Secondary | ICD-10-CM

## 2016-03-21 DIAGNOSIS — M545 Low back pain: Secondary | ICD-10-CM | POA: Insufficient documentation

## 2016-03-21 DIAGNOSIS — M7918 Myalgia, other site: Secondary | ICD-10-CM

## 2016-03-21 DIAGNOSIS — M791 Myalgia: Secondary | ICD-10-CM

## 2016-03-21 DIAGNOSIS — J309 Allergic rhinitis, unspecified: Secondary | ICD-10-CM | POA: Insufficient documentation

## 2016-03-21 DIAGNOSIS — Z6841 Body Mass Index (BMI) 40.0 and over, adult: Secondary | ICD-10-CM | POA: Diagnosis not present

## 2016-03-21 DIAGNOSIS — M792 Neuralgia and neuritis, unspecified: Secondary | ICD-10-CM | POA: Diagnosis not present

## 2016-03-21 DIAGNOSIS — M25559 Pain in unspecified hip: Secondary | ICD-10-CM | POA: Insufficient documentation

## 2016-03-21 DIAGNOSIS — Z79891 Long term (current) use of opiate analgesic: Secondary | ICD-10-CM | POA: Diagnosis not present

## 2016-03-21 DIAGNOSIS — M5116 Intervertebral disc disorders with radiculopathy, lumbar region: Secondary | ICD-10-CM | POA: Diagnosis not present

## 2016-03-21 DIAGNOSIS — Z7982 Long term (current) use of aspirin: Secondary | ICD-10-CM | POA: Insufficient documentation

## 2016-03-21 DIAGNOSIS — M160 Bilateral primary osteoarthritis of hip: Secondary | ICD-10-CM | POA: Insufficient documentation

## 2016-03-21 DIAGNOSIS — G8929 Other chronic pain: Secondary | ICD-10-CM | POA: Insufficient documentation

## 2016-03-21 DIAGNOSIS — M546 Pain in thoracic spine: Secondary | ICD-10-CM | POA: Diagnosis present

## 2016-03-21 DIAGNOSIS — E785 Hyperlipidemia, unspecified: Secondary | ICD-10-CM | POA: Insufficient documentation

## 2016-03-21 DIAGNOSIS — F1190 Opioid use, unspecified, uncomplicated: Secondary | ICD-10-CM

## 2016-03-21 DIAGNOSIS — M4806 Spinal stenosis, lumbar region: Secondary | ICD-10-CM | POA: Diagnosis not present

## 2016-03-21 DIAGNOSIS — M150 Primary generalized (osteo)arthritis: Secondary | ICD-10-CM

## 2016-03-21 DIAGNOSIS — M4726 Other spondylosis with radiculopathy, lumbar region: Secondary | ICD-10-CM | POA: Diagnosis not present

## 2016-03-21 DIAGNOSIS — Z780 Asymptomatic menopausal state: Secondary | ICD-10-CM | POA: Insufficient documentation

## 2016-03-21 DIAGNOSIS — R252 Cramp and spasm: Secondary | ICD-10-CM | POA: Insufficient documentation

## 2016-03-21 DIAGNOSIS — E559 Vitamin D deficiency, unspecified: Secondary | ICD-10-CM | POA: Insufficient documentation

## 2016-03-21 DIAGNOSIS — M159 Polyosteoarthritis, unspecified: Secondary | ICD-10-CM

## 2016-03-21 MED ORDER — MAGNESIUM OXIDE 400 MG PO TABS: 400.0000 mg | ORAL_TABLET | Freq: Every day | ORAL | 2 refills | Status: DC

## 2016-03-21 MED ORDER — HYDROCODONE-ACETAMINOPHEN 5-325 MG PO TABS: 1.0000 | ORAL_TABLET | Freq: Four times a day (QID) | ORAL | 0 refills | Status: DC | PRN

## 2016-03-21 MED ORDER — CYCLOBENZAPRINE HCL 10 MG PO TABS: 10.0000 mg | ORAL_TABLET | Freq: Two times a day (BID) | ORAL | 2 refills | Status: DC | PRN

## 2016-03-21 MED ORDER — GABAPENTIN 100 MG PO CAPS: 100.0000 mg | ORAL_CAPSULE | Freq: Three times a day (TID) | ORAL | 2 refills | Status: DC

## 2016-03-21 NOTE — Progress Notes (Signed)
Safety precautions to be maintained throughout the outpatient stay will include: orient to surroundings, keep bed in low position, maintain call bell within reach at all times, provide assistance with transfer out of bed and ambulation.   Hydrocodone 5/325mg  #26 out of 120  Remaining. Filled 02-28-16.

## 2016-03-22 ENCOUNTER — Ambulatory Visit
Admission: RE | Admit: 2016-03-22 | Discharge: 2016-03-22 | Disposition: A | Discharge status: Home / Self Care | Payer: Commercial Managed Care - HMO | Source: Ambulatory Visit | Attending: Pain Medicine | Admitting: Pain Medicine

## 2016-03-22 DIAGNOSIS — M25551 Pain in right hip: Secondary | ICD-10-CM | POA: Diagnosis not present

## 2016-03-22 DIAGNOSIS — G8929 Other chronic pain: Secondary | ICD-10-CM | POA: Diagnosis not present

## 2016-03-22 DIAGNOSIS — M160 Bilateral primary osteoarthritis of hip: Secondary | ICD-10-CM

## 2016-03-22 DIAGNOSIS — M25552 Pain in left hip: Secondary | ICD-10-CM | POA: Insufficient documentation

## 2016-03-22 DIAGNOSIS — M25559 Pain in unspecified hip: Secondary | ICD-10-CM

## 2016-03-24 ENCOUNTER — Other Ambulatory Visit: Payer: Self-pay | Admitting: Obstetrics & Gynecology

## 2016-03-24 DIAGNOSIS — Z01419 Encounter for gynecological examination (general) (routine) without abnormal findings: Secondary | ICD-10-CM | POA: Diagnosis not present

## 2016-03-24 DIAGNOSIS — Z124 Encounter for screening for malignant neoplasm of cervix: Secondary | ICD-10-CM | POA: Diagnosis not present

## 2016-03-28 LAB — CYTOLOGY - PAP

## 2016-04-05 DIAGNOSIS — N83202 Unspecified ovarian cyst, left side: Secondary | ICD-10-CM | POA: Diagnosis not present

## 2016-05-31 NOTE — Progress Notes (Signed)
Patient's Name: Wanda [name]  MRN: [id number]  Referring Provider: Colon Branch, MD  DOB: 06/12/1946  PCP: Colon Branch, MD  DOS: 06/01/2016  Note by: Kathlen Brunswick. Dossie Arbour, MD  Service setting: Ambulatory outpatient  Specialty: Interventional Pain Management  Location: [facility] (AMB) Pain Management Facility    Patient type: Established   Primary Reason(s) for Visit: Encounter for prescription drug management (Level of risk: moderate) CC: Back Pain (middle ) and Knee Pain (left )  HPI  Wanda [name] is a 70 y.o. year old, female patient, who comes today for a medication management evaluation. 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 Her primarily concern today is the Back Pain (middle ) and Knee Pain (left )  Pain Assessment: Self-Reported Pain Score: 4 /10             Reported level is compatible with  observation.       Pain Type: Chronic pain Pain Location: Knee Pain Orientation: Lower Pain Descriptors / Indicators: Constant, Aching, Dull Pain Frequency: Constant  Wanda [name] was last seen on 03/21/2016 for medication management. During today's appointment we reviewed Wanda [name]'s chronic pain status, as well as her outpatient medication regimen.  The patient  reports that she does not use drugs. Her body mass index is 42.4 kg/m.  Further details on both, my assessment(s), as well as the proposed treatment plan, please see below.  Controlled Substance Pharmacotherapy Assessment REMS (Risk Evaluation and Mitigation Strategy)  Analgesic:Hydrocodone/APAP 5/325 one every 6 hours (20 mg/day of hydrocodone) MME/day:20 mg/day NAzalee Course RN  06/01/2016  8:53 AM  Sign at close encounter Nursing Pain Medication Assessment:  Safety precautions to be maintained throughout the outpatient stay will include: orient to surroundings, keep bed in low position, maintain call bell within reach at all times, provide assistance with transfer out of bed and ambulation.  Medication Inspection Compliance: Pill count conducted under aseptic conditions, in front of the patient. Neither the pills nor the bottle was removed from the patient's sight at any time. Once count was completed pills were immediately returned to the patient in their original bottle.  Medication: See above Pill Count: 120 of 120 pills remain Bottle Appearance: Standard pharmacy container. Clearly labeled. Filled Date: 184/ 07 / 2017 Medication last intake11/08/17/9pm    Pharmacokinetics: Liberation and absorption (onset of action): WNL Distribution (time to peak effect): WNL Metabolism and excretion (duration of action): WNL         Pharmacodynamics: Desired effects: Analgesia: The patient reports >50% benefit. Reported improvement in function: The patient reports medication allows her to accomplish basic ADLs. Clinically  meaningful improvement in function (CMIF): Sustained CMIF goals met Perceived effectiveness: Described as relatively effective, allowing for increase in activities of daily living (ADL) Undesirable effects: Side-effects or Adverse reactions: None reported Monitoring: [place] PMP: Online review of the past 21-monthperiod conducted. Compliant with practice rules and regulations List of all UDS test(s) done:  Lab Results  Component Value Date   TOXASSSELUR FINAL 12/07/2015   TOXASSSELUR FINAL 08/18/2015   TOXASSSELUR FINAL 05/20/2015   Last UDS on record: ToxAssure Select 13  Date Value Ref Range Status  12/07/2015 FINAL  Final    Comment:    ==================================================================== TOXASSURE SELECT 13 (MW) ==================================================================== Test                             Result       Flag       Units Drug Present and Declared for Prescription Verification   Hydrocodone                    331          EXPECTED   ng/mg creat   Hydromorphone                  92           EXPECTED   ng/mg creat   Dihydrocodeine                 81           EXPECTED   ng/mg creat   Norhydrocodone                 600          EXPECTED   ng/mg creat    Sources of hydrocodone include scheduled prescription    medications. Hydromorphone, dihydrocodeine and norhydrocodone are    expected metabolites of hydrocodone. Hydromorphone and    dihydrocodeine are also available as scheduled prescription    medications. ==================================================================== Test                      Result    Flag   Units      Ref Range   Creatinine              216              mg/dL      >=20 ==================================================================== Declared Medications:  The flagging and interpretation on this report are based on the  following declared medications.  Unexpected results may arise from  inaccuracies in the declared  medications.  **Note: The testing scope of this panel includes these medications:  Hydrocodone (Norco)  **Note: The testing scope of this panel does not include following  reported medications:  Acetaminophen (Norco)  Acyclovir (Zovirax)  Aspirin (Aspirin 81)  Atorvastatin (Lipitor)  Cyclobenzaprine (Flexeril)  Gabapentin (Neurontin)  Hydrochlorothiazide (Hydrodiuril)  Lisinopril (Prinivil)  Magnesium (Mag-Ox)  Sertraline (Zoloft)  Vitamin D ==================================================================== For clinical consultation, please call ([phone] ====================================================================    UDS interpretation: Compliant          Medication Assessment Form: Reviewed. Patient indicates being compliant with therapy Treatment compliance: Compliant Risk Assessment Profile: Aberrant behavior: See prior evaluations. None observed or detected today Comorbid factors increasing risk of overdose: See prior notes. No additional risks detected today Risk of substance use disorder (SUD): Low Opioid Risk Tool (ORT) Total Score: 1  Interpretation Table:  Score <3 = Low  Risk for SUD  Score between 4-7 = Moderate Risk for SUD  Score >8 = High Risk for Opioid Abuse   Risk Mitigation Strategies:  Patient Counseling: Covered Patient-Prescriber Agreement (PPA): Present and active  Notification to other healthcare providers: Done  Pharmacologic Plan: No change in therapy, at this time  Laboratory Chemistry  Inflammation Markers Lab Results  Component Value Date   ESRSEDRATE 28 08/18/2015   CRP <0.5 08/18/2015   Renal Function Lab Results  Component Value Date   BUN 17 08/18/2015   CREATININE 0.58 08/18/2015   GFRAA >60 08/18/2015   GFRNONAA >60 08/18/2015   Hepatic Function Lab Results  Component Value Date   AST 22 08/18/2015   ALT 25 08/18/2015   ALBUMIN 4.0 08/18/2015   Electrolytes Lab Results  Component Value Date   NA 139  08/18/2015   K 4.1 08/18/2015   CL 104 08/18/2015   CALCIUM 9.7 08/18/2015   MG 2.0 08/18/2015   Pain Modulating Vitamins Lab Results  Component Value Date   VD25OH 37 05/01/2011   VD125OH2TOT 58 02/01/2015   [id number] 58 02/01/2015   [id number] <8 02/01/2015   Coagulation Parameters Lab Results  Component Value Date   PLT 188.0 02/01/2015   Cardiovascular Lab Results  Component Value Date   HGB 13.8 02/01/2015   HCT 41.3 02/01/2015   Note: Lab results reviewed.  Recent Diagnostic Imaging Review  Dg Hip Unilat W Or W/o Pelvis 2-3 Views Left  Result Date: 03/22/2016 CLINICAL DATA:  Chronic pain EXAM: PELVIS AND BILATERAL HIPS:  4+ VIEWS COMPARISON:  None. FINDINGS: Standing frontal pelvis as well as standing frontal and lateral views of each hip-total five views -obtained. There is no fracture or dislocation. The joint spaces appear normal. No erosive change. IMPRESSION: No fracture or dislocation.  No apparent arthropathy. Electronically Signed   By: Lowella Grip III M.D.   On: 03/22/2016 11:23   Dg Hip Unilat W Or W/o Pelvis 2-3 Views Right  Result Date: 03/22/2016 CLINICAL DATA:  Chronic pain EXAM: PELVIS AND BILATERAL HIPS:  4+ VIEWS COMPARISON:  None. FINDINGS: Standing frontal pelvis as well as standing frontal and lateral views of each hip-total five views -obtained. There is no fracture or dislocation. The joint spaces appear normal. No erosive change. IMPRESSION: No fracture or dislocation.  No apparent arthropathy. Electronically Signed   By: Lowella Grip III M.D.   On: 03/22/2016 11:23   Note: Imaging results reviewed.  Meds  The patient has a current medication list which includes the following prescription(s): aspirin ec, atorvastatin, vitamin d, cyclobenzaprine, gabapentin, hydrochlorothiazide, hydrocodone-acetaminophen, hydrocodone-acetaminophen, hydrocodone-acetaminophen, lisinopril, magnesium oxide, and sertraline.  Current Outpatient Prescriptions on  File Prior to Visit  Medication Sig  . aspirin EC 81 MG tablet Take 81 mg by mouth daily.  [name] atorvastatin (LIPITOR) 10 MG tablet Take 1 tablet (10 mg total) by mouth at bedtime.  . Cholecalciferol (VITAMIN D) 2000 UNITS tablet Take 2,000 Units by mouth daily.  . hydrochlorothiazide (HYDRODIURIL) 25 MG tablet Take 1 tablet (25 mg total) by mouth daily.  [name] lisinopril (PRINIVIL,ZESTRIL) 10 MG tablet Take 1 tablet (10 mg total) by mouth daily.  . sertraline (ZOLOFT) 50 MG tablet Take 1.5 tablets (75 mg total) by mouth daily.   No current facility-administered medications on file prior to visit.    ROS  Constitutional: Denies any fever or chills Gastrointestinal: No reported hemesis, hematochezia, vomiting, or acute GI distress Musculoskeletal: Denies any acute onset joint swelling, redness, loss of  ROM, or weakness Neurological: No reported episodes of acute onset apraxia, aphasia, dysarthria, agnosia, amnesia, paralysis, loss of coordination, or loss of consciousness  Allergies  Wanda [name] is allergic to simvastatin.  [place]  Drug: Wanda [name]  reports that she does not use drugs. Alcohol:  reports that she does not drink alcohol. Tobacco:  reports that she has never smoked. She has never used smokeless tobacco. Medical:  has a past medical history of Allergy; Chronic back pain; Depression; DJD (degenerative joint disease); Hyperlipidemia; Hypertension; Menopause; and TMJ PAIN (05/17/2010). Family: family history includes Breast cancer in her sister; Cancer in her brother and sister; Colon cancer in her brother.  Past Surgical History:  Procedure Laterality Date  . G3 P3      Constitutional Exam  General appearance: Well nourished, well developed, and well hydrated. In no apparent acute distress Vitals:   06/01/16 0847  BP: 116/82  Pulse: 69  Temp: 98.3 F (36.8 C)  TempSrc: Oral  Weight: 247 lb (112 kg)  Height: '5\' 4"'$  (1.626 m)   BMI Assessment: Estimated body mass index is  42.4 kg/m as calculated from the following:   Height as of this encounter: '5\' 4"'$  (1.626 m).   Weight as of this encounter: 247 lb (112 kg).  BMI interpretation table: BMI level Category Range association with higher incidence of chronic pain  <18 kg/m2 Underweight   18.5-24.9 kg/m2 Ideal body weight   25-29.9 kg/m2 Overweight Increased incidence by 20%  30-34.9 kg/m2 Obese (Class I) Increased incidence by 68%  35-39.9 kg/m2 Severe obesity (Class II) Increased incidence by 136%  >40 kg/m2 Extreme obesity (Class III) Increased incidence by 254%   BMI Readings from Last 4 Encounters:  06/01/16 42.40 kg/m  03/21/16 42.51 kg/m  12/07/15 42.51 kg/m  09/28/15 42.26 kg/m   Wt Readings from Last 4 Encounters:  06/01/16 247 lb (112 kg)  03/21/16 240 lb (108.9 kg)  12/07/15 240 lb (108.9 kg)  09/28/15 246 lb 3.2 oz (111.7 kg)  Psych/Mental status: Alert, oriented x 3 (person, place, & time) Eyes: PERLA Respiratory: No evidence of acute respiratory distress  Cervical Spine Exam  Inspection: No masses, redness, or swelling Alignment: Symmetrical Functional ROM: Unrestricted ROM Stability: No instability detected Muscle strength & Tone: Functionally intact Sensory: Unimpaired Palpation: Non-contributory  Upper Extremity (UE) Exam    Side: Right upper extremity  Side: Left upper extremity  Inspection: No masses, redness, swelling, or asymmetry  Inspection: No masses, redness, swelling, or asymmetry  Functional ROM: Unrestricted ROM         Functional ROM: Unrestricted ROM          Muscle strength & Tone: Functionally intact  Muscle strength & Tone: Functionally intact  Sensory: Unimpaired  Sensory: Unimpaired  Palpation: Non-contributory  Palpation: Non-contributory   Thoracic Spine Exam  Inspection: No masses, redness, or swelling Alignment: Symmetrical Functional ROM: Unrestricted ROM Stability: No instability detected Sensory: Unimpaired Muscle strength & Tone:  Functionally intact Palpation: Non-contributory  Lumbar Spine Exam  Inspection: No masses, redness, or swelling Alignment: Symmetrical Functional ROM: Unrestricted ROM Stability: No instability detected Muscle strength & Tone: Functionally intact Sensory: Unimpaired Palpation: Non-contributory Provocative Tests: Lumbar Hyperextension and rotation test: evaluation deferred today       Patrick's Maneuver: evaluation deferred today              Gait & Posture Assessment  Ambulation: Unassisted Gait: Relatively normal for age and body habitus Posture: WNL   Lower Extremity Exam  Side: Right lower extremity  Side: Left lower extremity  Inspection: No masses, redness, swelling, or asymmetry  Inspection: No masses, redness, swelling, or asymmetry  Functional ROM: Unrestricted ROM          Functional ROM: Unrestricted ROM          Muscle strength & Tone: Functionally intact  Muscle strength & Tone: Functionally intact  Sensory: Unimpaired  Sensory: Unimpaired  Palpation: Non-contributory  Palpation: Non-contributory   Assessment  Primary Diagnosis & Pertinent Problem List: The primary encounter diagnosis was Chronic pain syndrome. Diagnoses of Long term current use of opiate analgesic, Opiate use (20 MME/Day), Neurogenic pain, Musculoskeletal pain, Chronic pain of both knees, and Primary osteoarthritis of both knees were also pertinent to this visit.  Visit Diagnosis: 1. Chronic pain syndrome   2. Long term current use of opiate analgesic   3. Opiate use (20 MME/Day)   4. Neurogenic pain   5. Musculoskeletal pain   6. Chronic pain of both knees   7. Primary osteoarthritis of both knees    Plan of Care  Pharmacotherapy (Medications Ordered): Meds ordered this encounter  Medications  . HYDROcodone-acetaminophen (NORCO/VICODIN) 5-325 MG tablet    Sig: Take 1 tablet by mouth every 6 (six) hours as needed for severe pain.    Dispense:  120 tablet    Refill:  0    Do not place  this medication, or any other prescription from our practice, on "Automatic Refill". Patient may have prescription filled one day early if pharmacy is closed on scheduled refill date. Do not fill until: 06/19/16 To last until: 07/19/16  . HYDROcodone-acetaminophen (NORCO/VICODIN) 5-325 MG tablet    Sig: Take 1 tablet by mouth every 6 (six) hours as needed for severe pain.    Dispense:  120 tablet    Refill:  0    Do not place this medication, or any other prescription from our practice, on "Automatic Refill". Patient may have prescription filled one day early if pharmacy is closed on scheduled refill date. Do not fill until: 07/19/16 To last until: 08/18/16  . HYDROcodone-acetaminophen (NORCO/VICODIN) 5-325 MG tablet    Sig: Take 1 tablet by mouth every 6 (six) hours as needed for severe pain.    Dispense:  120 tablet    Refill:  0    Do not place this medication, or any other prescription from our practice, on "Automatic Refill". Patient may have prescription filled one day early if pharmacy is closed on scheduled refill date. Do not fill until: 08/18/16 To last until: 09/17/16  . gabapentin (NEURONTIN) 100 MG capsule    Sig: Take 1 capsule (100 mg total) by mouth 3 (three) times daily.    Dispense:  90 capsule    Refill:  2    Do not place this medication, or any other prescription from our practice, on "Automatic Refill". Patient may have prescription filled one day early if pharmacy is closed on scheduled refill date.  . cyclobenzaprine (FLEXERIL) 10 MG tablet    Sig: Take 1 tablet (10 mg total) by mouth 2 (two) times daily as needed for muscle spasms.    Dispense:  60 tablet    Refill:  2    Do not place this medication, or any other prescription from our practice, on "Automatic Refill". Patient may have prescription filled one day early if pharmacy is closed on scheduled refill date.  . magnesium oxide (MAG-OX) 400 MG tablet    Sig: Take 1 tablet (400  mg total) by mouth daily.     Dispense:  30 tablet    Refill:  2    Do not place this medication, or any other prescription from our practice, on "Automatic Refill". Patient may have prescription filled one day early if pharmacy is closed on scheduled refill date.   New Prescriptions   No medications on file   Medications administered today: Wanda [name] had no medications administered during this visit. Lab-work, procedure(s), and/or referral(s): Orders Placed This Encounter  Procedures  . KNEE INJECTION   Imaging and/or referral(s): None  Interventional therapies: Planned, scheduled, and/or pending:   Diagnostic/palliative left intra-articular knee injection.    Considering:   She has a standing order for a when necessary lumbar facet block as well is a standing order for when necessary lumbar epidural steroid injection.    Palliative PRN treatment(s):   Diagnostic bilateral lumbar facet block under fluoroscopic guidance and IV sedation for the low back pain. Left L4-5 lumbar epidural steroid injection under fluoroscopic guidance, no sedation.    Provider-requested follow-up: Return in about 3 months (around 09/01/2016) for Med-Mgmt, in addition, procedure, (ASAP).  Future Appointments Date Time Provider Department Center  08/31/2016 8:15 AM Delano Metz, MD [facility] None   Primary Care Physician: Wanda Plump, MD Location: [facility] Outpatient Pain Management Facility Note by: Sydnee Levans. Laban Emperor, M.D, DABA, DABAPM, DABPM, DABIPP, FIPP  Pain Score Disclaimer: We use the NRS-11 scale. This is a self-reported, subjective measurement of pain severity with only modest accuracy. It is used primarily to identify changes within a particular patient. It must be understood that outpatient pain scales are significantly less accurate that those used for research, where they can be applied under ideal controlled circumstances with minimal exposure to variables. In reality, the score is likely to be a combination of pain  intensity and pain affect, where pain affect describes the degree of emotional arousal or changes in action readiness caused by the sensory experience of pain. Factors such as social and work situation, setting, emotional state, anxiety levels, expectation, and prior pain experience may influence pain perception and show large inter-individual differences that may also be affected by time variables.  Patient instructions provided during this appointment: Patient Instructions   GENERAL RISKS AND COMPLICATIONS  What are the risk, side effects and possible complications? Generally speaking, most procedures are safe.  However, with any procedure there are risks, side effects, and the possibility of complications.  The risks and complications are dependent upon the sites that are lesioned, or the type of nerve block to be performed.  The closer the procedure is to the spine, the more serious the risks are.  Great care is taken when placing the radio frequency needles, block needles or lesioning probes, but sometimes complications can occur. 1. Infection: Any time there is an injection through the skin, there is a risk of infection.  This is why sterile conditions are used for these blocks.  There are four possible types of infection. 1. Localized skin infection. 2. Central Nervous System Infection-This can be in the form of Meningitis, which can be deadly. 3. Epidural Infections-This can be in the form of an epidural abscess, which can cause pressure inside of the spine, causing compression of the spinal cord with subsequent paralysis. This would require an emergency surgery to decompress, and there are no guarantees that the patient would recover from the paralysis. 4. Discitis-This is an infection of the intervertebral discs.  It occurs in about 1% of  discography procedures.  It is difficult to treat and it may lead to surgery.        2. Pain: the needles have to go through skin and soft tissues, will  cause soreness.       3. Damage to internal structures:  The nerves to be lesioned may be near blood vessels or    other nerves which can be potentially damaged.       4. Bleeding: Bleeding is more common if the patient is taking blood thinners such as  aspirin, Coumadin, Ticiid, Plavix, etc., or if he/she have some genetic predisposition  such as hemophilia. Bleeding into the spinal canal can cause compression of the spinal  cord with subsequent paralysis.  This would require an emergency surgery to  decompress and there are no guarantees that the patient would recover from the  paralysis.       5. Pneumothorax:  Puncturing of a lung is a possibility, every time a needle is introduced in  the area of the chest or upper back.  Pneumothorax refers to free air around the  collapsed lung(s), inside of the thoracic cavity (chest cavity).  Another two possible  complications related to a similar event would include: Hemothorax and Chylothorax.   These are variations of the Pneumothorax, where instead of air around the collapsed  lung(s), you may have blood or chyle, respectively.       6. Spinal headaches: They may occur with any procedures in the area of the spine.       7. Persistent CSF (Cerebro-Spinal Fluid) leakage: This is a rare problem, but may occur  with prolonged intrathecal or epidural catheters either due to the formation of a fistulous  track or a dural tear.       8. Nerve damage: By working so close to the spinal cord, there is always a possibility of  nerve damage, which could be as serious as a permanent spinal cord injury with  paralysis.       9. Death:  Although rare, severe deadly allergic reactions known as "Anaphylactic  reaction" can occur to any of the medications used.      10. Worsening of the symptoms:  We can always make thing worse.  What are the chances of something like this happening? Chances of any of this occuring are extremely low.  By statistics, you have more of a chance  of getting killed in a motor vehicle accident: while driving to the hospital than any of the above occurring .  Nevertheless, you should be aware that they are possibilities.  In general, it is similar to taking a shower.  Everybody knows that you can slip, hit your head and get killed.  Does that mean that you should not shower again?  Nevertheless always keep in mind that statistics do not mean anything if you happen to be on the wrong side of them.  Even if a procedure has a 1 (one) in a 1,000,000 (million) chance of going wrong, it you happen to be that one..Also, keep in mind that by statistics, you have more of a chance of having something go wrong when taking medications.  Who should not have this procedure? If you are on a blood thinning medication (e.g. Coumadin, Plavix, see list of "Blood Thinners"), or if you have an active infection going on, you should not have the procedure.  If you are taking any blood thinners, please inform your physician.  How should I prepare for  this procedure?  Do not eat or drink anything at least six hours prior to the procedure.  Bring a driver with you .  It cannot be a taxi.  Come accompanied by an adult that can drive you back, and that is strong enough to help you if your legs get weak or numb from the local anesthetic.  Take all of your medicines the morning of the procedure with just enough water to swallow them.  If you have diabetes, make sure that you are scheduled to have your procedure done first thing in the morning, whenever possible.  If you have diabetes, take only half of your insulin dose and notify our nurse that you have done so as soon as you arrive at the clinic.  If you are diabetic, but only take blood sugar pills (oral hypoglycemic), then do not take them on the morning of your procedure.  You may take them after you have had the procedure.  Do not take aspirin or any aspirin-containing medications, at least eleven (11) days prior  to the procedure.  They may prolong bleeding.  Wear loose fitting clothing that may be easy to take off and that you would not mind if it got stained with Betadine or blood.  Do not wear any jewelry or perfume  Remove any nail coloring.  It will interfere with some of our monitoring equipment.  NOTE: Remember that this is not meant to be interpreted as a complete list of all possible complications.  Unforeseen problems may occur.  BLOOD THINNERS The following drugs contain aspirin or other products, which can cause increased bleeding during surgery and should not be taken for 2 weeks prior to and 1 week after surgery.  If you should need take something for relief of minor pain, you may take acetaminophen which is found in Tylenol,m Datril, Anacin-3 and Panadol. It is not blood thinner. The products listed below are.  Do not take any of the products listed below in addition to any listed on your instruction sheet.  A.P.C or A.P.C with Codeine Codeine Phosphate Capsules #3 Ibuprofen Ridaura  ABC compound Congesprin Imuran rimadil  Advil Cope Indocin Robaxisal  Alka-Seltzer Effervescent Pain Reliever and Antacid Coricidin or Coricidin-D  Indomethacin Rufen  Alka-Seltzer plus Cold Medicine Cosprin Ketoprofen S-A-C Tablets  Anacin Analgesic Tablets or Capsules Coumadin Korlgesic Salflex  Anacin Extra Strength Analgesic tablets or capsules CP-2 Tablets Lanoril Salicylate  Anaprox Cuprimine Capsules Levenox Salocol  Anexsia-D Dalteparin Magan Salsalate  Anodynos Darvon compound Magnesium Salicylate Sine-off  Ansaid Dasin Capsules Magsal Sodium Salicylate  Anturane Depen Capsules Marnal Soma  APF Arthritis pain formula Dewitt's Pills Measurin Stanback  Argesic Dia-Gesic Meclofenamic Sulfinpyrazone  Arthritis Bayer Timed Release Aspirin Diclofenac Meclomen Sulindac  Arthritis pain formula Anacin Dicumarol Medipren Supac  Analgesic (Safety coated) Arthralgen Diffunasal Mefanamic Suprofen    Arthritis Strength Bufferin Dihydrocodeine Mepro Compound Suprol  Arthropan liquid Dopirydamole Methcarbomol with Aspirin Synalgos  ASA tablets/Enseals Disalcid Micrainin Tagament  Ascriptin Doan's Midol Talwin  Ascriptin A/D Dolene Mobidin Tanderil  Ascriptin Extra Strength Dolobid Moblgesic Ticlid  Ascriptin with Codeine Doloprin or Doloprin with Codeine Momentum Tolectin  Asperbuf Duoprin Mono-gesic Trendar  Aspergum Duradyne Motrin or Motrin IB Triminicin  Aspirin plain, buffered or enteric coated Durasal Myochrisine Trigesic  Aspirin Suppositories Easprin Nalfon Trillsate  Aspirin with Codeine Ecotrin Regular or Extra Strength Naprosyn Uracel  Atromid-S Efficin Naproxen Ursinus  Auranofin Capsules Elmiron Neocylate Vanquish  Axotal Emagrin Norgesic Verin  Azathioprine Empirin or Empirin with  Codeine Normiflo Vitamin E  Azolid Emprazil Nuprin Voltaren  Bayer Aspirin plain, buffered or children's or timed BC Tablets or powders Encaprin Orgaran Warfarin Sodium  Buff-a-Comp Enoxaparin Orudis Zorpin  Buff-a-Comp with Codeine Equegesic Os-Cal-Gesic   Buffaprin Excedrin plain, buffered or Extra Strength Oxalid   Bufferin Arthritis Strength Feldene Oxphenbutazone   Bufferin plain or Extra Strength Feldene Capsules Oxycodone with Aspirin   Bufferin with Codeine Fenoprofen Fenoprofen Pabalate or Pabalate-SF   Buffets II Flogesic Panagesic   Buffinol plain or Extra Strength Florinal or Florinal with Codeine Panwarfarin   Buf-Tabs Flurbiprofen Penicillamine   Butalbital Compound Four-way cold tablets Penicillin   Butazolidin Fragmin Pepto-Bismol   Carbenicillin Geminisyn Percodan   Carna Arthritis Reliever Geopen Persantine   Carprofen Gold's salt Persistin   Chloramphenicol Goody's Phenylbutazone   Chloromycetin Haltrain Piroxlcam   Clmetidine heparin Plaquenil   Cllnoril Hyco-pap Ponstel   Clofibrate Hydroxy chloroquine Propoxyphen         Before stopping any of these medications,  be sure to consult the physician who ordered them.  Some, such as Coumadin (Warfarin) are ordered to prevent or treat serious conditions such as "deep thrombosis", "pumonary embolisms", and other heart problems.  The amount of time that you may need off of the medication may also vary with the medication and the reason for which you were taking it.  If you are taking any of these medications, please make sure you notify your pain physician before you undergo any procedures.         Knee Injection A knee injection is a procedure to get medicine into your knee joint. Your health care provider puts a needle into the joint and injects medicine with an attached syringe. The injected medicine may relieve the pain, swelling, and stiffness of arthritis. The injected medicine may also help to lubricate and cushion your knee joint. You may need more than one injection. LET [facility] CARE PROVIDER KNOW ABOUT:  Any allergies you have.  All medicines you are taking, including vitamins, herbs, eye drops, creams, and over-the-counter medicines.  Previous problems you or members of your family have had with the use of anesthetics.  Any blood disorders you have.  Previous surgeries you have had.  Any medical conditions you may have. RISKS AND COMPLICATIONS Generally, this is a safe procedure. However, problems may occur, including:  Infection.  Bleeding.  Worsening symptoms.  Damage to the area around your knee.  Allergic reaction to any of the medicines.  Skin reactions from repeated injections. BEFORE THE PROCEDURE  Ask your health care provider about changing or stopping your regular medicines. This is especially important if you are taking diabetes medicines or blood thinners.  Plan to have someone take you home after the procedure. PROCEDURE  You will sit or lie down in a position for your knee to be treated.  The skin over your kneecap will be cleaned with a germ-killing  solution (antiseptic).  You will be given a medicine that numbs the area (local anesthetic). You may feel some stinging.  After your knee becomes numb, you will have a second injection. This is the medicine. This needle is carefully placed between your kneecap and your knee. The medicine is injected into the joint space.  At the end of the procedure, the needle will be removed.  A bandage (dressing) may be placed over the injection site. The procedure may vary among health care providers and hospitals. AFTER THE PROCEDURE  You may have to move your  knee through its full range of motion. This helps to get all of the medicine into your joint space.  Your blood pressure, heart rate, breathing rate, and blood oxygen level will be monitored often until the medicines you were given have worn off.  You will be watched to make sure that you do not have a reaction to the injected medicine.   This information is not intended to replace advice given to you by your health care provider. Make sure you discuss any questions you have with your health care provider.   Document Released: 10/01/2006 Document Revised: 07/31/2014 Document Reviewed: 05/20/2014 Elsevier Interactive Patient Education [employer].

## 2016-06-01 ENCOUNTER — Encounter: Payer: Self-pay | Admitting: Pain Medicine

## 2016-06-01 ENCOUNTER — Ambulatory Visit: Payer: Commercial Managed Care - HMO | Attending: Pain Medicine | Admitting: Pain Medicine

## 2016-06-01 VITALS — BP 116/82 | HR 69 | Temp 98.3°F | Ht 64.0 in | Wt 247.0 lb

## 2016-06-01 DIAGNOSIS — Z79891 Long term (current) use of opiate analgesic: Secondary | ICD-10-CM | POA: Insufficient documentation

## 2016-06-01 DIAGNOSIS — Z760 Encounter for issue of repeat prescription: Secondary | ICD-10-CM | POA: Insufficient documentation

## 2016-06-01 DIAGNOSIS — M25562 Pain in left knee: Secondary | ICD-10-CM

## 2016-06-01 DIAGNOSIS — G894 Chronic pain syndrome: Secondary | ICD-10-CM

## 2016-06-01 DIAGNOSIS — M792 Neuralgia and neuritis, unspecified: Secondary | ICD-10-CM | POA: Diagnosis not present

## 2016-06-01 DIAGNOSIS — M791 Myalgia: Secondary | ICD-10-CM

## 2016-06-01 DIAGNOSIS — G8929 Other chronic pain: Secondary | ICD-10-CM

## 2016-06-01 DIAGNOSIS — F1190 Opioid use, unspecified, uncomplicated: Secondary | ICD-10-CM

## 2016-06-01 DIAGNOSIS — Z79899 Other long term (current) drug therapy: Secondary | ICD-10-CM | POA: Insufficient documentation

## 2016-06-01 DIAGNOSIS — Z7982 Long term (current) use of aspirin: Secondary | ICD-10-CM | POA: Diagnosis not present

## 2016-06-01 DIAGNOSIS — M170 Bilateral primary osteoarthritis of knee: Secondary | ICD-10-CM | POA: Insufficient documentation

## 2016-06-01 DIAGNOSIS — M25561 Pain in right knee: Secondary | ICD-10-CM

## 2016-06-01 DIAGNOSIS — M7918 Myalgia, other site: Secondary | ICD-10-CM

## 2016-06-01 MED ORDER — HYDROCODONE-ACETAMINOPHEN 5-325 MG PO TABS: 1.0000 | ORAL_TABLET | Freq: Four times a day (QID) | ORAL | 0 refills | Status: DC | PRN

## 2016-06-01 MED ORDER — GABAPENTIN 100 MG PO CAPS: 100.0000 mg | ORAL_CAPSULE | Freq: Three times a day (TID) | ORAL | 2 refills | Status: DC

## 2016-06-01 MED ORDER — CYCLOBENZAPRINE HCL 10 MG PO TABS: 10.0000 mg | ORAL_TABLET | Freq: Two times a day (BID) | ORAL | 2 refills | Status: DC | PRN

## 2016-06-01 MED ORDER — MAGNESIUM OXIDE 400 MG PO TABS: 400.0000 mg | ORAL_TABLET | Freq: Every day | ORAL | 2 refills | Status: DC

## 2016-06-01 NOTE — Patient Instructions (Signed)
GENERAL RISKS AND COMPLICATIONS  What are the risk, side effects and possible complications? Generally speaking, most procedures are safe.  However, with any procedure there are risks, side effects, and the possibility of complications.  The risks and complications are dependent upon the sites that are lesioned, or the type of nerve block to be performed.  The closer the procedure is to the spine, the more serious the risks are.  Great care is taken when placing the radio frequency needles, block needles or lesioning probes, but sometimes complications can occur. 1. Infection: Any time there is an injection through the skin, there is a risk of infection.  This is why sterile conditions are used for these blocks.  There are four possible types of infection. 1. Localized skin infection. 2. Central Nervous System Infection-This can be in the form of Meningitis, which can be deadly. 3. Epidural Infections-This can be in the form of an epidural abscess, which can cause pressure inside of the spine, causing compression of the spinal cord with subsequent paralysis. This would require an emergency surgery to decompress, and there are no guarantees that the patient would recover from the paralysis. 4. Discitis-This is an infection of the intervertebral discs.  It occurs in about 1% of discography procedures.  It is difficult to treat and it may lead to surgery.        2. Pain: the needles have to go through skin and soft tissues, will cause soreness.       3. Damage to internal structures:  The nerves to be lesioned may be near blood vessels or    other nerves which can be potentially damaged.       4. Bleeding: Bleeding is more common if the patient is taking blood thinners such as  aspirin, Coumadin, Ticiid, Plavix, etc., or if he/she have some genetic predisposition  such as hemophilia. Bleeding into the spinal canal can cause compression of the spinal  cord with subsequent paralysis.  This would require an  emergency surgery to  decompress and there are no guarantees that the patient would recover from the  paralysis.       5. Pneumothorax:  Puncturing of a lung is a possibility, every time a needle is introduced in  the area of the chest or upper back.  Pneumothorax refers to free air around the  collapsed lung(s), inside of the thoracic cavity (chest cavity).  Another two possible  complications related to a similar event would include: Hemothorax and Chylothorax.   These are variations of the Pneumothorax, where instead of air around the collapsed  lung(s), you may have blood or chyle, respectively.       6. Spinal headaches: They may occur with any procedures in the area of the spine.       7. Persistent CSF (Cerebro-Spinal Fluid) leakage: This is a rare problem, but may occur  with prolonged intrathecal or epidural catheters either due to the formation of a fistulous  track or a dural tear.       8. Nerve damage: By working so close to the spinal cord, there is always a possibility of  nerve damage, which could be as serious as a permanent spinal cord injury with  paralysis.       9. Death:  Although rare, severe deadly allergic reactions known as "Anaphylactic  reaction" can occur to any of the medications used.      10. Worsening of the symptoms:  We can always make thing worse.    What are the chances of something like this happening? Chances of any of this occuring are extremely low.  By statistics, you have more of a chance of getting killed in a motor vehicle accident: while driving to the hospital than any of the above occurring .  Nevertheless, you should be aware that they are possibilities.  In general, it is similar to taking a shower.  Everybody knows that you can slip, hit your head and get killed.  Does that mean that you should not shower again?  Nevertheless always keep in mind that statistics do not mean anything if you happen to be on the wrong side of them.  Even if a procedure has a 1  (one) in a 1,000,000 (million) chance of going wrong, it you happen to be that one..Also, keep in mind that by statistics, you have more of a chance of having something go wrong when taking medications.  Who should not have this procedure? If you are on a blood thinning medication (e.g. Coumadin, Plavix, see list of "Blood Thinners"), or if you have an active infection going on, you should not have the procedure.  If you are taking any blood thinners, please inform your physician.  How should I prepare for this procedure?  Do not eat or drink anything at least six hours prior to the procedure.  Bring a driver with you .  It cannot be a taxi.  Come accompanied by an adult that can drive you back, and that is strong enough to help you if your legs get weak or numb from the local anesthetic.  Take all of your medicines the morning of the procedure with just enough water to swallow them.  If you have diabetes, make sure that you are scheduled to have your procedure done first thing in the morning, whenever possible.  If you have diabetes, take only half of your insulin dose and notify our nurse that you have done so as soon as you arrive at the clinic.  If you are diabetic, but only take blood sugar pills (oral hypoglycemic), then do not take them on the morning of your procedure.  You may take them after you have had the procedure.  Do not take aspirin or any aspirin-containing medications, at least eleven (11) days prior to the procedure.  They may prolong bleeding.  Wear loose fitting clothing that may be easy to take off and that you would not mind if it got stained with Betadine or blood.  Do not wear any jewelry or perfume  Remove any nail coloring.  It will interfere with some of our monitoring equipment.  NOTE: Remember that this is not meant to be interpreted as a complete list of all possible complications.  Unforeseen problems may occur.  BLOOD THINNERS The following drugs  contain aspirin or other products, which can cause increased bleeding during surgery and should not be taken for 2 weeks prior to and 1 week after surgery.  If you should need take something for relief of minor pain, you may take acetaminophen which is found in Tylenol,m Datril, Anacin-3 and Panadol. It is not blood thinner. The products listed below are.  Do not take any of the products listed below in addition to any listed on your instruction sheet.  A.P.C or A.P.C with Codeine Codeine Phosphate Capsules #3 Ibuprofen Ridaura  ABC compound Congesprin Imuran rimadil  Advil Cope Indocin Robaxisal  Alka-Seltzer Effervescent Pain Reliever and Antacid Coricidin or Coricidin-D  Indomethacin Rufen    Alka-Seltzer plus Cold Medicine Cosprin Ketoprofen S-A-C Tablets  Anacin Analgesic Tablets or Capsules Coumadin Korlgesic Salflex  Anacin Extra Strength Analgesic tablets or capsules CP-2 Tablets Lanoril Salicylate  Anaprox Cuprimine Capsules Levenox Salocol  Anexsia-D Dalteparin Magan Salsalate  Anodynos Darvon compound Magnesium Salicylate Sine-off  Ansaid Dasin Capsules Magsal Sodium Salicylate  Anturane Depen Capsules Marnal Soma  APF Arthritis pain formula Dewitt's Pills Measurin Stanback  Argesic Dia-Gesic Meclofenamic Sulfinpyrazone  Arthritis Bayer Timed Release Aspirin Diclofenac Meclomen Sulindac  Arthritis pain formula Anacin Dicumarol Medipren Supac  Analgesic (Safety coated) Arthralgen Diffunasal Mefanamic Suprofen  Arthritis Strength Bufferin Dihydrocodeine Mepro Compound Suprol  Arthropan liquid Dopirydamole Methcarbomol with Aspirin Synalgos  ASA tablets/Enseals Disalcid Micrainin Tagament  Ascriptin Doan's Midol Talwin  Ascriptin A/D Dolene Mobidin Tanderil  Ascriptin Extra Strength Dolobid Moblgesic Ticlid  Ascriptin with Codeine Doloprin or Doloprin with Codeine Momentum Tolectin  Asperbuf Duoprin Mono-gesic Trendar  Aspergum Duradyne Motrin or Motrin IB Triminicin  Aspirin  plain, buffered or enteric coated Durasal Myochrisine Trigesic  Aspirin Suppositories Easprin Nalfon Trillsate  Aspirin with Codeine Ecotrin Regular or Extra Strength Naprosyn Uracel  Atromid-S Efficin Naproxen Ursinus  Auranofin Capsules Elmiron Neocylate Vanquish  Axotal Emagrin Norgesic Verin  Azathioprine Empirin or Empirin with Codeine Normiflo Vitamin E  Azolid Emprazil Nuprin Voltaren  Bayer Aspirin plain, buffered or children's or timed BC Tablets or powders Encaprin Orgaran Warfarin Sodium  Buff-a-Comp Enoxaparin Orudis Zorpin  Buff-a-Comp with Codeine Equegesic Os-Cal-Gesic   Buffaprin Excedrin plain, buffered or Extra Strength Oxalid   Bufferin Arthritis Strength Feldene Oxphenbutazone   Bufferin plain or Extra Strength Feldene Capsules Oxycodone with Aspirin   Bufferin with Codeine Fenoprofen Fenoprofen Pabalate or Pabalate-SF   Buffets II Flogesic Panagesic   Buffinol plain or Extra Strength Florinal or Florinal with Codeine Panwarfarin   Buf-Tabs Flurbiprofen Penicillamine   Butalbital Compound Four-way cold tablets Penicillin   Butazolidin Fragmin Pepto-Bismol   Carbenicillin Geminisyn Percodan   Carna Arthritis Reliever Geopen Persantine   Carprofen Gold's salt Persistin   Chloramphenicol Goody's Phenylbutazone   Chloromycetin Haltrain Piroxlcam   Clmetidine heparin Plaquenil   Cllnoril Hyco-pap Ponstel   Clofibrate Hydroxy chloroquine Propoxyphen         Before stopping any of these medications, be sure to consult the physician who ordered them.  Some, such as Coumadin (Warfarin) are ordered to prevent or treat serious conditions such as "deep thrombosis", "pumonary embolisms", and other heart problems.  The amount of time that you may need off of the medication may also vary with the medication and the reason for which you were taking it.  If you are taking any of these medications, please make sure you notify your pain physician before you undergo any  procedures.         Knee Injection A knee injection is a procedure to get medicine into your knee joint. Your health care provider puts a needle into the joint and injects medicine with an attached syringe. The injected medicine may relieve the pain, swelling, and stiffness of arthritis. The injected medicine may also help to lubricate and cushion your knee joint. You may need more than one injection. LET [facility] CARE PROVIDER KNOW ABOUT:  Any allergies you have.  All medicines you are taking, including vitamins, herbs, eye drops, creams, and over-the-counter medicines.  Previous problems you or members of your family have had with the use of anesthetics.  Any blood disorders you have.  Previous surgeries you have had.  Any medical conditions you  may have. RISKS AND COMPLICATIONS Generally, this is a safe procedure. However, problems may occur, including:  Infection.  Bleeding.  Worsening symptoms.  Damage to the area around your knee.  Allergic reaction to any of the medicines.  Skin reactions from repeated injections. BEFORE THE PROCEDURE  Ask your health care provider about changing or stopping your regular medicines. This is especially important if you are taking diabetes medicines or blood thinners.  Plan to have someone take you home after the procedure. PROCEDURE  You will sit or lie down in a position for your knee to be treated.  The skin over your kneecap will be cleaned with a germ-killing solution (antiseptic).  You will be given a medicine that numbs the area (local anesthetic). You may feel some stinging.  After your knee becomes numb, you will have a second injection. This is the medicine. This needle is carefully placed between your kneecap and your knee. The medicine is injected into the joint space.  At the end of the procedure, the needle will be removed.  A bandage (dressing) may be placed over the injection site. The procedure may vary  among health care providers and hospitals. AFTER THE PROCEDURE  You may have to move your knee through its full range of motion. This helps to get all of the medicine into your joint space.  Your blood pressure, heart rate, breathing rate, and blood oxygen level will be monitored often until the medicines you were given have worn off.  You will be watched to make sure that you do not have a reaction to the injected medicine.   This information is not intended to replace advice given to you by your health care provider. Make sure you discuss any questions you have with your health care provider.   Document Released: 10/01/2006 Document Revised: 07/31/2014 Document Reviewed: 05/20/2014 Elsevier Interactive Patient Education [employer].

## 2016-06-01 NOTE — Progress Notes (Signed)
Nursing Pain Medication Assessment:  Safety precautions to be maintained throughout the outpatient stay will include: orient to surroundings, keep bed in low position, maintain call bell within reach at all times, provide assistance with transfer out of bed and ambulation.  Medication Inspection Compliance: Pill count conducted under aseptic conditions, in front of the patient. Neither the pills nor the bottle was removed from the patient's sight at any time. Once count was completed pills were immediately returned to the patient in their original bottle.  Medication: See above Pill Count: 120 of 120 pills remain Bottle Appearance: Standard pharmacy container. Clearly labeled. Filled Date: 83 / 07 / 2017 Medication last intake11/08/17/9pm

## 2016-06-07 ENCOUNTER — Encounter: Payer: Commercial Managed Care - HMO | Admitting: Pain Medicine

## 2016-07-10 ENCOUNTER — Ambulatory Visit: Payer: Commercial Managed Care - HMO | Admitting: Pain Medicine

## 2016-07-26 ENCOUNTER — Other Ambulatory Visit: Payer: Self-pay | Admitting: Internal Medicine

## 2016-07-26 DIAGNOSIS — R921 Mammographic calcification found on diagnostic imaging of breast: Secondary | ICD-10-CM

## 2016-08-07 ENCOUNTER — Ambulatory Visit
Admission: RE | Admit: 2016-08-07 | Discharge: 2016-08-07 | Disposition: A | Discharge status: Home / Self Care | Payer: Commercial Managed Care - HMO | Source: Ambulatory Visit | Attending: Internal Medicine | Admitting: Internal Medicine

## 2016-08-07 ENCOUNTER — Other Ambulatory Visit: Payer: Self-pay | Admitting: Internal Medicine

## 2016-08-07 DIAGNOSIS — R921 Mammographic calcification found on diagnostic imaging of breast: Secondary | ICD-10-CM

## 2016-08-09 ENCOUNTER — Other Ambulatory Visit: Payer: Self-pay | Admitting: Internal Medicine

## 2016-08-17 ENCOUNTER — Telehealth: Payer: Self-pay | Admitting: Internal Medicine

## 2016-08-17 NOTE — Telephone Encounter (Signed)
Lisinopril refilled for 30 day supply. Pt overdue for appt. Shiquita-please call the Pt and schedule appt at her convenience. Thank you.

## 2016-08-22 NOTE — Telephone Encounter (Signed)
Pt has been scheduled.  °

## 2016-08-25 ENCOUNTER — Telehealth: Payer: Self-pay | Admitting: *Deleted

## 2016-08-25 NOTE — Telephone Encounter (Signed)
AWV@ 9 08/30/16.

## 2016-08-28 NOTE — Progress Notes (Signed)
Pre visit review using our clinic review tool, if applicable. No additional management support is needed unless otherwise documented below in the visit note. 

## 2016-08-28 NOTE — Progress Notes (Signed)
Subjective:   Wanda [name] is a 71 y.o. female who presents for Medicare Annual (Subsequent) preventive examination.  Review of Systems:  No ROS.  Medicare Wellness Visit.  Cardiac Risk Factors include: advanced age (>55men, >45 women);obesity (BMI >30kg/m2);sedentary lifestyle Sleep patterns: Sleeps about 6 hrs sleep. Feels rested occasionally. Wakes up in the night often worried about husband Home Safety/Smoke Alarms:  Feels safe in home. Smoke, security, and carbon monoxide alarms in place.  Living environment; residence and Firearm Safety: Lives in home with husband and son. 2 story home.  No guns Seat Belt Safety/Bike Helmet: Wears seat belt.   Counseling:   [facility] annually. Dental- Dr.Kathy. New.  Female:   Pap- Last 03/24/16: Normal.       Mammo- Last 08/07/16:  BI-RADS CATEGORY  3: Probably benign. Dexa scan- Last 05/11/11: Normal ORDERED TODAY.        CCS- Last 09/18/12: Normal      Objective:     Vitals: BP 124/68 (BP Location: Left Arm, Patient Position: Sitting, Cuff Size: Normal)   Pulse 65   Temp 97.5 F (36.4 C) (Oral)   Resp 14   Ht 5\' 4"  (1.626 m)   Wt 247 lb (112 kg)   SpO2 97%   BMI 42.40 kg/m   Body mass index is 42.4 kg/m.   Tobacco History  Smoking Status  . Never Smoker  Smokeless Tobacco  . Never Used     Counseling given: No   Past Medical History:  Diagnosis Date  . Allergy    RHINITIS  . Chronic back pain   . Depression   . DJD (degenerative joint disease)    back pain , on disability  . Hyperlipidemia   . Hypertension   . Menopause    onset age 62   . TMJ PAIN 05/17/2010   Qualifier: Diagnosis of  By: Larose Kells MD, [place]    Past Surgical History:  Procedure Laterality Date  . G3 P3      Family History  Problem Relation Age of Onset  . Cancer Sister     ??CERVICAL  . Cancer Brother     STOMACH  . Breast cancer Sister     sister dx age 72, aunt, cousin x 2   . Colon cancer Brother     dx age ~ 19  ?  [name] Diabetes      aunt   . Coronary artery disease Neg Hx   . CAD Neg Hx    History  Sexual Activity  . Sexual activity: No    Outpatient Encounter Prescriptions as of 08/30/2016  Medication Sig  . aspirin EC 81 MG tablet Take 81 mg by mouth daily.  [name] atorvastatin (LIPITOR) 10 MG tablet Take 1 tablet (10 mg total) by mouth at bedtime.  . Cholecalciferol (VITAMIN D) 2000 UNITS tablet Take 2,000 Units by mouth daily.  . cyclobenzaprine (FLEXERIL) 10 MG tablet Take 1 tablet (10 mg total) by mouth 2 (two) times daily as needed for muscle spasms.  [name] gabapentin (NEURONTIN) 100 MG capsule Take 1 capsule (100 mg total) by mouth 3 (three) times daily.  . hydrochlorothiazide (HYDRODIURIL) 25 MG tablet Take 1 tablet (25 mg total) by mouth daily.  [name] HYDROcodone-acetaminophen (NORCO/VICODIN) 5-325 MG tablet Take 1 tablet by mouth every 6 (six) hours as needed for severe pain.  [name] lisinopril (PRINIVIL,ZESTRIL) 10 MG tablet Take 1 tablet (10 mg total) by mouth daily.  . magnesium oxide (MAG-OX) 400  MG tablet Take 1 tablet (400 mg total) by mouth daily.  . sertraline (ZOLOFT) 50 MG tablet Take 1.5 tablets (75 mg total) by mouth daily.  [name] HYDROcodone-acetaminophen (NORCO/VICODIN) 5-325 MG tablet Take 1 tablet by mouth every 6 (six) hours as needed for severe pain.  [name] HYDROcodone-acetaminophen (NORCO/VICODIN) 5-325 MG tablet Take 1 tablet by mouth every 6 (six) hours as needed for severe pain.   No facility-administered encounter medications on file as of 08/30/2016.     Activities of Daily Living In your present state of health, do you have any difficulty performing the following activities: 08/30/2016  Hearing? N  Vision? N  Difficulty concentrating or making decisions? N  Walking or climbing stairs? N  Dressing or bathing? N  Doing errands, shopping? N  Preparing Food and eating ? N  Using the Toilet? N  In the past six months, have you accidently leaked urine? N  Do you have problems with loss of  bowel control? N  Managing your Medications? N  Managing your Finances? N  Housekeeping or managing your Housekeeping? N  Some recent data might be hidden    Patient Care Team: Colon Branch, MD as PCP - General Milinda Pointer, MD as Referring Physician (Pain Medicine) Alden Hipp, MD as Consulting Physician (Obstetrics and Gynecology)    Assessment:    Physical assessment deferred to PCP.  Exercise Activities and Dietary recommendations Current Exercise Habits: The patient does not participate in regular exercise at present   Diet (meal preparation, eat out, water intake, caffeinated beverages, dairy products, fruits and vegetables): 24 HOUR RECALL:  Breakfast: Coffee Lunch: Sandwich or salad.  Dinner:  Starch, green, meat, and bread. Ice cream and sweets.    Only 1 bottle of water per day. PROPER NUTRITION DISCUSSED.  Goals    . Weight (lb) < 215 lb (97.5 kg)          Plans to start going to pool at least 2x/week and eat better and drink more water.      Fall Risk Fall Risk  08/30/2016 06/01/2016 03/21/2016 12/07/2015 08/18/2015  Falls in the past year? No No No No No   Depression Screen PHQ 2/9 Scores 08/30/2016 06/01/2016 03/21/2016 12/07/2015  PHQ - 2 Score 0 0 0 0  Exception Documentation - - - -     Cognitive Function MMSE - Mini Mental State Exam 08/30/2016  Orientation to time 5  Orientation to Place 5  Registration 3  Attention/ Calculation 5  Recall 2  Language- name 2 objects 2  Language- repeat 1  Language- follow 3 step command 3  Language- read & follow direction 1  Write a sentence 1  Copy design 1  Total score 29        Immunization History  Administered Date(s) Administered  . Influenza Split 05/01/2011, 03/25/2014  . Influenza Whole 04/17/2008, 05/05/2009, 04/11/2010  . Influenza, High Dose Seasonal PF 05/07/2013  . Influenza-Unspecified 04/01/2015, 05/01/2016  . Pneumococcal Conjugate-13 04/16/2014  . Pneumococcal Polysaccharide-23  04/01/2015  . Td 04/17/2008   Screening Tests Health Maintenance  Topic Date Due  . Hepatitis C Screening  [birth year]  . MAMMOGRAM  08/07/2017  . COLONOSCOPY  09/18/2017  . TETANUS/TDAP  04/17/2018  . INFLUENZA VACCINE  Completed  . DEXA SCAN  Completed  . ZOSTAVAX  Completed  . PNA vac Low Risk Adult  Completed      Plan:     Eat heart healthy diet (full of fruits, vegetables, whole grains, lean  protein, water--limit salt, fat, and sugar intake) and increase physical activity as tolerated.  Schedule Bone Density Scan-order placed today.  Bring a copy of your advance directives to your next office visit.   During the course of the visit the patient was educated and counseled about the following appropriate screening and preventive services:   Vaccines to include Pneumoccal, Influenza, Hepatitis B, Td, Zostavax, HCV-UTD  Cardiovascular Disease  Colorectal cancer screening-UTD  Bone density screening-ORDEREDTODAY  Diabetes screening  Glaucoma screening  Mammography/PAP-UTD  Nutrition counseling   Patient Instructions (the written plan) was given to the patient.   Naaman Plummer [place], [place]  08/30/2016  Kathlene November, MD

## 2016-08-30 ENCOUNTER — Ambulatory Visit (INDEPENDENT_AMBULATORY_CARE_PROVIDER_SITE_OTHER): Payer: Medicare HMO | Admitting: Internal Medicine

## 2016-08-30 ENCOUNTER — Encounter: Payer: Self-pay | Admitting: Internal Medicine

## 2016-08-30 VITALS — BP 124/68 | HR 65 | Temp 97.5°F | Resp 14 | Ht 64.0 in | Wt 247.0 lb

## 2016-08-30 DIAGNOSIS — Z780 Asymptomatic menopausal state: Secondary | ICD-10-CM

## 2016-08-30 DIAGNOSIS — Z1159 Encounter for screening for other viral diseases: Secondary | ICD-10-CM

## 2016-08-30 DIAGNOSIS — Z0000 Encounter for general adult medical examination without abnormal findings: Secondary | ICD-10-CM

## 2016-08-30 LAB — LIPID PANEL
CHOLESTEROL: 217 mg/dL — AB (ref 0–200)
HDL: 64.8 mg/dL (ref >39.00)
LDL CALC: 142 mg/dL — AB (ref 0–99)
NonHDL: 152.17
Total CHOL/HDL Ratio: 3
Triglycerides: 50 mg/dL (ref 0.0–149.0)
VLDL: 10 mg/dL (ref 0.0–40.0)

## 2016-08-30 LAB — COMPREHENSIVE METABOLIC PANEL
ALBUMIN: 4.2 g/dL (ref 3.5–5.2)
ALK PHOS: 74 U/L (ref 39–117)
ALT: 19 U/L (ref 0–35)
AST: 19 U/L (ref 0–37)
BUN: 10 mg/dL (ref 6–23)
CHLORIDE: 102 meq/L (ref 96–112)
CO2: 29 mEq/L (ref 19–32)
CREATININE: 0.65 mg/dL (ref 0.40–1.20)
Calcium: 9.8 mg/dL (ref 8.4–10.5)
GFR: 116.51 mL/min (ref >60.00)
Glucose, Bld: 105 mg/dL — ABNORMAL HIGH (ref 70–99)
Potassium: 4.2 mEq/L (ref 3.5–5.1)
Sodium: 136 mEq/L (ref 135–145)
Total Bilirubin: 0.4 mg/dL (ref 0.2–1.2)
Total Protein: 7.4 g/dL (ref 6.0–8.3)

## 2016-08-30 LAB — CBC WITH DIFFERENTIAL/PLATELET
BASOS PCT: 0.7 % (ref 0.0–3.0)
Basophils Absolute: 0 10*3/uL (ref 0.0–0.1)
EOS ABS: 0.1 10*3/uL (ref 0.0–0.7)
EOS PCT: 2.7 % (ref 0.0–5.0)
HEMATOCRIT: 40.9 % (ref 36.0–46.0)
HEMOGLOBIN: 13.8 g/dL (ref 12.0–15.0)
LYMPHS PCT: 35.2 % (ref 12.0–46.0)
Lymphs Abs: 1.7 10*3/uL (ref 0.7–4.0)
MCHC: 33.8 g/dL (ref 30.0–36.0)
MCV: 93 fl (ref 78.0–100.0)
MONOS PCT: 8.2 % (ref 3.0–12.0)
Monocytes Absolute: 0.4 10*3/uL (ref 0.1–1.0)
NEUTROS ABS: 2.6 10*3/uL (ref 1.4–7.7)
Neutrophils Relative %: 53.2 % (ref 43.0–77.0)
PLATELETS: 176 10*3/uL (ref 150.0–400.0)
RBC: 4.4 Mil/uL (ref 3.87–5.11)
RDW: 15 % (ref 11.5–15.5)
WBC: 4.9 10*3/uL (ref 4.0–10.5)

## 2016-08-30 LAB — HEPATITIS C ANTIBODY: HCV Ab: NEGATIVE

## 2016-08-30 NOTE — Assessment & Plan Note (Addendum)
Td 2009;;  prevnar 03-2014 ;  pnm  shot ~ 03-2015;  S/p zostavax, had a flu shot    Female care Bone density test, normal  04/2008 and 04-2011 [facility]= 07-2016 Saw Dr Deatra Ina , gynecology, [phone], PAP done, was told to RTC prn  CCS:+ FH colon cancer cscope 2014 neg, was rec 5 years d/t FH  Diet-exercise discussed

## 2016-08-30 NOTE — Patient Instructions (Addendum)
GO TO THE LAB : Get the blood work     GO TO THE FRONT DESK Schedule your next appointment for a  physical exam in one year   Check the  blood pressure 2 or 3 times a month   Be sure your blood pressure is between 110/65 and  140/85. If it is consistently higher or lower, let me know     Eat heart healthy diet (full of fruits, vegetables, whole grains, lean protein, water--limit salt, fat, and sugar intake) and increase physical activity as tolerated.  Schedule Bone Density Scan-order placed today.  Bring a copy of your advance directives to your next office visit.

## 2016-08-30 NOTE — Progress Notes (Signed)
Subjective:    Patient ID: Wanda [name], female    DOB: 1946/03/17, 71 y.o.   MRN: [id number]  DOS:  08/30/2016 Type of visit - description : CPX Interval history: Good compliance w/ medication, BPs within normal when checked.   Review of Systems Constitutional: No fever. No chills. No unexplained wt changes. No unusual sweats  HEENT: No dental problems, no ear discharge, no facial swelling, no voice changes. No eye discharge, no eye  redness , no  intolerance to light   Respiratory: No wheezing , no  difficulty breathing. No cough , no mucus production  Cardiovascular: No CP, no leg swelling , no  Palpitations  GI: no nausea, no vomiting, no diarrhea , no  abdominal pain.  No blood in the stools. No dysphagia, no odynophagia    Endocrine: No polyphagia, no polyuria , no polydipsia  GU: No dysuria, gross hematuria, difficulty urinating. No urinary urgency, no frequency.  Musculoskeletal: Has chronic pain, under pain management  Skin: No change in the color of the skin, palor , no  Rash  Allergic, immunologic: No environmental allergies , no  food allergies  Neurological: No dizziness no  syncope. No headaches. No diplopia, no slurred, no slurred speech, no motor deficits, no facial  Numbness  Hematological: No enlarged lymph nodes, no easy bruising , no unusual bleedings  Psychiatry: No suicidal ideas, no hallucinations, no beavior problems, no confusion.  Good compliance with medications, husband has prostate cancer, she is "adjusting" to her husband's illness and doing okay.   Past Medical History:  Diagnosis Date  . Allergy    RHINITIS  . Chronic back pain   . Depression   . DJD (degenerative joint disease)    back pain , on disability  . Hyperlipidemia   . Hypertension   . Menopause    onset age 89   . TMJ PAIN 05/17/2010   Qualifier: Diagnosis of  By: Larose Kells MD, [place] [facility]     Past Surgical History:  Procedure Laterality Date  . G3 P3       Social  History   Social History  . Marital status: Married    Spouse name: N/A  . Number of children: 3  . Years of education: N/A   Occupational History  . ON DISABILITY    Social History Main Topics  . Smoking status: Never Smoker  . Smokeless tobacco: Never Used  . Alcohol use No  . Drug use: No  . Sexual activity: No   Other Topics Concern  . Not on file   Social History Narrative   Married, 3 kids, 6 G-kids    Household-- pt, husband, son   Family History  Problem Relation Age of Onset  . Cancer Sister     ??CERVICAL  . Cancer Brother     STOMACH  . Breast cancer Sister     sister dx age 39, aunt, cousin x 2   . Colon cancer Brother     dx age ~ 29 ?  [name] Diabetes      aunt   . Coronary artery disease Neg Hx   . CAD Neg Hx       Allergies as of 08/30/2016      Reactions   Simvastatin    REACTION: cramps      Medication List       Accurate as of 08/30/16 11:59 PM. Always use your most recent med list.  aspirin EC 81 MG tablet Take 81 mg by mouth daily.   atorvastatin 10 MG tablet Commonly known as:  LIPITOR Take 1 tablet (10 mg total) by mouth at bedtime.   cyclobenzaprine 10 MG tablet Commonly known as:  FLEXERIL Take 1 tablet (10 mg total) by mouth 2 (two) times daily as needed for muscle spasms.   gabapentin 100 MG capsule Commonly known as:  NEURONTIN Take 1 capsule (100 mg total) by mouth 3 (three) times daily.   hydrochlorothiazide 25 MG tablet Commonly known as:  HYDRODIURIL Take 1 tablet (25 mg total) by mouth daily.   HYDROcodone-acetaminophen 5-325 MG tablet Commonly known as:  NORCO/VICODIN Take 1 tablet by mouth every 6 (six) hours as needed for severe pain.   HYDROcodone-acetaminophen 5-325 MG tablet Commonly known as:  NORCO/VICODIN Take 1 tablet by mouth every 6 (six) hours as needed for severe pain.   HYDROcodone-acetaminophen 5-325 MG tablet Commonly known as:  NORCO/VICODIN Take 1 tablet by mouth every 6 (six) hours  as needed for severe pain.   lisinopril 10 MG tablet Commonly known as:  PRINIVIL,ZESTRIL Take 1 tablet (10 mg total) by mouth daily.   magnesium oxide 400 MG tablet Commonly known as:  MAG-OX Take 1 tablet (400 mg total) by mouth daily.   sertraline 50 MG tablet Commonly known as:  ZOLOFT Take 1.5 tablets (75 mg total) by mouth daily.   Vitamin D 2000 units tablet Take 2,000 Units by mouth daily.          Objective:   Physical Exam BP 124/68 (BP Location: Left Arm, Patient Position: Sitting, Cuff Size: Normal)   Pulse 65   Temp 97.5 F (36.4 C) (Oral)   Resp 14   Ht 5\' 4"  (1.626 m)   Wt 247 lb (112 kg)   SpO2 97%   BMI 42.40 kg/m   General:   Well developed, well nourished . NAD.  Neck: No  thyromegaly  HEENT:  Normocephalic . Face symmetric, atraumatic Lungs:  CTA B Normal respiratory effort, no intercostal retractions, no accessory muscle use. Heart: RRR,  no murmur.  No pretibial edema bilaterally  Abdomen:  Not distended, soft, non-tender. No rebound or rigidity.   Skin: Exposed areas without rash. Not pale. Not jaundice Neurologic:  alert & oriented X3.  Speech normal, gait slightly limited by chronic kind knee pain Strength symmetric and appropriate for age.  Psych: Cognition and judgment appear intact.  Cooperative with normal attention span and concentration.  Behavior appropriate. No anxious or depressed appearing.    Assessment & Plan:   Assessment HTN Hyperlipidemia Depression Morbid obesity MSK: DJD, chronic back-knee pain, hydrocodone rx by DR [place]; HTN: On lisinopril, HCTZ. BP today is very good, ambulatory BPs reportedly okay when checked. No change. Checking labs Hyperlipidemia: On Lipitor, check labs Depression: Currently controlled on Zoloft. Chronic back and knee pain: Under pain management, parking permit signed. RTC one year

## 2016-08-31 ENCOUNTER — Ambulatory Visit: Payer: Commercial Managed Care - HMO | Admitting: Pain Medicine

## 2016-08-31 DIAGNOSIS — Z09 Encounter for follow-up examination after completed treatment for conditions other than malignant neoplasm: Secondary | ICD-10-CM | POA: Insufficient documentation

## 2016-08-31 NOTE — Assessment & Plan Note (Signed)
HTN: On lisinopril, HCTZ. BP today is very good, ambulatory BPs reportedly okay when checked. No change. Checking labs Hyperlipidemia: On Lipitor, check labs Depression: Currently controlled on Zoloft. Chronic back and knee pain: Under pain management, parking permit signed. RTC one year

## 2016-09-12 ENCOUNTER — Ambulatory Visit: Payer: Medicare HMO | Attending: Pain Medicine | Admitting: Pain Medicine

## 2016-09-12 ENCOUNTER — Encounter: Payer: Self-pay | Admitting: Pain Medicine

## 2016-09-12 VITALS — BP 127/77 | Temp 97.9°F | Resp 16 | Ht 64.0 in | Wt 245.0 lb

## 2016-09-12 DIAGNOSIS — M792 Neuralgia and neuritis, unspecified: Secondary | ICD-10-CM

## 2016-09-12 DIAGNOSIS — M5442 Lumbago with sciatica, left side: Secondary | ICD-10-CM

## 2016-09-12 DIAGNOSIS — M5416 Radiculopathy, lumbar region: Secondary | ICD-10-CM | POA: Insufficient documentation

## 2016-09-12 DIAGNOSIS — M7918 Myalgia, other site: Secondary | ICD-10-CM

## 2016-09-12 DIAGNOSIS — F1190 Opioid use, unspecified, uncomplicated: Secondary | ICD-10-CM | POA: Diagnosis not present

## 2016-09-12 DIAGNOSIS — M1288 Other specific arthropathies, not elsewhere classified, other specified site: Secondary | ICD-10-CM | POA: Diagnosis not present

## 2016-09-12 DIAGNOSIS — M791 Myalgia: Secondary | ICD-10-CM | POA: Diagnosis not present

## 2016-09-12 DIAGNOSIS — M5410 Radiculopathy, site unspecified: Secondary | ICD-10-CM

## 2016-09-12 DIAGNOSIS — G8929 Other chronic pain: Secondary | ICD-10-CM

## 2016-09-12 DIAGNOSIS — M48061 Spinal stenosis, lumbar region without neurogenic claudication: Secondary | ICD-10-CM

## 2016-09-12 DIAGNOSIS — Z7982 Long term (current) use of aspirin: Secondary | ICD-10-CM | POA: Diagnosis not present

## 2016-09-12 DIAGNOSIS — M9983 Other biomechanical lesions of lumbar region: Secondary | ICD-10-CM | POA: Diagnosis not present

## 2016-09-12 DIAGNOSIS — M797 Fibromyalgia: Secondary | ICD-10-CM | POA: Insufficient documentation

## 2016-09-12 DIAGNOSIS — Z79891 Long term (current) use of opiate analgesic: Secondary | ICD-10-CM | POA: Diagnosis not present

## 2016-09-12 DIAGNOSIS — M47816 Spondylosis without myelopathy or radiculopathy, lumbar region: Secondary | ICD-10-CM

## 2016-09-12 DIAGNOSIS — Z79899 Other long term (current) drug therapy: Secondary | ICD-10-CM | POA: Diagnosis not present

## 2016-09-12 DIAGNOSIS — M170 Bilateral primary osteoarthritis of knee: Secondary | ICD-10-CM | POA: Diagnosis not present

## 2016-09-12 DIAGNOSIS — G894 Chronic pain syndrome: Secondary | ICD-10-CM | POA: Diagnosis not present

## 2016-09-12 DIAGNOSIS — Z5181 Encounter for therapeutic drug level monitoring: Secondary | ICD-10-CM | POA: Diagnosis not present

## 2016-09-12 MED ORDER — HYDROCODONE-ACETAMINOPHEN 5-325 MG PO TABS: 1.0000 | ORAL_TABLET | Freq: Four times a day (QID) | ORAL | 0 refills | Status: DC | PRN

## 2016-09-12 MED ORDER — GABAPENTIN 100 MG PO CAPS: 100.0000 mg | ORAL_CAPSULE | Freq: Three times a day (TID) | ORAL | 2 refills | Status: DC

## 2016-09-12 MED ORDER — CYCLOBENZAPRINE HCL 10 MG PO TABS: 10.0000 mg | ORAL_TABLET | Freq: Two times a day (BID) | ORAL | 2 refills | Status: DC | PRN

## 2016-09-12 MED ORDER — MAGNESIUM OXIDE 400 MG PO TABS: 400.0000 mg | ORAL_TABLET | Freq: Every day | ORAL | 2 refills | Status: DC

## 2016-09-12 NOTE — Progress Notes (Signed)
Nursing Pain Medication Assessment:  Safety precautions to be maintained throughout the outpatient stay will include: orient to surroundings, keep bed in low position, maintain call bell within reach at all times, provide assistance with transfer out of bed and ambulation.  Medication Inspection Compliance: Pill count conducted under aseptic conditions, in front of the patient. Neither the pills nor the bottle was removed from the patient's sight at any time. Once count was completed pills were immediately returned to the patient in their original bottle.  Medication: Hydrocodone/APAP Pill/Patch Count: 76 of 120 pills remain Bottle Appearance: Standard pharmacy container. Clearly labeled. Filled Date: 02 / 09 / 2018 Last Medication intake:  Today

## 2016-09-12 NOTE — Progress Notes (Signed)
Patient's Name: Wanda [name]  MRN: [id number]  Referring Provider: Colon Branch, MD  DOB: Dec 07, 1945  PCP: Colon Branch, MD  DOS: 09/12/2016  Note by: Kathlen Brunswick. Dossie Arbour, MD  Service setting: Ambulatory outpatient  Specialty: Interventional Pain Management  Location: [facility] (AMB) Pain Management Facility    Patient type: Established   Primary Reason(s) for Visit: Encounter for prescription drug management (Level of risk: moderate) CC: Back Pain (lower)  HPI  Wanda [name] is a 71 y.o. year old, female patient, who comes today for a medication management evaluation. 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 Her primarily concern today is the Back Pain (lower)  Pain Assessment: Self-Reported Pain Score: 6 /10 Clinically the patient looks like a 2/10 Reported level is inconsistent with clinical observations. Information on  the proper use of the pain scale provided to the patient today Pain Type: Chronic pain Pain Location: Back Pain Orientation: Lower Pain Descriptors / Indicators: Aching, Constant Pain Frequency: Constant  Wanda [name] was last seen on 06/01/2016 for medication management. During today's appointment we reviewed Wanda [name]'s chronic pain status, as well as her outpatient medication regimen. The patient was unable to keep her appointment for the knee injection due to the fact that she is taking care of her husband apparently has cancer.  The patient  reports that she does not use drugs. Her body mass index is 42.05 kg/m.  Further details on both, my assessment(s), as well as the proposed treatment plan, please see below.  Controlled Substance Pharmacotherapy Assessment REMS (Risk Evaluation and Mitigation Strategy)  Analgesic:Hydrocodone/APAP 5/325 one every 6 hours (20 mg/day of hydrocodone) MME/day:20 mg/day PEvon Slack RN  09/12/2016 10:45 AM  Sign at close encounter Nursing Pain Medication Assessment:  Safety precautions to be maintained throughout the outpatient stay will include: orient to surroundings, keep bed in low position, maintain call bell within reach at all times, provide assistance with transfer out of bed and ambulation.  Medication Inspection Compliance: Pill count conducted under aseptic conditions, in front of the patient. Neither the pills nor the bottle was removed from the patient's sight at any time. Once count was completed pills were immediately returned to the patient in their original bottle.  Medication: Hydrocodone/APAP Pill/Patch Count: 76 of 120 pills remain Bottle Appearance: Standard pharmacy container. Clearly labeled. Filled Date: 02 / 09 / 2018 Last Medication intake:  Today   Pharmacokinetics: Liberation and absorption (onset of action): WNL Distribution (time to peak effect): WNL Metabolism and excretion (duration of action): WNL          Pharmacodynamics: Desired effects: Analgesia: Ms. TAmmonreports >50% benefit. Functional ability:  Patient reports that medication allows her to accomplish basic ADLs Clinically meaningful improvement in function (CMIF): Sustained CMIF goals met Perceived effectiveness: Described as relatively effective, allowing for increase in activities of daily living (ADL) Undesirable effects: Side-effects or Adverse reactions: None reported Monitoring: [place]: Online review of the past 19-monthperiod conducted. Compliant with practice rules and regulations List of all UDS test(s) done:  Lab Results  Component Value Date   TOXASSSELUR FINAL 12/07/2015   TOXASSSELUR FINAL 08/18/2015   TOXASSSELUR FINAL 05/20/2015   Last UDS on record: ToxAssure Select 13  Date Value Ref Range Status  12/07/2015 FINAL  Final    Comment:    ==================================================================== TOXASSURE SELECT 13 (MW) ==================================================================== Test                             Result       Flag       Units Drug Present and Declared for Prescription Verification   Hydrocodone                    331          EXPECTED   ng/mg creat   Hydromorphone                  92           EXPECTED   ng/mg creat   Dihydrocodeine                 81           EXPECTED   ng/mg creat   Norhydrocodone                 600          EXPECTED   ng/mg creat    Sources of hydrocodone include scheduled prescription    medications. Hydromorphone, dihydrocodeine and norhydrocodone are    expected metabolites of hydrocodone. Hydromorphone and    dihydrocodeine are also available as scheduled prescription    medications. ==================================================================== Test                      Result    Flag   Units      Ref Range   Creatinine              216              mg/dL       >=20 ==================================================================== Declared Medications:  The flagging and interpretation on this report are based on the  following declared medications.  Unexpected results may arise from  inaccuracies in the declared medications.  **Note: The testing scope of this panel includes these medications:  Hydrocodone (Norco)  **Note: The testing scope of this panel does not include following  reported medications:  Acetaminophen (Norco)  Acyclovir (Zovirax)  Aspirin (Aspirin 81)  Atorvastatin (Lipitor)  Cyclobenzaprine (Flexeril)  Gabapentin (Neurontin)  Hydrochlorothiazide (Hydrodiuril)  Lisinopril (Prinivil)  Magnesium (Mag-Ox)  Sertraline (Zoloft)  Vitamin D ==================================================================== For clinical consultation, please call ([phone] ====================================================================    UDS interpretation: Compliant          Medication Assessment Form: Reviewed. Patient indicates being compliant with therapy Treatment compliance: Compliant Risk Assessment Profile: Aberrant behavior: See prior evaluations. None observed or detected today Comorbid factors increasing risk of overdose: See prior notes. No additional risks detected today Risk of substance use disorder (SUD): Low Opioid Risk Tool (ORT)  Total Score: 1  Interpretation Table:  Score <3 = Low Risk for SUD  Score between 4-7 = Moderate Risk for SUD  Score >8 = High Risk for Opioid Abuse   Risk Mitigation Strategies:  Patient Counseling: Covered Patient-Prescriber Agreement (PPA): Present and active  Notification to other healthcare providers: Done  Pharmacologic Plan: No change in therapy, at this time  Laboratory Chemistry  Inflammation Markers Lab Results  Component Value Date   ESRSEDRATE 28 08/18/2015   CRP <0.5 08/18/2015   Renal Function Lab Results  Component Value Date   BUN 10 08/30/2016    CREATININE 0.65 08/30/2016   GFRAA >60 08/18/2015   GFRNONAA >60 08/18/2015   Hepatic Function Lab Results  Component Value Date   AST 19 08/30/2016   ALT 19 08/30/2016   ALBUMIN 4.2 08/30/2016   Electrolytes Lab Results  Component Value Date   NA 136 08/30/2016   K 4.2 08/30/2016   CL 102 08/30/2016   CALCIUM 9.8 08/30/2016   MG 2.0 08/18/2015   Pain Modulating Vitamins Lab Results  Component Value Date   VD25OH 37 05/01/2011   VD125OH2TOT 58 02/01/2015   [id number] 58 02/01/2015   [id number] <8 02/01/2015   Coagulation Parameters Lab Results  Component Value Date   PLT 176.0 08/30/2016   Cardiovascular Lab Results  Component Value Date   HGB 13.8 08/30/2016   HCT 40.9 08/30/2016   Note: Lab results reviewed.  Recent Diagnostic Imaging Review  Mm Digital Diagnostic Unilat L  Result Date: 08/07/2016 CLINICAL DATA:  Followup left breast probably benign calcifications. EXAM: 2D DIGITAL DIAGNOSTIC UNILATERAL LEFT MAMMOGRAM WITH CAD AND ADJUNCT TOMO COMPARISON:  Previous exam(s). ACR Breast Density Category c: The breast tissue is heterogeneously dense, which may obscure small masses. FINDINGS: The previously described probably benign calcifications in the upper-outer left breast are unchanged. No interval findings suspicious for malignancy. Mammographic images were processed with CAD. IMPRESSION: Stable left breast probably benign calcifications. RECOMMENDATION: Bilateral diagnostic mammogram in 4 months. That will be 1 year since mammographic evaluation of the right breast. I have discussed the findings and recommendations with the patient. Results were also provided in writing at the conclusion of the visit. If applicable, a reminder letter will be sent to the patient regarding the next appointment. BI-RADS CATEGORY  3: Probably benign. Electronically Signed   By: Claudie Revering M.D.   On: 08/07/2016 12:22   Note: Imaging results reviewed.          Meds  The patient has a  current medication list which includes the following prescription(s): aspirin ec, atorvastatin, vitamin d, cyclobenzaprine, gabapentin, hydrochlorothiazide, hydrocodone-acetaminophen, hydrocodone-acetaminophen, hydrocodone-acetaminophen, lisinopril, magnesium oxide, and sertraline.  Current Outpatient Prescriptions on File Prior to Visit  Medication Sig  . aspirin EC 81 MG tablet Take 81 mg by mouth daily.  [name] atorvastatin (LIPITOR) 10 MG tablet Take 1 tablet (10 mg total) by mouth at bedtime.  . Cholecalciferol (VITAMIN D) 2000 UNITS tablet Take 2,000 Units by mouth daily.  . hydrochlorothiazide (HYDRODIURIL) 25 MG tablet Take 1 tablet (25 mg total) by mouth daily.  [name] lisinopril (PRINIVIL,ZESTRIL) 10 MG tablet Take 1 tablet (10 mg total) by mouth daily.  . sertraline (ZOLOFT) 50 MG tablet Take 1.5 tablets (75 mg total) by mouth daily.   No current facility-administered medications on file prior to visit.    ROS  Constitutional: Denies any fever or chills Gastrointestinal: No reported hemesis, hematochezia, vomiting, or acute GI distress Musculoskeletal: Denies any acute onset joint  swelling, redness, loss of ROM, or weakness Neurological: No reported episodes of acute onset apraxia, aphasia, dysarthria, agnosia, amnesia, paralysis, loss of coordination, or loss of consciousness  Allergies  Ms. Tingley is allergic to simvastatin.  [place]  Drug: Ms. Culley  reports that she does not use drugs. Alcohol:  reports that she does not drink alcohol. Tobacco:  reports that she has never smoked. She has never used smokeless tobacco. Medical:  has a past medical history of Allergy; Chronic back pain; Depression; DJD (degenerative joint disease); Hyperlipidemia; Hypertension; Menopause; and TMJ PAIN (05/17/2010). Family: family history includes Breast cancer in her sister; Cancer in her brother and sister; Colon cancer in her brother.  Past Surgical History:  Procedure Laterality Date  . G3 P3       Constitutional Exam  General appearance: Well nourished, well developed, and well hydrated. In no apparent acute distress Vitals:   09/12/16 1037  BP: 127/77  Resp: 16  Temp: 97.9 F (36.6 C)  TempSrc: Oral  SpO2: 100%  Weight: 245 lb (111.1 kg)  Height: _0  (1.626 m)   BMI Assessment: Estimated body mass index is 42.05 kg/m as calculated from the following:   Height as of this encounter: _1  (1.626 m).   Weight as of this encounter: 245 lb (111.1 kg).  BMI interpretation table: BMI level Category Range association with higher incidence of chronic pain  <18 kg/m2 Underweight   18.5-24.9 kg/m2 Ideal body weight   25-29.9 kg/m2 Overweight Increased incidence by 20%  30-34.9 kg/m2 Obese (Class I) Increased incidence by 68%  35-39.9 kg/m2 Severe obesity (Class II) Increased incidence by 136%  >40 kg/m2 Extreme obesity (Class III) Increased incidence by 254%   BMI Readings from Last 4 Encounters:  09/12/16 42.05 kg/m  08/30/16 42.40 kg/m  06/01/16 42.40 kg/m  03/21/16 42.51 kg/m   Wt Readings from Last 4 Encounters:  09/12/16 245 lb (111.1 kg)  08/30/16 247 lb (112 kg)  06/01/16 247 lb (112 kg)  03/21/16 240 lb (108.9 kg)  Psych/Mental status: Alert, oriented x 3 (person, place, & time)       Eyes: PERLA Respiratory: No evidence of acute respiratory distress  Cervical Spine Exam  Inspection: No masses, redness, or swelling Alignment: Symmetrical Functional ROM: Unrestricted ROM Stability: No instability detected Muscle strength & Tone: Functionally intact Sensory: Unimpaired Palpation: Non-contributory  Upper Extremity (UE) Exam    Side: Right upper extremity  Side: Left upper extremity  Inspection: No masses, redness, swelling, or asymmetry. No contractures  Inspection: No masses, redness, swelling, or asymmetry. No contractures  Functional ROM: Unrestricted ROM          Functional ROM: Unrestricted ROM          Muscle strength & Tone: Functionally  intact  Muscle strength & Tone: Functionally intact  Sensory: Unimpaired  Sensory: Unimpaired  Palpation: Euthermic  Palpation: Euthermic  Specialized Test(s): Deferred         Specialized Test(s): Deferred          Thoracic Spine Exam  Inspection: No masses, redness, or swelling Alignment: Symmetrical Functional ROM: Unrestricted ROM Stability: No instability detected Sensory: Unimpaired Muscle strength & Tone: Functionally intact Palpation: Non-contributory  Lumbar Spine Exam  Inspection: No masses, redness, or swelling Alignment: Symmetrical Functional ROM: Unrestricted ROM Stability: No instability detected Muscle strength & Tone: Functionally intact Sensory: Unimpaired Palpation: Non-contributory Provocative Tests: Lumbar Hyperextension and rotation test: evaluation deferred today       Patrick's Maneuver: evaluation deferred  today              Gait & Posture Assessment  Ambulation: Unassisted Gait: Relatively normal for age and body habitus Posture: WNL   Lower Extremity Exam    Side: Right lower extremity  Side: Left lower extremity  Inspection: No masses, redness, swelling, or asymmetry. No contractures  Inspection: No masses, redness, swelling, or asymmetry. No contractures  Functional ROM: Unrestricted ROM          Functional ROM: Unrestricted ROM          Muscle strength & Tone: Functionally intact  Muscle strength & Tone: Functionally intact  Sensory: Unimpaired  Sensory: Unimpaired  Palpation: No palpable anomalies  Palpation: No palpable anomalies   Assessment  Primary Diagnosis & Pertinent Problem List: The primary encounter diagnosis was Chronic pain syndrome. 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  Status Diagnosis  Controlled Worsening Recurring 1. Chronic pain syndrome   2. Chronic low back pain (Bilateral) (L>R)   3. Lumbar facet syndrome (Bilateral) (R>L)   4.  chronic lumbar radicular pain (Left) (S1 dermatome)   5. Chronic radicular pain of lower extremity (Left) (S1 dermatome)   6. 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 Musculoskeletal pain   8. Neurogenic pain   9. Fibromyalgia   10. Long term current use of opiate analgesic   11. Opiate use (20 MME/Day)      Plan of Care  Pharmacotherapy (Medications Ordered): Meds ordered this encounter  Medications  . magnesium oxide (MAG-OX) 400 MG tablet    Sig: Take 1 tablet (400 mg total) by mouth daily.    Dispense:  30 tablet    Refill:  2    Do not place this medication, or any other prescription from our practice, on "Automatic Refill". Patient may have prescription filled one day early if pharmacy is closed on scheduled refill date.  . cyclobenzaprine (FLEXERIL) 10 MG tablet    Sig: Take 1 tablet (10 mg total) by mouth 2 (two) times daily as needed for muscle spasms.    Dispense:  60 tablet    Refill:  2    Do not place this medication, or any other prescription from our practice, on "Automatic Refill". Patient may have prescription filled one day early if pharmacy is closed on scheduled refill date.  . gabapentin (NEURONTIN) 100 MG capsule    Sig: Take 1 capsule (100 mg total) by mouth 3 (three) times daily.    Dispense:  90 capsule    Refill:  2    Do not place this medication, or any other prescription from our practice, on "Automatic Refill". Patient may have prescription filled one day early if pharmacy is closed on scheduled refill date.  [name] HYDROcodone-acetaminophen (NORCO/VICODIN) 5-325 MG tablet    Sig:  Take 1 tablet by mouth every 6 (six) hours as needed for severe pain.    Dispense:  120 tablet    Refill:  0    Do not place this medication, or any other prescription from our practice, on "Automatic Refill". Patient may have prescription filled one day early if  pharmacy is closed on scheduled refill date. Do not fill until: 09/17/16 To last until: 10/17/16  . HYDROcodone-acetaminophen (NORCO/VICODIN) 5-325 MG tablet    Sig: Take 1 tablet by mouth every 6 (six) hours as needed for severe pain.    Dispense:  120 tablet    Refill:  0    Do not place this medication, or any other prescription from our practice, on "Automatic Refill". Patient may have prescription filled one day early if pharmacy is closed on scheduled refill date. Do not fill until: 10/17/16 To last until: 11/16/16  . HYDROcodone-acetaminophen (NORCO/VICODIN) 5-325 MG tablet    Sig: Take 1 tablet by mouth every 6 (six) hours as needed for severe pain.    Dispense:  120 tablet    Refill:  0    Do not place this medication, or any other prescription from our practice, on "Automatic Refill". Patient may have prescription filled one day early if pharmacy is closed on scheduled refill date. Do not fill until: 11/16/16 To last until: 12/16/16   New Prescriptions   No medications on file   Medications administered today: Ms. Poon had no medications administered during this visit. Lab-work, procedure(s), and/or referral(s): Orders Placed This Encounter  Procedures  . Lumbar Epidural Injection  . LUMBAR FACET(MEDIAL BRANCH NERVE BLOCK) MBNB  . ToxASSURE Select 13 (MW), Urine   Imaging and/or referral(s): None  Interventional therapies: Planned, scheduled, and/or pending:   Not at this time.   Considering:   Diagnostic/palliative left intra-articular knee injection.  Diagnostic bilateral lumbar facet block Left L4-5 lumbar epidural steroid injection   Palliative PRN treatment(s):   Diagnostic bilateral lumbar  facet block Left L4-5 lumbar epidural steroid injection   Provider-requested follow-up: Return in about 3 months (around 12/10/2016) for (MD) Med-Mgmt, in addition, (PRN) procedure.  Future Appointments Date Time Provider [place]  12/07/2016 10:30 AM Milinda Pointer, MD [facility] None   Primary Care Physician: Colon Branch, MD Location: [facility] Note by: Kathlen Brunswick. Dossie Arbour, M.D, DABA, DABAPM, DABPM, DABIPP, FIPP Date: 09/12/2016; Time: 12:04 PM  Pain Score Disclaimer: We use the NRS-11 scale. This is a self-reported, subjective measurement of pain severity with only modest accuracy. It is used primarily to identify changes within a particular patient. It must be understood that outpatient pain scales are significantly less accurate that those used for research, where they can be applied under ideal controlled circumstances with minimal exposure to variables. In reality, the score is likely to be a combination of pain intensity and pain affect, where pain affect describes the degree of emotional arousal or changes in action readiness caused by the sensory experience of pain. Factors such as social and work situation, setting, emotional state, anxiety levels, expectation, and prior pain experience may influence pain perception and show large inter-individual differences that may also be affected by time variables.  Patient instructions provided during this appointment: Patient Instructions   Pain Score  Introduction: The pain score used by this practice is the Verbal Numerical Rating Scale (VNRS-11). This is an 11-point scale. It is for adults and children 10 years or older. There are significant differences in how the pain score is reported, used, and applied. Forget everything you learned in the past and learn this scoring system.  General Information: The scale should reflect your current level of pain. Unless you are specifically asked for the level  of your worst pain, or your average pain. If you are asked for one of these  two, then it should be understood that it is over the past 24 hours.  Basic Activities of Daily Living (ADL): Personal hygiene, dressing, eating, transferring, and using restroom.  Instructions: Most patients tend to report their level of pain as a combination of two factors, their physical pain and their psychosocial pain. This last one is also known as "suffering" and it is reflection of how physical pain affects you socially and psychologically. From now on, report them separately. From this point on, when asked to report your pain level, report only your physical pain. Use the following table for reference.  Pain Clinic Pain Levels (0-5/10)  Pain Level Score Description  No Pain 0   Mild pain 1 Nagging, annoying, but does not interfere with basic activities of daily living (ADL). Patients are able to eat, bathe, get dressed, toileting (being able to get on and off the toilet and perform personal hygiene functions), transfer (move in and out of bed or a chair without assistance), and maintain continence (able to control bladder and bowel functions). Blood pressure and heart rate are unaffected. A normal heart rate for a healthy adult ranges from 60 to 100 bpm (beats per minute).   Mild to moderate pain 2 Noticeable and distracting. Impossible to hide from other people. More frequent flare-ups. Still possible to adapt and function close to normal. It can be very annoying and may have occasional stronger flare-ups. With discipline, patients may get used to it and adapt.   Moderate pain 3 Interferes significantly with activities of daily living (ADL). It becomes difficult to feed, bathe, get dressed, get on and off the toilet or to perform personal hygiene functions. Difficult to get in and out of bed or a chair without assistance. Very distracting. With effort, it can be ignored when deeply involved in activities.    Moderately severe pain 4 Impossible to ignore for more than a few minutes. With effort, patients may still be able to manage work or participate in some social activities. Very difficult to concentrate. Signs of autonomic nervous system discharge are evident: dilated pupils (mydriasis); mild sweating (diaphoresis); sleep interference. Heart rate becomes elevated (>115 bpm). Diastolic blood pressure (lower number) rises above 100 mmHg. Patients find relief in laying down and not moving.   Severe pain 5 Intense and extremely unpleasant. Associated with frowning face and frequent crying. Pain overwhelms the senses.  Ability to do any activity or maintain social relationships becomes significantly limited. Conversation becomes difficult. Pacing back and forth is common, as getting into a comfortable position is nearly impossible. Pain wakes you up from deep sleep. Physical signs will be obvious: pupillary dilation; increased sweating; goosebumps; brisk reflexes; cold, clammy hands and feet; nausea, vomiting or dry heaves; loss of appetite; significant sleep disturbance with inability to fall asleep or to remain asleep. When persistent, significant weight loss is observed due to the complete loss of appetite and sleep deprivation.  Blood pressure and heart rate becomes significantly elevated. Caution: If elevated blood pressure triggers a pounding headache associated with blurred vision, then the patient should immediately seek attention at an urgent or emergency care unit, as these may be signs of an impending stroke.    Emergency Department Pain Levels (6-10/10)  Emergency Room Pain 6 Severely limiting. Requires emergency care and should not be seen or managed at an outpatient pain management facility. Communication becomes difficult and requires great effort. Assistance to reach the emergency department may be required. Facial flushing and profuse sweating along with potentially  dangerous increases in heart  rate and blood pressure will be evident.   Distressing pain 7 Self-care is very difficult. Assistance is required to transport, or use restroom. Assistance to reach the emergency department will be required. Tasks requiring coordination, such as bathing and getting dressed become very difficult.   Disabling pain 8 Self-care is no longer possible. At this level, pain is disabling. The individual is unable to do even the most "basic" activities such as walking, eating, bathing, dressing, transferring to a bed, or toileting. Fine motor skills are lost. It is difficult to think clearly.   Incapacitating pain 9 Pain becomes incapacitating. Thought processing is no longer possible. Difficult to remember your own name. Control of movement and coordination are lost.   The worst pain imaginable 10 At this level, most patients pass out from pain. When this level is reached, collapse of the autonomic nervous system occurs, leading to a sudden drop in blood pressure and heart rate. This in turn results in a temporary and dramatic drop in blood flow to the brain, leading to a loss of consciousness. Fainting is one of the body's self defense mechanisms. Passing out puts the brain in a calmed state and causes it to shut down for a while, in order to begin the healing process.    Summary: 1. Refer to this scale when providing [place] with your pain level. 2. Be accurate and careful when reporting your pain level. This will help with your care. 3. Over-reporting your pain level will lead to loss of credibility. 4. Even a level of 1/10 means that there is pain and will be treated at our facility. 5. High, inaccurate reporting will be documented as "Symptom Exaggeration", leading to loss of credibility and suspicions of possible secondary gains such as obtaining more narcotics, or wanting to appear disabled, for fraudulent reasons. 6. Only pain levels of 5 or below will be seen at our facility. 7. Pain levels of 6 and  above will be sent to the Emergency Department and the appointment cancelled.  You were given 3 prescriptions for Hydrocodone today. Prescriptions for Magnesium, Cyclobenzaprine, and Gabapentin were sent to your pharmacy. Facet Blocks Patient Information  Description: The facets are joints in the spine between the vertebrae.  Like any joints in the body, facets can become irritated and painful.  Arthritis can also effect the facets.  By injecting steroids and local anesthetic in and around these joints, we can temporarily block the nerve supply to them.  Steroids act directly on irritated nerves and tissues to reduce selling and inflammation which often leads to decreased pain.  Facet blocks may be done anywhere along the spine from the neck to the low back depending upon the location of your pain.   After numbing the skin with local anesthetic (like Novocaine), a small needle is passed onto the facet joints under x-ray guidance.  You may experience a sensation of pressure while this is being done.  The entire block usually lasts about 15-25 minutes.   Conditions which may be treated by facet blocks:   Low back/buttock pain  Neck/shoulder pain  Certain types of headaches  Preparation for the injection:  1. Do not eat any solid food or dairy products within 8 hours of your appointment. 2. You may drink clear liquid up to 3 hours before appointment.  Clear liquids include water, black coffee, juice or soda.  No milk or cream please. 3. You may take your regular medication, including pain medications, with  a sip of water before your appointment.  Diabetics should hold regular insulin (if taken separately) and take 1/2 normal NPH dose the morning of the procedure.  Carry some sugar containing items with you to your appointment. 4. A driver must accompany you and be prepared to drive you home after your procedure. 5. Bring all your current medications with you. 6. An IV may be inserted and  sedation may be given at the discretion of the physician. 7. A blood pressure cuff, EKG and other monitors will often be applied during the procedure.  Some patients may need to have extra oxygen administered for a short period. 8. You will be asked to provide medical information, including your allergies and medications, prior to the procedure.  We must know immediately if you are taking blood thinners (like Coumadin/Warfarin) or if you are allergic to IV iodine contrast (dye).  We must know if you could possible be pregnant.  Possible side-effects:   Bleeding from needle site  Infection (rare, may require surgery)  Nerve injury (rare)  Numbness & tingling (temporary)  Difficulty urinating (rare, temporary)  Spinal headache (a headache worse with upright posture)  Light-headedness (temporary)  Pain at injection site (serveral days)  Decreased blood pressure (rare, temporary)  Weakness in arm/leg (temporary)  Pressure sensation in back/neck (temporary)   Call if you experience:   Fever/chills associated with headache or increased back/neck pain  Headache worsened by an upright position  New onset, weakness or numbness of an extremity below the injection site  Hives or difficulty breathing (go to the emergency room)  Inflammation or drainage at the injection site(s)  Severe back/neck pain greater than usual  New symptoms which are concerning to you  Please note:  Although the local anesthetic injected can often make your back or neck feel good for several hours after the injection, the pain will likely return. It takes 3-7 days for steroids to work.  You may not notice any pain relief for at least one week.  If effective, we will often do a series of 2-3 injections spaced 3-6 weeks apart to maximally decrease your pain.  After the initial series, you may be a candidate for a more permanent nerve block of the facets.  If you have any questions, please call #[phone])  [facility] _____________________________________________________________________________________________

## 2016-09-12 NOTE — Patient Instructions (Addendum)
Pain Score  Introduction: The pain score used by this practice is the Verbal Numerical Rating Scale (VNRS-11). This is an 11-point scale. It is for adults and children 10 years or older. There are significant differences in how the pain score is reported, used, and applied. Forget everything you learned in the past and learn this scoring system.  General Information: The scale should reflect your current level of pain. Unless you are specifically asked for the level of your worst pain, or your average pain. If you are asked for one of these two, then it should be understood that it is over the past 24 hours.  Basic Activities of Daily Living (ADL): Personal hygiene, dressing, eating, transferring, and using restroom.  Instructions: Most patients tend to report their level of pain as a combination of two factors, their physical pain and their psychosocial pain. This last one is also known as "suffering" and it is reflection of how physical pain affects you socially and psychologically. From now on, report them separately. From this point on, when asked to report your pain level, report only your physical pain. Use the following table for reference.  Pain Clinic Pain Levels (0-5/10)  Pain Level Score Description  No Pain 0   Mild pain 1 Nagging, annoying, but does not interfere with basic activities of daily living (ADL). Patients are able to eat, bathe, get dressed, toileting (being able to get on and off the toilet and perform personal hygiene functions), transfer (move in and out of bed or a chair without assistance), and maintain continence (able to control bladder and bowel functions). Blood pressure and heart rate are unaffected. A normal heart rate for a healthy adult ranges from 60 to 100 bpm (beats per minute).   Mild to moderate pain 2 Noticeable and distracting. Impossible to hide from other people. More frequent flare-ups. Still possible to adapt and function close to normal. It can be very  annoying and may have occasional stronger flare-ups. With discipline, patients may get used to it and adapt.   Moderate pain 3 Interferes significantly with activities of daily living (ADL). It becomes difficult to feed, bathe, get dressed, get on and off the toilet or to perform personal hygiene functions. Difficult to get in and out of bed or a chair without assistance. Very distracting. With effort, it can be ignored when deeply involved in activities.   Moderately severe pain 4 Impossible to ignore for more than a few minutes. With effort, patients may still be able to manage work or participate in some social activities. Very difficult to concentrate. Signs of autonomic nervous system discharge are evident: dilated pupils (mydriasis); mild sweating (diaphoresis); sleep interference. Heart rate becomes elevated (>115 bpm). Diastolic blood pressure (lower number) rises above 100 mmHg. Patients find relief in laying down and not moving.   Severe pain 5 Intense and extremely unpleasant. Associated with frowning face and frequent crying. Pain overwhelms the senses.  Ability to do any activity or maintain social relationships becomes significantly limited. Conversation becomes difficult. Pacing back and forth is common, as getting into a comfortable position is nearly impossible. Pain wakes you up from deep sleep. Physical signs will be obvious: pupillary dilation; increased sweating; goosebumps; brisk reflexes; cold, clammy hands and feet; nausea, vomiting or dry heaves; loss of appetite; significant sleep disturbance with inability to fall asleep or to remain asleep. When persistent, significant weight loss is observed due to the complete loss of appetite and sleep deprivation.  Blood pressure and heart   rate becomes significantly elevated. Caution: If elevated blood pressure triggers a pounding headache associated with blurred vision, then the patient should immediately seek attention at an urgent or  emergency care unit, as these may be signs of an impending stroke.    Emergency Department Pain Levels (6-10/10)  Emergency Room Pain 6 Severely limiting. Requires emergency care and should not be seen or managed at an outpatient pain management facility. Communication becomes difficult and requires great effort. Assistance to reach the emergency department may be required. Facial flushing and profuse sweating along with potentially dangerous increases in heart rate and blood pressure will be evident.   Distressing pain 7 Self-care is very difficult. Assistance is required to transport, or use restroom. Assistance to reach the emergency department will be required. Tasks requiring coordination, such as bathing and getting dressed become very difficult.   Disabling pain 8 Self-care is no longer possible. At this level, pain is disabling. The individual is unable to do even the most "basic" activities such as walking, eating, bathing, dressing, transferring to a bed, or toileting. Fine motor skills are lost. It is difficult to think clearly.   Incapacitating pain 9 Pain becomes incapacitating. Thought processing is no longer possible. Difficult to remember your own name. Control of movement and coordination are lost.   The worst pain imaginable 10 At this level, most patients pass out from pain. When this level is reached, collapse of the autonomic nervous system occurs, leading to a sudden drop in blood pressure and heart rate. This in turn results in a temporary and dramatic drop in blood flow to the brain, leading to a loss of consciousness. Fainting is one of the body's self defense mechanisms. Passing out puts the brain in a calmed state and causes it to shut down for a while, in order to begin the healing process.    Summary: 1. Refer to this scale when providing [place] with your pain level. 2. Be accurate and careful when reporting your pain level. This will help with your care. 3. Over-reporting  your pain level will lead to loss of credibility. 4. Even a level of 1/10 means that there is pain and will be treated at our facility. 5. High, inaccurate reporting will be documented as "Symptom Exaggeration", leading to loss of credibility and suspicions of possible secondary gains such as obtaining more narcotics, or wanting to appear disabled, for fraudulent reasons. 6. Only pain levels of 5 or below will be seen at our facility. 7. Pain levels of 6 and above will be sent to the Emergency Department and the appointment cancelled.  You were given 3 prescriptions for Hydrocodone today. Prescriptions for Magnesium, Cyclobenzaprine, and Gabapentin were sent to your pharmacy. Facet Blocks Patient Information  Description: The facets are joints in the spine between the vertebrae.  Like any joints in the body, facets can become irritated and painful.  Arthritis can also effect the facets.  By injecting steroids and local anesthetic in and around these joints, we can temporarily block the nerve supply to them.  Steroids act directly on irritated nerves and tissues to reduce selling and inflammation which often leads to decreased pain.  Facet blocks may be done anywhere along the spine from the neck to the low back depending upon the location of your pain.   After numbing the skin with local anesthetic (like Novocaine), a small needle is passed onto the facet joints under x-ray guidance.  You may experience a sensation of pressure while this is  being done.  The entire block usually lasts about 15-25 minutes.   Conditions which may be treated by facet blocks:   Low back/buttock pain  Neck/shoulder pain  Certain types of headaches  Preparation for the injection:  1. Do not eat any solid food or dairy products within 8 hours of your appointment. 2. You may drink clear liquid up to 3 hours before appointment.  Clear liquids include water, black coffee, juice or soda.  No milk or cream  please. 3. You may take your regular medication, including pain medications, with a sip of water before your appointment.  Diabetics should hold regular insulin (if taken separately) and take 1/2 normal NPH dose the morning of the procedure.  Carry some sugar containing items with you to your appointment. 4. A driver must accompany you and be prepared to drive you home after your procedure. 5. Bring all your current medications with you. 6. An IV may be inserted and sedation may be given at the discretion of the physician. 7. A blood pressure cuff, EKG and other monitors will often be applied during the procedure.  Some patients may need to have extra oxygen administered for a short period. 8. You will be asked to provide medical information, including your allergies and medications, prior to the procedure.  We must know immediately if you are taking blood thinners (like Coumadin/Warfarin) or if you are allergic to IV iodine contrast (dye).  We must know if you could possible be pregnant.  Possible side-effects:   Bleeding from needle site  Infection (rare, may require surgery)  Nerve injury (rare)  Numbness & tingling (temporary)  Difficulty urinating (rare, temporary)  Spinal headache (a headache worse with upright posture)  Light-headedness (temporary)  Pain at injection site (serveral days)  Decreased blood pressure (rare, temporary)  Weakness in arm/leg (temporary)  Pressure sensation in back/neck (temporary)   Call if you experience:   Fever/chills associated with headache or increased back/neck pain  Headache worsened by an upright position  New onset, weakness or numbness of an extremity below the injection site  Hives or difficulty breathing (go to the emergency room)  Inflammation or drainage at the injection site(s)  Severe back/neck pain greater than usual  New symptoms which are concerning to you  Please note:  Although the local anesthetic injected  can often make your back or neck feel good for several hours after the injection, the pain will likely return. It takes 3-7 days for steroids to work.  You may not notice any pain relief for at least one week.  If effective, we will often do a series of 2-3 injections spaced 3-6 weeks apart to maximally decrease your pain.  After the initial series, you may be a candidate for a more permanent nerve block of the facets.  If you have any questions, please call #[phone]) [facility] _____________________________________________________________________________________________

## 2016-09-17 ENCOUNTER — Other Ambulatory Visit: Payer: Self-pay | Admitting: Internal Medicine

## 2016-09-20 LAB — TOXASSURE SELECT 13 (MW), URINE

## 2016-10-05 ENCOUNTER — Other Ambulatory Visit: Payer: Self-pay | Admitting: Internal Medicine

## 2016-10-05 NOTE — Telephone Encounter (Signed)
Patient called to follow up on request for refill. Plse adv

## 2016-10-05 NOTE — Telephone Encounter (Signed)
Rx sent 

## 2016-10-13 ENCOUNTER — Other Ambulatory Visit: Payer: Self-pay | Admitting: Internal Medicine

## 2016-10-13 ENCOUNTER — Telehealth: Payer: Self-pay | Admitting: Internal Medicine

## 2016-10-13 MED ORDER — ATORVASTATIN CALCIUM 10 MG PO TABS: 10.0000 mg | ORAL_TABLET | Freq: Every day | ORAL | 3 refills | Status: DC

## 2016-10-13 NOTE — Telephone Encounter (Signed)
Relation to CA:REQJ Call back number:[phone] Pharmacy: [facility] [address] [place], [place] [phone] (Phone) [phone] (Fax)      Reason for call:  Patient requesting a refill atorvastatin (LIPITOR) 10 MG tablet, as per AVS patient is due for physical 1 year from 08/30/16,please advise

## 2016-10-13 NOTE — Telephone Encounter (Signed)
Rx sent 

## 2016-10-31 ENCOUNTER — Encounter: Payer: Self-pay | Admitting: Internal Medicine

## 2016-10-31 ENCOUNTER — Ambulatory Visit (INDEPENDENT_AMBULATORY_CARE_PROVIDER_SITE_OTHER): Payer: Medicare HMO | Admitting: Internal Medicine

## 2016-10-31 VITALS — BP 132/84 | HR 77 | Temp 98.4°F | Resp 14 | Ht 64.0 in | Wt 240.0 lb

## 2016-10-31 DIAGNOSIS — G8929 Other chronic pain: Secondary | ICD-10-CM

## 2016-10-31 DIAGNOSIS — F329 Major depressive disorder, single episode, unspecified: Secondary | ICD-10-CM

## 2016-10-31 DIAGNOSIS — M5442 Lumbago with sciatica, left side: Secondary | ICD-10-CM | POA: Diagnosis not present

## 2016-10-31 DIAGNOSIS — R399 Unspecified symptoms and signs involving the genitourinary system: Secondary | ICD-10-CM | POA: Diagnosis not present

## 2016-10-31 DIAGNOSIS — F32A Depression, unspecified: Secondary | ICD-10-CM

## 2016-10-31 DIAGNOSIS — F418 Other specified anxiety disorders: Secondary | ICD-10-CM | POA: Diagnosis not present

## 2016-10-31 DIAGNOSIS — F419 Anxiety disorder, unspecified: Secondary | ICD-10-CM

## 2016-10-31 LAB — POC URINALSYSI DIPSTICK (AUTOMATED)
BILIRUBIN UA: NEGATIVE
Blood, UA: NEGATIVE
Glucose, UA: NEGATIVE
KETONES UA: NEGATIVE
LEUKOCYTES UA: NEGATIVE
Nitrite, UA: NEGATIVE
PH UA: 6 (ref 5.0–8.0)
Protein, UA: NEGATIVE
Spec Grav, UA: 1.03 (ref 1.030–1.035)
Urobilinogen, UA: 0.2 (ref ?–2.0)

## 2016-10-31 MED ORDER — ZOLPIDEM TARTRATE 5 MG PO TABS: 5.0000 mg | ORAL_TABLET | Freq: Every evening | ORAL | 0 refills | Status: DC | PRN

## 2016-10-31 MED ORDER — CIPROFLOXACIN HCL 500 MG PO TABS: 500.0000 mg | ORAL_TABLET | Freq: Two times a day (BID) | ORAL | 0 refills | Status: DC

## 2016-10-31 MED ORDER — SERTRALINE HCL 50 MG PO TABS: 150.0000 mg | ORAL_TABLET | Freq: Every day | ORAL | 0 refills | Status: DC

## 2016-10-31 NOTE — Progress Notes (Signed)
Subjective:    Patient ID: Wanda [name], female    DOB: [birth year], 71 y.o.   MRN: [id number]  DOS:  10/31/2016 Type of visit - description : acute, multiple concerns Interval history: Initially, make the appointment because she thought she had a UTI but has multiple issues:  Chronic back pain: Increasing intensity in the last 3 or 4 days, pain features are the same. Pain located  @ the mid lower back, denies lower extremity paresthesias;  not responding as usual to hydrocodone.  Husband was very sick, was admitted to the hospital, a defibrillator was place and now he is back home. She is the caregiver, reports that his attitude is difficult and she "can't please him".  Also stressed because is taking care of a 23-month-old G-G- baby all day (not at night).  Finally, having urinary frequency and urgency for one week. Denies blood in the urine or actual dysuria.  Review of Systems Subjective fever some nights, No chills. Mild nausea yesterday. Denies vomiting, diarrhea, blood in the stools. BMs are regular, good po tolerance. She did admit to lower abdominal pain bilaterally. No vaginal discharge or rash.   Past Medical History:  Diagnosis Date  . Allergy    RHINITIS  . Chronic back pain   . Depression   . DJD (degenerative joint disease)    back pain , on disability  . Hyperlipidemia   . Hypertension   . Menopause    onset age 79   . 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 G3 P3       Social History   Social History  . Marital status: Married    Spouse name: N/A  . Number of children: 3  . Years of education: N/A   Occupational History  . ON DISABILITY    Social History Main Topics  . Smoking status: Never Smoker  . Smokeless tobacco: Never Used  . Alcohol use No  . Drug use: No  . Sexual activity: No   Other Topics Concern  . Not on file   Social History  Narrative   Married, 3 kids, 6 G-kids    Household-- pt, husband, son      Allergies as of 10/31/2016      Reactions   Simvastatin    REACTION: cramps      Medication List       Accurate as of 10/31/16 11:59 PM. Always use your most recent med list.          aspirin EC 81 MG tablet Take 81 mg by mouth daily.   atorvastatin 10 MG tablet Commonly known as:  LIPITOR Take 1 tablet (10 mg total) by mouth at bedtime.   ciprofloxacin 500 MG tablet Commonly known as:  CIPRO Take 1 tablet (500 mg total) by mouth 2 (two) times daily.   cyclobenzaprine 10 MG tablet Commonly known as:  FLEXERIL Take 1 tablet (10 mg total) by mouth 2 (two) times daily as needed for muscle spasms.   gabapentin 100 MG capsule Commonly known as:  NEURONTIN Take 1 capsule (100 mg total) by mouth 3 (three) times daily.   hydrochlorothiazide 25 MG tablet Commonly known as:  HYDRODIURIL Take 1 tablet (25 mg total) by mouth daily.   HYDROcodone-acetaminophen 5-325 MG tablet Commonly known as:  NORCO/VICODIN Take 1 tablet by mouth every 6 (six) hours as needed for  severe pain.   HYDROcodone-acetaminophen 5-325 MG tablet Commonly known as:  NORCO/VICODIN Take 1 tablet by mouth every 6 (six) hours as needed for severe pain.   HYDROcodone-acetaminophen 5-325 MG tablet Commonly known as:  NORCO/VICODIN Take 1 tablet by mouth every 6 (six) hours as needed for severe pain. Start taking on:  11/16/2016   lisinopril 10 MG tablet Commonly known as:  PRINIVIL,ZESTRIL Take 1 tablet (10 mg total) by mouth daily.   magnesium oxide 400 MG tablet Commonly known as:  MAG-OX Take 1 tablet (400 mg total) by mouth daily.   sertraline 50 MG tablet Commonly known as:  ZOLOFT Take 3 tablets (150 mg total) by mouth daily.   Vitamin D 2000 units tablet Take 2,000 Units by mouth daily.   zolpidem 5 MG tablet Commonly known as:  AMBIEN Take 1 tablet (5 mg total) by mouth at bedtime as needed for sleep.           Objective:   Physical Exam BP 132/84 (BP Location: Left Arm, Patient Position: Sitting, Cuff Size: Normal)   Pulse 77   Temp 98.4 F (36.9 C) (Oral)   Resp 14   Ht 5\' 4"  (1.626 m)   Wt 240 lb (108.9 kg)   SpO2 98%   BMI 41.20 kg/m  General:   Well developed, overweight appearing. Patient was tearful when we talk about her husband and great-granddaughter. HEENT:  Normocephalic . Face symmetric, atraumatic Lungs:  CTA B Normal respiratory effort, no intercostal retractions, no accessory muscle use. Heart: RRR,  no murmur.  no pretibial edema bilaterally  Abdomen:  Not distended, soft, non-tender. No rebound or rigidity. + Bowel sounds Skin: Not pale. Not jaundice MSK: Slightly TTP at the low back. Neurologic:  alert & oriented X3.  Speech normal, gait appropriate, somewhat antalgic when you ask her to get up from the examining table. Psych--  Cognition and judgment appear intact.  Cooperative with normal attention span and concentration.  Tearful    Assessment & Plan:   Assessment HTN Hyperlipidemia Depression Morbid obesity MSK: DJD, chronic back-knee pain, hydrocodone rx by DR Dossie Arbour  PLAN: UTI? Has urinary frequency and urgency, urine was cloudy but Udip otherwise okay. Will send a UA, urine culture and start Cipro empirically. Anxiety and depression: Worse, likely situational as she is taking care off her husband and the great grandbaby. Will iIncrease Zoloft from 75-150 mg. Needs help sleeping, recommend Ambien. Reassess in 4 weeks. Back pain: Increased, recommend to call her pain management physician. She c/o lower abd  pain , related to UTI ?  exam of the abdomen is benign. Definitely call if symptoms increase. RTC 4 weeks

## 2016-10-31 NOTE — Progress Notes (Signed)
Pre visit review using our clinic review tool, if applicable. No additional management support is needed unless otherwise documented below in the visit note. 

## 2016-10-31 NOTE — Patient Instructions (Signed)
Next visit in 4 weeks, please make an appointment  Start ciprofloxacin for UTI  Drink plenty of fluids  Increase sertraline: 2 tablets daily for one week, then 3 tablets daily  Take Ambien as needed only if you can sleep  Please call your pain management doctor regards the increased back pain  If you have fever, chills, severe stomach  or back pain, nausea, diarrhea, blood in the stools: call or go to the ER

## 2016-11-01 LAB — URINALYSIS, ROUTINE W REFLEX MICROSCOPIC
Bilirubin Urine: NEGATIVE
Hgb urine dipstick: NEGATIVE
Ketones, ur: NEGATIVE
LEUKOCYTES UA: NEGATIVE
Nitrite: NEGATIVE
SPECIFIC GRAVITY, URINE: 1.025 (ref 1.000–1.030)
TOTAL PROTEIN, URINE-UPE24: NEGATIVE
URINE GLUCOSE: NEGATIVE
Urobilinogen, UA: 0.2 (ref 0.0–1.0)
pH: 5 (ref 5.0–8.0)

## 2016-11-01 LAB — URINE CULTURE: ORGANISM ID, BACTERIA: NO GROWTH

## 2016-11-01 NOTE — Assessment & Plan Note (Deleted)
UTI? Has urinary frequency and urgency, urine was cloudy but Udip otherwise okay. Will send a UA, urine culture and start Cipro empirically. Anxiety and depression: Worse, likely situational as she is taking care off her husband and the great grandbaby. Will iIncrease Zoloft from 75-150 mg. Needs help sleeping, recommend Ambien. Reassess in 4 weeks. Back pain: Increased, recommend to call her pain management physician. She c/o lower abd  pain , related to UTI ?  exam of the abdomen is benign. Definitely call if symptoms increase. RTC 4 weeks

## 2016-11-01 NOTE — Assessment & Plan Note (Signed)
UTI? Has urinary frequency and urgency, urine was cloudy but Udip otherwise okay. Will send a UA, urine culture and start Cipro empirically. Anxiety and depression: Worse, likely situational as she is taking care off her husband and the great grandbaby. Will iIncrease Zoloft from 75-150 mg. Needs help sleeping, recommend Ambien. Reassess in 4 weeks. Back pain: Increased, recommend to call her pain management physician. She c/o lower abd  pain , related to UTI ?  exam of the abdomen is benign. Definitely call if symptoms increase. RTC 4 weeks

## 2016-12-04 IMAGING — CR DG HIP (WITH OR WITHOUT PELVIS) 2-3V*R*
1 series · 3 of 3 positions shown · non-contrast
Comparison: None.

CLINICAL DATA: Chronic pain

EXAM:
PELVIS AND BILATERAL HIPS:  4+ VIEWS

[Series 1: dg hip unilat w or w/o pelvis 2-3 views  · non-contrast · 0.14mm/px · 3 of 3 slices shown]
[im 1/3]
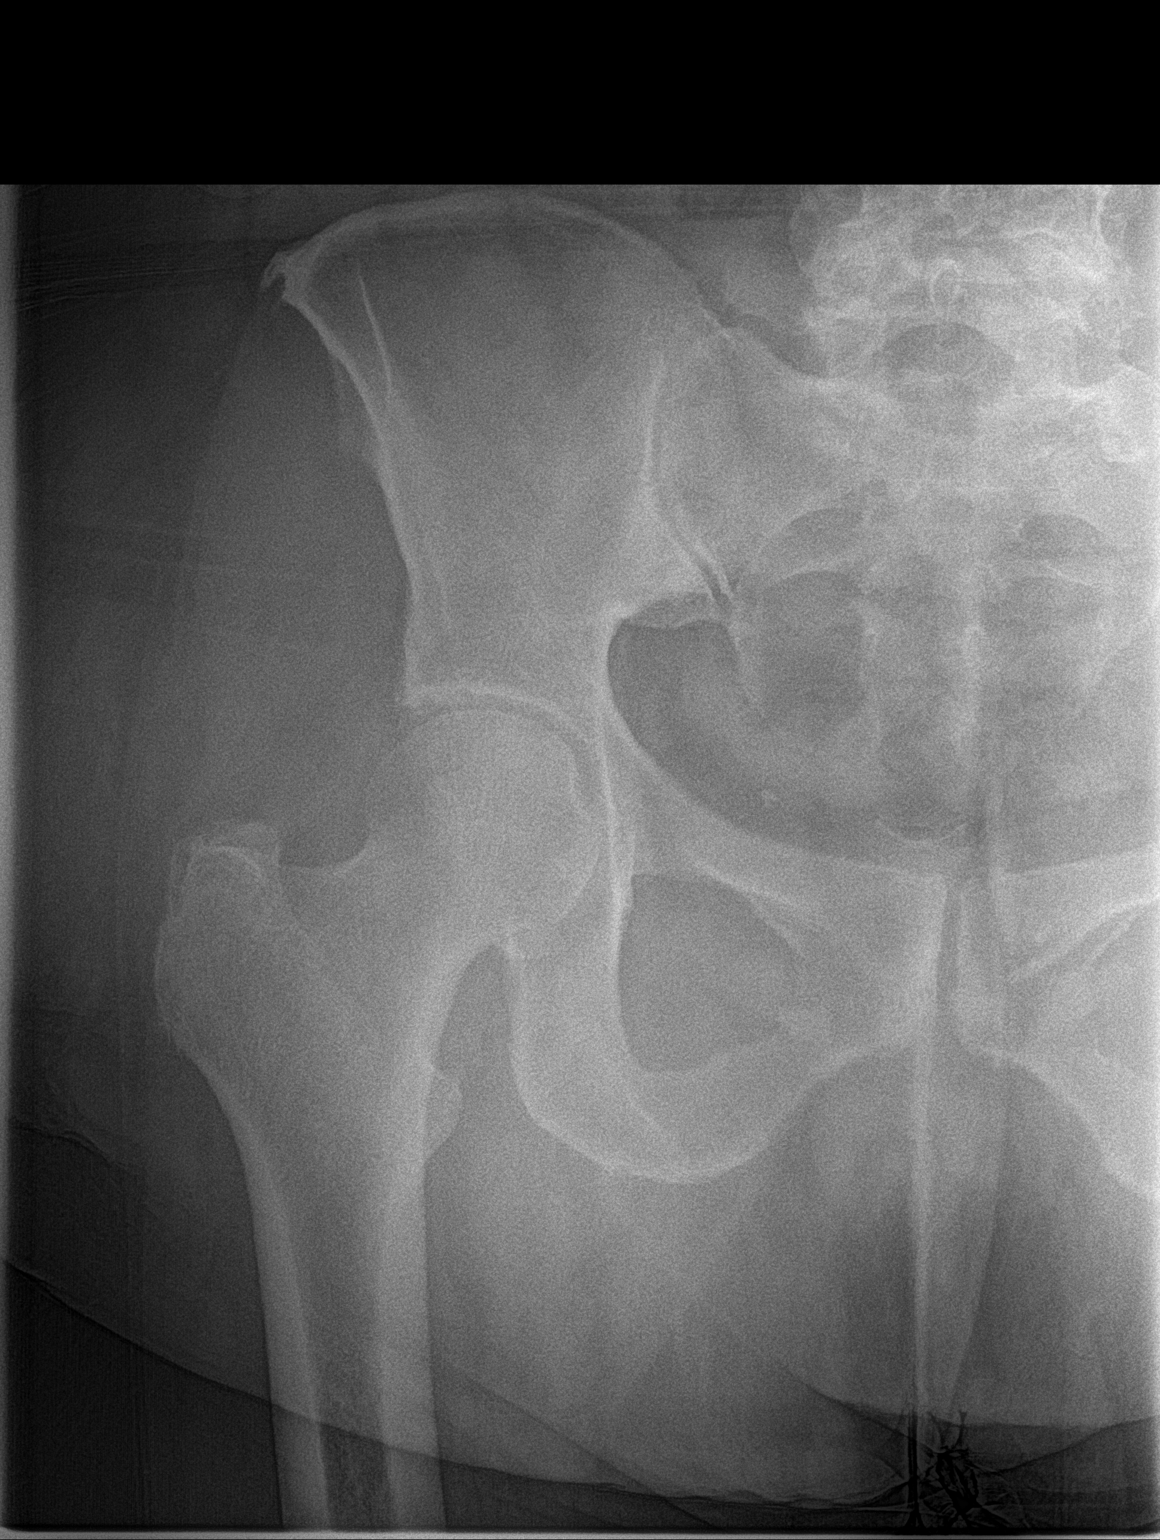
[im 2/3]
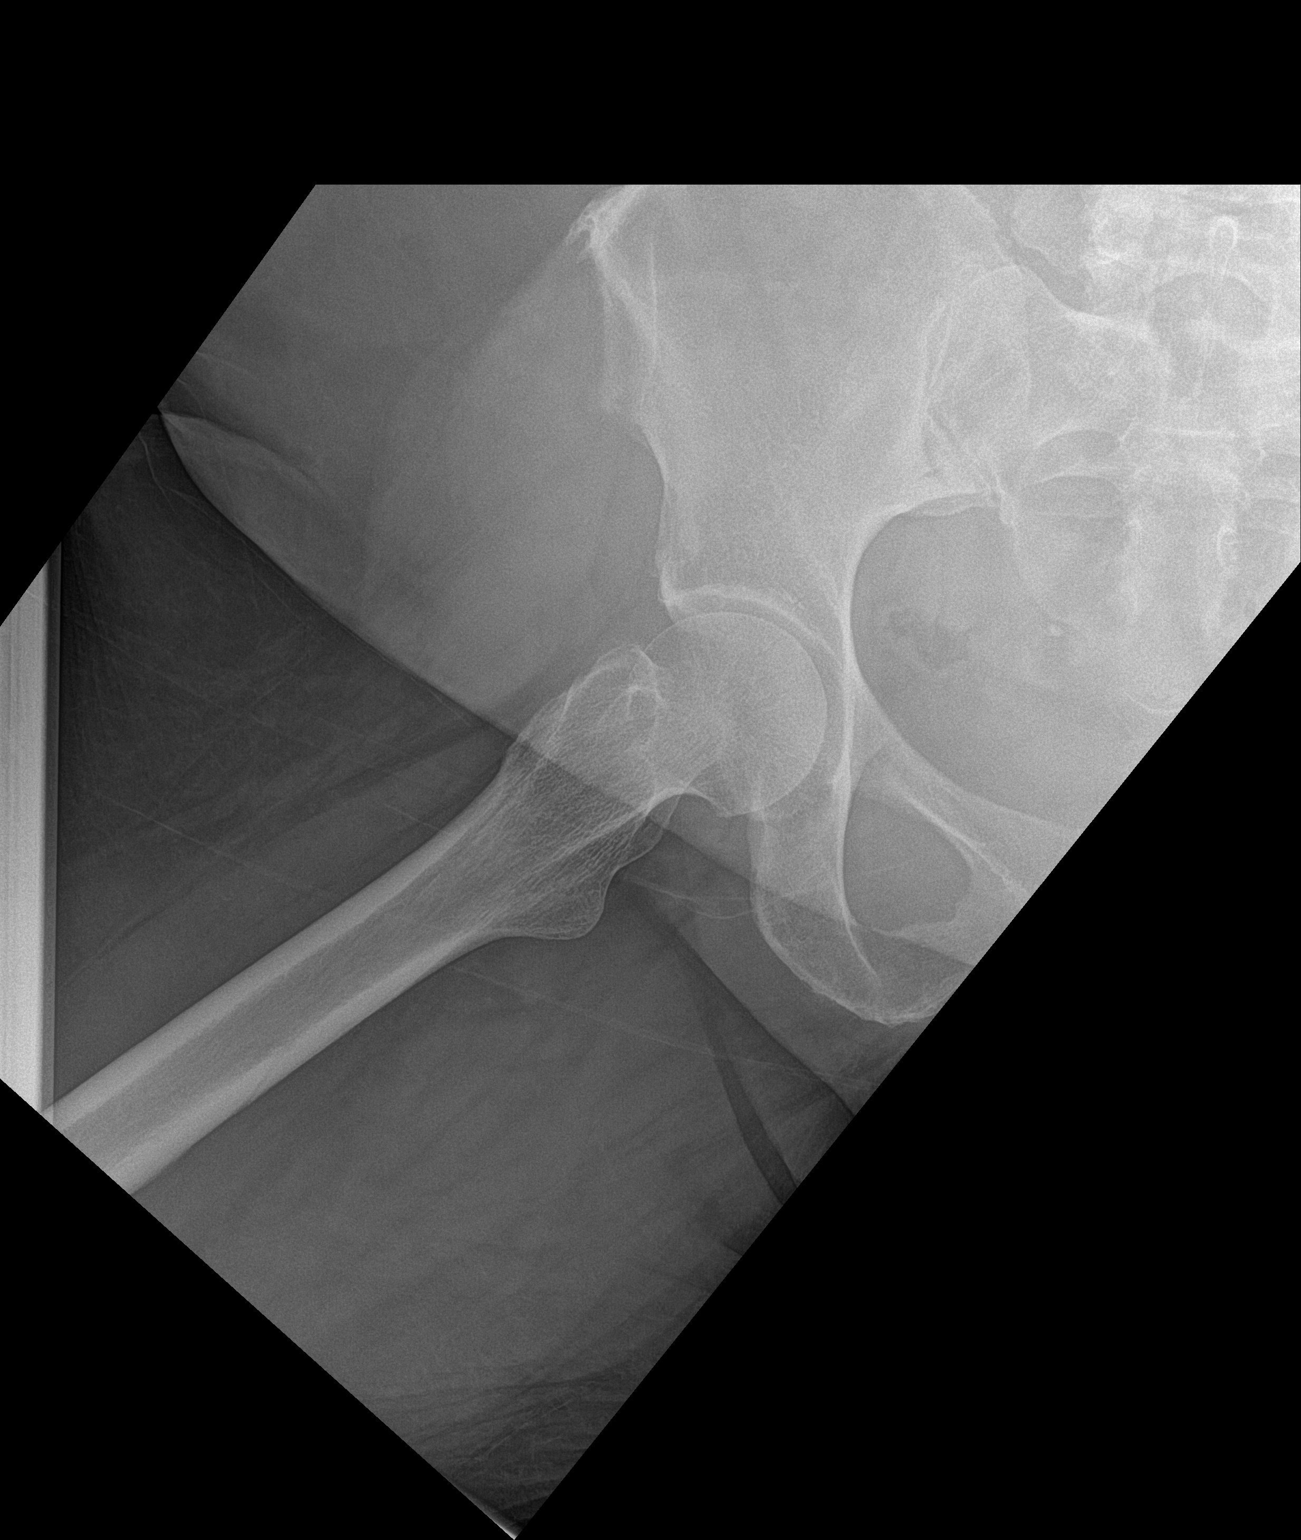
[im 3/3]
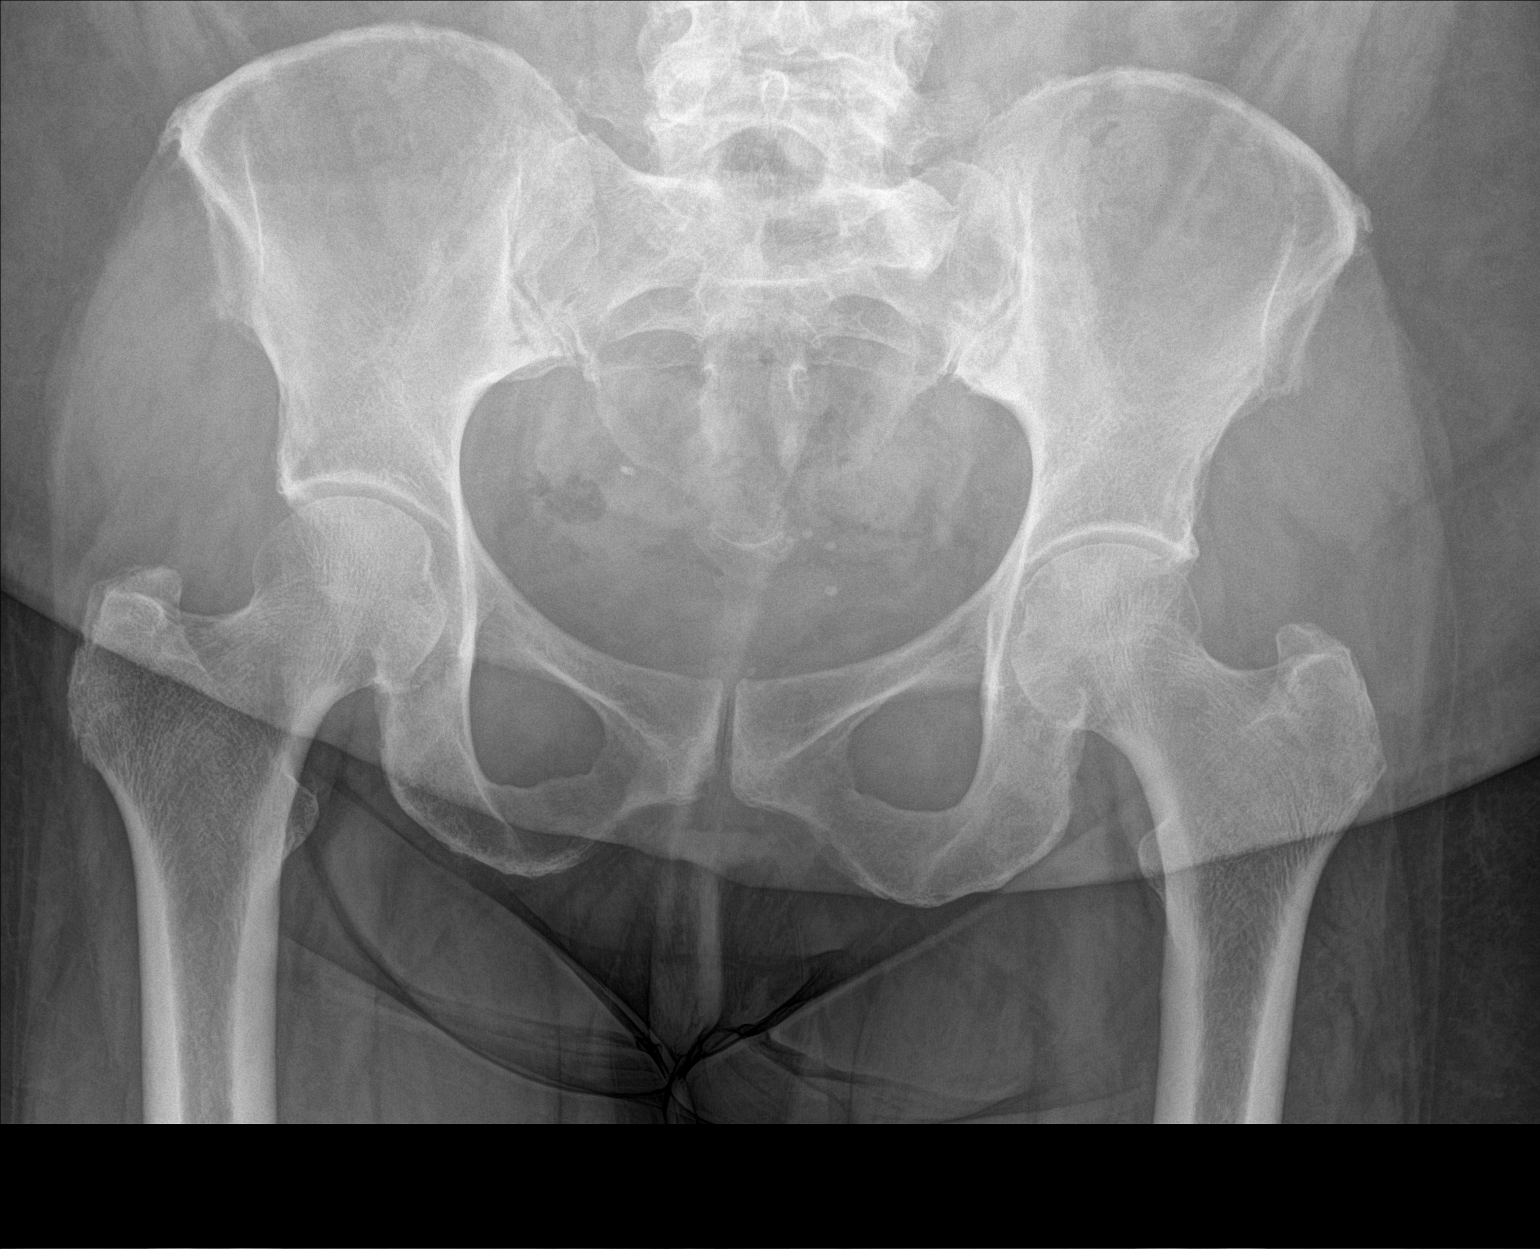

[3 of 3 positions shown; findings below may reference images not displayed]

FINDINGS: Standing frontal pelvis as well as standing frontal and lateral
views of each hip-total five views -obtained. There is no fracture
or dislocation. The joint spaces appear normal. No erosive change.
IMPRESSION: No fracture or dislocation.  No apparent arthropathy.

## 2016-12-04 IMAGING — CR DG HIP (WITH OR WITHOUT PELVIS) 2-3V*L*
1 series · 3 of 3 positions shown · non-contrast
Comparison: None.

CLINICAL DATA: Chronic pain

EXAM:
PELVIS AND BILATERAL HIPS:  4+ VIEWS

[Series 1: dg hip unilat w or w/o pelvis 2-3 views  · non-contrast · 0.14mm/px · 3 of 3 slices shown]
[im 1/3]
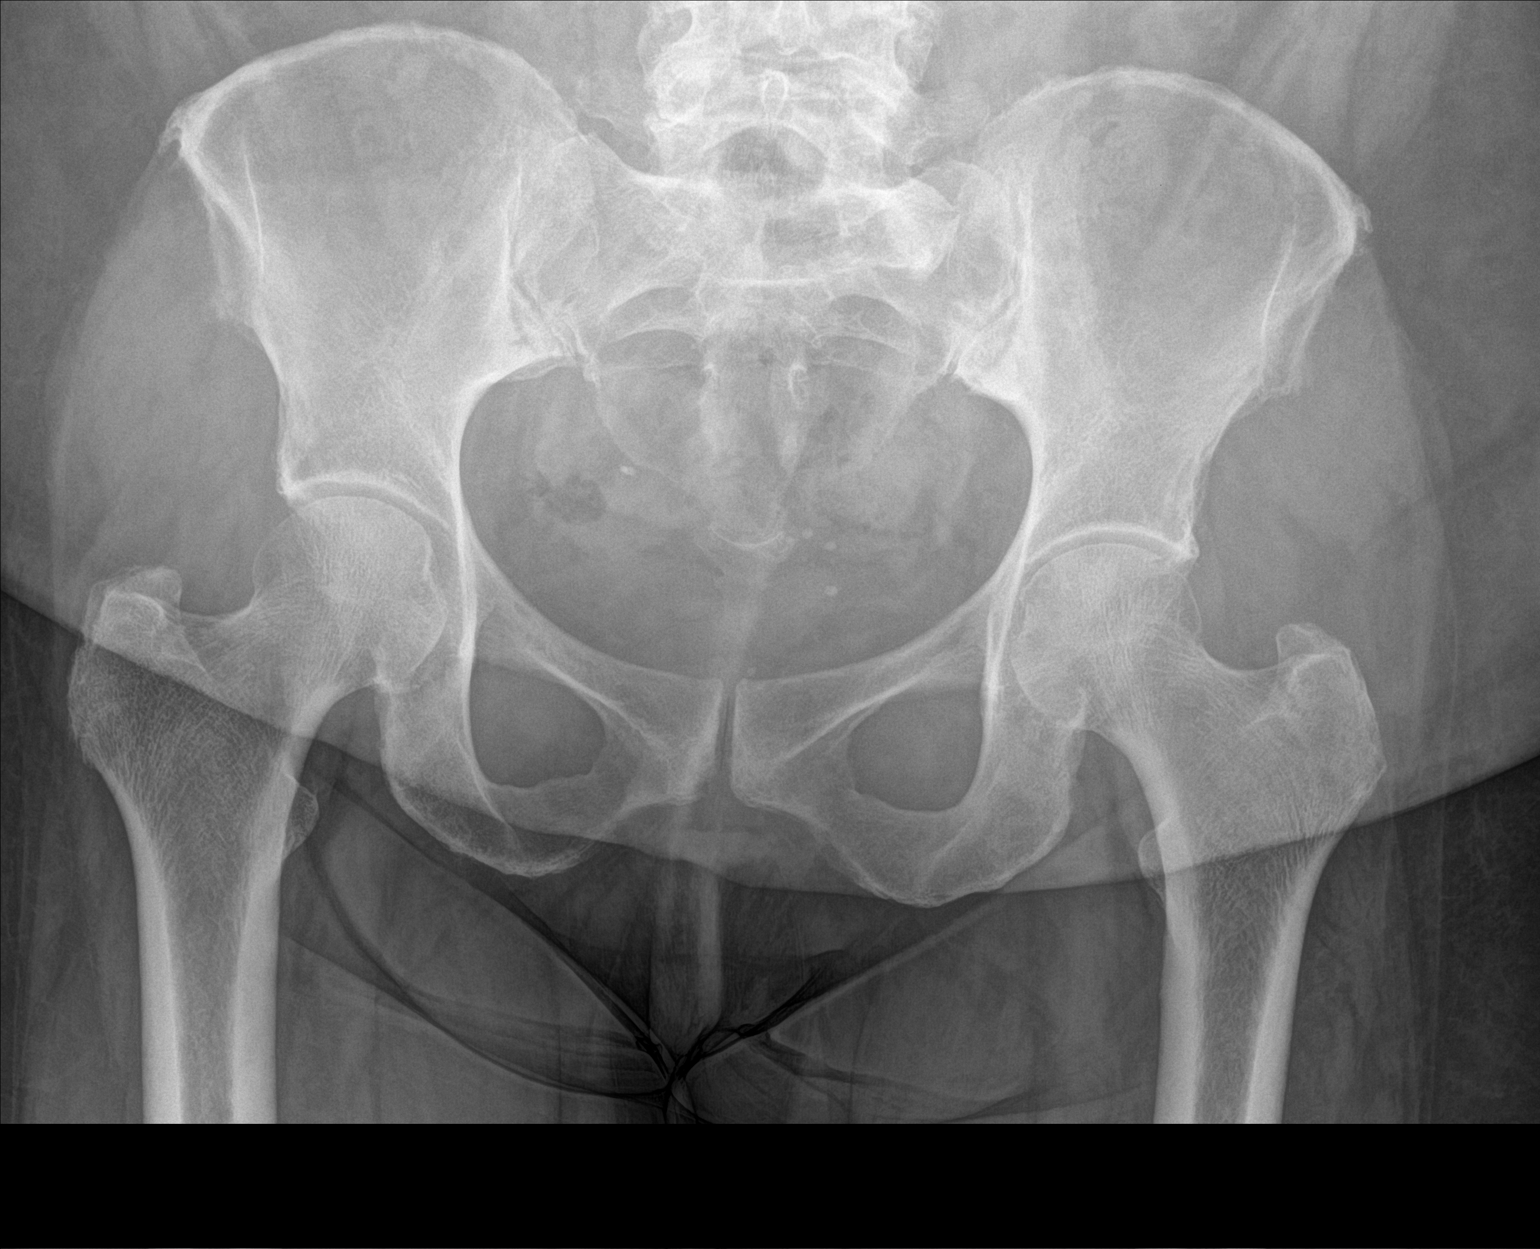
[im 2/3]
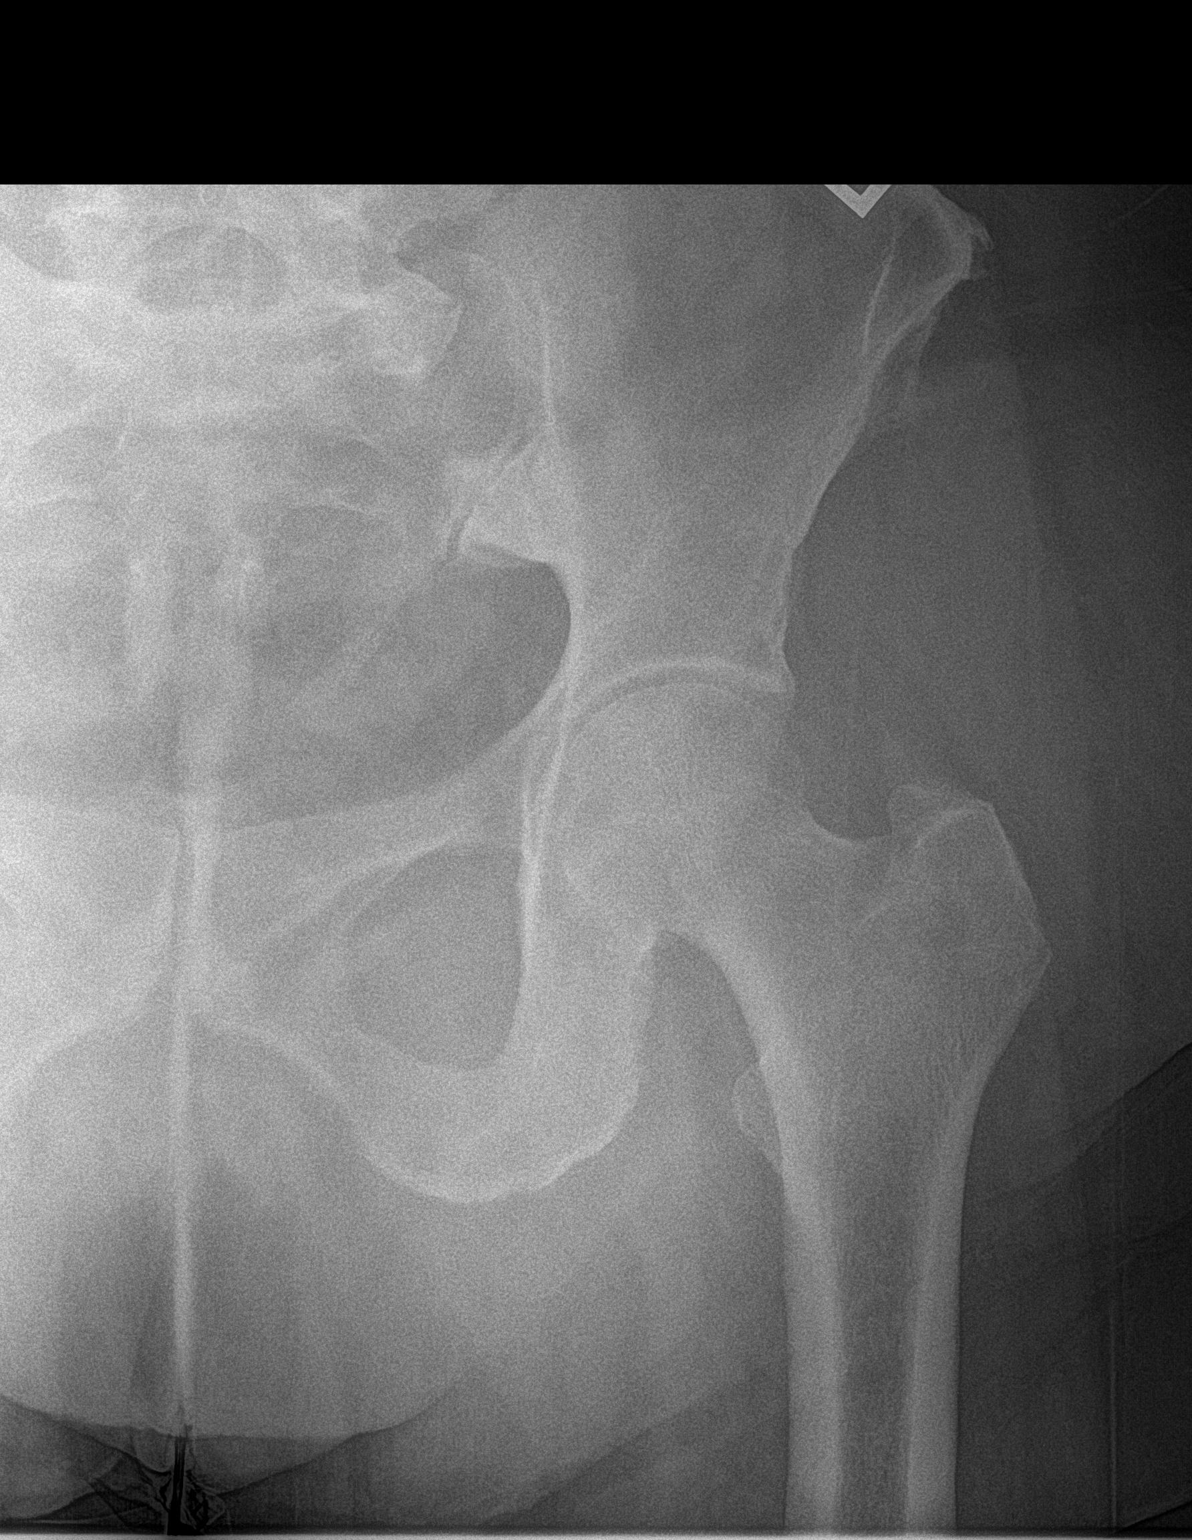
[im 3/3]
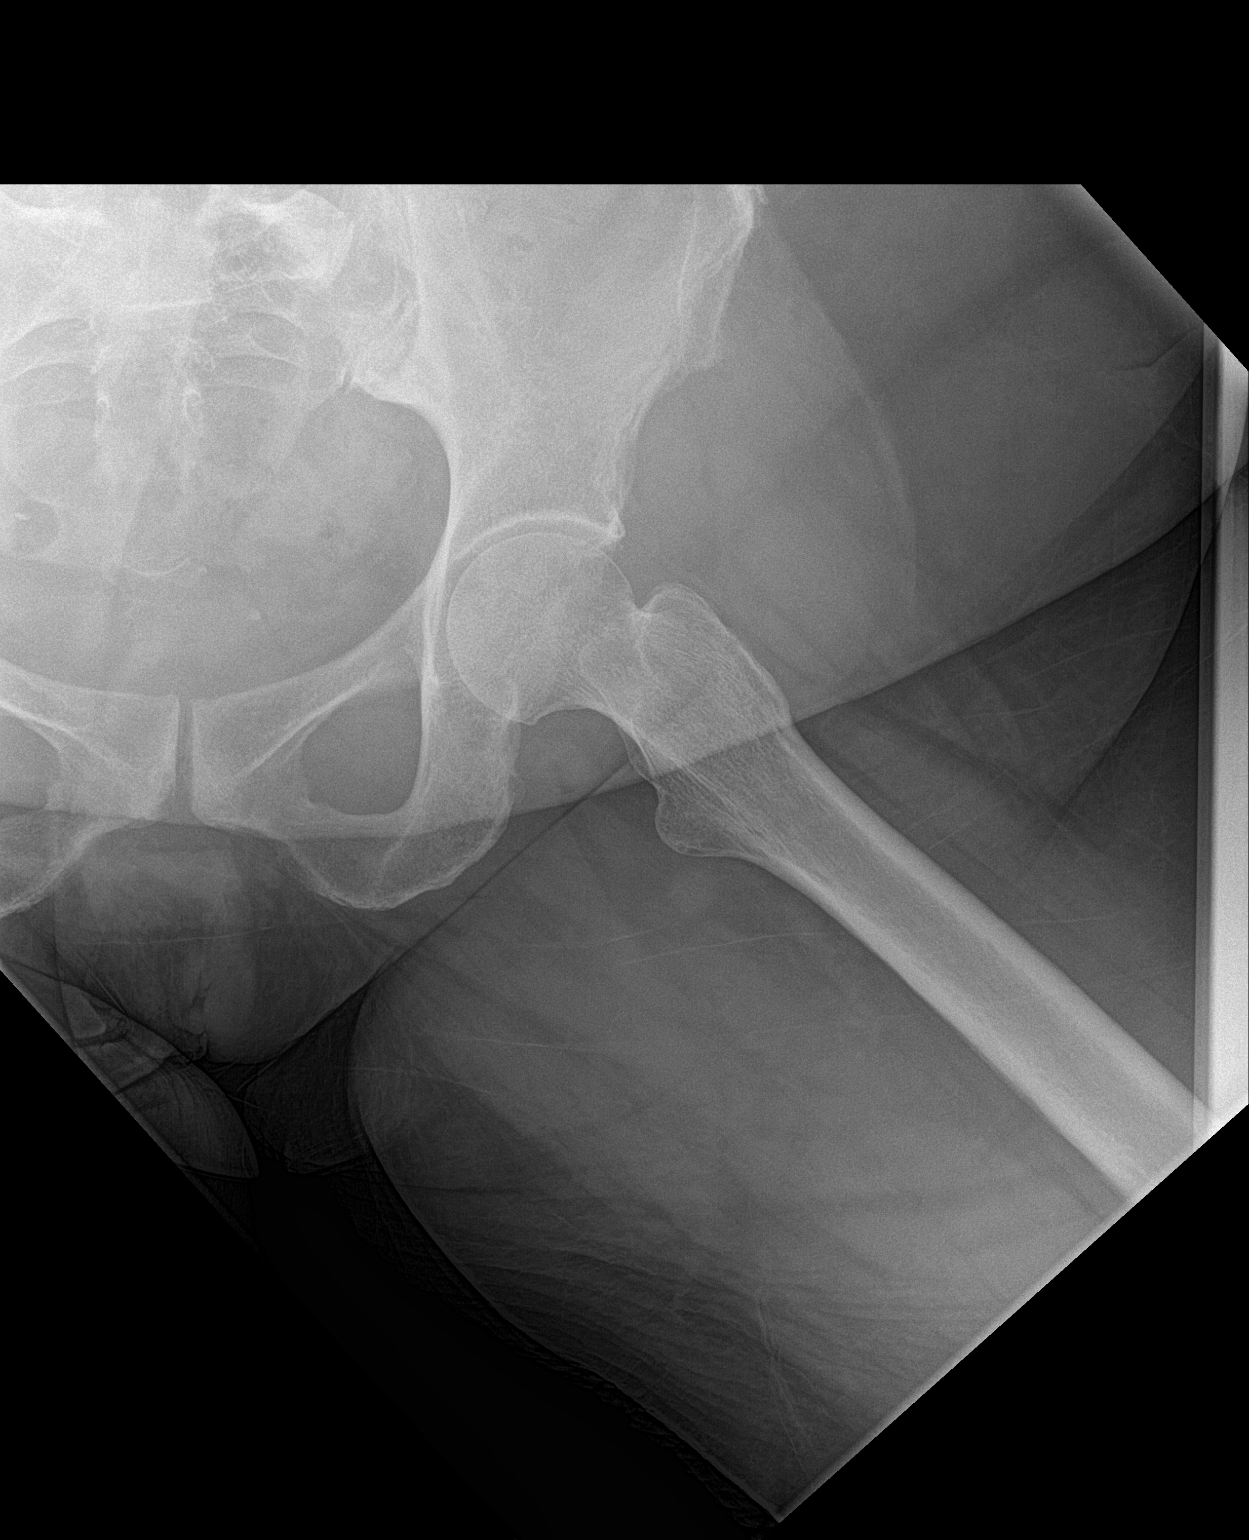

[3 of 3 positions shown; findings below may reference images not displayed]

FINDINGS: Standing frontal pelvis as well as standing frontal and lateral
views of each hip-total five views -obtained. There is no fracture
or dislocation. The joint spaces appear normal. No erosive change.
IMPRESSION: No fracture or dislocation.  No apparent arthropathy.

## 2016-12-06 NOTE — Progress Notes (Signed)
Patient's Name: Wanda [name]  MRN: [id number]  Referring Provider: Colon Branch, MD  DOB: 04-11-1946  PCP: Colon Branch, MD  DOS: 12/11/2016  Note by: Vevelyn Francois NP  Service setting: Ambulatory outpatient  Specialty: Interventional Pain Management  Location: [facility] (AMB) Pain Management Facility    Patient type: Established    Primary Reason(s) for Visit: Encounter for prescription drug management (Level of risk: moderate) CC: Back Pain (lower)  HPI  Wanda [name] is a 71 y.o. year old, female patient, who comes today for a medication management evaluation. 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, L3-4, L4-5, 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 Her primarily concern today is the Back Pain (lower)  Pain Assessment: Self-Reported Pain Score: 3 /10             Reported level is compatible with observation.       Pain Type: Chronic  pain Pain Location: Back Pain Orientation: Lower Pain Descriptors / Indicators: Throbbing, Constant, Aching, Discomfort Pain Frequency: Intermittent  Wanda [name] was last scheduled for an appointment on 09/12/16 for medication management. During today's appointment we reviewed [name]'s chronic pain status, as well as her outpatient medication regimen. She has chronic low back pain. She admits that the pain  radiates into her right buttock. She admits that this pain is worse secondary to her suffering a fall on last Monday. She states that the pain is a lot worse. She is wanting to get an Epidural steroid injection. She denies any numbness, tingling or weakness in her lower extremities. She has been under increased stress secondary to being the sole caregiver for her husband.  The patient  reports that she does not use drugs. Her body mass index is 39.94 kg/m.  Further details on both, my assessment(s), as well as the proposed treatment plan, please see below.  Controlled Substance Pharmacotherapy Assessment REMS (Risk Evaluation and Mitigation Strategy)  Analgesic:Hydrocodone/APAP 5/325 one every 6 hours (20 mg/day of hydrocodone) MME/day:20 mg/day GIgnatius Specking RN  12/11/2016 10:42 AM  Sign at close encounter Nursing Pain Medication Assessment:  Safety precautions to be maintained throughout the outpatient stay will include: orient to surroundings, keep bed in low position, maintain call bell within reach at all times, provide assistance with transfer out of bed and ambulation.  Medication Inspection Compliance: Pill count conducted under aseptic conditions, in front of the patient. Neither the pills nor the bottle was removed from the patient's sight at any time. Once count was completed pills were immediately returned to the patient in their original bottle.  Medication: See above Pill/Patch Count: 92 of 120 pills remain Pill/Patch Appearance: Markings  consistent with prescribed  medication Bottle Appearance: Standard pharmacy container. Clearly labeled. Filled Date: 5 / 41 / 2018 Last Medication intake:  Today   Pharmacokinetics: Liberation and absorption (onset of action): WNL Distribution (time to peak effect): WNL Metabolism and excretion (duration of action): WNL         Pharmacodynamics: Desired effects: Analgesia: Wanda [name] reports >50% benefit. Functional ability: Patient reports that medication allows her to accomplish basic ADLs Clinically meaningful improvement in function (CMIF): Sustained CMIF goals met Perceived effectiveness: Described as relatively effective, allowing for increase in activities of daily living (ADL) Undesirable effects: Side-effects or Adverse reactions: None reported Monitoring: [place] PMP: Online review of the past 26-monthperiod conducted. Compliant with practice rules and regulations List of all UDS test(s) done:  Lab Results  Component Value Date   TOXASSSELUR FINAL 09/12/2016   [place]AL 12/07/2015   TOXASSSELUR FINAL 08/18/2015   TBolingFINAL 05/20/2015   Last UDS on record: ToxAssure Select 13  Date Value Ref Range Status  09/12/2016 FINAL  Final    Comment:    ==================================================================== TOXASSURE SELECT 13 (MW) ==================================================================== Test                             Result       Flag       Units Drug Present and Declared for Prescription Verification   Hydrocodone                    634          EXPECTED   ng/mg creat   Hydromorphone                  85           EXPECTED   ng/mg creat   Norhydrocodone                 736          EXPECTED   ng/mg creat    Sources of hydrocodone include scheduled prescription    medications. Hydromorphone and norhydrocodone are expected    metabolites of hydrocodone. Hydromorphone is also available as a    scheduled prescription  medication. ==================================================================== Test                      Result    Flag   Units      Ref Range   Creatinine              74               mg/dL      >=20 ==================================================================== Declared Medications:  The flagging and interpretation on this report are based on the  following declared medications.  Unexpected results may arise from  inaccuracies in the declared medications.  **Note: The testing scope of this panel includes these medications:  Hydrocodone (Norco)  **Note: The testing scope of this panel does not include following  reported medications:  Acetaminophen (Norco)  Aspirin (Aspirin 81)  Atorvastatin (Lipitor)  Cyclobenzaprine (Flexeril)  Gabapentin  Hydrochlorothiazide  Lisinopril  Magnesium (Mag-Ox)  Sertraline (Zoloft)  Vitamin D ==================================================================== For clinical consultation, please call [phone] ====================================================================    UDS interpretation: Compliant          Medication Assessment Form: Reviewed. Patient indicates being compliant with therapy Treatment compliance: Compliant Risk Assessment Profile: Aberrant behavior: See prior evaluations. None observed or detected  today Comorbid factors increasing risk of overdose: See prior notes. No additional risks detected today Risk of substance use disorder (SUD): Low Opioid Risk Tool (ORT) Total Score: 1  Interpretation Table:  Score <3 = Low Risk for SUD  Score between 4-7 = Moderate Risk for SUD  Score >8 = High Risk for Opioid Abuse   Risk Mitigation Strategies:  Patient Counseling: Covered Patient-Prescriber Agreement (PPA): Present and active  Notification to other healthcare providers: Done  Pharmacologic Plan: No change in therapy, at this time  Laboratory Chemistry  Inflammation Markers Lab Results  Component  Value Date   CRP <0.5 08/18/2015   ESRSEDRATE 28 08/18/2015   (CRP: Acute Phase) (ESR: Chronic Phase) Renal Function Markers Lab Results  Component Value Date   BUN 10 08/30/2016   CREATININE 0.65 08/30/2016   GFRAA >60 08/18/2015   GFRNONAA >60 08/18/2015   Hepatic Function Markers Lab Results  Component Value Date   AST 19 08/30/2016   ALT 19 08/30/2016   ALBUMIN 4.2 08/30/2016   ALKPHOS 74 08/30/2016   HCVAB NEGATIVE 08/30/2016   Electrolytes Lab Results  Component Value Date   NA 136 08/30/2016   K 4.2 08/30/2016   CL 102 08/30/2016   CALCIUM 9.8 08/30/2016   MG 2.0 08/18/2015   Neuropathy Markers No results found for: [id number] Bone Pathology Markers Lab Results  Component Value Date   ALKPHOS 74 08/30/2016   VD25OH 37 05/01/2011   VD125OH2TOT 58 02/01/2015   NT6144RX5 58 02/01/2015   VD2125OH2 <8 02/01/2015   CALCIUM 9.8 08/30/2016   Coagulation Parameters Lab Results  Component Value Date   PLT 176.0 08/30/2016   Cardiovascular Markers Lab Results  Component Value Date   HGB 13.8 08/30/2016   HCT 40.9 08/30/2016   Note: Lab results reviewed.  Recent Diagnostic Imaging Review  Mm Digital Diagnostic Unilat L  Result Date: 08/07/2016 CLINICAL DATA:  Followup left breast probably benign calcifications. EXAM: 2D DIGITAL DIAGNOSTIC UNILATERAL LEFT MAMMOGRAM WITH CAD AND ADJUNCT TOMO COMPARISON:  Previous exam(s). ACR Breast Density Category c: The breast tissue is heterogeneously dense, which may obscure small masses. FINDINGS: The previously described probably benign calcifications in the upper-outer left breast are unchanged. No interval findings suspicious for malignancy. Mammographic images were processed with CAD. IMPRESSION: Stable left breast probably benign calcifications. RECOMMENDATION: Bilateral diagnostic mammogram in 4 months. That will be 1 year since mammographic evaluation of the right breast. I have discussed the findings and  recommendations with the patient. Results were also provided in writing at the conclusion of the visit. If applicable, a reminder letter will be sent to the patient regarding the next appointment. BI-RADS CATEGORY  3: Probably benign. Electronically Signed   By: Claudie Revering M.D.   On: 08/07/2016 12:22   Note: Imaging results reviewed.          Meds  The patient has a current medication list which includes the following prescription(s): aspirin ec, atorvastatin, vitamin d, cyclobenzaprine, hydrochlorothiazide, lisinopril, magnesium oxide, sertraline, zolpidem, gabapentin, hydrocodone-acetaminophen, hydrocodone-acetaminophen, hydrocodone-acetaminophen, and orphenadrine.  Current Outpatient Prescriptions on File Prior to Visit  Medication Sig  . aspirin EC 81 MG tablet Take 81 mg by mouth daily.  [name] atorvastatin (LIPITOR) 10 MG tablet Take 1 tablet (10 mg total) by mouth at bedtime.  . Cholecalciferol (VITAMIN D) 2000 UNITS tablet Take 2,000 Units by mouth daily.  . cyclobenzaprine (FLEXERIL) 10 MG tablet Take 1 tablet (10 mg total) by mouth 2 (two) times daily as needed for  muscle spasms.  . hydrochlorothiazide (HYDRODIURIL) 25 MG tablet Take 1 tablet (25 mg total) by mouth daily.  [name] lisinopril (PRINIVIL,ZESTRIL) 10 MG tablet Take 1 tablet (10 mg total) by mouth daily.  . magnesium oxide (MAG-OX) 400 MG tablet Take 1 tablet (400 mg total) by mouth daily.  . sertraline (ZOLOFT) 50 MG tablet Take 3 tablets (150 mg total) by mouth daily. (Patient taking differently: Take 100 mg by mouth daily. )  . zolpidem (AMBIEN) 5 MG tablet Take 1 tablet (5 mg total) by mouth at bedtime as needed for sleep.   No current facility-administered medications on file prior to visit.    ROS  Constitutional: Denies any fever or chills Gastrointestinal: No reported hemesis, hematochezia, vomiting, or acute GI distress Musculoskeletal: Denies any acute onset joint swelling, redness, loss of ROM, or  weakness Neurological: No reported episodes of acute onset apraxia, aphasia, dysarthria, agnosia, amnesia, paralysis, loss of coordination, or loss of consciousness  Allergies  Ms. Sagen is allergic to simvastatin.  [place]  Drug: Ms. Marcus  reports that she does not use drugs. Alcohol:  reports that she does not drink alcohol. Tobacco:  reports that she has never smoked. She has never used smokeless tobacco. Medical:  has a past medical history of Allergy; Chronic back pain; Depression; DJD (degenerative joint disease); Hyperlipidemia; Hypertension; Menopause; and TMJ PAIN (05/17/2010). Family: family history includes Breast cancer in her sister; Cancer in her brother and sister; Colon cancer in her brother.  Past Surgical History:  Procedure Laterality Date  . G3 P3      Constitutional Exam  General appearance: Well nourished, well developed, and well hydrated. In no apparent acute distress Vitals:   12/11/16 1028  BP: 119/73  Pulse: 81  Resp: 18  Temp: 97.7 F (36.5 C)  SpO2: 100%  Weight: 240 lb (108.9 kg)  Height: 5' 5" (1.651 m)   BMI Assessment: Estimated body mass index is 39.94 kg/m as calculated from the following:   Height as of this encounter: 5' 5" (1.651 m).   Weight as of this encounter: 240 lb (108.9 kg).  BMI interpretation table: BMI level Category Range association with higher incidence of chronic pain  <18 kg/m2 Underweight   18.5-24.9 kg/m2 Ideal body weight   25-29.9 kg/m2 Overweight Increased incidence by 20%  30-34.9 kg/m2 Obese (Class I) Increased incidence by 68%  35-39.9 kg/m2 Severe obesity (Class II) Increased incidence by 136%  >40 kg/m2 Extreme obesity (Class III) Increased incidence by 254%   BMI Readings from Last 4 Encounters:  12/11/16 39.94 kg/m  10/31/16 41.20 kg/m  09/12/16 42.05 kg/m  08/30/16 42.40 kg/m   Wt Readings from Last 4 Encounters:  12/11/16 240 lb (108.9 kg)  10/31/16 240 lb (108.9 kg)  09/12/16 245 lb (111.1  kg)  08/30/16 247 lb (112 kg)  Psych/Mental status: Alert, oriented x 3 (person, place, & time)       Eyes: PERLA Respiratory: No evidence of acute respiratory distress  Lumbar Spine Exam  Inspection: No masses, redness, or swelling Alignment: Symmetrical Functional ROM: Unrestricted ROM      Stability: No instability detected Muscle strength & Tone: Functionally intact Sensory: Unimpaired Palpation: Complains of area being tender to palpation greater on the right side       Provocative Tests: Lumbar Hyperextension and rotation test: evaluation deferred today due to pain. Patrick's Maneuver: evaluation deferred today  Gait & Posture Assessment  Ambulation: Unassisted Gait: Relatively normal for age and body habitus Posture: WNL   Lower Extremity Exam    Side: Right lower extremity  Side: Left lower extremity  Inspection: No masses, redness, swelling, or asymmetry. No contractures  Inspection: No masses, redness, swelling, or asymmetry. No contractures  Functional ROM: Unrestricted ROM          Functional ROM: Unrestricted ROM          Muscle strength & Tone: Functionally intact  Muscle strength & Tone: Functionally intact  Sensory: Unimpaired  Sensory: Unimpaired  Palpation: No palpable anomalies  Palpation: No palpable anomalies   Assessment  Primary Diagnosis & Pertinent Problem List: The primary encounter diagnosis was Lumbar spondylosis. 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  Status Diagnosis  Worsening Worsening Controlled 1. Lumbar spondylosis   2. Lumbar facet syndrome (Bilateral)  (R>L)   3. Fibromyalgia   4. Chronic pain syndrome   5. Neurogenic pain   6. Musculoskeletal pain   7. Chronic low back pain (Bilateral) (L>R)   8.  chronic lumbar radicular pain (Left) (S1 dermatome)   9. Chronic radicular pain of lower extremity (Left) (S1 dermatome)   10. 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 Plan of Care  Pharmacotherapy (Medications Ordered): Meds ordered this encounter  Medications  . HYDROcodone-acetaminophen (NORCO/VICODIN) 5-325 MG tablet    Sig: Take 1 tablet by mouth every 6 (six) hours as needed for severe pain.    Dispense:  120 tablet    Refill:  0    Do not place this medication, or any other prescription from our practice, on "Automatic Refill". Patient may have prescription filled one day early if pharmacy is closed on scheduled refill date. Do not fill until: 02/14/17 To last until: 03/16/17    Order Specific Question:   Supervising Provider    Answer:   Milinda Pointer [phone]  . HYDROcodone-acetaminophen (NORCO/VICODIN) 5-325 MG tablet    Sig: Take 1 tablet by mouth every 6 (six) hours as needed for severe pain.    Dispense:  120 tablet    Refill:  0    Do not place this medication, or any other prescription from our practice, on "Automatic Refill". Patient may have prescription filled one day early if pharmacy is closed on scheduled refill date. Do not fill until: 12/16/16 To last until: 01/15/17    Order Specific Question:   Supervising Provider    Answer:   Milinda Pointer [phone]  . DISCONTD: HYDROcodone-acetaminophen (NORCO/VICODIN) 5-325 MG tablet    Sig: Take 1 tablet by mouth every 6 (six) hours as needed for severe pain.    Dispense:  120 tablet    Refill:  0    Do not place this medication, or any other prescription from our practice, on "Automatic Refill". Patient may have prescription filled one day  early if pharmacy is closed on scheduled refill date. Do not fill until: 01/15/17 To last until: 02/14/17    Order Specific Question:   Supervising Provider    Answer:   Milinda Pointer [phone]  . gabapentin (  NEURONTIN) 300 MG capsule    Sig: Take 1 capsule (300 mg total) by mouth 3 (three) times daily.    Dispense:  90 capsule    Refill:  2    Do not place this medication, or any other prescription from our practice, on "Automatic Refill". Patient may have prescription filled one day early if pharmacy is closed on scheduled refill date.    Order Specific Question:   Supervising Provider    Answer:   Milinda Pointer [phone]  . ketorolac (TORADOL) injection 60 mg  . orphenadrine (NORFLEX) 30 MG/ML injection    Sig: Inject 2 mLs (60 mg total) into the muscle once. Screen patient for allergies to the medication or its components. Do not administer if patient has documented severe renal or hepatic compromise.    Dispense:  2 mL    Refill:  0    Order Specific Question:   Supervising Provider    Answer:   Milinda Pointer [phone]  . HYDROcodone-acetaminophen (NORCO/VICODIN) 5-325 MG tablet    Sig: Take 1 tablet by mouth every 6 (six) hours as needed for severe pain.    Dispense:  120 tablet    Refill:  0    Do not place this medication, or any other prescription from our practice, on "Automatic Refill". Patient may have prescription filled one day early if pharmacy is closed on scheduled refill date. Do not fill until: 01/15/17 To last until: 02/14/17    Order Specific Question:   Supervising Provider    Answer:   Milinda Pointer [phone]   New Prescriptions   ORPHENADRINE (NORFLEX) 30 MG/ML INJECTION    Inject 2 mLs (60 mg total) into the muscle once. Screen patient for allergies to the medication or its components. Do not administer if patient has documented severe renal or hepatic compromise.   Medications administered today: We administered ketorolac. Lab-work,  procedure(s), and/or referral(s): No orders of the defined types were placed in this encounter.  Imaging and/or referral(s): None  Interventional therapies: Planned, scheduled, and/or pending:   Right L4-5 lumbar Epidural steroid injection vs lumbar facet injection on right Increased Gabapentin 300 mg Bid for 1 week then may increase to TID if desired Continue other regimen    Considering:   Diagnostic/palliative left intra-articular knee injection.  Diagnostic bilateral lumbar facet block Left L4-5 lumbar epidural steroid injection   Palliative PRN treatment(s):   Palliative bilateral lumbar facet block Left L4-5 lumbar epidural steroid injection Palliative left intra-articular knee injection.    Provider-requested follow-up: Return in about 3 months (around 03/13/2017) for Medication Mgmt.  Future Appointments Date Time Provider [place]  03/13/2017 9:45 AM Vevelyn Francois, NP [facility] None   Primary Care Physician: Colon Branch, MD Location: [facility] Outpatient Pain Management Facility Note by: Vevelyn Francois NP Date: 12/11/2016; Time: 11:48 AM  Pain Score Disclaimer: We use the NRS-11 scale. This is a self-reported, subjective measurement of pain severity with only modest accuracy. It is used primarily to identify changes within a particular patient. It must be understood that outpatient pain scales are significantly less accurate that those used for research, where they can be applied under ideal controlled circumstances with minimal exposure to variables. In reality, the score is likely to be a combination of pain intensity and pain affect, where pain affect describes the degree of emotional arousal or changes in action readiness caused by the sensory experience of pain. Factors such as social and work situation, setting, emotional state, anxiety levels,  expectation, and prior pain experience may influence pain perception and show large inter-individual differences that may  also be affected by time variables.  Patient instructions provided during this appointment: Patient Instructions   GENERAL RISKS AND COMPLICATIONS  What are the risk, side effects and possible complications? Generally speaking, most procedures are safe.  However, with any procedure there are risks, side effects, and the possibility of complications.  The risks and complications are dependent upon the sites that are lesioned, or the type of nerve block to be performed.  The closer the procedure is to the spine, the more serious the risks are.  Great care is taken when placing the radio frequency needles, block needles or lesioning probes, but sometimes complications can occur. 1. Infection: Any time there is an injection through the skin, there is a risk of infection.  This is why sterile conditions are used for these blocks.  There are four possible types of infection. 1. Localized skin infection. 2. Central Nervous System Infection-This can be in the form of Meningitis, which can be deadly. 3. Epidural Infections-This can be in the form of an epidural abscess, which can cause pressure inside of the spine, causing compression of the spinal cord with subsequent paralysis. This would require an emergency surgery to decompress, and there are no guarantees that the patient would recover from the paralysis. 4. Discitis-This is an infection of the intervertebral discs.  It occurs in about 1% of discography procedures.  It is difficult to treat and it may lead to surgery.        2. Pain: the needles have to go through skin and soft tissues, will cause soreness.       3. Damage to internal structures:  The nerves to be lesioned may be near blood vessels or    other nerves which can be potentially damaged.       4. Bleeding: Bleeding is more common if the patient is taking blood thinners such as  aspirin, Coumadin, Ticiid, Plavix, etc., or if he/she have some genetic predisposition  such as hemophilia.  Bleeding into the spinal canal can cause compression of the spinal  cord with subsequent paralysis.  This would require an emergency surgery to  decompress and there are no guarantees that the patient would recover from the  paralysis.       5. Pneumothorax:  Puncturing of a lung is a possibility, every time a needle is introduced in  the area of the chest or upper back.  Pneumothorax refers to free air around the  collapsed lung(s), inside of the thoracic cavity (chest cavity).  Another two possible  complications related to a similar event would include: Hemothorax and Chylothorax.   These are variations of the Pneumothorax, where instead of air around the collapsed  lung(s), you may have blood or chyle, respectively.       6. Spinal headaches: They may occur with any procedures in the area of the spine.       7. Persistent CSF (Cerebro-Spinal Fluid) leakage: This is a rare problem, but may occur  with prolonged intrathecal or epidural catheters either due to the formation of a fistulous  track or a dural tear.       8. Nerve damage: By working so close to the spinal cord, there is always a possibility of  nerve damage, which could be as serious as a permanent spinal cord injury with  paralysis.       9. Death:  Although rare,  severe deadly allergic reactions known as "Anaphylactic  reaction" can occur to any of the medications used.      10. Worsening of the symptoms:  We can always make thing worse.  What are the chances of something like this happening? Chances of any of this occuring are extremely low.  By statistics, you have more of a chance of getting killed in a motor vehicle accident: while driving to the hospital than any of the above occurring .  Nevertheless, you should be aware that they are possibilities.  In general, it is similar to taking a shower.  Everybody knows that you can slip, hit your head and get killed.  Does that mean that you should not shower again?  Nevertheless always keep  in mind that statistics do not mean anything if you happen to be on the wrong side of them.  Even if a procedure has a 1 (one) in a 1,000,000 (million) chance of going wrong, it you happen to be that one..Also, keep in mind that by statistics, you have more of a chance of having something go wrong when taking medications.  Who should not have this procedure? If you are on a blood thinning medication (e.g. Coumadin, Plavix, see list of "Blood Thinners"), or if you have an active infection going on, you should not have the procedure.  If you are taking any blood thinners, please inform your physician.  How should I prepare for this procedure?  Do not eat or drink anything at least six hours prior to the procedure.  Bring a driver with you .  It cannot be a taxi.  Come accompanied by an adult that can drive you back, and that is strong enough to help you if your legs get weak or numb from the local anesthetic.  Take all of your medicines the morning of the procedure with just enough water to swallow them.  If you have diabetes, make sure that you are scheduled to have your procedure done first thing in the morning, whenever possible.  If you have diabetes, take only half of your insulin dose and notify our nurse that you have done so as soon as you arrive at the clinic.  If you are diabetic, but only take blood sugar pills (oral hypoglycemic), then do not take them on the morning of your procedure.  You may take them after you have had the procedure.  Do not take aspirin or any aspirin-containing medications, at least eleven (11) days prior to the procedure.  They may prolong bleeding.  Wear loose fitting clothing that may be easy to take off and that you would not mind if it got stained with Betadine or blood.  Do not wear any jewelry or perfume  Remove any nail coloring.  It will interfere with some of our monitoring equipment.  NOTE: Remember that this is not meant to be interpreted as a  complete list of all possible complications.  Unforeseen problems may occur.  BLOOD THINNERS The following drugs contain aspirin or other products, which can cause increased bleeding during surgery and should not be taken for 2 weeks prior to and 1 week after surgery.  If you should need take something for relief of minor pain, you may take acetaminophen which is found in Tylenol,m Datril, Anacin-3 and Panadol. It is not blood thinner. The products listed below are.  Do not take any of the products listed below in addition to any listed on your instruction sheet.  A.P.C  or A.P.C with Codeine Codeine Phosphate Capsules #3 Ibuprofen Ridaura  ABC compound Congesprin Imuran rimadil  Advil Cope Indocin Robaxisal  Alka-Seltzer Effervescent Pain Reliever and Antacid Coricidin or Coricidin-D  Indomethacin Rufen  Alka-Seltzer plus Cold Medicine Cosprin Ketoprofen S-A-C Tablets  Anacin Analgesic Tablets or Capsules Coumadin Korlgesic Salflex  Anacin Extra Strength Analgesic tablets or capsules CP-2 Tablets Lanoril Salicylate  Anaprox Cuprimine Capsules Levenox Salocol  Anexsia-D Dalteparin Magan Salsalate  Anodynos Darvon compound Magnesium Salicylate Sine-off  Ansaid Dasin Capsules Magsal Sodium Salicylate  Anturane Depen Capsules Marnal Soma  APF Arthritis pain formula Dewitt's Pills Measurin Stanback  Argesic Dia-Gesic Meclofenamic Sulfinpyrazone  Arthritis Bayer Timed Release Aspirin Diclofenac Meclomen Sulindac  Arthritis pain formula Anacin Dicumarol Medipren Supac  Analgesic (Safety coated) Arthralgen Diffunasal Mefanamic Suprofen  Arthritis Strength Bufferin Dihydrocodeine Mepro Compound Suprol  Arthropan liquid Dopirydamole Methcarbomol with Aspirin Synalgos  ASA tablets/Enseals Disalcid Micrainin Tagament  Ascriptin Doan's Midol Talwin  Ascriptin A/D Dolene Mobidin Tanderil  Ascriptin Extra Strength Dolobid Moblgesic Ticlid  Ascriptin with Codeine Doloprin or Doloprin with Codeine  Momentum Tolectin  Asperbuf Duoprin Mono-gesic Trendar  Aspergum Duradyne Motrin or Motrin IB Triminicin  Aspirin plain, buffered or enteric coated Durasal Myochrisine Trigesic  Aspirin Suppositories Easprin Nalfon Trillsate  Aspirin with Codeine Ecotrin Regular or Extra Strength Naprosyn Uracel  Atromid-S Efficin Naproxen Ursinus  Auranofin Capsules Elmiron Neocylate Vanquish  Axotal Emagrin Norgesic Verin  Azathioprine Empirin or Empirin with Codeine Normiflo Vitamin E  Azolid Emprazil Nuprin Voltaren  Bayer Aspirin plain, buffered or children's or timed BC Tablets or powders Encaprin Orgaran Warfarin Sodium  Buff-a-Comp Enoxaparin Orudis Zorpin  Buff-a-Comp with Codeine Equegesic Os-Cal-Gesic   Buffaprin Excedrin plain, buffered or Extra Strength Oxalid   Bufferin Arthritis Strength Feldene Oxphenbutazone   Bufferin plain or Extra Strength Feldene Capsules Oxycodone with Aspirin   Bufferin with Codeine Fenoprofen Fenoprofen Pabalate or Pabalate-SF   Buffets II Flogesic Panagesic   Buffinol plain or Extra Strength Florinal or Florinal with Codeine Panwarfarin   Buf-Tabs Flurbiprofen Penicillamine   Butalbital Compound Four-way cold tablets Penicillin   Butazolidin Fragmin Pepto-Bismol   Carbenicillin Geminisyn Percodan   Carna Arthritis Reliever Geopen Persantine   Carprofen Gold's salt Persistin   Chloramphenicol Goody's Phenylbutazone   Chloromycetin Haltrain Piroxlcam   Clmetidine heparin Plaquenil   Cllnoril Hyco-pap Ponstel   Clofibrate Hydroxy chloroquine Propoxyphen         Before stopping any of these medications, be sure to consult the physician who ordered them.  Some, such as Coumadin (Warfarin) are ordered to prevent or treat serious conditions such as "deep thrombosis", "pumonary embolisms", and other heart problems.  The amount of time that you may need off of the medication may also vary with the medication and the reason for which you were taking it.  If you are  taking any of these medications, please make sure you notify your pain physician before you undergo any procedures.         ____________________________________________________________________________________________  Medication Rules  Applies to: All patients receiving prescriptions (written or electronic).  Pharmacy of record: Pharmacy where electronic prescriptions will be sent. If written prescriptions are taken to a different pharmacy, please inform the nursing staff. The pharmacy listed in the electronic medical record should be the one where you would like electronic prescriptions to be sent.  Prescription refills: Only during scheduled appointments. Applies to both, written and electronic prescriptions.  NOTE: The following applies primarily to controlled substances (Opioid Pain Medications)  Patient's responsibilities:  1. Pain Pills: Bring all pain pills to every appointment (except for procedure appointments). 2. Pill Bottles: Bring pills in original pharmacy bottle. Always bring newest bottle. Bring bottle, even if empty. 3. Medication refills: You are responsible for knowing and keeping track of what medications you need refilled. The day before your appointment, write a list of all prescriptions that need to be refilled. Bring that list to your appointment and give it to the admitting nurse. Prescriptions will be written only during appointments. If you forget a medication, it will not be "Called in", "Faxed", or "electronically sent". You will need to get another appointment to get these prescribed. 4. Prescription Accuracy: You are responsible for carefully inspecting your prescriptions before leaving our office. Have the discharge nurse carefully go over each prescription with you, before taking them home. Make sure that your name is accurately spelled, that your address is correct. Check the name and dose of your medication to make sure it is accurate. Check the number of  pills, and the written instructions to make sure they are clear and accurate. Make sure that you are given enough medication to last until your next medication refill appointment. 5. Taking Medication: Take medication as prescribed. Never take more pills than instructed. Never take medication more frequently than prescribed. Taking less pills or less frequently is permitted and encouraged, when it comes to controlled substances (written prescriptions).  6. Inform other Doctors: Always inform, all of your healthcare providers, of all the medications you take. 7. Pain Medication from other Providers: You are not allowed to accept any additional pain medication from any other Doctor or Healthcare provider. There are two exceptions to this rule. (see below) In the event that you require additional pain medication, you are responsible for notifying us, as stated below. 8. Medication Agreement: You are responsible for carefully reading and following our Medication Agreement. This must be signed before receiving any prescriptions from our practice. Safely store a copy of your signed Agreement. Violations to the Agreement will result in no further prescriptions. (Additional copies of our Medication Agreement are available upon request.) 9. Laws, Rules, & Regulations: All patients are expected to follow all Federal and [employer], Rules, [employer]. Ignorance of the Laws does not constitute a valid excuse.  Exceptions: There are only two exceptions to the rule of not receiving pain medications from other Healthcare Providers. 1. Exception #1 (Emergencies): In the event of an emergency (i.e.: accident requiring emergency care), you are allowed to receive additional pain medication. However, you are responsible for: As soon as you are able, call our office [phone], at any time of the day or night, and leave a message stating your name, the date and nature of the emergency, and the name and dose of  the medication prescribed. In the event that your call is answered by a member of our staff, make sure to document and save the date, time, and the name of the person that took your information.  2. Exception #2 (Planned Surgery): In the event that you are scheduled by another doctor or dentist to have any type of surgery or procedure, you are allowed (for a period no longer than 30 days), to receive additional pain medication, for the acute post-op pain. However, in this case, you are responsible for picking up a copy of our "Post-op Pain Management for Surgeons" handout, and giving it to your surgeon or dentist. This document is available at our office, and does not require  an appointment to obtain it. Simply go to our office during business hours (Monday-Thursday from 8:00 AM to 4:00 PM) (Friday 8:00 AM to 12:00 Noon) or if you have a scheduled appointment with [place], prior to your surgery, and ask for it by name. In addition, you will need to provide [place] with your name, name of your surgeon, type of surgery, and date of procedure or surgery.  _____________________________________________________________________________________________

## 2016-12-07 ENCOUNTER — Ambulatory Visit: Payer: Medicare HMO | Admitting: Nurse Practitioner

## 2016-12-11 ENCOUNTER — Encounter: Payer: Self-pay | Admitting: Nurse Practitioner

## 2016-12-11 ENCOUNTER — Ambulatory Visit: Payer: Medicare HMO | Attending: Pain Medicine | Admitting: Nurse Practitioner

## 2016-12-11 VITALS — BP 119/73 | HR 81 | Temp 97.7°F | Resp 18 | Ht 65.0 in | Wt 240.0 lb

## 2016-12-11 DIAGNOSIS — F329 Major depressive disorder, single episode, unspecified: Secondary | ICD-10-CM | POA: Diagnosis not present

## 2016-12-11 DIAGNOSIS — M791 Myalgia: Secondary | ICD-10-CM | POA: Diagnosis not present

## 2016-12-11 DIAGNOSIS — Z7982 Long term (current) use of aspirin: Secondary | ICD-10-CM | POA: Insufficient documentation

## 2016-12-11 DIAGNOSIS — F419 Anxiety disorder, unspecified: Secondary | ICD-10-CM | POA: Insufficient documentation

## 2016-12-11 DIAGNOSIS — B029 Zoster without complications: Secondary | ICD-10-CM | POA: Insufficient documentation

## 2016-12-11 DIAGNOSIS — Z79891 Long term (current) use of opiate analgesic: Secondary | ICD-10-CM | POA: Insufficient documentation

## 2016-12-11 DIAGNOSIS — E785 Hyperlipidemia, unspecified: Secondary | ICD-10-CM | POA: Insufficient documentation

## 2016-12-11 DIAGNOSIS — M9983 Other biomechanical lesions of lumbar region: Secondary | ICD-10-CM

## 2016-12-11 DIAGNOSIS — M170 Bilateral primary osteoarthritis of knee: Secondary | ICD-10-CM | POA: Insufficient documentation

## 2016-12-11 DIAGNOSIS — M79605 Pain in left leg: Secondary | ICD-10-CM | POA: Diagnosis not present

## 2016-12-11 DIAGNOSIS — M5410 Radiculopathy, site unspecified: Secondary | ICD-10-CM | POA: Diagnosis not present

## 2016-12-11 DIAGNOSIS — M797 Fibromyalgia: Secondary | ICD-10-CM

## 2016-12-11 DIAGNOSIS — J309 Allergic rhinitis, unspecified: Secondary | ICD-10-CM | POA: Diagnosis not present

## 2016-12-11 DIAGNOSIS — M4696 Unspecified inflammatory spondylopathy, lumbar region: Secondary | ICD-10-CM

## 2016-12-11 DIAGNOSIS — M7918 Myalgia, other site: Secondary | ICD-10-CM

## 2016-12-11 DIAGNOSIS — M47816 Spondylosis without myelopathy or radiculopathy, lumbar region: Secondary | ICD-10-CM | POA: Insufficient documentation

## 2016-12-11 DIAGNOSIS — M25561 Pain in right knee: Secondary | ICD-10-CM | POA: Diagnosis not present

## 2016-12-11 DIAGNOSIS — E559 Vitamin D deficiency, unspecified: Secondary | ICD-10-CM | POA: Insufficient documentation

## 2016-12-11 DIAGNOSIS — G894 Chronic pain syndrome: Secondary | ICD-10-CM | POA: Insufficient documentation

## 2016-12-11 DIAGNOSIS — I10 Essential (primary) hypertension: Secondary | ICD-10-CM | POA: Diagnosis not present

## 2016-12-11 DIAGNOSIS — M5442 Lumbago with sciatica, left side: Secondary | ICD-10-CM | POA: Diagnosis not present

## 2016-12-11 DIAGNOSIS — M792 Neuralgia and neuritis, unspecified: Secondary | ICD-10-CM | POA: Insufficient documentation

## 2016-12-11 DIAGNOSIS — G8929 Other chronic pain: Secondary | ICD-10-CM

## 2016-12-11 DIAGNOSIS — M25562 Pain in left knee: Secondary | ICD-10-CM | POA: Diagnosis not present

## 2016-12-11 DIAGNOSIS — M48061 Spinal stenosis, lumbar region without neurogenic claudication: Secondary | ICD-10-CM | POA: Diagnosis not present

## 2016-12-11 DIAGNOSIS — M25551 Pain in right hip: Secondary | ICD-10-CM | POA: Insufficient documentation

## 2016-12-11 DIAGNOSIS — M25552 Pain in left hip: Secondary | ICD-10-CM | POA: Insufficient documentation

## 2016-12-11 DIAGNOSIS — M5416 Radiculopathy, lumbar region: Secondary | ICD-10-CM

## 2016-12-11 DIAGNOSIS — R252 Cramp and spasm: Secondary | ICD-10-CM | POA: Diagnosis not present

## 2016-12-11 DIAGNOSIS — M545 Low back pain: Secondary | ICD-10-CM | POA: Diagnosis not present

## 2016-12-11 MED ORDER — GABAPENTIN 300 MG PO CAPS: 300.0000 mg | ORAL_CAPSULE | Freq: Three times a day (TID) | ORAL | 2 refills | Status: DC

## 2016-12-11 MED ORDER — HYDROCODONE-ACETAMINOPHEN 5-325 MG PO TABS: 1.0000 | ORAL_TABLET | Freq: Four times a day (QID) | ORAL | 0 refills | Status: DC | PRN

## 2016-12-11 MED ORDER — ORPHENADRINE CITRATE 30 MG/ML IJ SOLN: 60.0000 mg | Freq: Once | INTRAMUSCULAR | 0 refills | Status: AC

## 2016-12-11 MED ORDER — ORPHENADRINE CITRATE 30 MG/ML IJ SOLN
INTRAMUSCULAR | Status: AC
  Filled 2016-12-11: qty 2

## 2016-12-11 MED ORDER — KETOROLAC TROMETHAMINE 60 MG/2ML IM SOLN
60.0000 mg | Freq: Once | INTRAMUSCULAR | Status: AC
  Administered 2016-12-11: 60 mg via INTRAMUSCULAR

## 2016-12-11 MED ORDER — KETOROLAC TROMETHAMINE 60 MG/2ML IM SOLN
INTRAMUSCULAR | Status: AC
  Filled 2016-12-11: qty 2

## 2016-12-11 NOTE — Patient Instructions (Addendum)
GENERAL RISKS AND COMPLICATIONS  What are the risk, side effects and possible complications? Generally speaking, most procedures are safe.  However, with any procedure there are risks, side effects, and the possibility of complications.  The risks and complications are dependent upon the sites that are lesioned, or the type of nerve block to be performed.  The closer the procedure is to the spine, the more serious the risks are.  Great care is taken when placing the radio frequency needles, block needles or lesioning probes, but sometimes complications can occur. 1. Infection: Any time there is an injection through the skin, there is a risk of infection.  This is why sterile conditions are used for these blocks.  There are four possible types of infection. 1. Localized skin infection. 2. Central Nervous System Infection-This can be in the form of Meningitis, which can be deadly. 3. Epidural Infections-This can be in the form of an epidural abscess, which can cause pressure inside of the spine, causing compression of the spinal cord with subsequent paralysis. This would require an emergency surgery to decompress, and there are no guarantees that the patient would recover from the paralysis. 4. Discitis-This is an infection of the intervertebral discs.  It occurs in about 1% of discography procedures.  It is difficult to treat and it may lead to surgery.        2. Pain: the needles have to go through skin and soft tissues, will cause soreness.       3. Damage to internal structures:  The nerves to be lesioned may be near blood vessels or    other nerves which can be potentially damaged.       4. Bleeding: Bleeding is more common if the patient is taking blood thinners such as  aspirin, Coumadin, Ticiid, Plavix, etc., or if he/she have some genetic predisposition  such as hemophilia. Bleeding into the spinal canal can cause compression of the spinal  cord with subsequent paralysis.  This would require an  emergency surgery to  decompress and there are no guarantees that the patient would recover from the  paralysis.       5. Pneumothorax:  Puncturing of a lung is a possibility, every time a needle is introduced in  the area of the chest or upper back.  Pneumothorax refers to free air around the  collapsed lung(s), inside of the thoracic cavity (chest cavity).  Another two possible  complications related to a similar event would include: Hemothorax and Chylothorax.   These are variations of the Pneumothorax, where instead of air around the collapsed  lung(s), you may have blood or chyle, respectively.       6. Spinal headaches: They may occur with any procedures in the area of the spine.       7. Persistent CSF (Cerebro-Spinal Fluid) leakage: This is a rare problem, but may occur  with prolonged intrathecal or epidural catheters either due to the formation of a fistulous  track or a dural tear.       8. Nerve damage: By working so close to the spinal cord, there is always a possibility of  nerve damage, which could be as serious as a permanent spinal cord injury with  paralysis.       9. Death:  Although rare, severe deadly allergic reactions known as "Anaphylactic  reaction" can occur to any of the medications used.      10. Worsening of the symptoms:  We can always make thing worse.    What are the chances of something like this happening? Chances of any of this occuring are extremely low.  By statistics, you have more of a chance of getting killed in a motor vehicle accident: while driving to the hospital than any of the above occurring .  Nevertheless, you should be aware that they are possibilities.  In general, it is similar to taking a shower.  Everybody knows that you can slip, hit your head and get killed.  Does that mean that you should not shower again?  Nevertheless always keep in mind that statistics do not mean anything if you happen to be on the wrong side of them.  Even if a procedure has a 1  (one) in a 1,000,000 (million) chance of going wrong, it you happen to be that one..Also, keep in mind that by statistics, you have more of a chance of having something go wrong when taking medications.  Who should not have this procedure? If you are on a blood thinning medication (e.g. Coumadin, Plavix, see list of "Blood Thinners"), or if you have an active infection going on, you should not have the procedure.  If you are taking any blood thinners, please inform your physician.  How should I prepare for this procedure?  Do not eat or drink anything at least six hours prior to the procedure.  Bring a driver with you .  It cannot be a taxi.  Come accompanied by an adult that can drive you back, and that is strong enough to help you if your legs get weak or numb from the local anesthetic.  Take all of your medicines the morning of the procedure with just enough water to swallow them.  If you have diabetes, make sure that you are scheduled to have your procedure done first thing in the morning, whenever possible.  If you have diabetes, take only half of your insulin dose and notify our nurse that you have done so as soon as you arrive at the clinic.  If you are diabetic, but only take blood sugar pills (oral hypoglycemic), then do not take them on the morning of your procedure.  You may take them after you have had the procedure.  Do not take aspirin or any aspirin-containing medications, at least eleven (11) days prior to the procedure.  They may prolong bleeding.  Wear loose fitting clothing that may be easy to take off and that you would not mind if it got stained with Betadine or blood.  Do not wear any jewelry or perfume  Remove any nail coloring.  It will interfere with some of our monitoring equipment.  NOTE: Remember that this is not meant to be interpreted as a complete list of all possible complications.  Unforeseen problems may occur.  BLOOD THINNERS The following drugs  contain aspirin or other products, which can cause increased bleeding during surgery and should not be taken for 2 weeks prior to and 1 week after surgery.  If you should need take something for relief of minor pain, you may take acetaminophen which is found in Tylenol,m Datril, Anacin-3 and Panadol. It is not blood thinner. The products listed below are.  Do not take any of the products listed below in addition to any listed on your instruction sheet.  A.P.C or A.P.C with Codeine Codeine Phosphate Capsules #3 Ibuprofen Ridaura  ABC compound Congesprin Imuran rimadil  Advil Cope Indocin Robaxisal  Alka-Seltzer Effervescent Pain Reliever and Antacid Coricidin or Coricidin-D  Indomethacin Rufen    Alka-Seltzer plus Cold Medicine Cosprin Ketoprofen S-A-C Tablets  Anacin Analgesic Tablets or Capsules Coumadin Korlgesic Salflex  Anacin Extra Strength Analgesic tablets or capsules CP-2 Tablets Lanoril Salicylate  Anaprox Cuprimine Capsules Levenox Salocol  Anexsia-D Dalteparin Magan Salsalate  Anodynos Darvon compound Magnesium Salicylate Sine-off  Ansaid Dasin Capsules Magsal Sodium Salicylate  Anturane Depen Capsules Marnal Soma  APF Arthritis pain formula Dewitt's Pills Measurin Stanback  Argesic Dia-Gesic Meclofenamic Sulfinpyrazone  Arthritis Bayer Timed Release Aspirin Diclofenac Meclomen Sulindac  Arthritis pain formula Anacin Dicumarol Medipren Supac  Analgesic (Safety coated) Arthralgen Diffunasal Mefanamic Suprofen  Arthritis Strength Bufferin Dihydrocodeine Mepro Compound Suprol  Arthropan liquid Dopirydamole Methcarbomol with Aspirin Synalgos  ASA tablets/Enseals Disalcid Micrainin Tagament  Ascriptin Doan's Midol Talwin  Ascriptin A/D Dolene Mobidin Tanderil  Ascriptin Extra Strength Dolobid Moblgesic Ticlid  Ascriptin with Codeine Doloprin or Doloprin with Codeine Momentum Tolectin  Asperbuf Duoprin Mono-gesic Trendar  Aspergum Duradyne Motrin or Motrin IB Triminicin  Aspirin  plain, buffered or enteric coated Durasal Myochrisine Trigesic  Aspirin Suppositories Easprin Nalfon Trillsate  Aspirin with Codeine Ecotrin Regular or Extra Strength Naprosyn Uracel  Atromid-S Efficin Naproxen Ursinus  Auranofin Capsules Elmiron Neocylate Vanquish  Axotal Emagrin Norgesic Verin  Azathioprine Empirin or Empirin with Codeine Normiflo Vitamin E  Azolid Emprazil Nuprin Voltaren  Bayer Aspirin plain, buffered or children's or timed BC Tablets or powders Encaprin Orgaran Warfarin Sodium  Buff-a-Comp Enoxaparin Orudis Zorpin  Buff-a-Comp with Codeine Equegesic Os-Cal-Gesic   Buffaprin Excedrin plain, buffered or Extra Strength Oxalid   Bufferin Arthritis Strength Feldene Oxphenbutazone   Bufferin plain or Extra Strength Feldene Capsules Oxycodone with Aspirin   Bufferin with Codeine Fenoprofen Fenoprofen Pabalate or Pabalate-SF   Buffets II Flogesic Panagesic   Buffinol plain or Extra Strength Florinal or Florinal with Codeine Panwarfarin   Buf-Tabs Flurbiprofen Penicillamine   Butalbital Compound Four-way cold tablets Penicillin   Butazolidin Fragmin Pepto-Bismol   Carbenicillin Geminisyn Percodan   Carna Arthritis Reliever Geopen Persantine   Carprofen Gold's salt Persistin   Chloramphenicol Goody's Phenylbutazone   Chloromycetin Haltrain Piroxlcam   Clmetidine heparin Plaquenil   Cllnoril Hyco-pap Ponstel   Clofibrate Hydroxy chloroquine Propoxyphen         Before stopping any of these medications, be sure to consult the physician who ordered them.  Some, such as Coumadin (Warfarin) are ordered to prevent or treat serious conditions such as "deep thrombosis", "pumonary embolisms", and other heart problems.  The amount of time that you may need off of the medication may also vary with the medication and the reason for which you were taking it.  If you are taking any of these medications, please make sure you notify your pain physician before you undergo any  procedures.         ____________________________________________________________________________________________  Medication Rules  Applies to: All patients receiving prescriptions (written or electronic).  Pharmacy of record: Pharmacy where electronic prescriptions will be sent. If written prescriptions are taken to a different pharmacy, please inform the nursing staff. The pharmacy listed in the electronic medical record should be the one where you would like electronic prescriptions to be sent.  Prescription refills: Only during scheduled appointments. Applies to both, written and electronic prescriptions.  NOTE: The following applies primarily to controlled substances (Opioid Pain Medications)  Patient's responsibilities: 1. Pain Pills: Bring all pain pills to every appointment (except for procedure appointments). 2. Pill Bottles: Bring pills in original pharmacy bottle. Always bring newest bottle. Bring bottle, even if empty. 3. Medication refills:  You are responsible for knowing and keeping track of what medications you need refilled. The day before your appointment, write a list of all prescriptions that need to be refilled. Bring that list to your appointment and give it to the admitting nurse. Prescriptions will be written only during appointments. If you forget a medication, it will not be "Called in", "Faxed", or "electronically sent". You will need to get another appointment to get these prescribed. 4. Prescription Accuracy: You are responsible for carefully inspecting your prescriptions before leaving our office. Have the discharge nurse carefully go over each prescription with you, before taking them home. Make sure that your name is accurately spelled, that your address is correct. Check the name and dose of your medication to make sure it is accurate. Check the number of pills, and the written instructions to make sure they are clear and accurate. Make sure that you are given  enough medication to last until your next medication refill appointment. 5. Taking Medication: Take medication as prescribed. Never take more pills than instructed. Never take medication more frequently than prescribed. Taking less pills or less frequently is permitted and encouraged, when it comes to controlled substances (written prescriptions).  6. Inform other Doctors: Always inform, all of your healthcare providers, of all the medications you take. 7. Pain Medication from other Providers: You are not allowed to accept any additional pain medication from any other Doctor or Healthcare provider. There are two exceptions to this rule. (see below) In the event that you require additional pain medication, you are responsible for notifying us, as stated below. 8. Medication Agreement: You are responsible for carefully reading and following our Medication Agreement. This must be signed before receiving any prescriptions from our practice. Safely store a copy of your signed Agreement. Violations to the Agreement will result in no further prescriptions. (Additional copies of our Medication Agreement are available upon request.) 9. Laws, Rules, & Regulations: All patients are expected to follow all Federal and [employer], Rules, [employer]. Ignorance of the Laws does not constitute a valid excuse.  Exceptions: There are only two exceptions to the rule of not receiving pain medications from other Healthcare Providers. 1. Exception #1 (Emergencies): In the event of an emergency (i.e.: accident requiring emergency care), you are allowed to receive additional pain medication. However, you are responsible for: As soon as you are able, call our office [phone], at any time of the day or night, and leave a message stating your name, the date and nature of the emergency, and the name and dose of the medication prescribed. In the event that your call is answered by a member of our staff, make sure to  document and save the date, time, and the name of the person that took your information.  2. Exception #2 (Planned Surgery): In the event that you are scheduled by another doctor or dentist to have any type of surgery or procedure, you are allowed (for a period no longer than 30 days), to receive additional pain medication, for the acute post-op pain. However, in this case, you are responsible for picking up a copy of our "Post-op Pain Management for Surgeons" handout, and giving it to your surgeon or dentist. This document is available at our office, and does not require an appointment to obtain it. Simply go to our office during business hours (Monday-Thursday from 8:00 AM to 4:00 PM) (Friday 8:00 AM to 12:00 Noon) or if you have a scheduled appointment with [place],  prior to your surgery, and ask for it by name. In addition, you will need to provide [place] with your name, name of your surgeon, type of surgery, and date of procedure or surgery.  _____________________________________________________________________________________________

## 2016-12-11 NOTE — Progress Notes (Signed)
Nursing Pain Medication Assessment:  Safety precautions to be maintained throughout the outpatient stay will include: orient to surroundings, keep bed in low position, maintain call bell within reach at all times, provide assistance with transfer out of bed and ambulation.  Medication Inspection Compliance: Pill count conducted under aseptic conditions, in front of the patient. Neither the pills nor the bottle was removed from the patient's sight at any time. Once count was completed pills were immediately returned to the patient in their original bottle.  Medication: See above Pill/Patch Count: 92 of 120 pills remain Pill/Patch Appearance: Markings consistent with prescribed medication Bottle Appearance: Standard pharmacy container. Clearly labeled. Filled Date: 5 / 75 / 2018 Last Medication intake:  Today

## 2016-12-13 ENCOUNTER — Other Ambulatory Visit: Payer: Self-pay | Admitting: Nurse Practitioner

## 2016-12-13 DIAGNOSIS — G894 Chronic pain syndrome: Secondary | ICD-10-CM

## 2016-12-13 MED ORDER — ORPHENADRINE CITRATE 30 MG/ML IJ SOLN: 60.0000 mg | Freq: Once | INTRAMUSCULAR | Status: DC

## 2017-01-08 ENCOUNTER — Ambulatory Visit
Admission: RE | Admit: 2017-01-08 | Discharge: 2017-01-08 | Disposition: A | Discharge status: Home / Self Care | Payer: Medicare HMO | Source: Ambulatory Visit | Attending: Pain Medicine | Admitting: Pain Medicine

## 2017-01-08 ENCOUNTER — Ambulatory Visit (HOSPITAL_BASED_OUTPATIENT_CLINIC_OR_DEPARTMENT_OTHER): Payer: Medicare HMO | Admitting: Pain Medicine

## 2017-01-08 ENCOUNTER — Encounter: Payer: Self-pay | Admitting: Pain Medicine

## 2017-01-08 VITALS — BP 112/72 | HR 72 | Temp 97.6°F | Resp 16 | Ht 64.0 in | Wt 240.0 lb

## 2017-01-08 DIAGNOSIS — M4696 Unspecified inflammatory spondylopathy, lumbar region: Secondary | ICD-10-CM | POA: Insufficient documentation

## 2017-01-08 DIAGNOSIS — M47816 Spondylosis without myelopathy or radiculopathy, lumbar region: Secondary | ICD-10-CM

## 2017-01-08 DIAGNOSIS — M5442 Lumbago with sciatica, left side: Secondary | ICD-10-CM | POA: Diagnosis not present

## 2017-01-08 DIAGNOSIS — G8929 Other chronic pain: Secondary | ICD-10-CM | POA: Insufficient documentation

## 2017-01-08 MED ORDER — FENTANYL CITRATE (PF) 100 MCG/2ML IJ SOLN
INTRAMUSCULAR | Status: AC
  Filled 2017-01-08: qty 2

## 2017-01-08 MED ORDER — TRIAMCINOLONE ACETONIDE 40 MG/ML IJ SUSP
40.0000 mg | Freq: Once | INTRAMUSCULAR | Status: AC
  Administered 2017-01-08: 40 mg

## 2017-01-08 MED ORDER — LACTATED RINGERS IV SOLN
1000.0000 mL | Freq: Once | INTRAVENOUS | Status: AC
  Administered 2017-01-08: 1000 mL via INTRAVENOUS

## 2017-01-08 MED ORDER — ROPIVACAINE HCL 2 MG/ML IJ SOLN
INTRAMUSCULAR | Status: AC
  Filled 2017-01-08: qty 20

## 2017-01-08 MED ORDER — MIDAZOLAM HCL 5 MG/5ML IJ SOLN
1.0000 mg | INTRAMUSCULAR | Status: DC | PRN
  Administered 2017-01-08: 2 mg via INTRAVENOUS

## 2017-01-08 MED ORDER — FENTANYL CITRATE (PF) 100 MCG/2ML IJ SOLN
25.0000 ug | INTRAMUSCULAR | Status: DC | PRN
  Administered 2017-01-08: 50 ug via INTRAVENOUS

## 2017-01-08 MED ORDER — LIDOCAINE HCL (PF) 1 % IJ SOLN
INTRAMUSCULAR | Status: AC
  Filled 2017-01-08: qty 5

## 2017-01-08 MED ORDER — MIDAZOLAM HCL 5 MG/5ML IJ SOLN
INTRAMUSCULAR | Status: AC
  Filled 2017-01-08: qty 5

## 2017-01-08 MED ORDER — LIDOCAINE HCL (PF) 1.5 % IJ SOLN: 20.0000 mL | Freq: Once | INTRAMUSCULAR | Status: DC

## 2017-01-08 MED ORDER — LIDOCAINE HCL (PF) 1.5 % IJ SOLN
20.0000 mL | Freq: Once | INTRAMUSCULAR | Status: AC
  Administered 2017-01-08: 20 mL

## 2017-01-08 MED ORDER — TRIAMCINOLONE ACETONIDE 40 MG/ML IJ SUSP
INTRAMUSCULAR | Status: AC
  Filled 2017-01-08: qty 2

## 2017-01-08 MED ORDER — ROPIVACAINE HCL 2 MG/ML IJ SOLN
9.0000 mL | Freq: Once | INTRAMUSCULAR | Status: AC
  Administered 2017-01-08: 9 mL via PERINEURAL

## 2017-01-08 NOTE — Patient Instructions (Addendum)
____________________________________________________________________________________________  Post-Procedure instructions Instructions:  Apply ice: Fill a plastic sandwich bag with crushed ice. Cover it with a small towel and apply to injection site. Apply for 15 minutes then remove x 15 minutes. Repeat sequence on day of procedure, until you go to bed. The purpose is to minimize swelling and discomfort after procedure.  Apply heat: Apply heat to procedure site starting the day following the procedure. The purpose is to treat any soreness and discomfort from the procedure.  Food intake: Start with clear liquids (like water) and advance to regular food, as tolerated.   Physical activities: Keep activities to a minimum for the first 8 hours after the procedure.   Driving: If you have received any sedation, you are not allowed to drive for 24 hours after your procedure.  Blood thinner: Restart your blood thinner 6 hours after your procedure. (Only for those taking blood thinners)  Insulin: As soon as you can eat, you may resume your normal dosing schedule. (Only for those taking insulin)  Infection prevention: Keep procedure site clean and dry.  Post-procedure Pain Diary: Extremely important that this be done correctly and accurately. Recorded information will be used to determine the next step in treatment.  Pain evaluated is that of treated area only. Do not include pain from an untreated area.  Complete every hour, on the hour, for the initial 8 hours. Set an alarm to help you do this part accurately.  Do not go to sleep and have it completed later. It will not be accurate.  Follow-up appointment: Keep your follow-up appointment after the procedure. Usually 2 weeks for most procedures. (6 weeks in the case of radiofrequency.) Bring you pain diary.  Expect:  From numbing medicine (AKA: Local Anesthetics): Numbness or decrease in pain.  Onset: Full effect within 15 minutes of  injected.  Duration: It will depend on the type of local anesthetic used. On the average, 1 to 8 hours.   From steroids: Decrease in swelling or inflammation. Once inflammation is improved, relief of the pain will follow.  Onset of benefits: Depends on the amount of swelling present. The more swelling, the longer it will take for the benefits to be seen. In some cases, up to 10 days.  Duration: Steroids will stay in the system x 2 weeks. Duration of benefits will depend on multiple posibilities including persistent irritating factors.  From procedure: Some discomfort is to be expected once the numbing medicine wears off. This should be minimal if ice and heat are applied as instructed. Call if:  You experience numbness and weakness that gets worse with time, as opposed to wearing off.  New onset bowel or bladder incontinence. (Spinal procedures only)  Emergency Numbers:  Durning business hours (Monday - Thursday, 8:00 AM - 4:00 PM) (Friday, 9:00 AM - 12:00 Noon): [phone]  After hours: [phone] ____________________________________________________________________________________________  Pain Management Discharge Instructions  General Discharge Instructions :  If you need to reach your doctor call: Monday-Friday 8:00 am - 4:00 pm at [phone] or toll free [phone].  After clinic hours [phone] to have operator reach doctor.  Bring all of your medication bottles to all your appointments in the pain clinic.  To cancel or reschedule your appointment with Pain Management please remember to call 24 hours in advance to avoid a fee.  Refer to the educational materials which you have been given on: General Risks, I had my Procedure. Discharge Instructions, Post Sedation.  Post Procedure Instructions:  The drugs you   were given will stay in your system until tomorrow, so for the next 24 hours you should not drive, make any legal decisions or drink any alcoholic  beverages.  You may eat anything you prefer, but it is better to start with liquids then soups and crackers, and gradually work up to solid foods.  Please notify your doctor immediately if you have any unusual bleeding, trouble breathing or pain that is not related to your normal pain.  Depending on the type of procedure that was done, some parts of your body may feel week and/or numb.  This usually clears up by tonight or the next day.  Walk with the use of an assistive device or accompanied by an adult for the 24 hours.  You may use ice on the affected area for the first 24 hours.  Put ice in a Ziploc bag and cover with a towel and place against area 15 minutes on 15 minutes off.  You may switch to heat after 24 hours.GENERAL RISKS AND COMPLICATIONS  What are the risk, side effects and possible complications? Generally speaking, most procedures are safe.  However, with any procedure there are risks, side effects, and the possibility of complications.  The risks and complications are dependent upon the sites that are lesioned, or the type of nerve block to be performed.  The closer the procedure is to the spine, the more serious the risks are.  Great care is taken when placing the radio frequency needles, block needles or lesioning probes, but sometimes complications can occur. 1. Infection: Any time there is an injection through the skin, there is a risk of infection.  This is why sterile conditions are used for these blocks.  There are four possible types of infection. 1. Localized skin infection. 2. Central Nervous System Infection-This can be in the form of Meningitis, which can be deadly. 3. Epidural Infections-This can be in the form of an epidural abscess, which can cause pressure inside of the spine, causing compression of the spinal cord with subsequent paralysis. This would require an emergency surgery to decompress, and there are no guarantees that the patient would recover from the  paralysis. 4. Discitis-This is an infection of the intervertebral discs.  It occurs in about 1% of discography procedures.  It is difficult to treat and it may lead to surgery.        2. Pain: the needles have to go through skin and soft tissues, will cause soreness.       3. Damage to internal structures:  The nerves to be lesioned may be near blood vessels or    other nerves which can be potentially damaged.       4. Bleeding: Bleeding is more common if the patient is taking blood thinners such as  aspirin, Coumadin, Ticiid, Plavix, etc., or if he/she have some genetic predisposition  such as hemophilia. Bleeding into the spinal canal can cause compression of the spinal  cord with subsequent paralysis.  This would require an emergency surgery to  decompress and there are no guarantees that the patient would recover from the  paralysis.       5. Pneumothorax:  Puncturing of a lung is a possibility, every time a needle is introduced in  the area of the chest or upper back.  Pneumothorax refers to free air around the  collapsed lung(s), inside of the thoracic cavity (chest cavity).  Another two possible  complications related to a similar event would include: Hemothorax and   Chylothorax.   These are variations of the Pneumothorax, where instead of air around the collapsed  lung(s), you may have blood or chyle, respectively.       6. Spinal headaches: They may occur with any procedures in the area of the spine.       7. Persistent CSF (Cerebro-Spinal Fluid) leakage: This is a rare problem, but may occur  with prolonged intrathecal or epidural catheters either due to the formation of a fistulous  track or a dural tear.       8. Nerve damage: By working so close to the spinal cord, there is always a possibility of  nerve damage, which could be as serious as a permanent spinal cord injury with  paralysis.       9. Death:  Although rare, severe deadly allergic reactions known as "Anaphylactic  reaction" can  occur to any of the medications used.      10. Worsening of the symptoms:  We can always make thing worse.  What are the chances of something like this happening? Chances of any of this occuring are extremely low.  By statistics, you have more of a chance of getting killed in a motor vehicle accident: while driving to the hospital than any of the above occurring .  Nevertheless, you should be aware that they are possibilities.  In general, it is similar to taking a shower.  Everybody knows that you can slip, hit your head and get killed.  Does that mean that you should not shower again?  Nevertheless always keep in mind that statistics do not mean anything if you happen to be on the wrong side of them.  Even if a procedure has a 1 (one) in a 1,000,000 (million) chance of going wrong, it you happen to be that one..Also, keep in mind that by statistics, you have more of a chance of having something go wrong when taking medications.  Who should not have this procedure? If you are on a blood thinning medication (e.g. Coumadin, Plavix, see list of "Blood Thinners"), or if you have an active infection going on, you should not have the procedure.  If you are taking any blood thinners, please inform your physician.  How should I prepare for this procedure?  Do not eat or drink anything at least six hours prior to the procedure.  Bring a driver with you .  It cannot be a taxi.  Come accompanied by an adult that can drive you back, and that is strong enough to help you if your legs get weak or numb from the local anesthetic.  Take all of your medicines the morning of the procedure with just enough water to swallow them.  If you have diabetes, make sure that you are scheduled to have your procedure done first thing in the morning, whenever possible.  If you have diabetes, take only half of your insulin dose and notify our nurse that you have done so as soon as you arrive at the clinic.  If you are  diabetic, but only take blood sugar pills (oral hypoglycemic), then do not take them on the morning of your procedure.  You may take them after you have had the procedure.  Do not take aspirin or any aspirin-containing medications, at least eleven (11) days prior to the procedure.  They may prolong bleeding.  Wear loose fitting clothing that may be easy to take off and that you would not mind if it got stained with Betadine   or blood.  Do not wear any jewelry or perfume  Remove any nail coloring.  It will interfere with some of our monitoring equipment.  NOTE: Remember that this is not meant to be interpreted as a complete list of all possible complications.  Unforeseen problems may occur.  BLOOD THINNERS The following drugs contain aspirin or other products, which can cause increased bleeding during surgery and should not be taken for 2 weeks prior to and 1 week after surgery.  If you should need take something for relief of minor pain, you may take acetaminophen which is found in Tylenol,m Datril, Anacin-3 and Panadol. It is not blood thinner. The products listed below are.  Do not take any of the products listed below in addition to any listed on your instruction sheet.  A.P.C or A.P.C with Codeine Codeine Phosphate Capsules #3 Ibuprofen Ridaura  ABC compound Congesprin Imuran rimadil  Advil Cope Indocin Robaxisal  Alka-Seltzer Effervescent Pain Reliever and Antacid Coricidin or Coricidin-D  Indomethacin Rufen  Alka-Seltzer plus Cold Medicine Cosprin Ketoprofen S-A-C Tablets  Anacin Analgesic Tablets or Capsules Coumadin Korlgesic Salflex  Anacin Extra Strength Analgesic tablets or capsules CP-2 Tablets Lanoril Salicylate  Anaprox Cuprimine Capsules Levenox Salocol  Anexsia-D Dalteparin Magan Salsalate  Anodynos Darvon compound Magnesium Salicylate Sine-off  Ansaid Dasin Capsules Magsal Sodium Salicylate  Anturane Depen Capsules Marnal Soma  APF Arthritis pain formula Dewitt's Pills  Measurin Stanback  Argesic Dia-Gesic Meclofenamic Sulfinpyrazone  Arthritis Bayer Timed Release Aspirin Diclofenac Meclomen Sulindac  Arthritis pain formula Anacin Dicumarol Medipren Supac  Analgesic (Safety coated) Arthralgen Diffunasal Mefanamic Suprofen  Arthritis Strength Bufferin Dihydrocodeine Mepro Compound Suprol  Arthropan liquid Dopirydamole Methcarbomol with Aspirin Synalgos  ASA tablets/Enseals Disalcid Micrainin Tagament  Ascriptin Doan's Midol Talwin  Ascriptin A/D Dolene Mobidin Tanderil  Ascriptin Extra Strength Dolobid Moblgesic Ticlid  Ascriptin with Codeine Doloprin or Doloprin with Codeine Momentum Tolectin  Asperbuf Duoprin Mono-gesic Trendar  Aspergum Duradyne Motrin or Motrin IB Triminicin  Aspirin plain, buffered or enteric coated Durasal Myochrisine Trigesic  Aspirin Suppositories Easprin Nalfon Trillsate  Aspirin with Codeine Ecotrin Regular or Extra Strength Naprosyn Uracel  Atromid-S Efficin Naproxen Ursinus  Auranofin Capsules Elmiron Neocylate Vanquish  Axotal Emagrin Norgesic Verin  Azathioprine Empirin or Empirin with Codeine Normiflo Vitamin E  Azolid Emprazil Nuprin Voltaren  Bayer Aspirin plain, buffered or children's or timed BC Tablets or powders Encaprin Orgaran Warfarin Sodium  Buff-a-Comp Enoxaparin Orudis Zorpin  Buff-a-Comp with Codeine Equegesic Os-Cal-Gesic   Buffaprin Excedrin plain, buffered or Extra Strength Oxalid   Bufferin Arthritis Strength Feldene Oxphenbutazone   Bufferin plain or Extra Strength Feldene Capsules Oxycodone with Aspirin   Bufferin with Codeine Fenoprofen Fenoprofen Pabalate or Pabalate-SF   Buffets II Flogesic Panagesic   Buffinol plain or Extra Strength Florinal or Florinal with Codeine Panwarfarin   Buf-Tabs Flurbiprofen Penicillamine   Butalbital Compound Four-way cold tablets Penicillin   Butazolidin Fragmin Pepto-Bismol   Carbenicillin Geminisyn Percodan   Carna Arthritis Reliever Geopen Persantine    Carprofen Gold's salt Persistin   Chloramphenicol Goody's Phenylbutazone   Chloromycetin Haltrain Piroxlcam   Clmetidine heparin Plaquenil   Cllnoril Hyco-pap Ponstel   Clofibrate Hydroxy chloroquine Propoxyphen         Before stopping any of these medications, be sure to consult the physician who ordered them.  Some, such as Coumadin (Warfarin) are ordered to prevent or treat serious conditions such as "deep thrombosis", "pumonary embolisms", and other heart problems.  The amount of time that you may need  off of the medication may also vary with the medication and the reason for which you were taking it.  If you are taking any of these medications, please make sure you notify your pain physician before you undergo any procedures.          Facet Joint Block The facet joints connect the bones of the spine (vertebrae). They make it possible for you to bend, twist, and make other movements with your spine. They also keep you from bending too far, twisting too far, and making other excessive movements. A facet joint block is a procedure where a numbing medicine (anesthetic) is injected into a facet joint. Often, a type of anti-inflammatory medicine called a steroid is also injected. A facet joint block may be done to diagnose neck or back pain. If the pain gets better after a facet joint block, it means the pain is probably coming from the facet joint. If the pain does not get better, it means the pain is probably not coming from the facet joint. A facet joint block may also be done to relieve neck or back pain caused by an inflamed facet joint. A facet joint block is only done to relieve pain if the pain does not improve with other methods, such as medicine, exercise programs, and physical therapy. Tell a health care provider about:  Any allergies you have.  All medicines you are taking, including vitamins, herbs, eye drops, creams, and over-the-counter medicines.  Any problems you or  family members have had with anesthetic medicines.  Any blood disorders you have.  Any surgeries you have had.  Any medical conditions you have.  Whether you are pregnant or may be pregnant. What are the risks? Generally, this is a safe procedure. However, problems may occur, including:  Bleeding.  Injury to a nerve near the injection site.  Pain at the injection site.  Weakness or numbness in areas controlled by nerves near the injection site.  Infection.  Temporary fluid retention.  Allergic reactions to medicines or dyes.  Injury to other structures or organs near the injection site.  What happens before the procedure?  Follow instructions from your health care provider about eating or drinking restrictions.  Ask your health care provider about: ? Changing or stopping your regular medicines. This is especially important if you are taking diabetes medicines or blood thinners. ? Taking medicines such as aspirin and ibuprofen. These medicines can thin your blood. Do not take these medicines before your procedure if your health care provider instructs you not to.  Do not take any new dietary supplements or medicines without asking your health care provider first.  Plan to have someone take you home after the procedure. What happens during the procedure?  You may need to remove your clothing and dress in an open-back gown.  The procedure will be done while you are lying on an X-ray table. You will most likely be asked to lie on your stomach, but you may be asked to lie in a different position if an injection will be made in your neck.  Machines will be used to monitor your oxygen levels, heart rate, and blood pressure.  If an injection will be made in your neck, an IV tube will be inserted into one of your veins. Fluids and medicine will flow directly into your body through the IV tube.  The area over the facet joint where the injection will be made will be cleaned with  soap. The surrounding skin  will be covered with clean drapes.  A numbing medicine (local anesthetic) will be applied to your skin. Your skin may sting or burn for a moment.  A video X-ray machine (fluoroscopy) will be used to locate the joint. In some cases, a CT scan may be used.  A contrast dye may be injected into the facet joint area to help locate the joint.  When the joint is located, an anesthetic will be injected into the joint through the needle.  Your health care provider will ask you whether you feel pain relief. If you do feel relief, a steroid may be injected to provide pain relief for a longer period of time. If you do not feel relief or feel only partial relief, additional injections of an anesthetic may be made in other facet joints.  The needle will be removed.  Your skin will be cleaned.  A bandage (dressing) will be applied over each injection site. The procedure may vary among health care providers and hospitals. What happens after the procedure?  You will be observed for 15-30 minutes before being allowed to go home. This information is not intended to replace advice given to you by your health care provider. Make sure you discuss any questions you have with your health care provider. Document Released: 11/29/2006 Document Revised: 08/11/2015 Document Reviewed: 04/05/2015 Elsevier Interactive Patient Education  2018 [place]  Facet Joint Block, Care After Refer to this sheet in the next few weeks. These instructions provide you with information about caring for yourself after your procedure. Your health care provider may also give you more specific instructions. Your treatment has been planned according to current medical practices, but problems sometimes occur. Call your health care provider if you have any problems or questions after your procedure. What can I expect after the procedure? After the procedure, it is common to have:  Some tenderness over the  injection sites for 2 days after the procedure.  A temporary increase in blood sugar if you have diabetes.  Follow these instructions at home:  Keep track of the amount of pain relief you feel and how long it lasts.  Take over-the-counter and prescription medicines only as told by your health care provider. You may need to limit pain medicine within the first 4-6 hours after the procedure.  Remove your bandages (dressings) the morning after the procedure.  For the first 24 hours after the procedure: ? Do not apply heat near or over the injection sites. ? Do not take a bath or soak in water, such as in a pool or lake. ? Do not drive or operate heavy machinery unless approved by your health care provider. ? Avoid activities that require a lot of energy.  If the injection site is tender, try applying ice to the area. To do this: ? Put ice in a plastic bag. ? Place a towel between your skin and the bag. ? Leave the ice on for 20 minutes, 2-3 times a day.  Keep all follow-up visits as told by your health care provider. This is important. Contact a health care provider if:  Fluid is coming from an injection site.  There is significant bleeding or swelling at an injection site.  You have diabetes and your blood sugar is above 180 mg/dL. Get help right away if:  You have a fever.  You have worsening pain or swelling around an injection site.  There are red streaks around an injection site.  You develop severe pain that  is not controlled by your medicines.  You develop a headache, stiff neck, nausea, or vomiting.  Your eyes become very sensitive to light.  You have weakness, paralysis, or tingling in your arms or legs that was not present before the procedure.  You have difficulty urinating or breathing. This information is not intended to replace advice given to you by your health care provider. Make sure you discuss any questions you have with your health care  provider. Document Released: 06/26/2012 Document Revised: 11/24/2015 Document Reviewed: 04/05/2015 Elsevier Interactive Patient Education  [employer].

## 2017-01-08 NOTE — Progress Notes (Signed)
Patient's Name: Wanda [name]  MRN: [id number]  Referring Provider: Milinda Pointer, MD  DOB: Jan 25, 1946  PCP: Colon Branch, MD  DOS: 01/08/2017  Note by: Kathlen Brunswick. Dossie Arbour, MD  Service setting: Ambulatory outpatient  Location: [facility] (AMB) Pain Management Facility  Visit type: Procedure  Specialty: Interventional Pain Management  Patient type: Established   Primary Reason for Visit: Interventional Pain Management Treatment. CC: Back Pain (lower)  Procedure:  Anesthesia, Analgesia, Anxiolysis:  Type: Diagnostic Medial Branch Facet Block Region: Lumbar Level: L2, L3, L4, L5, & S1 Medial Branch Level(s) Laterality: Bilateral  Type: Local Anesthesia with Moderate (Conscious) Sedation Local Anesthetic: Lidocaine 1% Route: Intravenous (IV) IV Access: Secured Sedation: Meaningful verbal contact was maintained at all times during the procedure  Indication(s): Analgesia and Anxiety  Indications: 1. Lumbar facet syndrome (Bilateral) (R>L)   2. Chronic low back pain (Bilateral) (L>R)   3. Lumbar spondylosis    Pain Score: Pre-procedure: 4 /10 Post-procedure: 0-No pain/10  Pre-op Assessment:  Previous date of service: 12/11/16 Service provided: Med Refill [name] is a 71 y.o. (year old), female patient, seen today for interventional treatment. She  has a past surgical history that includes G3 P3 . Her primarily concern today is the Back Pain (lower)  Initial Vital Signs: Blood pressure 137/80, pulse 72, temperature 97.8 F (36.6 C), resp. rate 16, height 5\' 4"  (1.626 m), weight 240 lb (108.9 kg), SpO2 100 %. BMI: 41.20 kg/m  Risk Assessment: Allergies: Reviewed. She is allergic to simvastatin.  Allergy Precautions: None required Coagulopathies: Reviewed. None identified.  Blood-thinner therapy: None at this time Active Infection(s): Reviewed. None identified. [name] is afebrile  Site Confirmation: [name] was asked to confirm the procedure and laterality before  marking the site Procedure checklist: Completed Consent: Before the procedure and under the influence of no sedative(s), amnesic(s), or anxiolytics, the patient was informed of the treatment options, risks and possible complications. To fulfill our ethical and legal obligations, as recommended by the American Medical Association's Code of Ethics, I have informed the patient of my clinical impression; the nature and purpose of the treatment or procedure; the risks, benefits, and possible complications of the intervention; the alternatives, including doing nothing; the risk(s) and benefit(s) of the alternative treatment(s) or procedure(s); and the risk(s) and benefit(s) of doing nothing. The patient was provided information about the general risks and possible complications associated with the procedure. These may include, but are not limited to: failure to achieve desired goals, infection, bleeding, organ or nerve damage, allergic reactions, paralysis, and death. In addition, the patient was informed of those risks and complications associated to Spine-related procedures, such as failure to decrease pain; infection (i.e.: Meningitis, epidural or intraspinal abscess); bleeding (i.e.: epidural hematoma, subarachnoid hemorrhage, or any other type of intraspinal or peri-dural bleeding); organ or nerve damage (i.e.: Any type of peripheral nerve, nerve root, or spinal cord injury) with subsequent damage to sensory, motor, and/or autonomic systems, resulting in permanent pain, numbness, and/or weakness of one or several areas of the body; allergic reactions; (i.e.: anaphylactic reaction); and/or death. Furthermore, the patient was informed of those risks and complications associated with the medications. These include, but are not limited to: allergic reactions (i.e.: anaphylactic or anaphylactoid reaction(s)); adrenal axis suppression; blood sugar elevation that in diabetics may result in ketoacidosis or comma; water  retention that in patients with history of congestive heart failure may result in shortness of breath, pulmonary edema, and decompensation with resultant heart failure; weight gain; swelling  or edema; medication-induced neural toxicity; particulate matter embolism and blood vessel occlusion with resultant organ, and/or nervous system infarction; and/or aseptic necrosis of one or more joints. Finally, the patient was informed that Medicine is not an exact science; therefore, there is also the possibility of unforeseen or unpredictable risks and/or possible complications that may result in a catastrophic outcome. The patient indicated having understood very clearly. We have given the patient no guarantees and we have made no promises. Enough time was given to the patient to ask questions, all of which were answered to the patient's satisfaction. [name] has indicated that she wanted to continue with the procedure. Attestation: I, the ordering provider, attest that I have discussed with the patient the benefits, risks, side-effects, alternatives, likelihood of achieving goals, and potential problems during recovery for the procedure that I have provided informed consent. Date: 01/08/2017; Time: 8:35 AM  Pre-Procedure Preparation:  Monitoring: As per clinic protocol. Respiration, ETCO2, SpO2, BP, heart rate and rhythm monitor placed and checked for adequate function Safety Precautions: Patient was assessed for positional comfort and pressure points before starting the procedure. Time-out: I initiated and conducted the "Time-out" before starting the procedure, as per protocol. The patient was asked to participate by confirming the accuracy of the "Time Out" information. Verification of the correct person, site, and procedure were performed and confirmed by me, the nursing staff, and the patient. "Time-out" conducted as per Joint Commission's Universal Protocol (UP.01.01.01). "Time-out" Date & Time: 01/08/2017;  0912 hrs.  Description of Procedure Process:   Position: Prone Target Area: For Lumbar Facet blocks, the target is the groove formed by the junction of the transverse process and superior articular process. For the L5 dorsal ramus, the target is the notch between superior articular process and sacral ala. For the S1 dorsal ramus, the target is the superior and lateral edge of the posterior S1 Sacral foramen. Approach: Paramedial approach. Area Prepped: Entire Posterior Lumbosacral Region Prepping solution: ChloraPrep (2% chlorhexidine gluconate and 70% isopropyl alcohol) Safety Precautions: Aspiration looking for blood return was conducted prior to all injections. At no point did we inject any substances, as a needle was being advanced. No attempts were made at seeking any paresthesias. Safe injection practices and needle disposal techniques used. Medications properly checked for expiration dates. SDV (single dose vial) medications used. Description of the Procedure: Protocol guidelines were followed. The patient was placed in position over the fluoroscopy table. The target area was identified and the area prepped in the usual manner. Skin desensitized using vapocoolant spray. Skin & deeper tissues infiltrated with local anesthetic. Appropriate amount of time allowed to pass for local anesthetics to take effect. The procedure needle was introduced through the skin, ipsilateral to the reported pain, and advanced to the target area. Employing the "Medial Branch Technique", the needles were advanced to the angle made by the superior and medial portion of the transverse process, and the lateral and inferior portion of the superior articulating process of the targeted vertebral bodies. This area is known as "Burton's Eye" or the "Eye of the [place] Dog". A procedure needle was introduced through the skin, and this time advanced to the angle made by the superior and medial border of the sacral ala, and the  lateral border of the S1 vertebral body. This last needle was later repositioned at the superior and lateral border of the posterior S1 foramen. Negative aspiration confirmed. Solution injected in intermittent fashion, asking for systemic symptoms every 0.5cc of injectate. The needles were  then removed and the area cleansed, making sure to leave some of the prepping solution back to take advantage of its long term bactericidal properties. Vitals:   01/08/17 0922 01/08/17 0932 01/08/17 0942 01/08/17 0952  BP: 137/81 120/75 121/65 112/72  Pulse:      Resp: 17 15 15 16   Temp:  97.6 F (36.4 C)    SpO2: 98% 95% 97% 97%  Weight:      Height:        Start Time: 0912 hrs. End Time: 0922 hrs.  Illustration of the posterior view of the lumbar spine and the posterior neural structures. Laminae of L2 through S1 are labeled. DPRL5, dorsal primary ramus of L5; DPRS1, dorsal primary ramus of S1; DPR3, dorsal primary ramus of L3; FJ, facet (zygapophyseal) joint L3-L4; I, inferior articular process of L4; LB1, lateral branch of dorsal primary ramus of L1; IAB, inferior articular branches from L3 medial branch (supplies L4-L5 facet joint); IBP, intermediate branch plexus; MB3, medial branch of dorsal primary ramus of L3; NR3, third lumbar nerve root; S, superior articular process of L5; SAB, superior articular branches from L4 (supplies L4-5 facet joint also); TP3, transverse process of L3.  Materials:  Needle(s) Type: Regular needle Gauge: 22G Length: 3.5-in Medication(s): We administered lactated ringers, midazolam, fentaNYL, triamcinolone acetonide, lidocaine, triamcinolone acetonide, ropivacaine (PF) 2 mg/mL (0.2%), and ropivacaine (PF) 2 mg/mL (0.2%). Please see chart orders for dosing details.  Imaging Guidance (Spinal):  Type of Imaging Technique: Fluoroscopy Guidance (Spinal) Indication(s): Assistance in needle guidance and placement for procedures requiring needle placement in or near specific  anatomical locations not easily accessible without such assistance. Exposure Time: Please see nurses notes. Contrast: None used. Fluoroscopic Guidance: I was personally present during the use of fluoroscopy. "Tunnel Vision Technique" used to obtain the best possible view of the target area. Parallax error corrected before commencing the procedure. "Direction-depth-direction" technique used to introduce the needle under continuous pulsed fluoroscopy. Once target was reached, antero-posterior, oblique, and lateral fluoroscopic projection used confirm needle placement in all planes. Images permanently stored in EMR. Interpretation: No contrast injected. I personally interpreted the imaging intraoperatively. Adequate needle placement confirmed in multiple planes. Permanent images saved into the patient's record.  Antibiotic Prophylaxis:  Indication(s): None identified Antibiotic given: None  Post-operative Assessment:  EBL: None Complications: No immediate post-treatment complications observed by team, or reported by patient. Note: The patient tolerated the entire procedure well. A repeat set of vitals were taken after the procedure and the patient was kept under observation following institutional policy, for this type of procedure. Post-procedural neurological assessment was performed, showing return to baseline, prior to discharge. The patient was provided with post-procedure discharge instructions, including a section on how to identify potential problems. Should any problems arise concerning this procedure, the patient was given instructions to immediately contact us, at any time, without hesitation. In any case, we plan to contact the patient by telephone for a follow-up status report regarding this interventional procedure. Comments:  No additional relevant information.  Plan of Care  Disposition: Discharge home  Discharge Date & Time: 01/08/2017; 0956 hrs.  Physician-requested Follow-up:    Return for post-procedure eval (in 2 wks).  Future Appointments Date Time Provider [place]  01/18/2017 9:00 AM Milinda Pointer, MD [facility] None  03/13/2017 9:45 AM Vevelyn Francois, NP [facility] None   Medications ordered for procedure: Meds ordered this encounter  Medications  . lactated ringers infusion 1,000 mL  . midazolam (VERSED) 5 MG/5ML injection 1-2  mg    Make sure Flumazenil is available in the pyxis when using this medication. If oversedation occurs, administer 0.2 mg IV over 15 sec. If after 45 sec no response, administer 0.2 mg again over 1 min; may repeat at 1 min intervals; not to exceed 4 doses (1 mg)  . fentaNYL (SUBLIMAZE) injection 25-50 mcg    Make sure Narcan is available in the pyxis when using this medication. In the event of respiratory depression (RR< 8/min): Titrate NARCAN (naloxone) in increments of 0.1 to 0.2 mg IV at 2-3 minute intervals, until desired degree of reversal.  . lidocaine 1.5 % injection 20 mL    From block tray  . triamcinolone acetonide (KENALOG-40) injection 40 mg  . lidocaine 1.5 % injection 20 mL  . triamcinolone acetonide (KENALOG-40) injection 40 mg  . ropivacaine (PF) 2 mg/mL (0.2%) (NAROPIN) injection 9 mL  . ropivacaine (PF) 2 mg/mL (0.2%) (NAROPIN) injection 9 mL   Medications administered: We administered lactated ringers, midazolam, fentaNYL, triamcinolone acetonide, lidocaine, triamcinolone acetonide, ropivacaine (PF) 2 mg/mL (0.2%), and ropivacaine (PF) 2 mg/mL (0.2%).  See the medical record for exact dosing, route, and time of administration.  Lab-work, Procedure(s), & Referral(s) Ordered: Orders Placed This Encounter  Procedures  . LUMBAR FACET(MEDIAL BRANCH NERVE BLOCK) MBNB  . DG C-Arm 1-60 Min-No Report  . Informed Consent Details: Transcribe to consent form and obtain patient signature  . Provider attestation of informed consent for procedure/surgical case  . Verify informed consent  . Discharge  instructions  . Follow-up   Imaging Ordered: New Prescriptions   No medications on file   Primary Care Physician: Colon Branch, MD Location: [facility] Note by: Kathlen Brunswick. Dossie Arbour, M.D, DABA, DABAPM, DABPM, DABIPP, FIPP Date: 01/08/2017; Time: 11:57 AM  Disclaimer:  Medicine is not an exact science. The only guarantee in medicine is that nothing is guaranteed. It is important to note that the decision to proceed with this intervention was based on the information collected from the patient. The Data and conclusions were drawn from the patient's questionnaire, the interview, and the physical examination. Because the information was provided in large part by the patient, it cannot be guaranteed that it has not been purposely or unconsciously manipulated. Every effort has been made to obtain as much relevant data as possible for this evaluation. It is important to note that the conclusions that lead to this procedure are derived in large part from the available data. Always take into account that the treatment will also be dependent on availability of resources and existing treatment guidelines, considered by other Pain Management Practitioners as being common knowledge and practice, at the time of the intervention. For Medico-Legal purposes, it is also important to point out that variation in procedural techniques and pharmacological choices are the acceptable norm. The indications, contraindications, technique, and results of the above procedure should only be interpreted and judged by a Board-Certified Interventional Pain Specialist with extensive familiarity and expertise in the same exact procedure and technique.  Instructions provided at this appointment: Patient Instructions   ____________________________________________________________________________________________  Post-Procedure instructions Instructions:  Apply ice: Fill a plastic sandwich bag with  crushed ice. Cover it with a small towel and apply to injection site. Apply for 15 minutes then remove x 15 minutes. Repeat sequence on day of procedure, until you go to bed. The purpose is to minimize swelling and discomfort after procedure.  Apply heat: Apply heat to procedure site starting the day following the  procedure. The purpose is to treat any soreness and discomfort from the procedure.  Food intake: Start with clear liquids (like water) and advance to regular food, as tolerated.   Physical activities: Keep activities to a minimum for the first 8 hours after the procedure.   Driving: If you have received any sedation, you are not allowed to drive for 24 hours after your procedure.  Blood thinner: Restart your blood thinner 6 hours after your procedure. (Only for those taking blood thinners)  Insulin: As soon as you can eat, you may resume your normal dosing schedule. (Only for those taking insulin)  Infection prevention: Keep procedure site clean and dry.  Post-procedure Pain Diary: Extremely important that this be done correctly and accurately. Recorded information will be used to determine the next step in treatment.  Pain evaluated is that of treated area only. Do not include pain from an untreated area.  Complete every hour, on the hour, for the initial 8 hours. Set an alarm to help you do this part accurately.  Do not go to sleep and have it completed later. It will not be accurate.  Follow-up appointment: Keep your follow-up appointment after the procedure. Usually 2 weeks for most procedures. (6 weeks in the case of radiofrequency.) Bring you pain diary.  Expect:  From numbing medicine (AKA: Local Anesthetics): Numbness or decrease in pain.  Onset: Full effect within 15 minutes of injected.  Duration: It will depend on the type of local anesthetic used. On the average, 1 to 8 hours.   From steroids: Decrease in swelling or inflammation. Once inflammation is improved,  relief of the pain will follow.  Onset of benefits: Depends on the amount of swelling present. The more swelling, the longer it will take for the benefits to be seen. In some cases, up to 10 days.  Duration: Steroids will stay in the system x 2 weeks. Duration of benefits will depend on multiple posibilities including persistent irritating factors.  From procedure: Some discomfort is to be expected once the numbing medicine wears off. This should be minimal if ice and heat are applied as instructed. Call if:  You experience numbness and weakness that gets worse with time, as opposed to wearing off.  New onset bowel or bladder incontinence. (Spinal procedures only)  Emergency Numbers:  Durning business hours (Monday - Thursday, 8:00 AM - 4:00 PM) (Friday, 9:00 AM - 12:00 Noon): [phone]  After hours: [phone] ____________________________________________________________________________________________  Pain Management Discharge Instructions  General Discharge Instructions :  If you need to reach your doctor call: Monday-Friday 8:00 am - 4:00 pm at [phone] or toll free [phone].  After clinic hours [phone] to have operator reach doctor.  Bring all of your medication bottles to all your appointments in the pain clinic.  To cancel or reschedule your appointment with Pain Management please remember to call 24 hours in advance to avoid a fee.  Refer to the educational materials which you have been given on: General Risks, I had my Procedure. Discharge Instructions, Post Sedation.  Post Procedure Instructions:  The drugs you were given will stay in your system until tomorrow, so for the next 24 hours you should not drive, make any legal decisions or drink any alcoholic beverages.  You may eat anything you prefer, but it is better to start with liquids then soups and crackers, and gradually work up to solid foods.  Please notify your doctor immediately if you  have any unusual bleeding, trouble breathing or  pain that is not related to your normal pain.  Depending on the type of procedure that was done, some parts of your body may feel week and/or numb.  This usually clears up by tonight or the next day.  Walk with the use of an assistive device or accompanied by an adult for the 24 hours.  You may use ice on the affected area for the first 24 hours.  Put ice in a Ziploc bag and cover with a towel and place against area 15 minutes on 15 minutes off.  You may switch to heat after 24 hours.GENERAL RISKS AND COMPLICATIONS  What are the risk, side effects and possible complications? Generally speaking, most procedures are safe.  However, with any procedure there are risks, side effects, and the possibility of complications.  The risks and complications are dependent upon the sites that are lesioned, or the type of nerve block to be performed.  The closer the procedure is to the spine, the more serious the risks are.  Great care is taken when placing the radio frequency needles, block needles or lesioning probes, but sometimes complications can occur. 1. Infection: Any time there is an injection through the skin, there is a risk of infection.  This is why sterile conditions are used for these blocks.  There are four possible types of infection. 1. Localized skin infection. 2. Central Nervous System Infection-This can be in the form of Meningitis, which can be deadly. 3. Epidural Infections-This can be in the form of an epidural abscess, which can cause pressure inside of the spine, causing compression of the spinal cord with subsequent paralysis. This would require an emergency surgery to decompress, and there are no guarantees that the patient would recover from the paralysis. 4. Discitis-This is an infection of the intervertebral discs.  It occurs in about 1% of discography procedures.  It is difficult to treat and it may lead to surgery.        2. Pain: the  needles have to go through skin and soft tissues, will cause soreness.       3. Damage to internal structures:  The nerves to be lesioned may be near blood vessels or    other nerves which can be potentially damaged.       4. Bleeding: Bleeding is more common if the patient is taking blood thinners such as  aspirin, Coumadin, Ticiid, Plavix, etc., or if he/she have some genetic predisposition  such as hemophilia. Bleeding into the spinal canal can cause compression of the spinal  cord with subsequent paralysis.  This would require an emergency surgery to  decompress and there are no guarantees that the patient would recover from the  paralysis.       5. Pneumothorax:  Puncturing of a lung is a possibility, every time a needle is introduced in  the area of the chest or upper back.  Pneumothorax refers to free air around the  collapsed lung(s), inside of the thoracic cavity (chest cavity).  Another two possible  complications related to a similar event would include: Hemothorax and Chylothorax.   These are variations of the Pneumothorax, where instead of air around the collapsed  lung(s), you may have blood or chyle, respectively.       6. Spinal headaches: They may occur with any procedures in the area of the spine.       7. Persistent CSF (Cerebro-Spinal Fluid) leakage: This is a rare problem, but may occur  with prolonged intrathecal  or epidural catheters either due to the formation of a fistulous  track or a dural tear.       8. Nerve damage: By working so close to the spinal cord, there is always a possibility of  nerve damage, which could be as serious as a permanent spinal cord injury with  paralysis.       9. Death:  Although rare, severe deadly allergic reactions known as "Anaphylactic  reaction" can occur to any of the medications used.      10. Worsening of the symptoms:  We can always make thing worse.  What are the chances of something like this happening? Chances of any of this occuring are  extremely low.  By statistics, you have more of a chance of getting killed in a motor vehicle accident: while driving to the hospital than any of the above occurring .  Nevertheless, you should be aware that they are possibilities.  In general, it is similar to taking a shower.  Everybody knows that you can slip, hit your head and get killed.  Does that mean that you should not shower again?  Nevertheless always keep in mind that statistics do not mean anything if you happen to be on the wrong side of them.  Even if a procedure has a 1 (one) in a 1,000,000 (million) chance of going wrong, it you happen to be that one..Also, keep in mind that by statistics, you have more of a chance of having something go wrong when taking medications.  Who should not have this procedure? If you are on a blood thinning medication (e.g. Coumadin, Plavix, see list of "Blood Thinners"), or if you have an active infection going on, you should not have the procedure.  If you are taking any blood thinners, please inform your physician.  How should I prepare for this procedure?  Do not eat or drink anything at least six hours prior to the procedure.  Bring a driver with you .  It cannot be a taxi.  Come accompanied by an adult that can drive you back, and that is strong enough to help you if your legs get weak or numb from the local anesthetic.  Take all of your medicines the morning of the procedure with just enough water to swallow them.  If you have diabetes, make sure that you are scheduled to have your procedure done first thing in the morning, whenever possible.  If you have diabetes, take only half of your insulin dose and notify our nurse that you have done so as soon as you arrive at the clinic.  If you are diabetic, but only take blood sugar pills (oral hypoglycemic), then do not take them on the morning of your procedure.  You may take them after you have had the procedure.  Do not take aspirin or any  aspirin-containing medications, at least eleven (11) days prior to the procedure.  They may prolong bleeding.  Wear loose fitting clothing that may be easy to take off and that you would not mind if it got stained with Betadine or blood.  Do not wear any jewelry or perfume  Remove any nail coloring.  It will interfere with some of our monitoring equipment.  NOTE: Remember that this is not meant to be interpreted as a complete list of all possible complications.  Unforeseen problems may occur.  BLOOD THINNERS The following drugs contain aspirin or other products, which can cause increased bleeding during surgery and should not  be taken for 2 weeks prior to and 1 week after surgery.  If you should need take something for relief of minor pain, you may take acetaminophen which is found in Tylenol,m Datril, Anacin-3 and Panadol. It is not blood thinner. The products listed below are.  Do not take any of the products listed below in addition to any listed on your instruction sheet.  A.P.C or A.P.C with Codeine Codeine Phosphate Capsules #3 Ibuprofen Ridaura  ABC compound Congesprin Imuran rimadil  Advil Cope Indocin Robaxisal  Alka-Seltzer Effervescent Pain Reliever and Antacid Coricidin or Coricidin-D  Indomethacin Rufen  Alka-Seltzer plus Cold Medicine Cosprin Ketoprofen S-A-C Tablets  Anacin Analgesic Tablets or Capsules Coumadin Korlgesic Salflex  Anacin Extra Strength Analgesic tablets or capsules CP-2 Tablets Lanoril Salicylate  Anaprox Cuprimine Capsules Levenox Salocol  Anexsia-D Dalteparin Magan Salsalate  Anodynos Darvon compound Magnesium Salicylate Sine-off  Ansaid Dasin Capsules Magsal Sodium Salicylate  Anturane Depen Capsules Marnal Soma  APF Arthritis pain formula Dewitt's Pills Measurin Stanback  Argesic Dia-Gesic Meclofenamic Sulfinpyrazone  Arthritis Bayer Timed Release Aspirin Diclofenac Meclomen Sulindac  Arthritis pain formula Anacin Dicumarol Medipren Supac  Analgesic  (Safety coated) Arthralgen Diffunasal Mefanamic Suprofen  Arthritis Strength Bufferin Dihydrocodeine Mepro Compound Suprol  Arthropan liquid Dopirydamole Methcarbomol with Aspirin Synalgos  ASA tablets/Enseals Disalcid Micrainin Tagament  Ascriptin Doan's Midol Talwin  Ascriptin A/D Dolene Mobidin Tanderil  Ascriptin Extra Strength Dolobid Moblgesic Ticlid  Ascriptin with Codeine Doloprin or Doloprin with Codeine Momentum Tolectin  Asperbuf Duoprin Mono-gesic Trendar  Aspergum Duradyne Motrin or Motrin IB Triminicin  Aspirin plain, buffered or enteric coated Durasal Myochrisine Trigesic  Aspirin Suppositories Easprin Nalfon Trillsate  Aspirin with Codeine Ecotrin Regular or Extra Strength Naprosyn Uracel  Atromid-S Efficin Naproxen Ursinus  Auranofin Capsules Elmiron Neocylate Vanquish  Axotal Emagrin Norgesic Verin  Azathioprine Empirin or Empirin with Codeine Normiflo Vitamin E  Azolid Emprazil Nuprin Voltaren  Bayer Aspirin plain, buffered or children's or timed BC Tablets or powders Encaprin Orgaran Warfarin Sodium  Buff-a-Comp Enoxaparin Orudis Zorpin  Buff-a-Comp with Codeine Equegesic Os-Cal-Gesic   Buffaprin Excedrin plain, buffered or Extra Strength Oxalid   Bufferin Arthritis Strength Feldene Oxphenbutazone   Bufferin plain or Extra Strength Feldene Capsules Oxycodone with Aspirin   Bufferin with Codeine Fenoprofen Fenoprofen Pabalate or Pabalate-SF   Buffets II Flogesic Panagesic   Buffinol plain or Extra Strength Florinal or Florinal with Codeine Panwarfarin   Buf-Tabs Flurbiprofen Penicillamine   Butalbital Compound Four-way cold tablets Penicillin   Butazolidin Fragmin Pepto-Bismol   Carbenicillin Geminisyn Percodan   Carna Arthritis Reliever Geopen Persantine   Carprofen Gold's salt Persistin   Chloramphenicol Goody's Phenylbutazone   Chloromycetin Haltrain Piroxlcam   Clmetidine heparin Plaquenil   Cllnoril Hyco-pap Ponstel   Clofibrate Hydroxy chloroquine  Propoxyphen         Before stopping any of these medications, be sure to consult the physician who ordered them.  Some, such as Coumadin (Warfarin) are ordered to prevent or treat serious conditions such as "deep thrombosis", "pumonary embolisms", and other heart problems.  The amount of time that you may need off of the medication may also vary with the medication and the reason for which you were taking it.  If you are taking any of these medications, please make sure you notify your pain physician before you undergo any procedures.          Facet Joint Block The facet joints connect the bones of the spine (vertebrae). They make it possible for you to  bend, twist, and make other movements with your spine. They also keep you from bending too far, twisting too far, and making other excessive movements. A facet joint block is a procedure where a numbing medicine (anesthetic) is injected into a facet joint. Often, a type of anti-inflammatory medicine called a steroid is also injected. A facet joint block may be done to diagnose neck or back pain. If the pain gets better after a facet joint block, it means the pain is probably coming from the facet joint. If the pain does not get better, it means the pain is probably not coming from the facet joint. A facet joint block may also be done to relieve neck or back pain caused by an inflamed facet joint. A facet joint block is only done to relieve pain if the pain does not improve with other methods, such as medicine, exercise programs, and physical therapy. Tell a health care provider about:  Any allergies you have.  All medicines you are taking, including vitamins, herbs, eye drops, creams, and over-the-counter medicines.  Any problems you or family members have had with anesthetic medicines.  Any blood disorders you have.  Any surgeries you have had.  Any medical conditions you have.  Whether you are pregnant or may be pregnant. What are the  risks? Generally, this is a safe procedure. However, problems may occur, including:  Bleeding.  Injury to a nerve near the injection site.  Pain at the injection site.  Weakness or numbness in areas controlled by nerves near the injection site.  Infection.  Temporary fluid retention.  Allergic reactions to medicines or dyes.  Injury to other structures or organs near the injection site.  What happens before the procedure?  Follow instructions from your health care provider about eating or drinking restrictions.  Ask your health care provider about: ? Changing or stopping your regular medicines. This is especially important if you are taking diabetes medicines or blood thinners. ? Taking medicines such as aspirin and ibuprofen. These medicines can thin your blood. Do not take these medicines before your procedure if your health care provider instructs you not to.  Do not take any new dietary supplements or medicines without asking your health care provider first.  Plan to have someone take you home after the procedure. What happens during the procedure?  You may need to remove your clothing and dress in an open-back gown.  The procedure will be done while you are lying on an X-ray table. You will most likely be asked to lie on your stomach, but you may be asked to lie in a different position if an injection will be made in your neck.  Machines will be used to monitor your oxygen levels, heart rate, and blood pressure.  If an injection will be made in your neck, an IV tube will be inserted into one of your veins. Fluids and medicine will flow directly into your body through the IV tube.  The area over the facet joint where the injection will be made will be cleaned with soap. The surrounding skin will be covered with clean drapes.  A numbing medicine (local anesthetic) will be applied to your skin. Your skin may sting or burn for a moment.  A video X-ray machine  (fluoroscopy) will be used to locate the joint. In some cases, a CT scan may be used.  A contrast dye may be injected into the facet joint area to help locate the joint.  When the joint  is located, an anesthetic will be injected into the joint through the needle.  Your health care provider will ask you whether you feel pain relief. If you do feel relief, a steroid may be injected to provide pain relief for a longer period of time. If you do not feel relief or feel only partial relief, additional injections of an anesthetic may be made in other facet joints.  The needle will be removed.  Your skin will be cleaned.  A bandage (dressing) will be applied over each injection site. The procedure may vary among health care providers and hospitals. What happens after the procedure?  You will be observed for 15-30 minutes before being allowed to go home. This information is not intended to replace advice given to you by your health care provider. Make sure you discuss any questions you have with your health care provider. Document Released: 11/29/2006 Document Revised: 08/11/2015 Document Reviewed: 04/05/2015 Elsevier Interactive Patient Education  2018 [employer]  Facet Joint Block, Care After Refer to this sheet in the next few weeks. These instructions provide you with information about caring for yourself after your procedure. Your health care provider may also give you more specific instructions. Your treatment has been planned according to current medical practices, but problems sometimes occur. Call your health care provider if you have any problems or questions after your procedure. What can I expect after the procedure? After the procedure, it is common to have:  Some tenderness over the injection sites for 2 days after the procedure.  A temporary increase in blood sugar if you have diabetes.  Follow these instructions at home:  Keep track of the amount of pain relief you feel and  how long it lasts.  Take over-the-counter and prescription medicines only as told by your health care provider. You may need to limit pain medicine within the first 4-6 hours after the procedure.  Remove your bandages (dressings) the morning after the procedure.  For the first 24 hours after the procedure: ? Do not apply heat near or over the injection sites. ? Do not take a bath or soak in water, such as in a pool or lake. ? Do not drive or operate heavy machinery unless approved by your health care provider. ? Avoid activities that require a lot of energy.  If the injection site is tender, try applying ice to the area. To do this: ? Put ice in a plastic bag. ? Place a towel between your skin and the bag. ? Leave the ice on for 20 minutes, 2-3 times a day.  Keep all follow-up visits as told by your health care provider. This is important. Contact a health care provider if:  Fluid is coming from an injection site.  There is significant bleeding or swelling at an injection site.  You have diabetes and your blood sugar is above 180 mg/dL. Get help right away if:  You have a fever.  You have worsening pain or swelling around an injection site.  There are red streaks around an injection site.  You develop severe pain that is not controlled by your medicines.  You develop a headache, stiff neck, nausea, or vomiting.  Your eyes become very sensitive to light.  You have weakness, paralysis, or tingling in your arms or legs that was not present before the procedure.  You have difficulty urinating or breathing. This information is not intended to replace advice given to you by your health care provider. Make sure you discuss  any questions you have with your health care provider. Document Released: 06/26/2012 Document Revised: 11/24/2015 Document Reviewed: 04/05/2015 Elsevier Interactive Patient Education  [employer].

## 2017-01-08 NOTE — Progress Notes (Signed)
Safety precautions to be maintained throughout the outpatient stay will include: orient to surroundings, keep bed in low position, maintain call bell within reach at all times, provide assistance with transfer out of bed and ambulation.  

## 2017-01-09 ENCOUNTER — Telehealth: Payer: Self-pay | Admitting: *Deleted

## 2017-01-09 NOTE — Telephone Encounter (Signed)
Denies complications post procedure. 

## 2017-01-18 ENCOUNTER — Ambulatory Visit: Payer: Medicare HMO | Attending: Pain Medicine | Admitting: Pain Medicine

## 2017-01-18 ENCOUNTER — Encounter: Payer: Self-pay | Admitting: Pain Medicine

## 2017-01-18 VITALS — BP 130/73 | HR 63 | Temp 97.8°F | Resp 16 | Ht 65.0 in | Wt 240.0 lb

## 2017-01-18 DIAGNOSIS — M25561 Pain in right knee: Secondary | ICD-10-CM

## 2017-01-18 DIAGNOSIS — Z79891 Long term (current) use of opiate analgesic: Secondary | ICD-10-CM | POA: Diagnosis not present

## 2017-01-18 DIAGNOSIS — G8929 Other chronic pain: Secondary | ICD-10-CM

## 2017-01-18 DIAGNOSIS — M4696 Unspecified inflammatory spondylopathy, lumbar region: Secondary | ICD-10-CM

## 2017-01-18 DIAGNOSIS — M47816 Spondylosis without myelopathy or radiculopathy, lumbar region: Secondary | ICD-10-CM

## 2017-01-18 DIAGNOSIS — M25562 Pain in left knee: Secondary | ICD-10-CM | POA: Diagnosis not present

## 2017-01-18 DIAGNOSIS — M170 Bilateral primary osteoarthritis of knee: Secondary | ICD-10-CM

## 2017-01-18 DIAGNOSIS — G894 Chronic pain syndrome: Secondary | ICD-10-CM | POA: Diagnosis not present

## 2017-01-18 NOTE — Progress Notes (Signed)
Patient's Name: AVALEEN BROWNLEY  MRN: [id number]  Referring Provider: Colon Branch, MD  DOB: 05-06-1946  PCP: Colon Branch, MD  DOS: 01/18/2017  Note by: Kathlen Brunswick. Dossie Arbour, MD  Service setting: Ambulatory outpatient  Specialty: Interventional Pain Management  Location: [facility] (AMB) Pain Management Facility    Patient type: Established   Primary Reason(s) for Visit: Encounter for post-procedure evaluation of chronic illness with mild to moderate exacerbation CC: No chief complaint on file.  HPI  Ms. Toole is a 71 y.o. year old, female patient, who comes today for a post-procedure evaluation. 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, L3-4, L4-5, 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 Her primarily concern today is the No chief complaint on file.  Pain Assessment: Location: Lower Back Radiating: has gone down the legs but no pain  today Onset: More than a month ago Duration: Chronic pain Quality: Aching, Constant, Radiating Severity: 0-No pain/10 (self-reported pain score)  Note: Reported level is compatible with observation.                   Effect on ADL: pace self Timing: Constant Modifying factors: procedure and medicine  Ms. Carlile comes in today for post-procedure evaluation after the treatment done on 01/08/2017.  Further details on both, my assessment(s), as well as the proposed treatment plan, please see below.  Post-Procedure Assessment  01/08/2017 Procedure: Diagnostic bilateral lumbar facet block under fluoroscopic guidance and IV sedation Pre-procedure pain score:  4/10 Post-procedure pain score: 0/10         Influential Factors: BMI: 39.94 kg/m Intra-procedural challenges: None observed Assessment challenges: None detected         Post-procedural adverse reactions or complications: None reported Reported side-effects: None  Sedation: Sedation provided. When no sedatives are used, the analgesic levels obtained are directly associated to the effectiveness of the local anesthetics. However, when sedation is provided, the level of analgesia obtained during the initial 1 hour following the intervention, is believed to be the result of a combination of factors. These factors may include, but are not limited to: 1. The effectiveness of the local anesthetics used. 2. The effects of the analgesic(s) and/or anxiolytic(s) used. 3. The degree of discomfort experienced by the patient at the time of the procedure. 4. The patients ability and reliability in recalling and recording the events. 5. The presence and influence of possible secondary gains and/or psychosocial factors. Reported result: Relief experienced during the 1st hour after the procedure: 100 % (Ultra-Short Term Relief) Interpretative annotation: Analgesia during this period is likely to be Local Anesthetic and/or IV Sedative  (Analgesic/Anxiolitic) related.  Effects of local anesthetic: The analgesic effects attained during this period are directly associated to the localized infiltration of local anesthetics and therefore cary significant diagnostic value as to the etiological location, or anatomical origin, of the pain. Expected duration of relief is directly dependent on the pharmacodynamics of the local anesthetic used. Long-acting (4-6 hours) anesthetics used.  Reported result: Relief during the next 4 to 6 hour after the procedure: 100 % (Short-Term Relief) Interpretative annotation: Complete relief would suggest area to be the source of the pain.          Long-term benefit: Defined as the period of time past the expected duration of local anesthetics. With the possible exception of prolonged sympathetic blockade from the local anesthetics, benefits during this period are typically attributed to, or associated with, other factors such as analgesic sensory neuropraxia, antiinflammatory effects, or beneficial biochemical changes provided by agents other than the local anesthetics Reported result: Extended relief following procedure: 80 % (medication is helping with the pain) (Long-Term Relief) Interpretative annotation: Good relief. This could suggest inflammation to be a significant component in the etiology to the pain.          Current benefits: Defined as persistent relief that continues at this point in time.   Reported results: Treated area: 80 %       Interpretative annotation: Ongoing benefits would suggest effective therapeutic approach  Interpretation: Results would suggest a successful diagnostic intervention.          Laboratory Chemistry  Inflammation Markers Lab Results  Component Value Date   CRP <0.5 08/18/2015   ESRSEDRATE 28 08/18/2015   (CRP: Acute Phase) (ESR: Chronic Phase) Renal Function Markers Lab Results  Component Value Date   BUN 10 08/30/2016   CREATININE 0.65 08/30/2016    GFRAA >60 08/18/2015   GFRNONAA >60 08/18/2015   Hepatic Function Markers Lab Results  Component Value Date   AST 19 08/30/2016   ALT 19 08/30/2016   ALBUMIN 4.2 08/30/2016   ALKPHOS 74 08/30/2016   HCVAB NEGATIVE 08/30/2016   Electrolytes Lab Results  Component Value Date   NA 136 08/30/2016   K 4.2 08/30/2016   CL 102 08/30/2016   CALCIUM 9.8 08/30/2016   MG 2.0 08/18/2015   Neuropathy Markers No results found for: [id number] Bone Pathology Markers Lab Results  Component Value Date   ALKPHOS 74 08/30/2016   VD25OH 37 05/01/2011   VD125OH2TOT 58 02/01/2015   [id number] 58 02/01/2015   VD2125OH2 <8 02/01/2015   CALCIUM 9.8 08/30/2016   Coagulation Parameters Lab Results  Component Value Date   PLT 176.0 08/30/2016   Cardiovascular Markers Lab Results  Component Value Date   HGB 13.8 08/30/2016   HCT 40.9 08/30/2016   Note: Lab results reviewed.  Recent Diagnostic Imaging Review  Dg C-arm 1-60 Min-no Report  Result Date: 01/08/2017 Fluoroscopy was utilized by the requesting physician.  No radiographic interpretation.   Note: Imaging results reviewed.          Meds     Current Outpatient Prescriptions (Cardiovascular):  .  atorvastatin (LIPITOR) 10 MG tablet, Take 1 tablet (10 mg total) by mouth at bedtime. .  hydrochlorothiazide (HYDRODIURIL) 25 MG tablet, Take 1 tablet (25 mg total) by mouth daily. [name]  lisinopril (PRINIVIL,ZESTRIL) 10 MG tablet, Take 1 tablet (10 mg total) by mouth daily.     Current Outpatient Prescriptions (Analgesics):  .  aspirin EC 81 MG tablet, Take 81 mg by mouth daily. Derrill Memo ON  02/14/2017] HYDROcodone-acetaminophen (NORCO/VICODIN) 5-325 MG tablet, Take 1 tablet by mouth every 6 (six) hours as needed for severe pain. [name]  HYDROcodone-acetaminophen (NORCO/VICODIN) 5-325 MG tablet, Take 1 tablet by mouth every 6 (six) hours as needed for severe pain. [name]  HYDROcodone-acetaminophen (NORCO/VICODIN) 5-325 MG tablet, hydrocodone 5  mg-acetaminophen 325 mg tablet .  HYDROcodone-acetaminophen (NORCO/VICODIN) 5-325 MG tablet, Take 1 tablet by mouth every 6 (six) hours as needed for severe pain.     Current Outpatient Prescriptions (Other):  [name]  Cholecalciferol (VITAMIN D) 2000 UNITS tablet, Take 2,000 Units by mouth daily. .  cyclobenzaprine (FLEXERIL) 10 MG tablet, cyclobenzaprine 10 mg tablet .  gabapentin (NEURONTIN) 300 MG capsule, Take 1 capsule (300 mg total) by mouth 3 (three) times daily. .  magnesium oxide (MAG-OX) 400 (241.3 Mg) MG tablet, magnesium oxide 400 mg tablet .  sertraline (ZOLOFT) 50 MG tablet, Take 3 tablets (150 mg total) by mouth daily. (Patient taking differently: Take 100 mg by mouth daily. ) .  zolpidem (AMBIEN) 5 MG tablet, Take 1 tablet (5 mg total) by mouth at bedtime as needed for sleep. .  cyclobenzaprine (FLEXERIL) 10 MG tablet, Take 1 tablet (10 mg total) by mouth 2 (two) times daily as needed for muscle spasms. .  magnesium oxide (MAG-OX) 400 MG tablet, Take 1 tablet (400 mg total) by mouth daily.   Facility-Administered Medications Ordered in Other Visits (Other):  .  orphenadrine (NORFLEX) injection 60 mg No current facility-administered medications for this visit.   ROS  Constitutional: Denies any fever or chills Gastrointestinal: No reported hemesis, hematochezia, vomiting, or acute GI distress Musculoskeletal: Denies any acute onset joint swelling, redness, loss of ROM, or weakness Neurological: No reported episodes of acute onset apraxia, aphasia, dysarthria, agnosia, amnesia, paralysis, loss of coordination, or loss of consciousness  Allergies  Ms. Mccaskey is allergic to simvastatin.  [place]  Drug: Ms. Vu  reports that she does not use drugs. Alcohol:  reports that she does not drink alcohol. Tobacco:  reports that she has never smoked. She has never used smokeless tobacco. Medical:  has a past medical history of Allergy; Chronic back pain; Depression; DJD (degenerative  joint disease); Hyperlipidemia; Hypertension; Menopause; and TMJ PAIN (05/17/2010). Surgical: Ms. Scheidegger  has a past surgical history that includes G3 P3 . Family: family history includes Breast cancer in her sister; Cancer in her brother and sister; Colon cancer in her brother.  Constitutional Exam  General appearance: Well nourished, well developed, and well hydrated. In no apparent acute distress Vitals:   01/18/17 0908  BP: 130/73  Pulse: 63  Resp: 16  Temp: 97.8 F (36.6 C)  SpO2: 97%  Weight: 240 lb (108.9 kg)  Height: 5' 5" (1.651 m)   BMI Assessment: Estimated body mass index is 39.94 kg/m as calculated from the following:   Height as of this encounter: 5' 5" (1.651 m).   Weight as of this encounter: 240 lb (108.9 kg).  BMI interpretation table: BMI level Category Range association with higher incidence of chronic pain  <18 kg/m2 Underweight   18.5-24.9 kg/m2 Ideal body weight   25-29.9 kg/m2 Overweight Increased incidence by 20%  30-34.9 kg/m2 Obese (Class I) Increased incidence by 68%  35-39.9 kg/m2 Severe obesity (Class II) Increased incidence by 136%  >40 kg/m2 Extreme obesity (Class III) Increased incidence by 254%   BMI Readings from Last 4 Encounters:  01/18/17 39.94 kg/m  01/08/17 41.20 kg/m  12/11/16 39.94 kg/m  10/31/16 41.20 kg/m   Wt Readings  from Last 4 Encounters:  01/18/17 240 lb (108.9 kg)  01/08/17 240 lb (108.9 kg)  12/11/16 240 lb (108.9 kg)  10/31/16 240 lb (108.9 kg)  Psych/Mental status: Alert, oriented x 3 (person, place, & time)       Eyes: PERLA Respiratory: No evidence of acute respiratory distress  Cervical Spine Exam  Inspection: No masses, redness, or swelling Alignment: Symmetrical Functional ROM: Unrestricted ROM      Stability: No instability detected Muscle strength & Tone: Functionally intact Sensory: Unimpaired Palpation: No palpable anomalies              Upper Extremity (UE) Exam    Side: Right upper extremity   Side: Left upper extremity  Inspection: No masses, redness, swelling, or asymmetry. No contractures  Inspection: No masses, redness, swelling, or asymmetry. No contractures  Functional ROM: Unrestricted ROM          Functional ROM: Unrestricted ROM          Muscle strength & Tone: Functionally intact  Muscle strength & Tone: Functionally intact  Sensory: Unimpaired  Sensory: Unimpaired  Palpation: No palpable anomalies              Palpation: No palpable anomalies              Specialized Test(s): Deferred         Specialized Test(s): Deferred          Thoracic Spine Exam  Inspection: No masses, redness, or swelling Alignment: Symmetrical Functional ROM: Unrestricted ROM Stability: No instability detected Sensory: Unimpaired Muscle strength & Tone: No palpable anomalies  Lumbar Spine Exam  Inspection: No masses, redness, or swelling Alignment: Symmetrical Functional ROM: Unrestricted ROM      Stability: No instability detected Muscle strength & Tone: Functionally intact Sensory: Unimpaired Palpation: No palpable anomalies       Provocative Tests: Lumbar Hyperextension and rotation test: evaluation deferred today       Patrick's Maneuver: evaluation deferred today                    Gait & Posture Assessment  Ambulation: Unassisted Gait: Relatively normal for age and body habitus Posture: WNL   Lower Extremity Exam    Side: Right lower extremity  Side: Left lower extremity  Inspection: No masses, redness, swelling, or asymmetry. No contractures  Inspection: No masses, redness, swelling, or asymmetry. No contractures  Functional ROM: Unrestricted ROM          Functional ROM: Unrestricted ROM          Muscle strength & Tone: Functionally intact  Muscle strength & Tone: Functionally intact  Sensory: Unimpaired  Sensory: Unimpaired  Palpation: No palpable anomalies  Palpation: No palpable anomalies   Assessment  Primary Diagnosis & Pertinent Problem List: The primary encounter  diagnosis was Chronic pain of both knees. Diagnoses of Primary osteoarthritis of both knees and Lumbar facet syndrome (Bilateral) (R>L) were also pertinent to this visit.  Status Diagnosis  Controlled Controlled Controlled 1. Chronic pain of both knees   2. Primary osteoarthritis of both knees   3. Lumbar facet syndrome (Bilateral) (R>L)     Problems updated and reviewed during this visit: No problems updated. Plan of Care  Pharmacotherapy (Medications Ordered): No orders of the defined types were placed in this encounter.  New Prescriptions   No medications on file   Medications administered today: Ms. Hustead had no medications administered during this visit.  Lab-work, procedure(s), and/or referral(s): Orders Placed  This Encounter  Procedures  . KNEE INJECTION    Interventional therapies: Planned, scheduled, and/or pending:   Diagnostic left intra-articular knee injection with local + steroids.   Considering:   Diagnostic/palliative left intra-articular knee injection.  Diagnostic bilateral lumbar facet block Left L4-5 lumbar epiduralsteroid injection   Palliative PRN treatment(s):   Palliative bilateral lumbar facet block Left L4-5 lumbar epiduralsteroid injection Palliative left intra-articular knee injection.    Provider-requested follow-up: Return for Med-Mgmt, w/ NP, in addition, NS procedure, (ASAP), by MD.  Future Appointments Date Time Provider Trooper  03/13/2017 9:45 AM Vevelyn Francois, NP [facility] None   Primary Care Physician: Colon Branch, MD Location: [facility] Facility Note by: Kathlen Brunswick. Dossie Arbour, M.D, DABA, DABAPM, DABPM, DABIPP, FIPP Date: 01/18/2017; Time: 10:10 AM  Patient Instructions  ____________________________________________________________________________________________  Preparing for Procedure with Sedation Instructions: . Oral Intake: Do not eat or drink anything for at least 8 hours prior to  your procedure. . Transportation: Public transportation is not allowed. Bring an adult driver. The driver must be physically present in our waiting room before any procedure can be started. [name] Physical Assistance: Bring an adult physically capable of assisting you, in the event you need help. This adult should keep you company at home for at least 6 hours after the procedure. . Blood Pressure Medicine: Take your blood pressure medicine with a sip of water the morning of the procedure. . Blood thinners:  . Diabetics on insulin: Notify the staff so that you can be scheduled 1st case in the morning. If your diabetes requires high dose insulin, take only  of your normal insulin dose the morning of the procedure and notify the staff that you have done so. . Preventing infections: Shower with an antibacterial soap the morning of your procedure. . Build-up your immune system: Take 1000 mg of Vitamin C with every meal (3 times a day) the day prior to your procedure. [name] Antibiotics: Inform the staff if you have a condition or reason that requires you to take antibiotics before dental procedures. . Pregnancy: If you are pregnant, call and cancel the procedure. . Sickness: If you have a cold, fever, or any active infections, call and cancel the procedure. . Arrival: You must be in the facility at least 30 minutes prior to your scheduled procedure. . Children: Do not bring children with you. . Dress appropriately: Bring dark clothing that you would not mind if they get stained. . Valuables: Do not bring any jewelry or valuables. Procedure appointments are reserved for interventional treatments only. [name] No Prescription Refills. . No medication changes will be discussed during procedure appointments. . No disability issues will be discussed. ____________________________________________________________________________________________  Radiofrequency Lesioning Radiofrequency lesioning is a procedure that is  performed to relieve pain. The procedure is often used for back, neck, or arm pain. Radiofrequency lesioning involves the use of a machine that creates radio waves to make heat. During the procedure, the heat is applied to the nerve that carries the pain signal. The heat damages the nerve and interferes with the pain signal. Pain relief usually starts about 2 weeks after the procedure and lasts for 6 months to 1 year. Tell a health care provider about:  Any allergies you have.  All medicines you are taking, including vitamins, herbs, eye drops, creams, and over-the-counter medicines.  Any problems you or family members have had with anesthetic medicines.  Any blood disorders you have.  Any surgeries you have had.  Any  medical conditions you have.  Whether you are pregnant or may be pregnant. What are the risks? Generally, this is a safe procedure. However, problems may occur, including:  Pain or soreness at the injection site.  Infection at the injection site.  Damage to nerves or blood vessels.  What happens before the procedure?  Ask your health care provider about: ? Changing or stopping your regular medicines. This is especially important if you are taking diabetes medicines or blood thinners. ? Taking medicines such as aspirin and ibuprofen. These medicines can thin your blood. Do not take these medicines before your procedure if your health care provider instructs you not to.  Follow instructions from your health care provider about eating or drinking restrictions.  Plan to have someone take you home after the procedure.  If you go home right after the procedure, plan to have someone with you for 24 hours. What happens during the procedure?  You will be given one or more of the following: ? A medicine to help you relax (sedative). ? A medicine to numb the area (local anesthetic).  You will be awake during the procedure. You will need to be able to talk with the health  care provider during the procedure.  With the help of a type of X-ray (fluoroscopy), the health care provider will insert a radiofrequency needle into the area to be treated.  Next, a wire that carries the radio waves (electrode) will be put through the radiofrequency needle. An electrical pulse will be sent through the electrode to verify the correct nerve. You will feel a tingling sensation, and you may have muscle twitching.  Then, the tissue that is around the needle tip will be heated by an electric current that is passed using the radiofrequency machine. This will numb the nerves.  A bandage (dressing) will be put on the insertion area after the procedure is done. The procedure may vary among health care providers and hospitals. What happens after the procedure?  Your blood pressure, heart rate, breathing rate, and blood oxygen level will be monitored often until the medicines you were given have worn off.  Return to your normal activities as directed by your health care provider. This information is not intended to replace advice given to you by your health care provider. Make sure you discuss any questions you have with your health care provider. Document Released: 03/08/2011 Document Revised: 12/16/2015 Document Reviewed: 08/17/2014 Elsevier Interactive Patient Education  2018 [employer] Facet Blocks Patient Information  Description: The facets are joints in the spine between the vertebrae.  Like any joints in the body, facets can become irritated and painful.  Arthritis can also effect the facets.  By injecting steroids and local anesthetic in and around these joints, we can temporarily block the nerve supply to them.  Steroids act directly on irritated nerves and tissues to reduce selling and inflammation which often leads to decreased pain.  Facet blocks may be done anywhere along the spine from the neck to the low back depending upon the location of your pain.   After numbing  the skin with local anesthetic (like Novocaine), a small needle is passed onto the facet joints under x-ray guidance.  You may experience a sensation of pressure while this is being done.  The entire block usually lasts about 15-25 minutes.   Conditions which may be treated by facet blocks:   Low back/buttock pain  Neck/shoulder pain  Certain types of headaches  Preparation for the injection:  1. Do not eat any solid food or dairy products within 8 hours of your appointment. 2. You may drink clear liquid up to 3 hours before appointment.  Clear liquids include water, black coffee, juice or soda.  No milk or cream please. 3. You may take your regular medication, including pain medications, with a sip of water before your appointment.  Diabetics should hold regular insulin (if taken separately) and take 1/2 normal NPH dose the morning of the procedure.  Carry some sugar containing items with you to your appointment. 4. A driver must accompany you and be prepared to drive you home after your procedure. 5. Bring all your current medications with you. 6. An IV may be inserted and sedation may be given at the discretion of the physician. 7. A blood pressure cuff, EKG and other monitors will often be applied during the procedure.  Some patients may need to have extra oxygen administered for a short period. 8. You will be asked to provide medical information, including your allergies and medications, prior to the procedure.  We must know immediately if you are taking blood thinners (like Coumadin/Warfarin) or if you are allergic to IV iodine contrast (dye).  We must know if you could possible be pregnant.  Possible side-effects:   Bleeding from needle site  Infection (rare, may require surgery)  Nerve injury (rare)  Numbness & tingling (temporary)  Difficulty urinating (rare, temporary)  Spinal headache (a headache worse with upright posture)  Light-headedness (temporary)  Pain at  injection site (serveral days)  Decreased blood pressure (rare, temporary)  Weakness in arm/leg (temporary)  Pressure sensation in back/neck (temporary)   Call if you experience:   Fever/chills associated with headache or increased back/neck pain  Headache worsened by an upright position  New onset, weakness or numbness of an extremity below the injection site  Hives or difficulty breathing (go to the emergency room)  Inflammation or drainage at the injection site(s)  Severe back/neck pain greater than usual  New symptoms which are concerning to you  Please note:  Although the local anesthetic injected can often make your back or neck feel good for several hours after the injection, the pain will likely return. It takes 3-7 days for steroids to work.  You may not notice any pain relief for at least one week.  If effective, we will often do a series of 2-3 injections spaced 3-6 weeks apart to maximally decrease your pain.  After the initial series, you may be a candidate for a more permanent nerve block of the facets.  If you have any questions, please call #[phone]) [place] for your procedure (without sedation) Instructions: . Oral Intake: Do not eat or drink anything for at least 3 hours prior to your procedure. . Transportation: Unless otherwise stated by your physician, you may drive yourself after the procedure. . Blood Pressure Medicine: Take your blood pressure medicine with a sip of water the morning of the procedure. . Insulin: Take only  of your normal insulin dose. . Preventing infections: Shower with an antibacterial soap the morning of your procedure. . Build-up your immune system: Take 1000 mg of Vitamin C with every meal (3 times a day) the day prior to your procedure. . Pregnancy: If you are pregnant, call and cancel the procedure. . Sickness: If you have a cold, fever, or any active infections, call and  cancel the procedure. . Arrival: You must be in the facility at least  30 minutes prior to your scheduled procedure. . Children: Do not bring any children with you. . Dress appropriately: Bring dark clothing that you would not mind if they get stained. . Valuables: Do not bring any jewelry or valuables. Procedure appointments are reserved for interventional treatments only. [name] No Prescription Refills. . No medication changes will be discussed during procedure appointments. No disability issues will be discussed.

## 2017-01-18 NOTE — Patient Instructions (Addendum)
____________________________________________________________________________________________  Preparing for Procedure with Sedation Instructions: . Oral Intake: Do not eat or drink anything for at least 8 hours prior to your procedure. . Transportation: Public transportation is not allowed. Bring an adult driver. The driver must be physically present in our waiting room before any procedure can be started. . Physical Assistance: Bring an adult physically capable of assisting you, in the event you need help. This adult should keep you company at home for at least 6 hours after the procedure. . Blood Pressure Medicine: Take your blood pressure medicine with a sip of water the morning of the procedure. . Blood thinners:  . Diabetics on insulin: Notify the staff so that you can be scheduled 1st case in the morning. If your diabetes requires high dose insulin, take only  of your normal insulin dose the morning of the procedure and notify the staff that you have done so. . Preventing infections: Shower with an antibacterial soap the morning of your procedure. . Build-up your immune system: Take 1000 mg of Vitamin C with every meal (3 times a day) the day prior to your procedure. . Antibiotics: Inform the staff if you have a condition or reason that requires you to take antibiotics before dental procedures. . Pregnancy: If you are pregnant, call and cancel the procedure. . Sickness: If you have a cold, fever, or any active infections, call and cancel the procedure. . Arrival: You must be in the facility at least 30 minutes prior to your scheduled procedure. . Children: Do not bring children with you. . Dress appropriately: Bring dark clothing that you would not mind if they get stained. . Valuables: Do not bring any jewelry or valuables. Procedure appointments are reserved for interventional treatments only. . No Prescription Refills. . No medication changes will be discussed during procedure  appointments. . No disability issues will be discussed. ____________________________________________________________________________________________  Radiofrequency Lesioning Radiofrequency lesioning is a procedure that is performed to relieve pain. The procedure is often used for back, neck, or arm pain. Radiofrequency lesioning involves the use of a machine that creates radio waves to make heat. During the procedure, the heat is applied to the nerve that carries the pain signal. The heat damages the nerve and interferes with the pain signal. Pain relief usually starts about 2 weeks after the procedure and lasts for 6 months to 1 year. Tell a health care provider about:  Any allergies you have.  All medicines you are taking, including vitamins, herbs, eye drops, creams, and over-the-counter medicines.  Any problems you or family members have had with anesthetic medicines.  Any blood disorders you have.  Any surgeries you have had.  Any medical conditions you have.  Whether you are pregnant or may be pregnant. What are the risks? Generally, this is a safe procedure. However, problems may occur, including:  Pain or soreness at the injection site.  Infection at the injection site.  Damage to nerves or blood vessels.  What happens before the procedure?  Ask your health care provider about: ? Changing or stopping your regular medicines. This is especially important if you are taking diabetes medicines or blood thinners. ? Taking medicines such as aspirin and ibuprofen. These medicines can thin your blood. Do not take these medicines before your procedure if your health care provider instructs you not to.  Follow instructions from your health care provider about eating or drinking restrictions.  Plan to have someone take you home after the procedure.  If you go home   right after the procedure, plan to have someone with you for 24 hours. What happens during the procedure?  You  will be given one or more of the following: ? A medicine to help you relax (sedative). ? A medicine to numb the area (local anesthetic).  You will be awake during the procedure. You will need to be able to talk with the health care provider during the procedure.  With the help of a type of X-ray (fluoroscopy), the health care provider will insert a radiofrequency needle into the area to be treated.  Next, a wire that carries the radio waves (electrode) will be put through the radiofrequency needle. An electrical pulse will be sent through the electrode to verify the correct nerve. You will feel a tingling sensation, and you may have muscle twitching.  Then, the tissue that is around the needle tip will be heated by an electric current that is passed using the radiofrequency machine. This will numb the nerves.  A bandage (dressing) will be put on the insertion area after the procedure is done. The procedure may vary among health care providers and hospitals. What happens after the procedure?  Your blood pressure, heart rate, breathing rate, and blood oxygen level will be monitored often until the medicines you were given have worn off.  Return to your normal activities as directed by your health care provider. This information is not intended to replace advice given to you by your health care provider. Make sure you discuss any questions you have with your health care provider. Document Released: 03/08/2011 Document Revised: 12/16/2015 Document Reviewed: 08/17/2014 Elsevier Interactive Patient Education  2018 [employer] Facet Blocks Patient Information  Description: The facets are joints in the spine between the vertebrae.  Like any joints in the body, facets can become irritated and painful.  Arthritis can also effect the facets.  By injecting steroids and local anesthetic in and around these joints, we can temporarily block the nerve supply to them.  Steroids act directly on irritated  nerves and tissues to reduce selling and inflammation which often leads to decreased pain.  Facet blocks may be done anywhere along the spine from the neck to the low back depending upon the location of your pain.   After numbing the skin with local anesthetic (like Novocaine), a small needle is passed onto the facet joints under x-ray guidance.  You may experience a sensation of pressure while this is being done.  The entire block usually lasts about 15-25 minutes.   Conditions which may be treated by facet blocks:   Low back/buttock pain  Neck/shoulder pain  Certain types of headaches  Preparation for the injection:  1. Do not eat any solid food or dairy products within 8 hours of your appointment. 2. You may drink clear liquid up to 3 hours before appointment.  Clear liquids include water, black coffee, juice or soda.  No milk or cream please. 3. You may take your regular medication, including pain medications, with a sip of water before your appointment.  Diabetics should hold regular insulin (if taken separately) and take 1/2 normal NPH dose the morning of the procedure.  Carry some sugar containing items with you to your appointment. 4. A driver must accompany you and be prepared to drive you home after your procedure. 5. Bring all your current medications with you. 6. An IV may be inserted and sedation may be given at the discretion of the physician. 7. A blood pressure cuff, EKG and  other monitors will often be applied during the procedure.  Some patients may need to have extra oxygen administered for a short period. 8. You will be asked to provide medical information, including your allergies and medications, prior to the procedure.  We must know immediately if you are taking blood thinners (like Coumadin/Warfarin) or if you are allergic to IV iodine contrast (dye).  We must know if you could possible be pregnant.  Possible side-effects:   Bleeding from needle site  Infection  (rare, may require surgery)  Nerve injury (rare)  Numbness & tingling (temporary)  Difficulty urinating (rare, temporary)  Spinal headache (a headache worse with upright posture)  Light-headedness (temporary)  Pain at injection site (serveral days)  Decreased blood pressure (rare, temporary)  Weakness in arm/leg (temporary)  Pressure sensation in back/neck (temporary)   Call if you experience:   Fever/chills associated with headache or increased back/neck pain  Headache worsened by an upright position  New onset, weakness or numbness of an extremity below the injection site  Hives or difficulty breathing (go to the emergency room)  Inflammation or drainage at the injection site(s)  Severe back/neck pain greater than usual  New symptoms which are concerning to you  Please note:  Although the local anesthetic injected can often make your back or neck feel good for several hours after the injection, the pain will likely return. It takes 3-7 days for steroids to work.  You may not notice any pain relief for at least one week.  If effective, we will often do a series of 2-3 injections spaced 3-6 weeks apart to maximally decrease your pain.  After the initial series, you may be a candidate for a more permanent nerve block of the facets.  If you have any questions, please call #[phone]) [place] for your procedure (without sedation) Instructions: . Oral Intake: Do not eat or drink anything for at least 3 hours prior to your procedure. . Transportation: Unless otherwise stated by your physician, you may drive yourself after the procedure. . Blood Pressure Medicine: Take your blood pressure medicine with a sip of water the morning of the procedure. . Insulin: Take only  of your normal insulin dose. . Preventing infections: Shower with an antibacterial soap the morning of your procedure. . Build-up your immune system: Take  1000 mg of Vitamin C with every meal (3 times a day) the day prior to your procedure. . Pregnancy: If you are pregnant, call and cancel the procedure. . Sickness: If you have a cold, fever, or any active infections, call and cancel the procedure. . Arrival: You must be in the facility at least 30 minutes prior to your scheduled procedure. . Children: Do not bring any children with you. . Dress appropriately: Bring dark clothing that you would not mind if they get stained. . Valuables: Do not bring any jewelry or valuables. Procedure appointments are reserved for interventional treatments only. [name] No Prescription Refills. . No medication changes will be discussed during procedure appointments. No disability issues will be discussed.

## 2017-01-23 ENCOUNTER — Other Ambulatory Visit: Payer: Self-pay | Admitting: Internal Medicine

## 2017-01-23 NOTE — Telephone Encounter (Signed)
Pt overdue for appt, last OV 10/2016- was instructed to f/u in 4 weeks. Will send 30 day supply until seen.

## 2017-01-23 NOTE — Telephone Encounter (Signed)
Relation to IP:[id number] Call back number:[phone] Pharmacy:  [facility] [address] [place], [place] [phone] (Phone) [phone] (Fax)     Reason for call:  Patient requesting a 90 day supply sertraline (ZOLOFT) 50 MG tablet

## 2017-01-23 NOTE — Telephone Encounter (Signed)
Patient scheduled for Tue 02/20/2017

## 2017-01-30 NOTE — Telephone Encounter (Signed)
NA

## 2017-02-20 ENCOUNTER — Ambulatory Visit (INDEPENDENT_AMBULATORY_CARE_PROVIDER_SITE_OTHER): Payer: Medicare HMO | Admitting: Internal Medicine

## 2017-02-20 ENCOUNTER — Encounter: Payer: Self-pay | Admitting: Internal Medicine

## 2017-02-20 VITALS — BP 128/80 | HR 78 | Temp 98.0°F | Resp 14 | Ht 65.0 in | Wt 245.0 lb

## 2017-02-20 DIAGNOSIS — I10 Essential (primary) hypertension: Secondary | ICD-10-CM

## 2017-02-20 DIAGNOSIS — F32A Depression, unspecified: Secondary | ICD-10-CM

## 2017-02-20 DIAGNOSIS — F329 Major depressive disorder, single episode, unspecified: Secondary | ICD-10-CM

## 2017-02-20 MED ORDER — SERTRALINE HCL 50 MG PO TABS: 150.0000 mg | ORAL_TABLET | Freq: Every day | ORAL | 2 refills | Status: DC

## 2017-02-20 NOTE — Progress Notes (Signed)
Pre visit review using our clinic review tool, if applicable. No additional management support is needed unless otherwise documented below in the visit note. 

## 2017-02-20 NOTE — Patient Instructions (Signed)
  GO TO THE FRONT DESK Schedule your next appointment for a  Physical by 08-2017

## 2017-02-20 NOTE — Progress Notes (Signed)
Subjective:    Patient ID: Wanda [name], female    DOB: 1946-06-25, 71 y.o.   MRN: [id number]  DOS:  02/20/2017 Type of visit - description : rov Interval history: Anxiety depression: Since the last time she was here, Zoloft increased, things are better. Husband's health is stable. HTN: Needs a refill on lisinopril    Review of Systems Denies nausea, vomiting, diarrhea. No suicidal ideas Sleeping better with Ambien as needed  Past Medical History:  Diagnosis Date  . Allergy    RHINITIS  . Chronic back pain   . Depression   . DJD (degenerative joint disease)    back pain , on disability  . Hyperlipidemia   . Hypertension   . Menopause    onset age 53   . TMJ PAIN 05/17/2010   Qualifier: Diagnosis of  By: Larose Kells MD, [place]     Past Surgical History:  Procedure Laterality Date  . G3 P3       Social History   Social History  . Marital status: Married    Spouse name: N/A  . Number of children: 3  . Years of education: N/A   Occupational History  . ON DISABILITY    Social History Main Topics  . Smoking status: Never Smoker  . Smokeless tobacco: Never Used  . Alcohol use No  . Drug use: No  . Sexual activity: No   Other Topics Concern  . Not on file   Social History Narrative   Married, 3 kids, 6 G-kids    Household-- pt, husband, son      Allergies as of 02/20/2017      Reactions   Simvastatin    REACTION: cramps      Medication List       Accurate as of 02/20/17 11:59 PM. Always use your most recent med list.          aspirin EC 81 MG tablet Take 81 mg by mouth daily.   atorvastatin 10 MG tablet Commonly known as:  LIPITOR Take 1 tablet (10 mg total) by mouth at bedtime.   cyclobenzaprine 10 MG tablet Commonly known as:  FLEXERIL cyclobenzaprine 10 mg tablet   cyclobenzaprine 10 MG tablet Commonly known as:  FLEXERIL Take 1 tablet (10 mg total) by mouth 2 (two) times daily as needed for muscle spasms.   gabapentin 300 MG  capsule Commonly known as:  NEURONTIN Take 1 capsule (300 mg total) by mouth 3 (three) times daily.   hydrochlorothiazide 25 MG tablet Commonly known as:  HYDRODIURIL Take 1 tablet (25 mg total) by mouth daily.   HYDROcodone-acetaminophen 5-325 MG tablet Commonly known as:  NORCO/VICODIN hydrocodone 5 mg-acetaminophen 325 mg tablet   HYDROcodone-acetaminophen 5-325 MG tablet Commonly known as:  NORCO/VICODIN Take 1 tablet by mouth every 6 (six) hours as needed for severe pain.   HYDROcodone-acetaminophen 5-325 MG tablet Commonly known as:  NORCO/VICODIN Take 1 tablet by mouth every 6 (six) hours as needed for severe pain.   HYDROcodone-acetaminophen 5-325 MG tablet Commonly known as:  NORCO/VICODIN Take 1 tablet by mouth every 6 (six) hours as needed for severe pain.   lisinopril 10 MG tablet Commonly known as:  PRINIVIL,ZESTRIL Take 1 tablet (10 mg total) by mouth daily.   magnesium oxide 400 (241.3 Mg) MG tablet Commonly known as:  MAG-OX magnesium oxide 400 mg tablet   magnesium oxide 400 MG tablet Commonly known as:  MAG-OX Take 1 tablet (400 mg total) by mouth daily.  sertraline 50 MG tablet Commonly known as:  ZOLOFT Take 3 tablets (150 mg total) by mouth daily.   Vitamin D 2000 units tablet Take 2,000 Units by mouth daily.   zolpidem 5 MG tablet Commonly known as:  AMBIEN Take 1 tablet (5 mg total) by mouth at bedtime as needed for sleep.          Objective:   Physical Exam BP 128/80 (BP Location: Left Arm, Patient Position: Sitting, Cuff Size: Normal)   Pulse 78   Temp 98 F (36.7 C) (Oral)   Resp 14   Ht 5\' 5"  (1.651 m)   Wt 245 lb (111.1 kg)   SpO2 96%   BMI 40.77 kg/m  General:   Well developed, well nourished . NAD.  HEENT:  Normocephalic . Face symmetric, atraumatic  Neurologic:  alert & oriented X3.  Speech normal, gait appropriate for age and unassisted Psych--  Cognition and judgment appear intact.  Cooperative with normal  attention span and concentration.  Behavior appropriate. No anxious or depressed appearing.      Assessment & Plan:    Assessment HTN Hyperlipidemia Depression Morbid obesity MSK: DJD, chronic back-knee pain, hydrocodone rx by DR Dossie Arbour  PLAN: Depression: Zoloft increase at the last visit, doing better. RF PRN. Encourage healthy lifestyle, he takes care of 2 family members, I reminded her that she needs to take care of herself also.  HTN: Well controlled, last BMP satisfactory, refill medications. RTC [id number]

## 2017-02-21 NOTE — Assessment & Plan Note (Signed)
Depression: Zoloft increase at the last visit, doing better. RF PRN. Encourage healthy lifestyle, he takes care of 2 family members, I reminded her that she needs to take care of herself also.  HTN: Well controlled, last BMP satisfactory, refill medications. RTC [id number]

## 2017-03-13 ENCOUNTER — Encounter: Payer: Medicare HMO | Admitting: Nurse Practitioner

## 2017-03-19 ENCOUNTER — Encounter: Payer: Medicare HMO | Admitting: Nurse Practitioner

## 2017-04-11 ENCOUNTER — Ambulatory Visit: Payer: Medicare HMO | Admitting: Nurse Practitioner

## 2017-04-12 ENCOUNTER — Ambulatory Visit: Payer: Medicare HMO | Admitting: Nurse Practitioner

## 2017-04-18 ENCOUNTER — Encounter: Payer: Self-pay | Admitting: Nurse Practitioner

## 2017-04-18 ENCOUNTER — Ambulatory Visit: Payer: Medicare HMO | Attending: Nurse Practitioner | Admitting: Nurse Practitioner

## 2017-04-18 VITALS — BP 140/78 | HR 65 | Temp 97.7°F | Resp 16 | Ht 64.0 in | Wt 242.0 lb

## 2017-04-18 DIAGNOSIS — F419 Anxiety disorder, unspecified: Secondary | ICD-10-CM | POA: Diagnosis not present

## 2017-04-18 DIAGNOSIS — M792 Neuralgia and neuritis, unspecified: Secondary | ICD-10-CM

## 2017-04-18 DIAGNOSIS — M170 Bilateral primary osteoarthritis of knee: Secondary | ICD-10-CM | POA: Insufficient documentation

## 2017-04-18 DIAGNOSIS — Z7982 Long term (current) use of aspirin: Secondary | ICD-10-CM | POA: Insufficient documentation

## 2017-04-18 DIAGNOSIS — M791 Myalgia: Secondary | ICD-10-CM | POA: Diagnosis not present

## 2017-04-18 DIAGNOSIS — Z6841 Body Mass Index (BMI) 40.0 and over, adult: Secondary | ICD-10-CM | POA: Diagnosis not present

## 2017-04-18 DIAGNOSIS — F329 Major depressive disorder, single episode, unspecified: Secondary | ICD-10-CM | POA: Insufficient documentation

## 2017-04-18 DIAGNOSIS — M25562 Pain in left knee: Secondary | ICD-10-CM | POA: Diagnosis not present

## 2017-04-18 DIAGNOSIS — I10 Essential (primary) hypertension: Secondary | ICD-10-CM | POA: Insufficient documentation

## 2017-04-18 DIAGNOSIS — M48061 Spinal stenosis, lumbar region without neurogenic claudication: Secondary | ICD-10-CM | POA: Insufficient documentation

## 2017-04-18 DIAGNOSIS — Z79891 Long term (current) use of opiate analgesic: Secondary | ICD-10-CM | POA: Insufficient documentation

## 2017-04-18 DIAGNOSIS — M160 Bilateral primary osteoarthritis of hip: Secondary | ICD-10-CM | POA: Insufficient documentation

## 2017-04-18 DIAGNOSIS — M797 Fibromyalgia: Secondary | ICD-10-CM | POA: Diagnosis not present

## 2017-04-18 DIAGNOSIS — E559 Vitamin D deficiency, unspecified: Secondary | ICD-10-CM | POA: Insufficient documentation

## 2017-04-18 DIAGNOSIS — G894 Chronic pain syndrome: Secondary | ICD-10-CM | POA: Diagnosis not present

## 2017-04-18 DIAGNOSIS — M545 Low back pain: Secondary | ICD-10-CM | POA: Insufficient documentation

## 2017-04-18 DIAGNOSIS — G8929 Other chronic pain: Secondary | ICD-10-CM | POA: Diagnosis not present

## 2017-04-18 DIAGNOSIS — M7918 Myalgia, other site: Secondary | ICD-10-CM

## 2017-04-18 DIAGNOSIS — M5416 Radiculopathy, lumbar region: Secondary | ICD-10-CM

## 2017-04-18 DIAGNOSIS — Z79899 Other long term (current) drug therapy: Secondary | ICD-10-CM | POA: Diagnosis not present

## 2017-04-18 DIAGNOSIS — M5442 Lumbago with sciatica, left side: Secondary | ICD-10-CM | POA: Diagnosis not present

## 2017-04-18 DIAGNOSIS — E785 Hyperlipidemia, unspecified: Secondary | ICD-10-CM | POA: Diagnosis not present

## 2017-04-18 DIAGNOSIS — Z5181 Encounter for therapeutic drug level monitoring: Secondary | ICD-10-CM | POA: Insufficient documentation

## 2017-04-18 MED ORDER — HYDROCODONE-ACETAMINOPHEN 5-325 MG PO TABS: 1.0000 | ORAL_TABLET | Freq: Three times a day (TID) | ORAL | 0 refills | Status: DC

## 2017-04-18 MED ORDER — CYCLOBENZAPRINE HCL 10 MG PO TABS: 10.0000 mg | ORAL_TABLET | Freq: Two times a day (BID) | ORAL | 2 refills | Status: DC | PRN

## 2017-04-18 MED ORDER — HYDROCODONE-ACETAMINOPHEN 5-325 MG PO TABS: 1.0000 | ORAL_TABLET | Freq: Four times a day (QID) | ORAL | 0 refills | Status: DC | PRN

## 2017-04-18 MED ORDER — GABAPENTIN 300 MG PO CAPS: 300.0000 mg | ORAL_CAPSULE | Freq: Three times a day (TID) | ORAL | 2 refills | Status: DC

## 2017-04-18 NOTE — Patient Instructions (Addendum)
____________________________________________________________________________________________  Medication Rules  Applies to: All patients receiving prescriptions (written or electronic).  Pharmacy of record: Pharmacy where electronic prescriptions will be sent. If written prescriptions are taken to a different pharmacy, please inform the nursing staff. The pharmacy listed in the electronic medical record should be the one where you would like electronic prescriptions to be sent.  Prescription refills: Only during scheduled appointments. Applies to both, written and electronic prescriptions.  NOTE: The following applies primarily to controlled substances (Opioid* Pain Medications).   Patient's responsibilities: 1. Pain Pills: Bring all pain pills to every appointment (except for procedure appointments). 2. Pill Bottles: Bring pills in original pharmacy bottle. Always bring newest bottle. Bring bottle, even if empty. 3. Medication refills: You are responsible for knowing and keeping track of what medications you need refilled. The day before your appointment, write a list of all prescriptions that need to be refilled. Bring that list to your appointment and give it to the admitting nurse. Prescriptions will be written only during appointments. If you forget a medication, it will not be "Called in", "Faxed", or "electronically sent". You will need to get another appointment to get these prescribed. 4. Prescription Accuracy: You are responsible for carefully inspecting your prescriptions before leaving our office. Have the discharge nurse carefully go over each prescription with you, before taking them home. Make sure that your name is accurately spelled, that your address is correct. Check the name and dose of your medication to make sure it is accurate. Check the number of pills, and the written instructions to make sure they are clear and accurate. Make sure that you are given enough medication to  last until your next medication refill appointment. 5. Taking Medication: Take medication as prescribed. Never take more pills than instructed. Never take medication more frequently than prescribed. Taking less pills or less frequently is permitted and encouraged, when it comes to controlled substances (written prescriptions).  6. Inform other Doctors: Always inform, all of your healthcare providers, of all the medications you take. 7. Pain Medication from other Providers: You are not allowed to accept any additional pain medication from any other Doctor or Healthcare provider. There are two exceptions to this rule. (see below) In the event that you require additional pain medication, you are responsible for notifying us, as stated below. 8. Medication Agreement: You are responsible for carefully reading and following our Medication Agreement. This must be signed before receiving any prescriptions from our practice. Safely store a copy of your signed Agreement. Violations to the Agreement will result in no further prescriptions. (Additional copies of our Medication Agreement are available upon request.) 9. Laws, Rules, & Regulations: All patients are expected to follow all Federal and State Laws, Statutes, Rules, & Regulations. Ignorance of the Laws does not constitute a valid excuse. The use of any illegal substances is prohibited. 10. Adopted CDC guidelines & recommendations: Target dosing levels will be at or below 60 MME/day. Use of benzodiazepines** is not recommended.  Exceptions: There are only two exceptions to the rule of not receiving pain medications from other Healthcare Providers. 1. Exception #1 (Emergencies): In the event of an emergency (i.e.: accident requiring emergency care), you are allowed to receive additional pain medication. However, you are responsible for: As soon as you are able, call our office [phone], at any time of the day or night, and leave a message stating your  name, the date and nature of the emergency, and the name and dose of the medication   prescribed. In the event that your call is answered by a member of our staff, make sure to document and save the date, time, and the name of the person that took your information.  2. Exception #2 (Planned Surgery): In the event that you are scheduled by another doctor or dentist to have any type of surgery or procedure, you are allowed (for a period no longer than 30 days), to receive additional pain medication, for the acute post-op pain. However, in this case, you are responsible for picking up a copy of our "Post-op Pain Management for Surgeons" handout, and giving it to your surgeon or dentist. This document is available at our office, and does not require an appointment to obtain it. Simply go to our office during business hours (Monday-Thursday from 8:00 AM to 4:00 PM) (Friday 8:00 AM to 12:00 Noon) or if you have a scheduled appointment with [place], prior to your surgery, and ask for it by name. In addition, you will need to provide [place] with your name, name of your surgeon, type of surgery, and date of procedure or surgery.  *Opioid medications include: morphine, codeine, oxycodone, oxymorphone, hydrocodone, hydromorphone, meperidine, tramadol, tapentadol, buprenorphine, fentanyl, methadone. **Benzodiazepine medications include: diazepam (Valium), alprazolam (Xanax), clonazepam (Klonopine), lorazepam (Ativan), clorazepate (Tranxene), chlordiazepoxide (Librium), estazolam (Prosom), oxazepam (Serax), temazepam (Restoril), triazolam (Halcion)  ____________________________________________________________________________________________  BMI Assessment: Estimated body mass index is 41.54 kg/m as calculated from the following:   Height as of this encounter: 5\' 4"  (1.626 m).   Weight as of this encounter: 242 lb (109.8 kg).  BMI interpretation table: BMI level Category Range association with higher incidence of chronic pain   <18 kg/m2 Underweight   18.5-24.9 kg/m2 Ideal body weight   25-29.9 kg/m2 Overweight Increased incidence by 20%  30-34.9 kg/m2 Obese (Class I) Increased incidence by 68%  35-39.9 kg/m2 Severe obesity (Class II) Increased incidence by 136%  >40 kg/m2 Extreme obesity (Class III) Increased incidence by 254%   BMI Readings from Last 4 Encounters:  04/18/17 41.54 kg/m  02/20/17 40.77 kg/m  01/18/17 39.94 kg/m  01/08/17 41.20 kg/m   Wt Readings from Last 4 Encounters:  04/18/17 242 lb (109.8 kg)  02/20/17 245 lb (111.1 kg)  01/18/17 240 lb (108.9 kg)  01/08/17 240 lb (108.9 kg)   Knee Injection A knee injection is a procedure to get medicine into your knee joint. Your health care provider puts a needle into the joint and injects medicine with an attached syringe. The injected medicine may relieve the pain, swelling, and stiffness of arthritis. The injected medicine may also help to lubricate and cushion your knee joint. You may need more than one injection. Tell a health care provider about:  Any allergies you have.  All medicines you are taking, including vitamins, herbs, eye drops, creams, and over-the-counter medicines.  Any problems you or family members have had with anesthetic medicines.  Any blood disorders you have.  Any surgeries you have had.  Any medical conditions you have. What are the risks? Generally, this is a safe procedure. However, problems may occur, including:  Infection.  Bleeding.  Worsening symptoms.  Damage to the area around your knee.  Allergic reaction to any of the medicines.  Skin reactions from repeated injections.  What happens before the procedure?  Ask your health care provider about changing or stopping your regular medicines. This is especially important if you are taking diabetes medicines or blood thinners.  Plan to have someone take you home after the procedure.  What happens during the procedure?  You will sit or lie down  in a position for your knee to be treated.  The skin over your kneecap will be cleaned with a germ-killing solution (antiseptic).  You will be given a medicine that numbs the area (local anesthetic). You may feel some stinging.  After your knee becomes numb, you will have a second injection. This is the medicine. This needle is carefully placed between your kneecap and your knee. The medicine is injected into the joint space.  At the end of the procedure, the needle will be removed.  A bandage (dressing) may be placed over the injection site. The procedure may vary among health care providers and hospitals. What happens after the procedure?  You may have to move your knee through its full range of motion. This helps to get all of the medicine into your joint space.  Your blood pressure, heart rate, breathing rate, and blood oxygen level will be monitored often until the medicines you were given have worn off.  You will be watched to make sure that you do not have a reaction to the injected medicine. This information is not intended to replace advice given to you by your health care provider. Make sure you discuss any questions you have with your health care provider. Document Released: 10/01/2006 Document Revised: 12/10/2015 Document Reviewed: 05/20/2014 Elsevier Interactive Patient Education  2018 [place]  What are the risk, side effects and possible complications? Generally speaking, most procedures are safe.  However, with any procedure there are risks, side effects, and the possibility of complications.  The risks and complications are dependent upon the sites that are lesioned, or the type of nerve block to be performed.  The closer the procedure is to the spine, the more serious the risks are.  Great care is taken when placing the radio frequency needles, block needles or lesioning probes, but sometimes complications can occur. 1. Infection: Any  time there is an injection through the skin, there is a risk of infection.  This is why sterile conditions are used for these blocks.  There are four possible types of infection. 1. Localized skin infection. 2. Central Nervous System Infection-This can be in the form of Meningitis, which can be deadly. 3. Epidural Infections-This can be in the form of an epidural abscess, which can cause pressure inside of the spine, causing compression of the spinal cord with subsequent paralysis. This would require an emergency surgery to decompress, and there are no guarantees that the patient would recover from the paralysis. 4. Discitis-This is an infection of the intervertebral discs.  It occurs in about 1% of discography procedures.  It is difficult to treat and it may lead to surgery.        2. Pain: the needles have to go through skin and soft tissues, will cause soreness.       3. Damage to internal structures:  The nerves to be lesioned may be near blood vessels or    other nerves which can be potentially damaged.       4. Bleeding: Bleeding is more common if the patient is taking blood thinners such as  aspirin, Coumadin, Ticiid, Plavix, etc., or if he/she have some genetic predisposition  such as hemophilia. Bleeding into the spinal canal can cause compression of the spinal  cord with subsequent paralysis.  This would require an emergency surgery to  decompress and there are no guarantees that the patient  would recover from the  paralysis.       5. Pneumothorax:  Puncturing of a lung is a possibility, every time a needle is introduced in  the area of the chest or upper back.  Pneumothorax refers to free air around the  collapsed lung(s), inside of the thoracic cavity (chest cavity).  Another two possible  complications related to a similar event would include: Hemothorax and Chylothorax.   These are variations of the Pneumothorax, where instead of air around the collapsed  lung(s), you may have blood or chyle,  respectively.       6. Spinal headaches: They may occur with any procedures in the area of the spine.       7. Persistent CSF (Cerebro-Spinal Fluid) leakage: This is a rare problem, but may occur  with prolonged intrathecal or epidural catheters either due to the formation of a fistulous  track or a dural tear.       8. Nerve damage: By working so close to the spinal cord, there is always a possibility of  nerve damage, which could be as serious as a permanent spinal cord injury with  paralysis.       9. Death:  Although rare, severe deadly allergic reactions known as "Anaphylactic  reaction" can occur to any of the medications used.      10. Worsening of the symptoms:  We can always make thing worse.  What are the chances of something like this happening? Chances of any of this occuring are extremely low.  By statistics, you have more of a chance of getting killed in a motor vehicle accident: while driving to the hospital than any of the above occurring .  Nevertheless, you should be aware that they are possibilities.  In general, it is similar to taking a shower.  Everybody knows that you can slip, hit your head and get killed.  Does that mean that you should not shower again?  Nevertheless always keep in mind that statistics do not mean anything if you happen to be on the wrong side of them.  Even if a procedure has a 1 (one) in a 1,000,000 (million) chance of going wrong, it you happen to be that one..Also, keep in mind that by statistics, you have more of a chance of having something go wrong when taking medications.  Who should not have this procedure? If you are on a blood thinning medication (e.g. Coumadin, Plavix, see list of "Blood Thinners"), or if you have an active infection going on, you should not have the procedure.  If you are taking any blood thinners, please inform your physician.  How should I prepare for this procedure?  Do not eat or drink anything at least six hours prior to the  procedure.  Bring a driver with you .  It cannot be a taxi.  Come accompanied by an adult that can drive you back, and that is strong enough to help you if your legs get weak or numb from the local anesthetic.  Take all of your medicines the morning of the procedure with just enough water to swallow them.  If you have diabetes, make sure that you are scheduled to have your procedure done first thing in the morning, whenever possible.  If you have diabetes, take only half of your insulin dose and notify our nurse that you have done so as soon as you arrive at the clinic.  If you are diabetic, but only take blood sugar pills (  oral hypoglycemic), then do not take them on the morning of your procedure.  You may take them after you have had the procedure.  Do not take aspirin or any aspirin-containing medications, at least eleven (11) days prior to the procedure.  They may prolong bleeding.  Wear loose fitting clothing that may be easy to take off and that you would not mind if it got stained with Betadine or blood.  Do not wear any jewelry or perfume  Remove any nail coloring.  It will interfere with some of our monitoring equipment.  NOTE: Remember that this is not meant to be interpreted as a complete list of all possible complications.  Unforeseen problems may occur.  BLOOD THINNERS The following drugs contain aspirin or other products, which can cause increased bleeding during surgery and should not be taken for 2 weeks prior to and 1 week after surgery.  If you should need take something for relief of minor pain, you may take acetaminophen which is found in Tylenol,m Datril, Anacin-3 and Panadol. It is not blood thinner. The products listed below are.  Do not take any of the products listed below in addition to any listed on your instruction sheet.  A.P.C or A.P.C with Codeine Codeine Phosphate Capsules #3 Ibuprofen Ridaura  ABC compound Congesprin Imuran rimadil  Advil Cope Indocin  Robaxisal  Alka-Seltzer Effervescent Pain Reliever and Antacid Coricidin or Coricidin-D  Indomethacin Rufen  Alka-Seltzer plus Cold Medicine Cosprin Ketoprofen S-A-C Tablets  Anacin Analgesic Tablets or Capsules Coumadin Korlgesic Salflex  Anacin Extra Strength Analgesic tablets or capsules CP-2 Tablets Lanoril Salicylate  Anaprox Cuprimine Capsules Levenox Salocol  Anexsia-D Dalteparin Magan Salsalate  Anodynos Darvon compound Magnesium Salicylate Sine-off  Ansaid Dasin Capsules Magsal Sodium Salicylate  Anturane Depen Capsules Marnal Soma  APF Arthritis pain formula Dewitt's Pills Measurin Stanback  Argesic Dia-Gesic Meclofenamic Sulfinpyrazone  Arthritis Bayer Timed Release Aspirin Diclofenac Meclomen Sulindac  Arthritis pain formula Anacin Dicumarol Medipren Supac  Analgesic (Safety coated) Arthralgen Diffunasal Mefanamic Suprofen  Arthritis Strength Bufferin Dihydrocodeine Mepro Compound Suprol  Arthropan liquid Dopirydamole Methcarbomol with Aspirin Synalgos  ASA tablets/Enseals Disalcid Micrainin Tagament  Ascriptin Doan's Midol Talwin  Ascriptin A/D Dolene Mobidin Tanderil  Ascriptin Extra Strength Dolobid Moblgesic Ticlid  Ascriptin with Codeine Doloprin or Doloprin with Codeine Momentum Tolectin  Asperbuf Duoprin Mono-gesic Trendar  Aspergum Duradyne Motrin or Motrin IB Triminicin  Aspirin plain, buffered or enteric coated Durasal Myochrisine Trigesic  Aspirin Suppositories Easprin Nalfon Trillsate  Aspirin with Codeine Ecotrin Regular or Extra Strength Naprosyn Uracel  Atromid-S Efficin Naproxen Ursinus  Auranofin Capsules Elmiron Neocylate Vanquish  Axotal Emagrin Norgesic Verin  Azathioprine Empirin or Empirin with Codeine Normiflo Vitamin E  Azolid Emprazil Nuprin Voltaren  Bayer Aspirin plain, buffered or children's or timed BC Tablets or powders Encaprin Orgaran Warfarin Sodium  Buff-a-Comp Enoxaparin Orudis Zorpin  Buff-a-Comp with Codeine Equegesic Os-Cal-Gesic    Buffaprin Excedrin plain, buffered or Extra Strength Oxalid   Bufferin Arthritis Strength Feldene Oxphenbutazone   Bufferin plain or Extra Strength Feldene Capsules Oxycodone with Aspirin   Bufferin with Codeine Fenoprofen Fenoprofen Pabalate or Pabalate-SF   Buffets II Flogesic Panagesic   Buffinol plain or Extra Strength Florinal or Florinal with Codeine Panwarfarin   Buf-Tabs Flurbiprofen Penicillamine   Butalbital Compound Four-way cold tablets Penicillin   Butazolidin Fragmin Pepto-Bismol   Carbenicillin Geminisyn Percodan   Carna Arthritis Reliever Geopen Persantine   Carprofen Gold's salt Persistin   Chloramphenicol Goody's Phenylbutazone   Chloromycetin Haltrain Piroxlcam  Clmetidine heparin Plaquenil   Cllnoril Hyco-pap Ponstel   Clofibrate Hydroxy chloroquine Propoxyphen         Before stopping any of these medications, be sure to consult the physician who ordered them.  Some, such as Coumadin (Warfarin) are ordered to prevent or treat serious conditions such as "deep thrombosis", "pumonary embolisms", and other heart problems.  The amount of time that you may need off of the medication may also vary with the medication and the reason for which you were taking it.  If you are taking any of these medications, please make sure you notify your pain physician before you undergo any procedures.

## 2017-04-18 NOTE — Progress Notes (Signed)
Nursing Pain Medication Assessment:  Safety precautions to be maintained throughout the outpatient stay will include: orient to surroundings, keep bed in low position, maintain call bell within reach at all times, provide assistance with transfer out of bed and ambulation.  Medication Inspection Compliance: Pill count conducted under aseptic conditions, in front of the patient. Neither the pills nor the bottle was removed from the patient's sight at any time. Once count was completed pills were immediately returned to the patient in their original bottle.  Medication: Hydrocodone/APAP Pill/Patch Count: 54 of 120 pills remain Pill/Patch Appearance: Markings consistent with prescribed medication Bottle Appearance: Standard pharmacy container. Clearly labeled. Filled Date: 08 / 25 / 2018 Last Medication intake:  Today   Called to [facility] to confirm fill dates.  Rx filled on date listed above and picked up by patient on 03/19/17

## 2017-04-18 NOTE — Progress Notes (Signed)
Patient's Name: Wanda [name]  MRN: [id number]  Referring Provider: Colon Branch, MD  DOB: June 14, 1946  PCP: Colon Branch, MD  DOS: 04/18/2017  Note by: Vevelyn Francois NP  Service setting: Ambulatory outpatient  Specialty: Interventional Pain Management  Location: [facility] (AMB) Pain Management Facility    Patient type: Established    Primary Reason(s) for Visit: Encounter for prescription drug management. (Level of risk: moderate)  CC: Back Pain (lower bilateral) and Knee Pain (left)  HPI  Wanda [name] is a 71 y.o. year old, female patient, who comes today for a medication management evaluation. 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, L3-4, L4-5, and L6-S1); 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 Her primarily concern today is the Back Pain (lower bilateral) and Knee Pain (left)  Pain Assessment: Location: Lower, Left, Right, Medial (left) Back  (knee) Radiating: NA Onset: More than a month ago Duration: Chronic pain Quality: Constant, Discomfort, Nagging (pulsating) Severity: 3 /10 (self-reported pain score)  Note: Reported level is compatible with observation.                    Effect on ADL: taking care of husband, he has prostate cancer, doesn't feel that she can take gabapentin as needed.   Timing: Constant Modifying factors: sitting, medications  Wanda [name] was last scheduled for an appointment on 12/11/2016 for medication management. During today's appointment we reviewed Wanda [name]'s chronic pain status, as well as her outpatient medication regimen. She uses the Gabapentin prn mainly. She is caring for her husband and admits that sometime she just forgets. She states that she dos her Norco this way also. She does use it am and pm but during the day is optional.   The patient  reports that she does not use drugs. Her body mass index is 41.54 kg/m.  Further details on both, my assessment(s), as well as the proposed treatment plan, please see below.  Controlled Substance Pharmacotherapy Assessment REMS (Risk Evaluation and Mitigation Strategy)  Analgesic:Hydrocodone/APAP 5/325 one every 6 hours (20 mg/day of hydrocodone) MME/day:20 mg/day PJanett Billow RN  04/18/2017  9:44 AM  Sign at close encounter Nursing Pain Medication Assessment:  Safety precautions to be maintained throughout the outpatient stay will include: orient to surroundings, keep bed in low position, maintain call bell within reach at all times, provide assistance with transfer out of bed and ambulation.  Medication Inspection Compliance: Pill count conducted under aseptic conditions, in front of the patient. Neither the pills nor the bottle was removed from the patient's sight at any time. Once count was completed pills were immediately returned to the patient in their original bottle.  Medication: Hydrocodone/APAP Pill/Patch Count: 54 of 120 pills  remain Pill/Patch Appearance: Markings consistent with prescribed medication Bottle Appearance: Standard pharmacy container. Clearly labeled. Filled Date: 08 / 25 / 2018 Last Medication intake:  Today   Called to [facility] to confirm fill dates.  Rx filled on date listed above and picked up by patient on 03/19/17   Pharmacokinetics: Liberation and absorption (onset of action): WNL Distribution (time to peak effect): WNL Metabolism and excretion (duration of action): WNL         Pharmacodynamics: Desired effects: Analgesia: Wanda [name] reports >50% benefit. Functional ability: Patient reports that medication allows her to accomplish basic ADLs Clinically meaningful improvement in function (CMIF): Sustained CMIF goals met Perceived effectiveness: Described as relatively effective, allowing for increase in activities of daily living (ADL) Undesirable effects: Side-effects or Adverse reactions: None reported Monitoring: [place] PMP: Online review of the past 73-monthperiod conducted. Compliant with practice rules and regulations Last UDS on record: No results found for: SUMMARY UDS interpretation: Compliant          Medication Assessment Form: Reviewed. Patient indicates being compliant with therapy Treatment compliance: Compliant Risk Assessment Profile: Aberrant behavior: See prior evaluations. None observed or detected today Comorbid factors increasing risk of overdose: See prior notes. No additional risks detected today Risk of substance use disorder (SUD): Low     Opioid Risk Tool - 04/18/17 0928      Family History of Substance Abuse   Alcohol Negative   Illegal Drugs Negative   Rx Drugs Negative     Personal History of Substance Abuse   Alcohol Negative   Illegal Drugs Negative   Rx Drugs Negative     Psychological Disease   Psychological Disease Positive   ADD Negative   OCD Negative   Bipolar Negative   Schizophrenia Negative   Depression Positive  patient  taking zoloft and states this is managing.      Total Score   Opioid Risk Tool Scoring 3   Opioid Risk Interpretation Low Risk     ORT Scoring interpretation table:  Score <3 = Low Risk for SUD  Score between 4-7 = Moderate Risk for SUD  Score >8 = High Risk for Opioid Abuse   Risk Mitigation Strategies:  Patient Counseling: Covered Patient-Prescriber Agreement (PPA): Present and active  Notification to other healthcare providers: Done  Pharmacologic Plan: No change in therapy, at this time  Laboratory Chemistry  Inflammation Markers (CRP: Acute Phase) (ESR: Chronic Phase) Lab Results  Component Value Date   CRP <0.5 08/18/2015   ESRSEDRATE 28 08/18/2015                 Renal Function Markers Lab Results  Component Value Date   BUN 10 08/30/2016   CREATININE 0.65 08/30/2016   GFRAA >60 08/18/2015   GFRNONAA >60 08/18/2015                 Hepatic Function Markers Lab Results  Component Value Date   AST 19 08/30/2016   ALT 19 08/30/2016   ALBUMIN 4.2 08/30/2016   ALKPHOS 74 08/30/2016   HCVAB NEGATIVE 08/30/2016                 Electrolytes Lab Results  Component Value Date   NA 136 08/30/2016   K 4.2 08/30/2016   CL 102 08/30/2016   CALCIUM 9.8 08/30/2016   MG 2.0 08/18/2015  Neuropathy Markers No results found for: [id number]               Bone Pathology Markers Lab Results  Component Value Date   ALKPHOS 74 08/30/2016   VD25OH 37 05/01/2011   VD125OH2TOT 58 02/01/2015   [id number] 58 02/01/2015   [id number] <8 02/01/2015   CALCIUM 9.8 08/30/2016                 Coagulation Parameters Lab Results  Component Value Date   PLT 176.0 08/30/2016                 Cardiovascular Markers Lab Results  Component Value Date   HGB 13.8 08/30/2016   HCT 40.9 08/30/2016                 Note: Lab results reviewed.  Recent Diagnostic Imaging Results  DG C-Arm 1-60 Min-No Report Fluoroscopy was utilized by the requesting physician.   No radiographic  interpretation.   Note: Imaging results reviewed.        Meds   Current Outpatient Prescriptions:  .  aspirin EC 81 MG tablet, Take 81 mg by mouth daily., Disp: , Rfl:  .  atorvastatin (LIPITOR) 10 MG tablet, Take 1 tablet (10 mg total) by mouth at bedtime., Disp: 90 tablet, Rfl: 3 .  Cholecalciferol (VITAMIN D) 2000 UNITS tablet, Take 2,000 Units by mouth daily., Disp: , Rfl:  .  cyclobenzaprine (FLEXERIL) 10 MG tablet, cyclobenzaprine 10 mg tablet, Disp: , Rfl:  .  hydrochlorothiazide (HYDRODIURIL) 25 MG tablet, Take 1 tablet (25 mg total) by mouth daily., Disp: 90 tablet, Rfl: 3 .  lisinopril (PRINIVIL,ZESTRIL) 10 MG tablet, Take 1 tablet (10 mg total) by mouth daily., Disp: 90 tablet, Rfl: 3 .  sertraline (ZOLOFT) 50 MG tablet, Take 3 tablets (150 mg total) by mouth daily., Disp: 90 tablet, Rfl: 2 .  zolpidem (AMBIEN) 5 MG tablet, Take 1 tablet (5 mg total) by mouth at bedtime as needed for sleep., Disp: 15 tablet, Rfl: 0 .  cyclobenzaprine (FLEXERIL) 10 MG tablet, Take 1 tablet (10 mg total) by mouth 2 (two) times daily as needed for muscle spasms., Disp: 60 tablet, Rfl: 2 .  gabapentin (NEURONTIN) 300 MG capsule, Take 1 capsule (300 mg total) by mouth 3 (three) times daily., Disp: 90 capsule, Rfl: 2 .  HYDROcodone-acetaminophen (NORCO/VICODIN) 5-325 MG tablet, Take 1 tablet by mouth 3 (three) times daily., Disp: 90 tablet, Rfl: 0 .  [START ON 06/03/2017] HYDROcodone-acetaminophen (NORCO/VICODIN) 5-325 MG tablet, Take 1 tablet by mouth every 6 (six) hours as needed for severe pain., Disp: 120 tablet, Rfl: 0 .  [START ON 07/03/2017] HYDROcodone-acetaminophen (NORCO/VICODIN) 5-325 MG tablet, Take 1 tablet by mouth every 6 (six) hours as needed for severe pain., Disp: 120 tablet, Rfl: 0 .  magnesium oxide (MAG-OX) 400 (241.3 Mg) MG tablet, magnesium oxide 400 mg tablet, Disp: , Rfl:  .  magnesium oxide (MAG-OX) 400 MG tablet, Take 1 tablet (400 mg total) by mouth daily.,  Disp: 30 tablet, Rfl: 2 No current facility-administered medications for this visit.   Facility-Administered Medications Ordered in Other Visits:  .  orphenadrine (NORFLEX) injection 60 mg, 60 mg, Intramuscular, Once, Tjuana Vickrey, [employer], NP  ROS  Constitutional: Denies any fever or chills Gastrointestinal: No reported hemesis, hematochezia, vomiting, or acute GI distress Musculoskeletal: Denies any acute onset joint swelling, redness, loss of ROM, or weakness Neurological: No reported episodes of acute onset apraxia, aphasia, dysarthria, agnosia, amnesia, paralysis, loss of  coordination, or loss of consciousness  Allergies  Wanda [name] is allergic to simvastatin.  [place]  Drug: Wanda [name]  reports that she does not use drugs. Alcohol:  reports that she does not drink alcohol. Tobacco:  reports that she has never smoked. She has never used smokeless tobacco. Medical:  has a past medical history of Allergy; Chronic back pain; Depression; DJD (degenerative joint disease); Hyperlipidemia; Hypertension; Menopause; and TMJ PAIN (05/17/2010). Surgical: Wanda [name]  has a past surgical history that includes G3 P3 . Family: family history includes Breast cancer in her sister; Cancer in her brother and sister; Colon cancer in her brother; Diabetes in her unknown relative.  Constitutional Exam  General appearance: Well nourished, well developed, and well hydrated. In no apparent acute distress Vitals:   04/18/17 0920  BP: 140/78  Pulse: 65  Resp: 16  Temp: 97.7 F (36.5 C)  TempSrc: Oral  SpO2: 100%  Weight: 242 lb (109.8 kg)  Height: '5\' 4"'$  (1.626 m)   BMI Assessment: Estimated body mass index is 41.54 kg/m as calculated from the following:   Height as of this encounter: '5\' 4"'$  (1.626 m).   Weight as of this encounter: 242 lb (109.8 kg).  Psych/Mental status: Alert, oriented x 3 (person, place, & time)       Eyes: PERLA Respiratory: No evidence of acute respiratory distress  Cervical  Spine Area Exam  Skin & Axial Inspection: No masses, redness, edema, swelling, or associated skin lesions Alignment: Symmetrical Functional ROM: Unrestricted ROM      Stability: No instability detected Muscle Tone/Strength: Functionally intact. No obvious neuro-muscular anomalies detected. Sensory (Neurological): Unimpaired Palpation: No palpable anomalies              Upper Extremity (UE) Exam    Side: Right upper extremity  Side: Left upper extremity  Skin & Extremity Inspection: Skin color, temperature, and hair growth are WNL. No peripheral edema or cyanosis. No masses, redness, swelling, asymmetry, or associated skin lesions. No contractures.  Skin & Extremity Inspection: Skin color, temperature, and hair growth are WNL. No peripheral edema or cyanosis. No masses, redness, swelling, asymmetry, or associated skin lesions. No contractures.  Functional ROM: Unrestricted ROM          Functional ROM: Unrestricted ROM          Muscle Tone/Strength: Functionally intact. No obvious neuro-muscular anomalies detected.  Muscle Tone/Strength: Functionally intact. No obvious neuro-muscular anomalies detected.  Sensory (Neurological): Unimpaired          Sensory (Neurological): Unimpaired          Palpation: No palpable anomalies              Palpation: No palpable anomalies              Specialized Test(s): Deferred         Specialized Test(s): Deferred          Thoracic Spine Area Exam  Skin & Axial Inspection: No masses, redness, or swelling Alignment: Symmetrical Functional ROM: Unrestricted ROM Stability: No instability detected Muscle Tone/Strength: Functionally intact. No obvious neuro-muscular anomalies detected. Sensory (Neurological): Unimpaired Muscle strength & Tone: No palpable anomalies  Lumbar Spine Area Exam  Skin & Axial Inspection: No masses, redness, or swelling Alignment: Symmetrical Functional ROM: Unrestricted ROM      Stability: No instability detected Muscle  Tone/Strength: Functionally intact. No obvious neuro-muscular anomalies detected. Sensory (Neurological): Unimpaired Palpation: Complains of area being tender to palpation  Provocative Tests: Lumbar Hyperextension and rotation test: evaluation deferred today       Lumbar Lateral bending test: evaluation deferred today       Patrick's Maneuver: evaluation deferred today                    Gait & Posture Assessment  Ambulation: Patient ambulates using a cane Gait: Relatively normal for age and body habitus Posture: WNL   Lower Extremity Exam    Side: Right lower extremity  Side: Left lower extremity  Skin & Extremity Inspection: Skin color, temperature, and hair growth are WNL. No peripheral edema or cyanosis. No masses, redness, swelling, asymmetry, or associated skin lesions. No contractures.  Skin & Extremity Inspection: Skin color, temperature, and hair growth are WNL. No peripheral edema or cyanosis. No masses, redness, swelling, asymmetry, or associated skin lesions. No contractures.  Functional ROM: Unrestricted ROM          Functional ROM: Pain restricted ROM          Muscle Tone/Strength: Functionally intact. No obvious neuro-muscular anomalies detected.  Muscle Tone/Strength: Functionally intact. No obvious neuro-muscular anomalies detected.  Sensory (Neurological): Unimpaired  Sensory (Neurological): Unimpaired  Palpation: No palpable anomalies  Palpation: No palpable anomalies   Assessment  Primary Diagnosis & Pertinent Problem List: The primary encounter diagnosis was Chronic pain of left knee. Diagnoses of Chronic low back pain (Bilateral) (L>R), Chronic lumbar radicular pain (Left) (S1 dermatome), Neurogenic pain, Chronic pain syndrome, Fibromyalgia, and Musculoskeletal pain were also pertinent to this visit.  Status Diagnosis  Controlled Controlled Controlled 1. Chronic pain of left knee   2. Chronic low back pain (Bilateral) (L>R)   3. Chronic lumbar radicular pain  (Left) (S1 dermatome)   4. Neurogenic pain   5. Chronic pain syndrome   6. Fibromyalgia   7. Musculoskeletal pain     Problems updated and reviewed during this visit: No problems updated. Plan of Care  Pharmacotherapy (Medications Ordered): Meds ordered this encounter  Medications  . HYDROcodone-acetaminophen (NORCO/VICODIN) 5-325 MG tablet    Sig: Take 1 tablet by mouth 3 (three) times daily.    Dispense:  90 tablet    Refill:  0    Do not place this medication, or any other prescription from our practice, on "Automatic Refill". Patient may have prescription filled one day early if pharmacy is closed on scheduled refill date. Do not fill until:05/04/2017 To last until:06/03/2017    Order Specific Question:   Supervising Provider    Answer:   Milinda Pointer [phone]  . HYDROcodone-acetaminophen (NORCO/VICODIN) 5-325 MG tablet    Sig: Take 1 tablet by mouth every 6 (six) hours as needed for severe pain.    Dispense:  120 tablet    Refill:  0    Do not place this medication, or any other prescription from our practice, on "Automatic Refill". Patient may have prescription filled one day early if pharmacy is closed on scheduled refill date. Do not fill until: 06/03/2017 To last until:07/03/2017    Order Specific Question:   Supervising Provider    Answer:   Milinda Pointer [phone]  . HYDROcodone-acetaminophen (NORCO/VICODIN) 5-325 MG tablet    Sig: Take 1 tablet by mouth every 6 (six) hours as needed for severe pain.    Dispense:  120 tablet    Refill:  0    Do not place this medication, or any other prescription from our practice, on "Automatic Refill". Patient may have prescription filled one day early  if pharmacy is closed on scheduled refill date. Do not fill until: 07/03/2017 To last until: 08/02/2017    Order Specific Question:   Supervising Provider    Answer:   Milinda Pointer [phone]  . gabapentin (NEURONTIN) 300 MG capsule    Sig: Take 1 capsule (300 mg total)  by mouth 3 (three) times daily.    Dispense:  90 capsule    Refill:  2    Do not place this medication, or any other prescription from our practice, on "Automatic Refill". Patient may have prescription filled one day early if pharmacy is closed on scheduled refill date.    Order Specific Question:   Supervising Provider    Answer:   Milinda Pointer [phone]  . cyclobenzaprine (FLEXERIL) 10 MG tablet    Sig: Take 1 tablet (10 mg total) by mouth 2 (two) times daily as needed for muscle spasms.    Dispense:  60 tablet    Refill:  2    Do not place this medication, or any other prescription from our practice, on "Automatic Refill". Patient may have prescription filled one day early if pharmacy is closed on scheduled refill date.    Order Specific Question:   Supervising Provider    Answer:   Milinda Pointer [[id number]]   New Prescriptions   No medications on file   Medications administered today: Wanda [name] had no medications administered during this visit. Lab-work, procedure(s), and/or referral(s): Orders Placed This Encounter  Procedures  . KNEE INJECTION   Imaging and/or referral(s): None  Interventional therapies: Planned, scheduled, and/or pending:   left intra-articular knee injection.   Considering:   Diagnostic/palliative left intra-articular knee injection.  Diagnostic bilateral lumbar facet block Left L4-5 lumbar epiduralsteroid injection   Palliative PRN treatment(s):   Palliative bilateral lumbar facet block Left L4-5 lumbar epiduralsteroid injection Palliative left intra-articular knee injection.    Provider-requested follow-up: Return in about 1 month (around 05/18/2017) for Procedure(NS), w/ Dr. Dossie Arbour.  Future Appointments Date Time Provider [place]  07/10/2017 10:30 AM Vevelyn Francois, NP [facility] None  09/03/2017 9:00 AM Colon Branch, MD [facility] None   Primary Care Physician: Colon Branch, MD Location: [facility]  Facility Note by: Vevelyn Francois NP Date: 04/18/2017; Time: 2:39 PM  Pain Score Disclaimer: We use the NRS-11 scale. This is a self-reported, subjective measurement of pain severity with only modest accuracy. It is used primarily to identify changes within a particular patient. It must be understood that outpatient pain scales are significantly less accurate that those used for research, where they can be applied under ideal controlled circumstances with minimal exposure to variables. In reality, the score is likely to be a combination of pain intensity and pain affect, where pain affect describes the degree of emotional arousal or changes in action readiness caused by the sensory experience of pain. Factors such as social and work situation, setting, emotional state, anxiety levels, expectation, and prior pain experience may influence pain perception and show large inter-individual differences that may also be affected by time variables.  Patient instructions provided during this appointment: Patient Instructions    ____________________________________________________________________________________________  Medication Rules  Applies to: All patients receiving prescriptions (written or electronic).  Pharmacy of record: Pharmacy where electronic prescriptions will be sent. If written prescriptions are taken to a different pharmacy, please inform the nursing staff. The pharmacy listed in the electronic medical record should be the one where you would like electronic prescriptions to be sent.  Prescription refills: Only during scheduled appointments. Applies to both, written and electronic prescriptions.  NOTE: The following applies primarily to controlled substances (Opioid* Pain Medications).   Patient's responsibilities: 1. Pain Pills: Bring all pain pills to every appointment (except for procedure appointments). 2. Pill Bottles: Bring pills in original pharmacy bottle. Always bring newest  bottle. Bring bottle, even if empty. 3. Medication refills: You are responsible for knowing and keeping track of what medications you need refilled. The day before your appointment, write a list of all prescriptions that need to be refilled. Bring that list to your appointment and give it to the admitting nurse. Prescriptions will be written only during appointments. If you forget a medication, it will not be "Called in", "Faxed", or "electronically sent". You will need to get another appointment to get these prescribed. 4. Prescription Accuracy: You are responsible for carefully inspecting your prescriptions before leaving our office. Have the discharge nurse carefully go over each prescription with you, before taking them home. Make sure that your name is accurately spelled, that your address is correct. Check the name and dose of your medication to make sure it is accurate. Check the number of pills, and the written instructions to make sure they are clear and accurate. Make sure that you are given enough medication to last until your next medication refill appointment. 5. Taking Medication: Take medication as prescribed. Never take more pills than instructed. Never take medication more frequently than prescribed. Taking less pills or less frequently is permitted and encouraged, when it comes to controlled substances (written prescriptions).  6. Inform other Doctors: Always inform, all of your healthcare providers, of all the medications you take. 7. Pain Medication from other Providers: You are not allowed to accept any additional pain medication from any other Doctor or Healthcare provider. There are two exceptions to this rule. (see below) In the event that you require additional pain medication, you are responsible for notifying us, as stated below. 8. Medication Agreement: You are responsible for carefully reading and following our Medication Agreement. This must be signed before receiving any  prescriptions from our practice. Safely store a copy of your signed Agreement. Violations to the Agreement will result in no further prescriptions. (Additional copies of our Medication Agreement are available upon request.) 9. Laws, Rules, & Regulations: All patients are expected to follow all Federal and [employer], Rules, [employer]. Ignorance of the Laws does not constitute a valid excuse. The use of any illegal substances is prohibited. 10. Adopted CDC guidelines & recommendations: Target dosing levels will be at or below 60 MME/day. Use of benzodiazepines** is not recommended.  Exceptions: There are only two exceptions to the rule of not receiving pain medications from other Healthcare Providers. 1. Exception #1 (Emergencies): In the event of an emergency (i.e.: accident requiring emergency care), you are allowed to receive additional pain medication. However, you are responsible for: As soon as you are able, call our office [phone], at any time of the day or night, and leave a message stating your name, the date and nature of the emergency, and the name and dose of the medication prescribed. In the event that your call is answered by a member of our staff, make sure to document and save the date, time, and the name of the person that took your information.  2. Exception #2 (Planned Surgery): In the event that you are scheduled by another doctor or dentist to have any type of surgery or procedure, you  are allowed (for a period no longer than 30 days), to receive additional pain medication, for the acute post-op pain. However, in this case, you are responsible for picking up a copy of our "Post-op Pain Management for Surgeons" handout, and giving it to your surgeon or dentist. This document is available at our office, and does not require an appointment to obtain it. Simply go to our office during business hours (Monday-Thursday from 8:00 AM to 4:00 PM) (Friday 8:00 AM to 12:00 Noon) or  if you have a scheduled appointment with [place], prior to your surgery, and ask for it by name. In addition, you will need to provide [place] with your name, name of your surgeon, type of surgery, and date of procedure or surgery.  *Opioid medications include: morphine, codeine, oxycodone, oxymorphone, hydrocodone, hydromorphone, meperidine, tramadol, tapentadol, buprenorphine, fentanyl, methadone. **Benzodiazepine medications include: diazepam (Valium), alprazolam (Xanax), clonazepam (Klonopine), lorazepam (Ativan), clorazepate (Tranxene), chlordiazepoxide (Librium), estazolam (Prosom), oxazepam (Serax), temazepam (Restoril), triazolam (Halcion)  ____________________________________________________________________________________________  BMI Assessment: Estimated body mass index is 41.54 kg/m as calculated from the following:   Height as of this encounter: 5\' 4"  (1.626 m).   Weight as of this encounter: 242 lb (109.8 kg).  BMI interpretation table: BMI level Category Range association with higher incidence of chronic pain  <18 kg/m2 Underweight   18.5-24.9 kg/m2 Ideal body weight   25-29.9 kg/m2 Overweight Increased incidence by 20%  30-34.9 kg/m2 Obese (Class I) Increased incidence by 68%  35-39.9 kg/m2 Severe obesity (Class II) Increased incidence by 136%  >40 kg/m2 Extreme obesity (Class III) Increased incidence by 254%   BMI Readings from Last 4 Encounters:  04/18/17 41.54 kg/m  02/20/17 40.77 kg/m  01/18/17 39.94 kg/m  01/08/17 41.20 kg/m   Wt Readings from Last 4 Encounters:  04/18/17 242 lb (109.8 kg)  02/20/17 245 lb (111.1 kg)  01/18/17 240 lb (108.9 kg)  01/08/17 240 lb (108.9 kg)   Knee Injection A knee injection is a procedure to get medicine into your knee joint. Your health care provider puts a needle into the joint and injects medicine with an attached syringe. The injected medicine may relieve the pain, swelling, and stiffness of arthritis. The injected medicine may  also help to lubricate and cushion your knee joint. You may need more than one injection. Tell a health care provider about:  Any allergies you have.  All medicines you are taking, including vitamins, herbs, eye drops, creams, and over-the-counter medicines.  Any problems you or family members have had with anesthetic medicines.  Any blood disorders you have.  Any surgeries you have had.  Any medical conditions you have. What are the risks? Generally, this is a safe procedure. However, problems may occur, including:  Infection.  Bleeding.  Worsening symptoms.  Damage to the area around your knee.  Allergic reaction to any of the medicines.  Skin reactions from repeated injections.  What happens before the procedure?  Ask your health care provider about changing or stopping your regular medicines. This is especially important if you are taking diabetes medicines or blood thinners.  Plan to have someone take you home after the procedure. What happens during the procedure?  You will sit or lie down in a position for your knee to be treated.  The skin over your kneecap will be cleaned with a germ-killing solution (antiseptic).  You will be given a medicine that numbs the area (local anesthetic). You may feel some stinging.  After your knee becomes numb, you  will have a second injection. This is the medicine. This needle is carefully placed between your kneecap and your knee. The medicine is injected into the joint space.  At the end of the procedure, the needle will be removed.  A bandage (dressing) may be placed over the injection site. The procedure may vary among health care providers and hospitals. What happens after the procedure?  You may have to move your knee through its full range of motion. This helps to get all of the medicine into your joint space.  Your blood pressure, heart rate, breathing rate, and blood oxygen level will be monitored often until the  medicines you were given have worn off.  You will be watched to make sure that you do not have a reaction to the injected medicine. This information is not intended to replace advice given to you by your health care provider. Make sure you discuss any questions you have with your health care provider. Document Released: 10/01/2006 Document Revised: 12/10/2015 Document Reviewed: 05/20/2014 Elsevier Interactive Patient Education  2018 [place]  What are the risk, side effects and possible complications? Generally speaking, most procedures are safe.  However, with any procedure there are risks, side effects, and the possibility of complications.  The risks and complications are dependent upon the sites that are lesioned, or the type of nerve block to be performed.  The closer the procedure is to the spine, the more serious the risks are.  Great care is taken when placing the radio frequency needles, block needles or lesioning probes, but sometimes complications can occur. 1. Infection: Any time there is an injection through the skin, there is a risk of infection.  This is why sterile conditions are used for these blocks.  There are four possible types of infection. 1. Localized skin infection. 2. Central Nervous System Infection-This can be in the form of Meningitis, which can be deadly. 3. Epidural Infections-This can be in the form of an epidural abscess, which can cause pressure inside of the spine, causing compression of the spinal cord with subsequent paralysis. This would require an emergency surgery to decompress, and there are no guarantees that the patient would recover from the paralysis. 4. Discitis-This is an infection of the intervertebral discs.  It occurs in about 1% of discography procedures.  It is difficult to treat and it may lead to surgery.        2. Pain: the needles have to go through skin and soft tissues, will cause soreness.        3. Damage to internal structures:  The nerves to be lesioned may be near blood vessels or    other nerves which can be potentially damaged.       4. Bleeding: Bleeding is more common if the patient is taking blood thinners such as  aspirin, Coumadin, Ticiid, Plavix, etc., or if he/she have some genetic predisposition  such as hemophilia. Bleeding into the spinal canal can cause compression of the spinal  cord with subsequent paralysis.  This would require an emergency surgery to  decompress and there are no guarantees that the patient would recover from the  paralysis.       5. Pneumothorax:  Puncturing of a lung is a possibility, every time a needle is introduced in  the area of the chest or upper back.  Pneumothorax refers to free air around the  collapsed lung(s), inside of the thoracic cavity (chest cavity).  Another two possible  complications related to a similar event would include: Hemothorax and Chylothorax.   These are variations of the Pneumothorax, where instead of air around the collapsed  lung(s), you may have blood or chyle, respectively.       6. Spinal headaches: They may occur with any procedures in the area of the spine.       7. Persistent CSF (Cerebro-Spinal Fluid) leakage: This is a rare problem, but may occur  with prolonged intrathecal or epidural catheters either due to the formation of a fistulous  track or a dural tear.       8. Nerve damage: By working so close to the spinal cord, there is always a possibility of  nerve damage, which could be as serious as a permanent spinal cord injury with  paralysis.       9. Death:  Although rare, severe deadly allergic reactions known as "Anaphylactic  reaction" can occur to any of the medications used.      10. Worsening of the symptoms:  We can always make thing worse.  What are the chances of something like this happening? Chances of any of this occuring are extremely low.  By statistics, you have more of a chance of getting killed in a  motor vehicle accident: while driving to the hospital than any of the above occurring .  Nevertheless, you should be aware that they are possibilities.  In general, it is similar to taking a shower.  Everybody knows that you can slip, hit your head and get killed.  Does that mean that you should not shower again?  Nevertheless always keep in mind that statistics do not mean anything if you happen to be on the wrong side of them.  Even if a procedure has a 1 (one) in a 1,000,000 (million) chance of going wrong, it you happen to be that one..Also, keep in mind that by statistics, you have more of a chance of having something go wrong when taking medications.  Who should not have this procedure? If you are on a blood thinning medication (e.g. Coumadin, Plavix, see list of "Blood Thinners"), or if you have an active infection going on, you should not have the procedure.  If you are taking any blood thinners, please inform your physician.  How should I prepare for this procedure?  Do not eat or drink anything at least six hours prior to the procedure.  Bring a driver with you .  It cannot be a taxi.  Come accompanied by an adult that can drive you back, and that is strong enough to help you if your legs get weak or numb from the local anesthetic.  Take all of your medicines the morning of the procedure with just enough water to swallow them.  If you have diabetes, make sure that you are scheduled to have your procedure done first thing in the morning, whenever possible.  If you have diabetes, take only half of your insulin dose and notify our nurse that you have done so as soon as you arrive at the clinic.  If you are diabetic, but only take blood sugar pills (oral hypoglycemic), then do not take them on the morning of your procedure.  You may take them after you have had the procedure.  Do not take aspirin or any aspirin-containing medications, at least eleven (11) days prior to the procedure.  They  may prolong bleeding.  Wear loose fitting clothing that may be easy to take off and that  you would not mind if it got stained with Betadine or blood.  Do not wear any jewelry or perfume  Remove any nail coloring.  It will interfere with some of our monitoring equipment.  NOTE: Remember that this is not meant to be interpreted as a complete list of all possible complications.  Unforeseen problems may occur.  BLOOD THINNERS The following drugs contain aspirin or other products, which can cause increased bleeding during surgery and should not be taken for 2 weeks prior to and 1 week after surgery.  If you should need take something for relief of minor pain, you may take acetaminophen which is found in Tylenol,m Datril, Anacin-3 and Panadol. It is not blood thinner. The products listed below are.  Do not take any of the products listed below in addition to any listed on your instruction sheet.  A.P.C or A.P.C with Codeine Codeine Phosphate Capsules #3 Ibuprofen Ridaura  ABC compound Congesprin Imuran rimadil  Advil Cope Indocin Robaxisal  Alka-Seltzer Effervescent Pain Reliever and Antacid Coricidin or Coricidin-D  Indomethacin Rufen  Alka-Seltzer plus Cold Medicine Cosprin Ketoprofen S-A-C Tablets  Anacin Analgesic Tablets or Capsules Coumadin Korlgesic Salflex  Anacin Extra Strength Analgesic tablets or capsules CP-2 Tablets Lanoril Salicylate  Anaprox Cuprimine Capsules Levenox Salocol  Anexsia-D Dalteparin Magan Salsalate  Anodynos Darvon compound Magnesium Salicylate Sine-off  Ansaid Dasin Capsules Magsal Sodium Salicylate  Anturane Depen Capsules Marnal Soma  APF Arthritis pain formula Dewitt's Pills Measurin Stanback  Argesic Dia-Gesic Meclofenamic Sulfinpyrazone  Arthritis Bayer Timed Release Aspirin Diclofenac Meclomen Sulindac  Arthritis pain formula Anacin Dicumarol Medipren Supac  Analgesic (Safety coated) Arthralgen Diffunasal Mefanamic Suprofen  Arthritis Strength Bufferin  Dihydrocodeine Mepro Compound Suprol  Arthropan liquid Dopirydamole Methcarbomol with Aspirin Synalgos  ASA tablets/Enseals Disalcid Micrainin Tagament  Ascriptin Doan's Midol Talwin  Ascriptin A/D Dolene Mobidin Tanderil  Ascriptin Extra Strength Dolobid Moblgesic Ticlid  Ascriptin with Codeine Doloprin or Doloprin with Codeine Momentum Tolectin  Asperbuf Duoprin Mono-gesic Trendar  Aspergum Duradyne Motrin or Motrin IB Triminicin  Aspirin plain, buffered or enteric coated Durasal Myochrisine Trigesic  Aspirin Suppositories Easprin Nalfon Trillsate  Aspirin with Codeine Ecotrin Regular or Extra Strength Naprosyn Uracel  Atromid-S Efficin Naproxen Ursinus  Auranofin Capsules Elmiron Neocylate Vanquish  Axotal Emagrin Norgesic Verin  Azathioprine Empirin or Empirin with Codeine Normiflo Vitamin E  Azolid Emprazil Nuprin Voltaren  Bayer Aspirin plain, buffered or children's or timed BC Tablets or powders Encaprin Orgaran Warfarin Sodium  Buff-a-Comp Enoxaparin Orudis Zorpin  Buff-a-Comp with Codeine Equegesic Os-Cal-Gesic   Buffaprin Excedrin plain, buffered or Extra Strength Oxalid   Bufferin Arthritis Strength Feldene Oxphenbutazone   Bufferin plain or Extra Strength Feldene Capsules Oxycodone with Aspirin   Bufferin with Codeine Fenoprofen Fenoprofen Pabalate or Pabalate-SF   Buffets II Flogesic Panagesic   Buffinol plain or Extra Strength Florinal or Florinal with Codeine Panwarfarin   Buf-Tabs Flurbiprofen Penicillamine   Butalbital Compound Four-way cold tablets Penicillin   Butazolidin Fragmin Pepto-Bismol   Carbenicillin Geminisyn Percodan   Carna Arthritis Reliever Geopen Persantine   Carprofen Gold's salt Persistin   Chloramphenicol Goody's Phenylbutazone   Chloromycetin Haltrain Piroxlcam   Clmetidine heparin Plaquenil   Cllnoril Hyco-pap Ponstel   Clofibrate Hydroxy chloroquine Propoxyphen         Before stopping any of these medications, be sure to consult the  physician who ordered them.  Some, such as Coumadin (Warfarin) are ordered to prevent or treat serious conditions such as "deep thrombosis", "pumonary embolisms", and other heart  problems.  The amount of time that you may need off of the medication may also vary with the medication and the reason for which you were taking it.  If you are taking any of these medications, please make sure you notify your pain physician before you undergo any procedures.

## 2017-04-24 ENCOUNTER — Telehealth: Payer: Self-pay | Admitting: Pain Medicine

## 2017-04-24 NOTE — Telephone Encounter (Signed)
Patient having a lot of lower back pain, wants to know if she can come in for a shot like they give her in the hip. Please call patient

## 2017-04-24 NOTE — Telephone Encounter (Signed)
Spoke with patient re; back pain, she is interested in having toradol/norflex injection.  Asked patient is the back pain going down her leg and she denies.  Asked if when she had facets done if they were beneficial and she states that she felt it did.  I told her that I would discuss with our NP and scheduling would call her to get her in for evaluation/medication administration.

## 2017-04-25 ENCOUNTER — Encounter: Payer: Self-pay | Admitting: Nurse Practitioner

## 2017-04-25 ENCOUNTER — Ambulatory Visit: Payer: Medicare HMO | Attending: Nurse Practitioner | Admitting: Nurse Practitioner

## 2017-04-25 VITALS — BP 138/93 | HR 80 | Temp 98.4°F | Resp 177 | Ht 64.0 in | Wt 242.0 lb

## 2017-04-25 DIAGNOSIS — Z808 Family history of malignant neoplasm of other organs or systems: Secondary | ICD-10-CM | POA: Diagnosis not present

## 2017-04-25 DIAGNOSIS — M545 Low back pain: Secondary | ICD-10-CM | POA: Diagnosis not present

## 2017-04-25 DIAGNOSIS — G894 Chronic pain syndrome: Secondary | ICD-10-CM | POA: Insufficient documentation

## 2017-04-25 DIAGNOSIS — F329 Major depressive disorder, single episode, unspecified: Secondary | ICD-10-CM | POA: Insufficient documentation

## 2017-04-25 DIAGNOSIS — Z79899 Other long term (current) drug therapy: Secondary | ICD-10-CM | POA: Insufficient documentation

## 2017-04-25 DIAGNOSIS — Z803 Family history of malignant neoplasm of breast: Secondary | ICD-10-CM | POA: Insufficient documentation

## 2017-04-25 DIAGNOSIS — M549 Dorsalgia, unspecified: Secondary | ICD-10-CM | POA: Diagnosis not present

## 2017-04-25 DIAGNOSIS — E785 Hyperlipidemia, unspecified: Secondary | ICD-10-CM | POA: Diagnosis not present

## 2017-04-25 DIAGNOSIS — M7918 Myalgia, other site: Secondary | ICD-10-CM | POA: Diagnosis not present

## 2017-04-25 DIAGNOSIS — M25552 Pain in left hip: Secondary | ICD-10-CM | POA: Insufficient documentation

## 2017-04-25 DIAGNOSIS — Z7982 Long term (current) use of aspirin: Secondary | ICD-10-CM | POA: Diagnosis not present

## 2017-04-25 DIAGNOSIS — I10 Essential (primary) hypertension: Secondary | ICD-10-CM | POA: Diagnosis not present

## 2017-04-25 DIAGNOSIS — M533 Sacrococcygeal disorders, not elsewhere classified: Secondary | ICD-10-CM | POA: Insufficient documentation

## 2017-04-25 DIAGNOSIS — M160 Bilateral primary osteoarthritis of hip: Secondary | ICD-10-CM

## 2017-04-25 DIAGNOSIS — M5442 Lumbago with sciatica, left side: Secondary | ICD-10-CM

## 2017-04-25 DIAGNOSIS — G8929 Other chronic pain: Secondary | ICD-10-CM

## 2017-04-25 DIAGNOSIS — Z833 Family history of diabetes mellitus: Secondary | ICD-10-CM | POA: Diagnosis not present

## 2017-04-25 MED ORDER — KETOROLAC TROMETHAMINE 60 MG/2ML IM SOLN
60.0000 mg | Freq: Once | INTRAMUSCULAR | Status: AC
  Administered 2017-04-25: 60 mg via INTRAMUSCULAR
  Filled 2017-04-25: qty 2

## 2017-04-25 MED ORDER — ORPHENADRINE CITRATE 30 MG/ML IJ SOLN
60.0000 mg | Freq: Once | INTRAMUSCULAR | Status: AC
  Administered 2017-04-25: 60 mg via INTRAMUSCULAR
  Filled 2017-04-25: qty 2

## 2017-04-25 NOTE — Progress Notes (Signed)
\  [id number] precautions to be maintained throughout the outpatient stay will include: orient to surroundings, keep bed in low position, maintain call bell within reach at all times, provide assistance with transfer out of bed and ambulation.

## 2017-04-25 NOTE — Telephone Encounter (Signed)
Patient has appt today 04/25/17.

## 2017-04-25 NOTE — Progress Notes (Signed)
Patient's Name: Wanda [name]  MRN: [id number]  Referring Provider: Wanda Plump, MD  DOB: Apr 28, 1946  PCP: Wanda Plump, MD  DOS: 04/25/2017  Note by: Wanda Merino NP  Service setting: Ambulatory outpatient  Specialty: Interventional Pain Management  Location: [facility] (AMB) Pain Management Facility    Patient type: Established    Primary Reason(s) for Visit: Evaluation of chronic illnesses with exacerbation, or progression (Level of risk: moderate) CC: Back Pain (low Back Pain to left hip)  HPI  [name] is a 71 y.o. year old, female patient, who comes today for a follow-up evaluation. She has Vitamin D deficiency; Dyslipidemia; Anxiety and depression; Essential hypertension; ALLERGIC RHINITIS; CLIMACTERIC STATE, FEMALE; Leg cramps; Chronic pain syndrome; Chronic low back pain (Bilateral) (L>R); Lumbar spondylosis; Chronic lumbar radicular pain (Left) (S1 dermatome); Lumbar facet syndrome (Bilateral) (R>L); Long term current use of opiate analgesic; Long term prescription opiate use; Opiate use (20 MME/Day); Myofascial pain; Fibromyalgia; Chronic knee pain (Bilateral) (L>R); Osteoarthrosis; Grade 1 (23mm) Anterolisthesis of L3 over L4.; Lumbar intervertebral disc bulge (L2-3, L3-4, L4-5, and L6-S1); 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 [name] was last seen on 04/18/2017. Her primarily concern today is the Back Pain (low Back Pain to left hip)  Pain Assessment: Location: Left, Lower  Back Radiating: Left Hip Onset: More than a month ago Duration: Chronic pain Quality: Spasm, Radiating Severity: 3 /10 (self-reported pain score)  Note: Reported level is compatible with observation.                    Effect on ADL: slowing down, cant do as much around the house Timing: Constant Modifying factors: Medication, Change in pain,meds not helping as much  Further details on both, my assessment(s), as well as the proposed treatment plan, please see below. She states that the is having increased pain in her left lower back and hip. She denies any numbness or tingling. She admits that something just happened. She admits that she is unable to move in bed secondary to the pain and this has affected her sleep.She continues to use medicine prn ; she forgets secondary to having to care for her family.   Laboratory Chemistry  Inflammation Markers (CRP: Acute Phase) (ESR: Chronic Phase) Lab Results  Component Value Date   CRP <0.5 08/18/2015   ESRSEDRATE 28 08/18/2015                 Renal Function Markers Lab Results  Component Value Date   BUN 10 08/30/2016   CREATININE 0.65 08/30/2016   GFRAA >60 08/18/2015   GFRNONAA >60 08/18/2015                 Hepatic Function Markers Lab Results  Component Value Date   AST 19 08/30/2016   ALT 19 08/30/2016   ALBUMIN 4.2 08/30/2016   ALKPHOS 74 08/30/2016   HCVAB NEGATIVE 08/30/2016                 Electrolytes Lab Results  Component Value Date   NA 136 08/30/2016   K  4.2 08/30/2016   CL 102 08/30/2016   CALCIUM 9.8 08/30/2016   MG 2.0 08/18/2015                 Neuropathy Markers No results found for: [id number]               Bone Pathology Markers Lab Results  Component Value Date   ALKPHOS 74 08/30/2016   VD25OH 37 05/01/2011   VD125OH2TOT 58 02/01/2015   NL8921JH4 58 02/01/2015   VD2125OH2 <8 02/01/2015   CALCIUM 9.8 08/30/2016                 Coagulation Parameters Lab Results  Component Value Date    PLT 176.0 08/30/2016                 Cardiovascular Markers Lab Results  Component Value Date   HGB 13.8 08/30/2016   HCT 40.9 08/30/2016                 Note: Lab results reviewed.   Lumbosacral Imaging: Lumbar MR wo contrast:  Results for orders placed during the hospital encounter of 02/20/16  MR Lumbar Spine Wo Contrast   Narrative CLINICAL DATA:  Chronic low back pain. Left gluteal pain and lower leg numbness for 20 years. Evaluation prior to spinal injection. EXAM: MRI LUMBAR SPINE WITHOUT CONTRAST TECHNIQUE: Multiplanar, multisequence MR imaging of the lumbar spine was performed. No intravenous contrast was administered. COMPARISON:  Single sagittal T1 acquisition 08/30/2015. FINDINGS: Segmentation: Nonstandard. Based on the lowest ribs on radiography 11/15/2012 there are 6 lumbar type vertebral bodies, the lowest numbered S1. This gives an open disc space at S1-S2. Ribs are not clearly visible on this exam. Alignment: L4-5 and L5-S1 grade 1 anterolisthesis from facet arthropathy. Vertebrae: No fracture, evidence of discitis, or bone lesion. A benign L2 hemangioma is noted. Conus medullaris: Extends to the L1 level and appears normal. Paraspinal and other soft tissues: At least 24 mm cyst in the left pelvis, likely ovarian. Given partial visualization sonographic workup is recommended in this postmenopausal patient. Disc levels: T12- L1: Ventral spondylotic spurring. Right foraminal to paracentral annular fissure and disc protrusion, only seen on sagittal acquisition, with foraminal fat effacement. L1-L2: Right foraminal disc protrusion with mild impingement, chronic. Ventral spondylosis. Negative facets. L2-L3: Small left foraminal protrusion which contacts the L2 nerve, chronic. Facet hypertrophy with asymmetric degenerative spurring on the right. L3-L4: Disc bulging with superimposed broad right paracentral to foraminal disc protrusion most convincing on  sagittal acquisition. Facet hypertrophy with advanced spurring. No suspected impingement. L4-L5: Bulging of the uncovered disc greatest in the right foraminal region. Advanced facet arthropathy with spurring and anterolisthesis. No suspected impingement. L5-S1:Bulging of the uncovered disc. Advanced facet arthropathy with bulky spurring and anterolisthesis. No suspected impingement. S1-S2: Disc narrowing with bulky left far-lateral spurring that does not convincingly compress the S1 nerve. Mild degenerative facet spurring. No suspected impingement. IMPRESSION: 1. 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 The lowest disc level is numbered S1-S2. Numbering scheme differs from 11/25/2012 MRI. 2. Severe facet arthropathy from L3-4 to L5-S1 with grade 1 anterolisthesis at L4-5 and L5-S1. 3. Chronic T12-L1 right foraminal protrusion with T12 impingement. 4. Chronic L1-2 right foraminal protrusion with mild impingement. 5. Chronic L2-3 left foraminal protrusion with mild impingement. 6. 24 mm left pelvic cyst, likely ovarian. Given partial visualization nonemergent sonography is recommended. Electronically Signed   By: Monte Fantasia M.D.   On: 02/20/2016  18:38   Lumbar MR wo contrast:  Results for orders placed in visit on 11/25/12  MR L Spine Ltd W/O Cm   Narrative * PRIOR REPORT IMPORTED FROM AN EXTERNAL SYSTEM *   PRIOR REPORT IMPORTED FROM THE [facility] WORKFLOW SYSTEM   REASON FOR EXAM:    Low Back Pain Lumbar Radiculitis  COMMENTS:   PROCEDURE:     MMR - MMR LUMBAR SPINE WO CONTRAST  - Nov 25 2012  1:31PM   RESULT:     History: Low back pain   Comparison Study: Lumbar spine series 11/15/2012.   Findings: Multiplanar, multisequence imaging lumbar spine is obtained. No  paraspinal lesions identified. Parapelvic renal cysts are present. No  acute  bony abnormality . Hemangiomas. The lumbar cord is normal.   T11-T12 neural foraminal  disc protrusion right is noted. There is narrowed  the right neural foramen at this level. Narrowing is prominent.    T12-L1 mild annular bulge no significant disc protrusion.   L1-L2 mild annular bulge no significant disc protrusion.   L2-L3 mild annular bulge no disc protrusion.   L3-L4 mild annular bulge no prominent disc protrusion.   L4-L5 large central disc protrusion central to left paracentral disc  protrusion with flattening of lateral recesses particular left. There  spinal  stenosis to 6.4 mm. Facet hypertrophy is present. There is bilateral  neural  frontal narrowing left side greater than right.   L5-S1 mild annular bulge L1 no significant abnormality.   IMPRESSION:      Prominent L4-L5 disc protrusion as described above.       Lumbar DG Bending views:  Results for orders placed in visit on 11/15/12  DG Lumbar Spine Complete W/Bend   Narrative * PRIOR REPORT IMPORTED FROM AN EXTERNAL SYSTEM *   PRIOR REPORT IMPORTED FROM THE [facility] WORKFLOW SYSTEM   REASON FOR EXAM:    Chronic Pain Syndrome  COMMENTS:   PROCEDURE:     DXR - DXR LSP COMPLETE W/ FLEX/EXTEN  - Nov 15 2012  1:18PM   RESULT:     History: Pain.   Comparison Study: Lumbar spine series 09/22/2011 .   Findings: Diffuse degenerative changes noted of the lumbar spine. Pedicles  are intact. No acute bony abnormality identified. Mild lumbar scoliosis  concave right noted. Degenerative change in the SI joints and both hips.  Calcification in pelvis consistent phleboliths.   IMPRESSION:      Nonspecific exam. There is diffuse degenerative change of  the lumbar spine, SI joints , and both hip joints; no acute or focal bony  lesion identified. Exam stable from prior study of 09/22/2011.       Hip Imaging:  Hip-R DG 2-3 views:  Results for orders placed during the hospital encounter of 03/22/16  DG HIP UNILAT W OR W/O PELVIS 2-3 VIEWS RIGHT   Narrative CLINICAL DATA:  Chronic pain  EXAM: PELVIS AND  BILATERAL HIPS:  4+ VIEWS  COMPARISON:  None.  FINDINGS: Standing frontal pelvis as well as standing frontal and lateral views of each hip-total five views -obtained. There is no fracture or dislocation. The joint spaces appear normal. No erosive change.  IMPRESSION: No fracture or dislocation.  No apparent arthropathy.   Electronically Signed   By: Lowella Grip III M.D.   On: 03/22/2016 11:23    Hip-L DG 2-3 views:  Results for orders placed during the hospital encounter of 03/22/16  DG HIP UNILAT W OR W/O PELVIS 2-3 VIEWS LEFT  Narrative CLINICAL DATA:  Chronic pain  EXAM: PELVIS AND BILATERAL HIPS:  4+ VIEWS  COMPARISON:  None.  FINDINGS: Standing frontal pelvis as well as standing frontal and lateral views of each hip-total five views -obtained. There is no fracture or dislocation. The joint spaces appear normal. No erosive change.  IMPRESSION: No fracture or dislocation.  No apparent arthropathy.   Electronically Signed   By: Lowella Grip III M.D.   On: 03/22/2016 11:23    Note: Results of ordered imaging test(s) reviewed and explained to patient in Layman's terms. Copy of results provided to patient.  Meds   Current Outpatient Prescriptions:  .  aspirin EC 81 MG tablet, Take 81 mg by mouth daily., Disp: , Rfl:  .  atorvastatin (LIPITOR) 10 MG tablet, Take 1 tablet (10 mg total) by mouth at bedtime., Disp: 90 tablet, Rfl: 3 .  Cholecalciferol (VITAMIN D) 2000 UNITS tablet, Take 2,000 Units by mouth daily., Disp: , Rfl:  .  cyclobenzaprine (FLEXERIL) 10 MG tablet, Take 1 tablet (10 mg total) by mouth 2 (two) times daily as needed for muscle spasms., Disp: 60 tablet, Rfl: 2 .  gabapentin (NEURONTIN) 300 MG capsule, Take 1 capsule (300 mg total) by mouth 3 (three) times daily., Disp: 90 capsule, Rfl: 2 .  hydrochlorothiazide (HYDRODIURIL) 25 MG tablet, Take 1 tablet (25 mg total) by mouth daily., Disp: 90 tablet, Rfl: 3 .  HYDROcodone-acetaminophen  (NORCO/VICODIN) 5-325 MG tablet, Take 1 tablet by mouth 3 (three) times daily., Disp: 90 tablet, Rfl: 0 .  [START ON 06/03/2017] HYDROcodone-acetaminophen (NORCO/VICODIN) 5-325 MG tablet, Take 1 tablet by mouth every 6 (six) hours as needed for severe pain., Disp: 120 tablet, Rfl: 0 .  [START ON 07/03/2017] HYDROcodone-acetaminophen (NORCO/VICODIN) 5-325 MG tablet, Take 1 tablet by mouth every 6 (six) hours as needed for severe pain., Disp: 120 tablet, Rfl: 0 .  lisinopril (PRINIVIL,ZESTRIL) 10 MG tablet, Take 1 tablet (10 mg total) by mouth daily., Disp: 90 tablet, Rfl: 3 .  magnesium oxide (MAG-OX) 400 (241.3 Mg) MG tablet, magnesium oxide 400 mg tablet, Disp: , Rfl:  .  sertraline (ZOLOFT) 50 MG tablet, Take 3 tablets (150 mg total) by mouth daily., Disp: 90 tablet, Rfl: 2 .  zolpidem (AMBIEN) 5 MG tablet, Take 1 tablet (5 mg total) by mouth at bedtime as needed for sleep., Disp: 15 tablet, Rfl: 0 .  cyclobenzaprine (FLEXERIL) 10 MG tablet, cyclobenzaprine 10 mg tablet, Disp: , Rfl:  .  magnesium oxide (MAG-OX) 400 MG tablet, Take 1 tablet (400 mg total) by mouth daily., Disp: 30 tablet, Rfl: 2 No current facility-administered medications for this visit.   Facility-Administered Medications Ordered in Other Visits:  .  orphenadrine (NORFLEX) injection 60 mg, 60 mg, Intramuscular, Once, Jerrilyn Messinger, [employer], NP  ROS  Constitutional: Denies any fever or chills Gastrointestinal: No reported hemesis, hematochezia, vomiting, or acute GI distress Musculoskeletal: Denies any acute onset joint swelling, redness, loss of ROM, or weakness Neurological: No reported episodes of acute onset apraxia, aphasia, dysarthria, agnosia, amnesia, paralysis, loss of coordination, or loss of consciousness  Allergies  Ms. Vicars is allergic to simvastatin.  Louise  Drug: Ms. Stanly  reports that she does not use drugs. Alcohol:  reports that she does not drink alcohol. Tobacco:  reports that she has never smoked. She  has never used smokeless tobacco. Medical:  has a past medical history of Allergy; Chronic back pain; Depression; DJD (degenerative joint disease); Hyperlipidemia; Hypertension; Menopause; and TMJ PAIN (05/17/2010). Surgical:  Ms. Puskas  has a past surgical history that includes G3 P3 . Family: family history includes Breast cancer in her sister; Cancer in her brother and sister; Colon cancer in her brother; Diabetes in her unknown relative.  Constitutional Exam  General appearance: Well nourished, well developed, and well hydrated. In no apparent acute distress Vitals:   04/25/17 0925  BP: (!) 138/93  Pulse: 80  Resp: (!) 177  Temp: 98.4 F (36.9 C)  TempSrc: Oral  SpO2: 99%  Weight: 242 lb (109.8 kg)  Height: _0  (1.626 m)   BMI Assessment: Estimated body mass index is 41.54 kg/m as calculated from the following:   Height as of this encounter: _1  (1.626 m).   Weight as of this encounter: 242 lb (109.8 kg).  BMI interpretation table: BMI level Category Range association with higher incidence of chronic pain  <18 kg/m2 Underweight   18.5-24.9 kg/m2 Ideal body weight   25-29.9 kg/m2 Overweight Increased incidence by 20%  30-34.9 kg/m2 Obese (Class I) Increased incidence by 68%  35-39.9 kg/m2 Severe obesity (Class II) Increased incidence by 136%  >40 kg/m2 Extreme obesity (Class III) Increased incidence by 254%   BMI Readings from Last 4 Encounters:  04/25/17 41.54 kg/m  04/18/17 41.54 kg/m  02/20/17 40.77 kg/m  01/18/17 39.94 kg/m   Wt Readings from Last 4 Encounters:  04/25/17 242 lb (109.8 kg)  04/18/17 242 lb (109.8 kg)  02/20/17 245 lb (111.1 kg)  01/18/17 240 lb (108.9 kg)  Psych/Mental status: Alert, oriented x 3 (person, place, & time)       Eyes: PERLA Respiratory: No evidence of acute respiratory distress  Cervical Spine Area Exam  Skin & Axial Inspection: No masses, redness, edema, swelling, or associated skin lesions Alignment:  Symmetrical Functional ROM: Unrestricted ROM      Stability: No instability detected Muscle Tone/Strength: Functionally intact. No obvious neuro-muscular anomalies detected. Sensory (Neurological): Unimpaired Palpation: No palpable anomalies              Upper Extremity (UE) Exam    Side: Right upper extremity  Side: Left upper extremity  Skin & Extremity Inspection: Skin color, temperature, and hair growth are WNL. No peripheral edema or cyanosis. No masses, redness, swelling, asymmetry, or associated skin lesions. No contractures.  Skin & Extremity Inspection: Skin color, temperature, and hair growth are WNL. No peripheral edema or cyanosis. No masses, redness, swelling, asymmetry, or associated skin lesions. No contractures.  Functional ROM: Unrestricted ROM          Functional ROM: Unrestricted ROM          Muscle Tone/Strength: Functionally intact. No obvious neuro-muscular anomalies detected.  Muscle Tone/Strength: Functionally intact. No obvious neuro-muscular anomalies detected.  Sensory (Neurological): Unimpaired          Sensory (Neurological): Unimpaired          Palpation: No palpable anomalies              Palpation: No palpable anomalies              Specialized Test(s): Deferred         Specialized Test(s): Deferred          Thoracic Spine Area Exam  Skin & Axial Inspection: No masses, redness, or swelling Alignment: Symmetrical Functional ROM: Unrestricted ROM Stability: No instability detected Muscle Tone/Strength: Functionally intact. No obvious neuro-muscular anomalies detected. Sensory (Neurological): Unimpaired Muscle strength & Tone: No palpable anomalies  Lumbar Spine Area Exam  Skin &  Axial Inspection: No masses, redness, or swelling Alignment: Symmetrical Functional ROM: Unrestricted ROM      Stability: No instability detected Muscle Tone/Strength: Functionally intact. No obvious neuro-muscular anomalies detected. Sensory (Neurological): Unimpaired Palpation:  Complains of area being tender to palpation       Provocative Tests: Lumbar Hyperextension and rotation test: Positive bilaterally for facet joint pain. Lumbar Lateral bending test: evaluation deferred today       Patrick's Maneuver: evaluation deferred today             due to pain  Gait & Posture Assessment  Ambulation: Patient ambulates using a cane Gait: Relatively normal for age and body habitus Posture: WNL   Lower Extremity Exam    Side: Right lower extremity  Side: Left lower extremity  Skin & Extremity Inspection: Skin color, temperature, and hair growth are WNL. No peripheral edema or cyanosis. No masses, redness, swelling, asymmetry, or associated skin lesions. No contractures.  Skin & Extremity Inspection: Skin color, temperature, and hair growth are WNL. No peripheral edema or cyanosis. No masses, redness, swelling, asymmetry, or associated skin lesions. No contractures.  Functional ROM: Unrestricted ROM          Functional ROM: Unrestricted ROM          Muscle Tone/Strength: Functionally intact. No obvious neuro-muscular anomalies detected.  Muscle Tone/Strength: Functionally intact. No obvious neuro-muscular anomalies detected.  Sensory (Neurological): Unimpaired  Sensory (Neurological): Unimpaired  Palpation: No palpable anomalies  Palpation: No palpable anomalies   Assessment  Primary Diagnosis & Pertinent Problem List: The primary encounter diagnosis was Chronic low back pain (Bilateral) (L>R). Diagnoses of Chronic sacroiliac joint pain (Right), Primary osteoarthritis of both hips, and Musculoskeletal pain were also pertinent to this visit.  Status Diagnosis  Worsening Worsening Worsening 1. Chronic low back pain (Bilateral) (L>R)   2. Chronic sacroiliac joint pain (Right)   3. Primary osteoarthritis of both hips   4. Musculoskeletal pain     Problems updated and reviewed during this visit: No problems updated. Plan of Care  Pharmacotherapy (Medications  Ordered): Meds ordered this encounter  Medications  . orphenadrine (NORFLEX) injection 60 mg  . ketorolac (TORADOL) injection 60 mg   New Prescriptions   No medications on file   Medications administered today: We administered orphenadrine and ketorolac. Lab-work, procedure(s), and/or referral(s): No orders of the defined types were placed in this encounter.  Imaging and/or referral(s): None  Interventional therapies: Planned, scheduled, and/or pending:  Not at this time,   Considering:  Diagnostic/palliative left intra-articular knee injection.  Diagnostic bilateral lumbar facet block Left L4-5 lumbar epiduralsteroid injection   Palliative PRN treatment(s):  Palliative bilateral lumbar facet block Left L4-5 lumbar epiduralsteroid injection Palliative left intra-articular knee injection   Provider-requested follow-up: No Follow-up on file.  Future Appointments Date Time Provider [place]  07/10/2017 10:30 AM Vevelyn Francois, NP [facility] None  09/03/2017 9:00 AM Colon Branch, MD [facility] None   Primary Care Physician: Colon Branch, MD Location: [facility] Outpatient [facility] Management Facility Note by: Vevelyn Francois NP Date: 04/25/2017; Time: 3:49 PM  Pain Score Disclaimer: We use the NRS-11 scale. This is a self-reported, subjective measurement of pain severity with only modest accuracy. It is used primarily to identify changes within a particular patient. It must be understood that outpatient pain scales are significantly less accurate that those used for research, where they can be applied under ideal controlled circumstances with minimal exposure to variables. In reality, the score is likely  to be a combination of pain intensity and pain affect, where pain affect describes the degree of emotional arousal or changes in action readiness caused by the sensory experience of pain. Factors such as social and work situation, setting, emotional state, anxiety levels,  expectation, and prior pain experience may influence pain perception and show large inter-individual differences that may also be affected by time variables.  Patient instructions provided during this appointment: There are no Patient Instructions on file for this visit.

## 2017-05-25 ENCOUNTER — Other Ambulatory Visit: Payer: Self-pay | Admitting: Internal Medicine

## 2017-05-25 DIAGNOSIS — N6310 Unspecified lump in the right breast, unspecified quadrant: Secondary | ICD-10-CM

## 2017-05-31 DIAGNOSIS — N83202 Unspecified ovarian cyst, left side: Secondary | ICD-10-CM | POA: Diagnosis not present

## 2017-06-01 ENCOUNTER — Ambulatory Visit
Admission: RE | Admit: 2017-06-01 | Discharge: 2017-06-01 | Disposition: A | Discharge status: Home / Self Care | Payer: Medicare HMO | Source: Ambulatory Visit | Attending: Internal Medicine | Admitting: Internal Medicine

## 2017-06-01 DIAGNOSIS — N6310 Unspecified lump in the right breast, unspecified quadrant: Secondary | ICD-10-CM

## 2017-06-01 DIAGNOSIS — R921 Mammographic calcification found on diagnostic imaging of breast: Secondary | ICD-10-CM | POA: Diagnosis not present

## 2017-07-10 ENCOUNTER — Ambulatory Visit: Payer: Medicare HMO | Attending: Nurse Practitioner | Admitting: Nurse Practitioner

## 2017-07-10 ENCOUNTER — Encounter: Payer: Self-pay | Admitting: Nurse Practitioner

## 2017-07-10 ENCOUNTER — Other Ambulatory Visit: Payer: Self-pay

## 2017-07-10 VITALS — BP 105/69 | HR 87 | Temp 98.2°F | Resp 16 | Ht 64.0 in | Wt 242.0 lb

## 2017-07-10 DIAGNOSIS — M25562 Pain in left knee: Secondary | ICD-10-CM | POA: Diagnosis not present

## 2017-07-10 DIAGNOSIS — I10 Essential (primary) hypertension: Secondary | ICD-10-CM | POA: Diagnosis not present

## 2017-07-10 DIAGNOSIS — E559 Vitamin D deficiency, unspecified: Secondary | ICD-10-CM | POA: Insufficient documentation

## 2017-07-10 DIAGNOSIS — Z79891 Long term (current) use of opiate analgesic: Secondary | ICD-10-CM | POA: Insufficient documentation

## 2017-07-10 DIAGNOSIS — E785 Hyperlipidemia, unspecified: Secondary | ICD-10-CM | POA: Diagnosis not present

## 2017-07-10 DIAGNOSIS — M533 Sacrococcygeal disorders, not elsewhere classified: Secondary | ICD-10-CM | POA: Diagnosis not present

## 2017-07-10 DIAGNOSIS — F329 Major depressive disorder, single episode, unspecified: Secondary | ICD-10-CM | POA: Diagnosis not present

## 2017-07-10 DIAGNOSIS — M79605 Pain in left leg: Secondary | ICD-10-CM | POA: Insufficient documentation

## 2017-07-10 DIAGNOSIS — M545 Low back pain: Secondary | ICD-10-CM | POA: Insufficient documentation

## 2017-07-10 DIAGNOSIS — M7918 Myalgia, other site: Secondary | ICD-10-CM | POA: Diagnosis not present

## 2017-07-10 DIAGNOSIS — M48061 Spinal stenosis, lumbar region without neurogenic claudication: Secondary | ICD-10-CM | POA: Insufficient documentation

## 2017-07-10 DIAGNOSIS — M797 Fibromyalgia: Secondary | ICD-10-CM | POA: Insufficient documentation

## 2017-07-10 DIAGNOSIS — F419 Anxiety disorder, unspecified: Secondary | ICD-10-CM | POA: Diagnosis not present

## 2017-07-10 DIAGNOSIS — Z79899 Other long term (current) drug therapy: Secondary | ICD-10-CM | POA: Diagnosis not present

## 2017-07-10 DIAGNOSIS — Z6841 Body Mass Index (BMI) 40.0 and over, adult: Secondary | ICD-10-CM | POA: Diagnosis not present

## 2017-07-10 DIAGNOSIS — M5442 Lumbago with sciatica, left side: Secondary | ICD-10-CM | POA: Diagnosis not present

## 2017-07-10 DIAGNOSIS — M170 Bilateral primary osteoarthritis of knee: Secondary | ICD-10-CM | POA: Insufficient documentation

## 2017-07-10 DIAGNOSIS — M25559 Pain in unspecified hip: Secondary | ICD-10-CM

## 2017-07-10 DIAGNOSIS — Z7982 Long term (current) use of aspirin: Secondary | ICD-10-CM | POA: Diagnosis not present

## 2017-07-10 DIAGNOSIS — M792 Neuralgia and neuritis, unspecified: Secondary | ICD-10-CM | POA: Diagnosis not present

## 2017-07-10 DIAGNOSIS — M4726 Other spondylosis with radiculopathy, lumbar region: Secondary | ICD-10-CM | POA: Diagnosis not present

## 2017-07-10 DIAGNOSIS — G8929 Other chronic pain: Secondary | ICD-10-CM | POA: Diagnosis not present

## 2017-07-10 DIAGNOSIS — G894 Chronic pain syndrome: Secondary | ICD-10-CM

## 2017-07-10 MED ORDER — HYDROCODONE-ACETAMINOPHEN 5-325 MG PO TABS: 1.0000 | ORAL_TABLET | Freq: Three times a day (TID) | ORAL | 0 refills | Status: DC

## 2017-07-10 MED ORDER — HYDROCODONE-ACETAMINOPHEN 5-325 MG PO TABS: 1.0000 | ORAL_TABLET | Freq: Four times a day (QID) | ORAL | 0 refills | Status: DC | PRN

## 2017-07-10 MED ORDER — GABAPENTIN 300 MG PO CAPS: 300.0000 mg | ORAL_CAPSULE | Freq: Three times a day (TID) | ORAL | 2 refills | Status: DC

## 2017-07-10 NOTE — Progress Notes (Signed)
Nursing Pain Medication Assessment:  Safety precautions to be maintained throughout the outpatient stay will include: orient to surroundings, keep bed in low position, maintain call bell within reach at all times, provide assistance with transfer out of bed and ambulation.  Medication Inspection Compliance: Pill count conducted under aseptic conditions, in front of the patient. Neither the pills nor the bottle was removed from the patient's sight at any time. Once count was completed pills were immediately returned to the patient in their original bottle.  Medication: Hydromorphone (Dilaudid) Pill/Patch Count: 92 of 120 pills remain Pill/Patch Appearance: Markings consistent with prescribed medication Bottle Appearance: Standard pharmacy container. Clearly labeled. Filled Date: 12/12 / 2018 Last Medication intake:  Yesterday

## 2017-07-10 NOTE — Patient Instructions (Addendum)
____________________________________________________________________________________________  Medication Rules  Applies to: All patients receiving prescriptions (written or electronic).  Pharmacy of record: Pharmacy where electronic prescriptions will be sent. If written prescriptions are taken to a different pharmacy, please inform the nursing staff. The pharmacy listed in the electronic medical record should be the one where you would like electronic prescriptions to be sent.  Prescription refills: Only during scheduled appointments. Applies to both, written and electronic prescriptions.  NOTE: The following applies primarily to controlled substances (Opioid* Pain Medications).   Patient's responsibilities: 1. Pain Pills: Bring all pain pills to every appointment (except for procedure appointments). 2. Pill Bottles: Bring pills in original pharmacy bottle. Always bring newest bottle. Bring bottle, even if empty. 3. Medication refills: You are responsible for knowing and keeping track of what medications you need refilled. The day before your appointment, write a list of all prescriptions that need to be refilled. Bring that list to your appointment and give it to the admitting nurse. Prescriptions will be written only during appointments. If you forget a medication, it will not be "Called in", "Faxed", or "electronically sent". You will need to get another appointment to get these prescribed. 4. Prescription Accuracy: You are responsible for carefully inspecting your prescriptions before leaving our office. Have the discharge nurse carefully go over each prescription with you, before taking them home. Make sure that your name is accurately spelled, that your address is correct. Check the name and dose of your medication to make sure it is accurate. Check the number of pills, and the written instructions to make sure they are clear and accurate. Make sure that you are given enough medication to  last until your next medication refill appointment. 5. Taking Medication: Take medication as prescribed. Never take more pills than instructed. Never take medication more frequently than prescribed. Taking less pills or less frequently is permitted and encouraged, when it comes to controlled substances (written prescriptions).  6. Inform other Doctors: Always inform, all of your healthcare providers, of all the medications you take. 7. Pain Medication from other Providers: You are not allowed to accept any additional pain medication from any other Doctor or Healthcare provider. There are two exceptions to this rule. (see below) In the event that you require additional pain medication, you are responsible for notifying us, as stated below. 8. Medication Agreement: You are responsible for carefully reading and following our Medication Agreement. This must be signed before receiving any prescriptions from our practice. Safely store a copy of your signed Agreement. Violations to the Agreement will result in no further prescriptions. (Additional copies of our Medication Agreement are available upon request.) 9. Laws, Rules, & Regulations: All patients are expected to follow all Federal and State Laws, Statutes, Rules, & Regulations. Ignorance of the Laws does not constitute a valid excuse. The use of any illegal substances is prohibited. 10. Adopted CDC guidelines & recommendations: Target dosing levels will be at or below 60 MME/day. Use of benzodiazepines** is not recommended.  Exceptions: There are only two exceptions to the rule of not receiving pain medications from other Healthcare Providers. 1. Exception #1 (Emergencies): In the event of an emergency (i.e.: accident requiring emergency care), you are allowed to receive additional pain medication. However, you are responsible for: As soon as you are able, call our office [phone], at any time of the day or night, and leave a message stating your  name, the date and nature of the emergency, and the name and dose of the medication   prescribed. In the event that your call is answered by a member of our staff, make sure to document and save the date, time, and the name of the person that took your information.  2. Exception #2 (Planned Surgery): In the event that you are scheduled by another doctor or dentist to have any type of surgery or procedure, you are allowed (for a period no longer than 30 days), to receive additional pain medication, for the acute post-op pain. However, in this case, you are responsible for picking up a copy of our "Post-op Pain Management for Surgeons" handout, and giving it to your surgeon or dentist. This document is available at our office, and does not require an appointment to obtain it. Simply go to our office during business hours (Monday-Thursday from 8:00 AM to 4:00 PM) (Friday 8:00 AM to 12:00 Noon) or if you have a scheduled appointment with [place], prior to your surgery, and ask for it by name. In addition, you will need to provide [place] with your name, name of your surgeon, type of surgery, and date of procedure or surgery.  *Opioid medications include: morphine, codeine, oxycodone, oxymorphone, hydrocodone, hydromorphone, meperidine, tramadol, tapentadol, buprenorphine, fentanyl, methadone. **Benzodiazepine medications include: diazepam (Valium), alprazolam (Xanax), clonazepam (Klonopine), lorazepam (Ativan), clorazepate (Tranxene), chlordiazepoxide (Librium), estazolam (Prosom), oxazepam (Serax), temazepam (Restoril), triazolam (Halcion)  ____________________________________________________________________________________________  BMI interpretation table: BMI level Category Range association with higher incidence of chronic pain  <18 kg/m2 Underweight   18.5-24.9 kg/m2 Ideal body weight   25-29.9 kg/m2 Overweight Increased incidence by 20%  30-34.9 kg/m2 Obese (Class I) Increased incidence by 68%  35-39.9 kg/m2  Severe obesity (Class II) Increased incidence by 136%  >40 kg/m2 Extreme obesity (Class III) Increased incidence by 254%   BMI Readings from Last 4 Encounters:  07/10/17 41.54 kg/m  04/25/17 41.54 kg/m  04/18/17 41.54 kg/m  02/20/17 40.77 kg/m   Wt Readings from Last 4 Encounters:  07/10/17 242 lb (109.8 kg)  04/25/17 242 lb (109.8 kg)  04/18/17 242 lb (109.8 kg)  02/20/17 245 lb (111.1 kg)   Prescription given x2 for hydrocodone.

## 2017-07-10 NOTE — Progress Notes (Signed)
Patient's Name: Wanda [name] [name]  MRN: [id number]  Referring Provider: Colon Branch, MD  DOB: 30-Oct-1945  PCP: Colon Branch, MD  DOS: 07/10/2017  Note by: Vevelyn Francois NP  Service setting: Ambulatory outpatient  Specialty: Interventional Pain Management  Location: [facility] (AMB) Pain Management Facility    Patient type: Established    Primary Reason(s) for Visit: Encounter for prescription drug management. (Level of risk: moderate)  CC: Back Pain (LOWER) and Knee Pain (LEFT)  HPI  Wanda [name] [name] is a 71 y.o. year old, female patient, who comes today for a medication management evaluation. 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, L3-4, L4-5, 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 Her primarily concern today is the Back Pain (LOWER) and Knee Pain (LEFT)  Pain Assessment: Location: Lower Back Radiating: n/a Onset: More than a month  ago Duration: Chronic pain Quality: Aching Severity: 5 /10 (self-reported pain score)  Note: Reported level is compatible with observation.                          Effect on ADL:   Timing: Constant Modifying factors: medications, lying down, hot towel  Wanda [name] [name] was last scheduled for an appointment on 04/25/2017 for medication management. During today's appointment we reviewed Wanda [name] [name]'s chronic pain status, as well as her outpatient medication regimen. She continues to have knee pain. She states that she missed her last knee injection.   The patient  reports that she does not use drugs. Her body mass index is 41.54 kg/m.  Further details on both, my assessment(s), as well as the proposed treatment plan, please see below.  Controlled Substance Pharmacotherapy Assessment REMS (Risk Evaluation and Mitigation Strategy)  Analgesic:Hydrocodone/APAP 5/325 one every 6 hours (20 mg/day of hydrocodone) MME/day:20 mg/day WLandis Martins RN  07/10/2017 10:34 AM  Sign at close encounter Nursing Pain Medication Assessment:  Safety precautions to be maintained throughout the outpatient stay will include: orient to surroundings, keep bed in low position, maintain call bell within reach at all times, provide assistance with transfer out of bed and ambulation.  Medication Inspection Compliance: Pill count conducted under aseptic conditions, in front of the patient. Neither the pills nor the bottle was removed from the patient's sight at any time. Once count was completed pills were immediately returned to the patient in their original bottle.  Medication: Hydromorphone (Dilaudid) Pill/Patch Count: 92 of 120 pills remain Pill/Patch Appearance: Markings consistent with prescribed medication Bottle Appearance: Standard pharmacy container. Clearly labeled. Filled Date: 12/12 / 2018 Last Medication intake:  Yesterday   Pharmacokinetics: Liberation and absorption (onset of action):  WNL Distribution (  time to peak effect): WNL Metabolism and excretion (duration of action): WNL         Pharmacodynamics: Desired effects: Analgesia: [name] reports >50% benefit. Functional ability: Patient reports that medication allows her to accomplish basic ADLs Clinically meaningful improvement in function (CMIF): Sustained CMIF goals met Perceived effectiveness: Described as relatively effective, allowing for increase in activities of daily living (ADL) Undesirable effects: Side-effects or Adverse reactions: None reported Monitoring: [place] PMP: Online review of the past 57-monthperiod conducted. Compliant with practice rules and regulations Last UDS on record: No results found for: SUMMARY UDS interpretation: Compliant          Medication Assessment Form: Reviewed. Patient indicates being compliant with therapy Treatment compliance: Compliant Risk Assessment Profile: Aberrant behavior: See prior evaluations. None observed or detected today Comorbid factors increasing risk of overdose: See prior notes. No additional risks detected today Risk of substance use disorder (SUD): Low Opioid Risk Tool - 07/10/17 1026      Family History of Substance Abuse   Alcohol  Negative    Illegal Drugs  Negative    Rx Drugs  Negative      Personal History of Substance Abuse   Alcohol  Negative    Illegal Drugs  Negative    Rx Drugs  Negative      Age   Age between 6969-45years   No      History of Preadolescent Sexual Abuse   History of Preadolescent Sexual Abuse  Negative or Female      Psychological Disease   Psychological Disease  Negative    Depression  Positive      Total Score   Opioid Risk Tool Scoring  1    Opioid Risk Interpretation  Low Risk      ORT Scoring interpretation table:  Score <3 = Low Risk for SUD  Score between 4-7 = Moderate Risk for SUD  Score >8 = High Risk for Opioid Abuse   Risk Mitigation Strategies:  Patient Counseling: Covered Patient-Prescriber  Agreement (PPA): Present and active  Notification to other healthcare providers: Done  Pharmacologic Plan: No change in therapy, at this time  Laboratory Chemistry  Inflammation Markers (CRP: Acute Phase) (ESR: Chronic Phase) Lab Results  Component Value Date   CRP <0.5 08/18/2015   ESRSEDRATE 28 08/18/2015                 Rheumatology Markers No results found for: [facility]             Renal Function Markers Lab Results  Component Value Date   BUN 10 08/30/2016   CREATININE 0.65 08/30/2016   GFRAA >60 08/18/2015   GFRNONAA >60 08/18/2015                 Hepatic Function Markers Lab Results  Component Value Date   AST 19 08/30/2016   ALT 19 08/30/2016   ALBUMIN 4.2 08/30/2016   ALKPHOS 74 08/30/2016   HCVAB NEGATIVE 08/30/2016                 Electrolytes Lab Results  Component Value Date   NA 136 08/30/2016   K 4.2 08/30/2016   CL 102 08/30/2016   CALCIUM 9.8 08/30/2016   MG 2.0 08/18/2015                 Neuropathy Markers No results found for: VITAMINB12, FOLATE, HGBA1C, HIV  Bone Pathology Markers Lab Results  Component Value Date   VD25OH 37 05/01/2011   VD125OH2TOT 58 02/01/2015   [id number] 58 02/01/2015   [id number] <8 02/01/2015                 Coagulation Parameters Lab Results  Component Value Date   PLT 176.0 08/30/2016                 Cardiovascular Markers Lab Results  Component Value Date   HGB 13.8 08/30/2016   HCT 40.9 08/30/2016                 CA Markers No results found for: CEA, CA125, LABCA2               Note: Lab results reviewed.  Recent Diagnostic Imaging Results  MM DIAG BREAST TOMO BILATERAL CLINICAL DATA:  Annual examination of both breasts and follow-up of probably benign calcifications in the outer left breast.  EXAM: 2D DIGITAL DIAGNOSTIC BILATERAL MAMMOGRAM WITH CAD AND ADJUNCT TOMO  COMPARISON:  Previous exam(s).  ACR Breast Density Category b:  There are scattered areas of fibroglandular density.  FINDINGS: The coarse dystrophic benign-appearing calcifications in the upper-outer left breast are unchanged. No suspicious microcalcifications are identified in either breast to suggest malignancy. No mass or architectural distortion is identified in either breast to suggest malignancy.  Mammographic images were processed with CAD.  IMPRESSION: Stable benign appearing dystrophic calcifications in the upper-outer left breast. No evidence of malignancy in either breast.  RECOMMENDATION: Screening mammogram in one year.(Code:[id number])  I have discussed the findings and recommendations with the patient. Results were also provided in writing at the conclusion of the visit. If applicable, a reminder letter will be sent to the patient regarding the next appointment.  BI-RADS CATEGORY  2: Benign.  Electronically Signed   By: Curlene Dolphin M.D.   On: 06/01/2017 15:32  Complexity Note: Imaging results reviewed. Results shared with Ms. Schremp, using Layman's terms.                         Meds   Current Outpatient Medications:  .  aspirin EC 81 MG tablet, Take 81 mg by mouth daily., Disp: , Rfl:  .  atorvastatin (LIPITOR) 10 MG tablet, Take 1 tablet (10 mg total) by mouth at bedtime., Disp: 90 tablet, Rfl: 3 .  Cholecalciferol (VITAMIN D) 2000 UNITS tablet, Take 2,000 Units by mouth daily., Disp: , Rfl:  .  cyclobenzaprine (FLEXERIL) 10 MG tablet, cyclobenzaprine 10 mg tablet, Disp: , Rfl:  .  [START ON 08/02/2017] gabapentin (NEURONTIN) 300 MG capsule, Take 1 capsule (300 mg total) by mouth 3 (three) times daily., Disp: 90 capsule, Rfl: 2 .  hydrochlorothiazide (HYDRODIURIL) 25 MG tablet, Take 1 tablet (25 mg total) by mouth daily., Disp: 90 tablet, Rfl: 3 .  [START ON 10/01/2017] HYDROcodone-acetaminophen (NORCO/VICODIN) 5-325 MG tablet, Take 1 tablet by mouth every 6 (six) hours as needed for severe pain., Disp: 120 tablet, Rfl:  0 .  lisinopril (PRINIVIL,ZESTRIL) 10 MG tablet, Take 1 tablet (10 mg total) by mouth daily., Disp: 90 tablet, Rfl: 3 .  magnesium oxide (MAG-OX) 400 (241.3 Mg) MG tablet, magnesium oxide 400 mg tablet, Disp: , Rfl:  .  sertraline (ZOLOFT) 50 MG tablet, Take 3 tablets (150 mg total) by mouth daily., Disp: 90 tablet, Rfl: 2 .  [START ON 09/01/2017] HYDROcodone-acetaminophen (NORCO/VICODIN) 5-325 MG tablet, Take 1 tablet by mouth 3 (three) times  daily., Disp: 90 tablet, Rfl: 0 .  [START ON 08/02/2017] HYDROcodone-acetaminophen (NORCO/VICODIN) 5-325 MG tablet, Take 1 tablet by mouth every 6 (six) hours as needed for severe pain., Disp: 120 tablet, Rfl: 0 .  magnesium oxide (MAG-OX) 400 MG tablet, Take 1 tablet (400 mg total) by mouth daily., Disp: 30 tablet, Rfl: 2  ROS  Constitutional: Denies any fever or chills Gastrointestinal: No reported hemesis, hematochezia, vomiting, or acute GI distress Musculoskeletal: Denies any acute onset joint swelling, redness, loss of ROM, or weakness Neurological: No reported episodes of acute onset apraxia, aphasia, dysarthria, agnosia, amnesia, paralysis, loss of coordination, or loss of consciousness  Allergies  Ms. Lozinski is allergic to simvastatin.  [place]  Drug: Ms. Dillin  reports that she does not use drugs. Alcohol:  reports that she does not drink alcohol. Tobacco:  reports that  has never smoked. she has never used smokeless tobacco. Medical:  has a past medical history of Allergy, Chronic back pain, Depression, DJD (degenerative joint disease), Hyperlipidemia, Hypertension, Menopause, and TMJ PAIN (05/17/2010). Surgical: Ms. Frankowski  has a past surgical history that includes G3 P3 . Family: family history includes Breast cancer in her sister; Cancer in her brother and sister; Colon cancer in her brother; Diabetes in her unknown relative.  Constitutional Exam  General appearance: Well nourished, well developed, and well hydrated. In no apparent acute  distress Vitals:   07/10/17 1021  BP: 105/69  Pulse: 87  Resp: 16  Temp: 98.2 F (36.8 C)  TempSrc: Oral  SpO2: 100%  Weight: 242 lb (109.8 kg)  Height: '5\' 4"'$  (1.626 m)   BMI Assessment: Estimated body mass index is 41.54 kg/m as calculated from the following:   Height as of this encounter: '5\' 4"'$  (1.626 m).   Weight as of this encounter: 242 lb (109.8 kg).  Psych/Mental status: Alert, oriented x 3 (person, place, & time)       Eyes: PERLA Respiratory: No evidence of acute respiratory distress  Cervical Spine Area Exam  Skin & Axial Inspection: No masses, redness, edema, swelling, or associated skin lesions Alignment: Symmetrical Functional ROM: Unrestricted ROM      Stability: No instability detected Muscle Tone/Strength: Functionally intact. No obvious neuro-muscular anomalies detected. Sensory (Neurological): Unimpaired Palpation: No palpable anomalies              Upper Extremity (UE) Exam    Side: Right upper extremity  Side: Left upper extremity  Skin & Extremity Inspection: Skin color, temperature, and hair growth are WNL. No peripheral edema or cyanosis. No masses, redness, swelling, asymmetry, or associated skin lesions. No contractures.  Skin & Extremity Inspection: Skin color, temperature, and hair growth are WNL. No peripheral edema or cyanosis. No masses, redness, swelling, asymmetry, or associated skin lesions. No contractures.  Functional ROM: Unrestricted ROM          Functional ROM: Unrestricted ROM          Muscle Tone/Strength: Functionally intact. No obvious neuro-muscular anomalies detected.  Muscle Tone/Strength: Functionally intact. No obvious neuro-muscular anomalies detected.  Sensory (Neurological): Unimpaired          Sensory (Neurological): Unimpaired          Palpation: No palpable anomalies              Palpation: No palpable anomalies              Specialized Test(s): Deferred         Specialized Test(s): Deferred  Thoracic Spine Area Exam   Skin & Axial Inspection: No masses, redness, or swelling Alignment: Symmetrical Functional ROM: Unrestricted ROM Stability: No instability detected Muscle Tone/Strength: Functionally intact. No obvious neuro-muscular anomalies detected. Sensory (Neurological): Unimpaired Muscle strength & Tone: No palpable anomalies  Lumbar Spine Area Exam  Skin & Axial Inspection: No masses, redness, or swelling Alignment: Symmetrical Functional ROM: Unrestricted ROM      Stability: No instability detected Muscle Tone/Strength: Functionally intact. No obvious neuro-muscular anomalies detected. Sensory (Neurological): Unimpaired Palpation: No palpable anomalies       Provocative Tests: Lumbar Hyperextension and rotation test: evaluation deferred today       Lumbar Lateral bending test: evaluation deferred today       Patrick's Maneuver: evaluation deferred today                    Gait & Posture Assessment  Ambulation: Patient ambulates using a cane Gait: Relatively normal for age and body habitus Posture: WNL   Lower Extremity Exam    Side: Right lower extremity  Side: Left lower extremity  Skin & Extremity Inspection: Skin color, temperature, and hair growth are WNL. No peripheral edema or cyanosis. No masses, redness, swelling, asymmetry, or associated skin lesions. No contractures.  Skin & Extremity Inspection: Edema  Functional ROM: Unrestricted ROM          Functional ROM: Decreased ROM          Muscle Tone/Strength: Functionally intact. No obvious neuro-muscular anomalies detected.  Muscle Tone/Strength: Functionally intact. No obvious neuro-muscular anomalies detected.  Sensory (Neurological): Unimpaired  Sensory (Neurological): Unimpaired  Palpation: No palpable anomalies  Palpation: No palpable anomalies   Assessment  Primary Diagnosis & Pertinent Problem List: The primary encounter diagnosis was Chronic low back pain (Bilateral) (L>R). Diagnoses of Chronic hip pain, unspecified  laterality, Chronic pain of left knee, Chronic pain syndrome, Neurogenic pain, Fibromyalgia, and Long term prescription opiate use were also pertinent to this visit.  Status Diagnosis  Controlled Controlled Persistent 1. Chronic low back pain (Bilateral) (L>R)   2. Chronic hip pain, unspecified laterality   3. Chronic pain of left knee   4. Chronic pain syndrome   5. Neurogenic pain   6. Fibromyalgia   7. Long term prescription opiate use     Problems updated and reviewed during this visit: No problems updated. Plan of Care  Pharmacotherapy (Medications Ordered): Meds ordered this encounter  Medications  . HYDROcodone-acetaminophen (NORCO/VICODIN) 5-325 MG tablet    Sig: Take 1 tablet by mouth 3 (three) times daily.    Dispense:  90 tablet    Refill:  0    Do not place this medication, or any other prescription from our practice, on "Automatic Refill". Patient may have prescription filled one day early if pharmacy is closed on scheduled refill date. Do not fill until:09/01/2017 To last until:10/01/2017    Order Specific Question:   Supervising Provider    Answer:   Milinda Pointer [phone]  . HYDROcodone-acetaminophen (NORCO/VICODIN) 5-325 MG tablet    Sig: Take 1 tablet by mouth every 6 (six) hours as needed for severe pain.    Dispense:  120 tablet    Refill:  0    Do not place this medication, or any other prescription from our practice, on "Automatic Refill". Patient may have prescription filled one day early if pharmacy is closed on scheduled refill date. Do not fill until: 08/02/2017 To last until:09/01/2017    Order Specific Question:   Supervising  Provider    Answer:   Milinda Pointer [phone]  . gabapentin (NEURONTIN) 300 MG capsule    Sig: Take 1 capsule (300 mg total) by mouth 3 (three) times daily.    Dispense:  90 capsule    Refill:  2    Do not place this medication, or any other prescription from our practice, on "Automatic Refill". Patient may have  prescription filled one day early if pharmacy is closed on scheduled refill date.    Order Specific Question:   Supervising Provider    Answer:   Milinda Pointer [phone]  . HYDROcodone-acetaminophen (NORCO/VICODIN) 5-325 MG tablet    Sig: Take 1 tablet by mouth every 6 (six) hours as needed for severe pain.    Dispense:  120 tablet    Refill:  0    Do not place this medication, or any other prescription from our practice, on "Automatic Refill". Patient may have prescription filled one day early if pharmacy is closed on scheduled refill date. Do not fill until: 10/01/2017 To last until: 10/31/2017    Order Specific Question:   Supervising Provider    Answer:   Milinda Pointer [phone]  This SmartLink is deprecated. Use AVSMEDLIST instead to display the medication list for a patient. Medications administered today: Aerith L. Pigford had no medications administered during this visit. Lab-work, procedure(s), and/or referral(s): Orders Placed This Encounter  Procedures  . ToxASSURE Select 13 (MW), Urine   Imaging and/or referral(s): None  Interventional therapies: Planned, scheduled, and/or pending:  left intra-articular knee injection.   Considering:  Diagnostic/palliative left intra-articular knee injection.  Diagnostic bilateral lumbar facet block Left L4-5 lumbar epiduralsteroid injection   Palliative PRN treatment(s):  Palliative bilateral lumbar facet block Left L4-5 lumbar epiduralsteroid injection Palliative left intra-articular knee injection  \ Provider-requested follow-up: Return in about 3 months (around 10/08/2017) for MedMgmt with Me Dionisio David), in addition, Procedure(w/Sedation), w/ Dr. Dossie Arbour.  Future Appointments  Date Time Provider [place]  09/03/2017  9:00 AM Colon Branch, MD [facility]  10/03/2017 10:30 AM Vevelyn Francois, NP [facility] None   Primary Care Physician: Colon Branch, MD Location: [facility] Outpatient [facility] Management  Facility Note by: Vevelyn Francois NP Date: 07/10/2017; Time: 3:29 PM  Pain Score Disclaimer: We use the NRS-11 scale. This is a self-reported, subjective measurement of pain severity with only modest accuracy. It is used primarily to identify changes within a particular patient. It must be understood that outpatient pain scales are significantly less accurate that those used for research, where they can be applied under ideal controlled circumstances with minimal exposure to variables. In reality, the score is likely to be a combination of pain intensity and pain affect, where pain affect describes the degree of emotional arousal or changes in action readiness caused by the sensory experience of pain. Factors such as social and work situation, setting, emotional state, anxiety levels, expectation, and prior pain experience may influence pain perception and show large inter-individual differences that may also be affected by time variables.  Patient instructions provided during this appointment: Patient Instructions    ____________________________________________________________________________________________  Medication Rules  Applies to: All patients receiving prescriptions (written or electronic).  Pharmacy of record: Pharmacy where electronic prescriptions will be sent. If written prescriptions are taken to a different pharmacy, please inform the nursing staff. The pharmacy listed in the electronic medical record should be the one where you would like electronic prescriptions to be sent.  Prescription refills: Only during scheduled appointments. Applies  to both, written and electronic prescriptions.  NOTE: The following applies primarily to controlled substances (Opioid* Pain Medications).   Patient's responsibilities: 1. Pain Pills: Bring all pain pills to every appointment (except for procedure appointments). 2. Pill Bottles: Bring pills in original pharmacy bottle. Always bring newest  bottle. Bring bottle, even if empty. 3. Medication refills: You are responsible for knowing and keeping track of what medications you need refilled. The day before your appointment, write a list of all prescriptions that need to be refilled. Bring that list to your appointment and give it to the admitting nurse. Prescriptions will be written only during appointments. If you forget a medication, it will not be "Called in", "Faxed", or "electronically sent". You will need to get another appointment to get these prescribed. 4. Prescription Accuracy: You are responsible for carefully inspecting your prescriptions before leaving our office. Have the discharge nurse carefully go over each prescription with you, before taking them home. Make sure that your name is accurately spelled, that your address is correct. Check the name and dose of your medication to make sure it is accurate. Check the number of pills, and the written instructions to make sure they are clear and accurate. Make sure that you are given enough medication to last until your next medication refill appointment. 5. Taking Medication: Take medication as prescribed. Never take more pills than instructed. Never take medication more frequently than prescribed. Taking less pills or less frequently is permitted and encouraged, when it comes to controlled substances (written prescriptions).  6. Inform other Doctors: Always inform, all of your healthcare providers, of all the medications you take. 7. Pain Medication from other Providers: You are not allowed to accept any additional pain medication from any other Doctor or Healthcare provider. There are two exceptions to this rule. (see below) In the event that you require additional pain medication, you are responsible for notifying us, as stated below. 8. Medication Agreement: You are responsible for carefully reading and following our Medication Agreement. This must be signed before receiving any  prescriptions from our practice. Safely store a copy of your signed Agreement. Violations to the Agreement will result in no further prescriptions. (Additional copies of our Medication Agreement are available upon request.) 9. Laws, Rules, & Regulations: All patients are expected to follow all Federal and [employer], Rules, [employer]. Ignorance of the Laws does not constitute a valid excuse. The use of any illegal substances is prohibited. 10. Adopted CDC guidelines & recommendations: Target dosing levels will be at or below 60 MME/day. Use of benzodiazepines** is not recommended.  Exceptions: There are only two exceptions to the rule of not receiving pain medications from other Healthcare Providers. 1. Exception #1 (Emergencies): In the event of an emergency (i.e.: accident requiring emergency care), you are allowed to receive additional pain medication. However, you are responsible for: As soon as you are able, call our office [phone], at any time of the day or night, and leave a message stating your name, the date and nature of the emergency, and the name and dose of the medication prescribed. In the event that your call is answered by a member of our staff, make sure to document and save the date, time, and the name of the person that took your information.  2. Exception #2 (Planned Surgery): In the event that you are scheduled by another doctor or dentist to have any type of surgery or procedure, you are allowed (for a period no longer  than 30 days), to receive additional pain medication, for the acute post-op pain. However, in this case, you are responsible for picking up a copy of our "Post-op Pain Management for Surgeons" handout, and giving it to your surgeon or dentist. This document is available at our office, and does not require an appointment to obtain it. Simply go to our office during business hours (Monday-Thursday from 8:00 AM to 4:00 PM) (Friday 8:00 AM to 12:00 Noon) or  if you have a scheduled appointment with [place], prior to your surgery, and ask for it by name. In addition, you will need to provide [place] with your name, name of your surgeon, type of surgery, and date of procedure or surgery.  *Opioid medications include: morphine, codeine, oxycodone, oxymorphone, hydrocodone, hydromorphone, meperidine, tramadol, tapentadol, buprenorphine, fentanyl, methadone. **Benzodiazepine medications include: diazepam (Valium), alprazolam (Xanax), clonazepam (Klonopine), lorazepam (Ativan), clorazepate (Tranxene), chlordiazepoxide (Librium), estazolam (Prosom), oxazepam (Serax), temazepam (Restoril), triazolam (Halcion)  ____________________________________________________________________________________________  BMI interpretation table: BMI level Category Range association with higher incidence of chronic pain  <18 kg/m2 Underweight   18.5-24.9 kg/m2 Ideal body weight   25-29.9 kg/m2 Overweight Increased incidence by 20%  30-34.9 kg/m2 Obese (Class I) Increased incidence by 68%  35-39.9 kg/m2 Severe obesity (Class II) Increased incidence by 136%  >40 kg/m2 Extreme obesity (Class III) Increased incidence by 254%   BMI Readings from Last 4 Encounters:  07/10/17 41.54 kg/m  04/25/17 41.54 kg/m  04/18/17 41.54 kg/m  02/20/17 40.77 kg/m   Wt Readings from Last 4 Encounters:  07/10/17 242 lb (109.8 kg)  04/25/17 242 lb (109.8 kg)  04/18/17 242 lb (109.8 kg)  02/20/17 245 lb (111.1 kg)   Prescription given x2 for hydrocodone.

## 2017-07-14 LAB — TOXASSURE SELECT 13 (MW), URINE

## 2017-07-26 ENCOUNTER — Ambulatory Visit: Payer: Self-pay

## 2017-07-26 NOTE — Telephone Encounter (Signed)
thx

## 2017-07-26 NOTE — Telephone Encounter (Signed)
Pt calling asking for help dealing with multiple life stressors. Pt's husband has several health problems (cancer, CVA, heart problems, dementia). Pt states his personality has changed and he can be "harsh" to her verbally. She stated that her son was threatening suicide 2 weeks ago. Pt states she has had depression for many years and that she is presently taking Zoloft for it. She states she feels her children are in denial about their father's health and are unsupportive. Pt denies suicidal/homicidal ideation.  Appt changed for Monday to tomorrow at 4 pm with Dr  Carollee Herter. Pt given number for [facility]. Was going to conference call pt in, but stated husband is coming home and she stated she will call to arrange appt.  Reason for Disposition . [1] Depression AND [2] worsening (e.g.,sleeping poorly, less able to do activities of daily living)  Answer Assessment - Initial Assessment Questions 1. CONCERN: "What happened that made you call today?"     Depression, needs therapist feeling overwhelmed 2. DEPRESSION SYMPTOM SCREENING: "How are you feeling overall?" (e.g., decreased energy, increased sleeping or difficulty sleeping, difficulty concentrating, feelings of sadness, guilt, hopelessness, or worthlessness)     Feelings of sadness. Difficulty sleeping and guilt 3. RISK OF HARM - SUICIDAL IDEATION:  "Do you ever have thoughts of hurting or killing yourself?"  (e.g., yes, no, no but preoccupation with thoughts about death)   - INTENT:  "Do you have thoughts of hurting or killing yourself right NOW?" (e.g., yes, no, N/A)   - PLAN: "Do you have a specific plan for how you would do this?" (e.g., gun, knife, overdose, no plan, N/A)     no 4. RISK OF HARM - HOMICIDAL IDEATION:  "Do you ever have thoughts of hurting or killing someone else?"  (e.g., yes, no, no but preoccupation with thoughts about death)   - INTENT:  "Do you have thoughts of hurting or killing someone right NOW?" (e.g.,  yes, no, N/A)   - PLAN: "Do you have a specific plan for how you would do this?" (e.g., gun, knife, no plan, N/A)      no 5. FUNCTIONAL IMPAIRMENT: "How have things been going for you overall in your life? Have you had any more difficulties than usual doing your normal daily activities?"  (e.g., better, same, worse; self-care, school, work, interactions)     Things life dealing with sick husband and lack of family support 6. SUPPORT: "Who is with you now?" "Who do you live with?" "Do you have family or friends nearby who you can talk to?"      Son- Lives with Son and husband 7. THERAPIST: "Do you have a counselor or therapist? Name?"     no 8. STRESSORS: "Has there been any new stress or recent changes in your life?"     Dealing with husbands illness and lack of support for family 9. DRUG ABUSE/ALCOHOL: "Do you drink alcohol or use any illegal drugs?"      no 10. OTHER: "Do you have any other health or medical symptoms right now?" (e.g., fever)       Bad back and knees and high cholesterol and HTN,  11. PREGNANCY: "Is there any chance you are pregnant?" "When was your last menstrual period?"       n/a  Protocols used: DEPRESSION-A-AH

## 2017-07-26 NOTE — Telephone Encounter (Signed)
FYI

## 2017-07-27 ENCOUNTER — Encounter: Payer: Self-pay | Admitting: Family Medicine

## 2017-07-27 ENCOUNTER — Ambulatory Visit (INDEPENDENT_AMBULATORY_CARE_PROVIDER_SITE_OTHER): Payer: Medicare HMO | Admitting: Family Medicine

## 2017-07-27 VITALS — BP 120/80 | HR 83 | Temp 98.8°F | Ht 64.0 in | Wt 255.0 lb

## 2017-07-27 DIAGNOSIS — F418 Other specified anxiety disorders: Secondary | ICD-10-CM

## 2017-07-27 NOTE — Assessment & Plan Note (Signed)
Pt was not taking zoloft regularly Encouraged her to take 50 mg 3 a day  Gave pt number for [facility] for counseling F/u pcp 2-3 weeks or sooner prn
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
## 2017-07-27 NOTE — Patient Instructions (Signed)
Living With Depression Everyone experiences occasional disappointment, sadness, and loss in their lives. When you are feeling down, blue, or sad for at least 2 weeks in a row, it may mean that you have depression. Depression can affect your thoughts and feelings, relationships, daily activities, and physical health. It is caused by changes in the way your brain functions. If you receive a diagnosis of depression, your health care provider will tell you which type of depression you have and what treatment options are available to you. If you are living with depression, there are ways to help you recover from it and also ways to prevent it from coming back. How to cope with lifestyle changes Coping with stress Stress is your body's reaction to life changes and events, both good and bad. Stressful situations may include:  Getting married.  The death of a spouse.  Losing a job.  Retiring.  Having a baby.  Stress can last just a few hours or it can be ongoing. Stress can play a major role in depression, so it is important to learn both how to cope with stress and how to think about it differently. Talk with your health care provider or a counselor if you would like to learn more about stress reduction. He or she may suggest some stress reduction techniques, such as:  Music therapy. This can include creating music or listening to music. Choose music that you enjoy and that inspires you.  Mindfulness-based meditation. This kind of meditation can be done while sitting or walking. It involves being aware of your normal breaths, rather than trying to control your breathing.  Centering prayer. This is a kind of meditation that involves focusing on a spiritual word or phrase. Choose a word, phrase, or sacred image that is meaningful to you and that brings you peace.  Deep breathing. To do this, expand your stomach and inhale slowly through your nose. Hold your breath for 3-5 seconds, then exhale  slowly, allowing your stomach muscles to relax.  Muscle relaxation. This involves intentionally tensing muscles then relaxing them.  Choose a stress reduction technique that fits your lifestyle and personality. Stress reduction techniques take time and practice to develop. Set aside 5-15 minutes a day to do them. Therapists can offer training in these techniques. The training may be covered by some insurance plans. Other things you can do to manage stress include:  Keeping a stress diary. This can help you learn what triggers your stress and ways to control your response.  Understanding what your limits are and saying no to requests or events that lead to a schedule that is too full.  Thinking about how you respond to certain situations. You may not be able to control everything, but you can control how you react.  Adding humor to your life by watching funny films or TV shows.  Making time for activities that help you relax and not feeling guilty about spending your time this way.  Medicines Your health care provider may suggest certain medicines if he or she feels that they will help improve your condition. Avoid using alcohol and other substances that may prevent your medicines from working properly (may interact). It is also important to:  Talk with your pharmacist or health care provider about all the medicines that you take, their possible side effects, and what medicines are safe to take together.  Make it your goal to take part in all treatment decisions (shared decision-making). This includes giving input on the side   effects of medicines. It is best if shared decision-making with your health care provider is part of your total treatment plan.  If your health care provider prescribes a medicine, you may not notice the full benefits of it for 4-8 weeks. Most people who are treated for depression need to be on medicine for at least 6-12 months after they feel better. If you are taking  medicines as part of your treatment, do not stop taking medicines without first talking to your health care provider. You may need to have the medicine slowly decreased (tapered) over time to decrease the risk of harmful side effects. Relationships Your health care provider may suggest family therapy along with individual therapy and drug therapy. While there may not be family problems that are causing you to feel depressed, it is still important to make sure your family learns as much as they can about your mental health. Having your family's support can help make your treatment successful. How to recognize changes in your condition Everyone has a different response to treatment for depression. Recovery from major depression happens when you have not had signs of major depression for two months. This may mean that you will start to:  Have more interest in doing activities.  Feel less hopeless than you did 2 months ago.  Have more energy.  Overeat less often, or have better or improving appetite.  Have better concentration.  Your health care provider will work with you to decide the next steps in your recovery. It is also important to recognize when your condition is getting worse. Watch for these signs:  Having fatigue or low energy.  Eating too much or too little.  Sleeping too much or too little.  Feeling restless, agitated, or hopeless.  Having trouble concentrating or making decisions.  Having unexplained physical complaints.  Feeling irritable, angry, or aggressive.  Get help as soon as you or your family members notice these symptoms coming back. How to get support and help from others How to talk with friends and family members about your condition Talking to friends and family members about your condition can provide you with one way to get support and guidance. Reach out to trusted friends or family members, explain your symptoms to them, and let them know that you are  working with a health care provider to treat your depression. Financial resources Not all insurance plans cover mental health care, so it is important to check with your insurance carrier. If paying for co-pays or counseling services is a problem, search for a local or county mental health care center. They may be able to offer public mental health care services at low or no cost when you are not able to see a private health care provider. If you are taking medicine for depression, you may be able to get the generic form, which may be less expensive. Some makers of prescription medicines also offer help to patients who cannot afford the medicines they need. Follow these instructions at home:  Get the right amount and quality of sleep.  Cut down on using caffeine, tobacco, alcohol, and other potentially harmful substances.  Try to exercise, such as walking or lifting small weights.  Take over-the-counter and prescription medicines only as told by your health care provider.  Eat a healthy diet that includes plenty of vegetables, fruits, whole grains, low-fat dairy products, and lean protein. Do not eat a lot of foods that are high in solid fats, added sugars, or salt.    Keep all follow-up visits as told by your health care provider. This is important. Contact a health care provider if:  You stop taking your antidepressant medicines, and you have any of these symptoms: ? Nausea. ? Headache. ? Feeling lightheaded. ? Chills and body aches. ? Not being able to sleep (insomnia).  You or your friends and family think your depression is getting worse. Get help right away if:  You have thoughts of hurting yourself or others. If you ever feel like you may hurt yourself or others, or have thoughts about taking your own life, get help right away. You can go to your nearest emergency department or call:  Your local emergency services (911 in the [place]  A suicide crisis helpline, such as the  National Suicide Prevention Lifeline at 1-800-273-8255. This is open 24-hours a day.  Summary  If you are living with depression, there are ways to help you recover from it and also ways to prevent it from coming back.  Work with your health care team to create a management plan that includes counseling, stress management techniques, and healthy lifestyle habits. This information is not intended to replace advice given to you by your health care provider. Make sure you discuss any questions you have with your health care provider. Document Released: 06/12/2016 Document Revised: 06/12/2016 Document Reviewed: 06/12/2016 Elsevier Interactive Patient Education  2018 [employer]  

## 2017-07-30 ENCOUNTER — Ambulatory Visit: Payer: Medicare HMO | Admitting: Internal Medicine

## 2017-09-03 ENCOUNTER — Encounter: Payer: Medicare HMO | Admitting: Internal Medicine

## 2017-09-03 DIAGNOSIS — Z0289 Encounter for other administrative examinations: Secondary | ICD-10-CM

## 2017-09-04 ENCOUNTER — Ambulatory Visit: Payer: Medicare HMO | Admitting: Psychology

## 2017-09-04 ENCOUNTER — Encounter: Payer: Self-pay | Admitting: Internal Medicine

## 2017-09-04 DIAGNOSIS — F331 Major depressive disorder, recurrent, moderate: Secondary | ICD-10-CM

## 2017-09-12 ENCOUNTER — Encounter: Payer: Self-pay | Admitting: Gastroenterology

## 2017-09-20 ENCOUNTER — Telehealth: Payer: Self-pay | Admitting: *Deleted

## 2017-09-20 NOTE — Telephone Encounter (Signed)
Last script for Gabapentin wrote 08-02-17, with 2 refills. Attempted to call patient to notify her of this. Message left.

## 2017-10-03 ENCOUNTER — Encounter: Payer: Medicare HMO | Admitting: Nurse Practitioner

## 2017-10-05 ENCOUNTER — Ambulatory Visit: Payer: Medicare HMO | Admitting: Psychology

## 2017-10-11 ENCOUNTER — Other Ambulatory Visit: Payer: Self-pay | Admitting: Internal Medicine

## 2017-10-23 ENCOUNTER — Encounter: Payer: Self-pay | Admitting: Nurse Practitioner

## 2017-10-23 ENCOUNTER — Ambulatory Visit: Payer: Medicare HMO | Attending: Nurse Practitioner | Admitting: Nurse Practitioner

## 2017-10-23 ENCOUNTER — Other Ambulatory Visit: Payer: Self-pay

## 2017-10-23 ENCOUNTER — Telehealth: Payer: Self-pay | Admitting: Nurse Practitioner

## 2017-10-23 VITALS — BP 135/75 | HR 74 | Temp 98.3°F | Resp 18 | Ht 63.0 in | Wt 245.0 lb

## 2017-10-23 DIAGNOSIS — M47816 Spondylosis without myelopathy or radiculopathy, lumbar region: Secondary | ICD-10-CM | POA: Insufficient documentation

## 2017-10-23 DIAGNOSIS — G8929 Other chronic pain: Secondary | ICD-10-CM

## 2017-10-23 DIAGNOSIS — Z79891 Long term (current) use of opiate analgesic: Secondary | ICD-10-CM | POA: Insufficient documentation

## 2017-10-23 DIAGNOSIS — M533 Sacrococcygeal disorders, not elsewhere classified: Secondary | ICD-10-CM | POA: Diagnosis not present

## 2017-10-23 DIAGNOSIS — F418 Other specified anxiety disorders: Secondary | ICD-10-CM | POA: Diagnosis not present

## 2017-10-23 DIAGNOSIS — I10 Essential (primary) hypertension: Secondary | ICD-10-CM | POA: Diagnosis not present

## 2017-10-23 DIAGNOSIS — M25562 Pain in left knee: Secondary | ICD-10-CM | POA: Insufficient documentation

## 2017-10-23 DIAGNOSIS — E785 Hyperlipidemia, unspecified: Secondary | ICD-10-CM | POA: Diagnosis not present

## 2017-10-23 DIAGNOSIS — Z6841 Body Mass Index (BMI) 40.0 and over, adult: Secondary | ICD-10-CM | POA: Diagnosis not present

## 2017-10-23 DIAGNOSIS — M792 Neuralgia and neuritis, unspecified: Secondary | ICD-10-CM

## 2017-10-23 DIAGNOSIS — M797 Fibromyalgia: Secondary | ICD-10-CM | POA: Insufficient documentation

## 2017-10-23 DIAGNOSIS — M48061 Spinal stenosis, lumbar region without neurogenic claudication: Secondary | ICD-10-CM | POA: Diagnosis not present

## 2017-10-23 DIAGNOSIS — M25561 Pain in right knee: Secondary | ICD-10-CM | POA: Diagnosis not present

## 2017-10-23 DIAGNOSIS — Z5181 Encounter for therapeutic drug level monitoring: Secondary | ICD-10-CM | POA: Diagnosis not present

## 2017-10-23 DIAGNOSIS — Z7982 Long term (current) use of aspirin: Secondary | ICD-10-CM | POA: Insufficient documentation

## 2017-10-23 DIAGNOSIS — E559 Vitamin D deficiency, unspecified: Secondary | ICD-10-CM | POA: Insufficient documentation

## 2017-10-23 DIAGNOSIS — G894 Chronic pain syndrome: Secondary | ICD-10-CM | POA: Diagnosis not present

## 2017-10-23 DIAGNOSIS — Z79899 Other long term (current) drug therapy: Secondary | ICD-10-CM | POA: Insufficient documentation

## 2017-10-23 MED ORDER — HYDROCODONE-ACETAMINOPHEN 5-325 MG PO TABS: 1.0000 | ORAL_TABLET | Freq: Three times a day (TID) | ORAL | 0 refills | Status: DC

## 2017-10-23 MED ORDER — GABAPENTIN 300 MG PO CAPS: 300.0000 mg | ORAL_CAPSULE | Freq: Three times a day (TID) | ORAL | 2 refills | Status: DC

## 2017-10-23 MED ORDER — CYCLOBENZAPRINE HCL 10 MG PO TABS: 10.0000 mg | ORAL_TABLET | Freq: Every day | ORAL | 2 refills | Status: DC

## 2017-10-23 MED ORDER — HYDROCODONE-ACETAMINOPHEN 5-325 MG PO TABS: 1.0000 | ORAL_TABLET | Freq: Four times a day (QID) | ORAL | 0 refills | Status: DC | PRN

## 2017-10-23 NOTE — Telephone Encounter (Signed)
PA request for Hydrocodone submitted. 

## 2017-10-23 NOTE — Telephone Encounter (Signed)
Patient needs prior auth for scripts today. Sent paper from pharmacy to Nurses for completion

## 2017-10-23 NOTE — Progress Notes (Signed)
Patient's Name: Wanda [name]  MRN: [id number]  Referring Provider: Colon Branch, MD  DOB: [birth year]  PCP: Colon Branch, MD  DOS: 10/23/2017  Note by: Vevelyn Francois NP  Service setting: Ambulatory outpatient  Specialty: Interventional Pain Management  Location: [facility] (AMB) Pain Management Facility    Patient type: Established    Primary Reason(s) for Visit: Encounter for prescription drug management. (Level of risk: moderate)  CC: Back Pain (lower)  HPI  Wanda [name] is a 72 y.o. year old, female patient, who comes today for a medication management evaluation. 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; and PCP NOTES >>>>>> on their problem list. Her primarily concern today is the Back Pain (lower)  Pain Assessment: Location: Lower Back Radiating: denies Onset: More than a month ago Duration: Chronic pain Quality:  Sharp Severity: 3 /10 (self-reported pain score)  Note: Reported level is compatible with observation.                          Timing: Intermittent Modifying factors: rest, medications  Ms. Creighton was last scheduled for an appointment on 07/10/2017 for medication management. During today's appointment we reviewed Wanda [name]'s chronic pain status, as well as her outpatient medication regimen. She admits that she continues to have left knee pain and she has not had her injection. She is not sure why she has not be contacted. She admits that she is using her medication as directed . She admits that she did go talk with a therapist secondary to her husband has dementia.   The patient  reports that she does not use drugs. Her body mass index is 43.4 kg/m.  Further details on both, my assessment(s), as well as the proposed treatment plan, please see below.  Controlled Substance Pharmacotherapy Assessment REMS (Risk Evaluation and Mitigation Strategy)  Analgesic:Hydrocodone/APAP 5/325 one every 6 hours (20 mg/day of hydrocodone) MME/day:20 mg/day   WLandis Martins RN  10/23/2017 11:20 AM  Sign at close encounter Nursing Pain Medication Assessment:  Safety precautions to be maintained throughout the outpatient stay will include: orient to surroundings, keep bed in low position, maintain call bell within reach at all times, provide assistance with transfer out of bed and ambulation.  Medication Inspection Compliance: Pill count conducted under aseptic conditions, in front of the patient. Neither the pills nor the bottle was removed from the patient's sight at any time. Once count was completed pills were immediately returned to the patient in their original bottle.  Medication: Hydrocodone/APAP Pill/Patch Count: 38 of 120 pills remain Pill/Patch Appearance: Markings consistent with prescribed medication Bottle Appearance: Standard pharmacy container.  Clearly labeled. Filled Date:03/14/  2018 Last Medication intake:  Today   Pharmacokinetics: Liberation and absorption (onset of action): WNL Distribution (time to peak effect): WNL Metabolism and excretion (duration of action): WNL         Pharmacodynamics: Desired effects: Analgesia: Wanda [name] reports >50% benefit. Functional ability: Patient reports that medication allows her to accomplish basic ADLs Clinically meaningful improvement in function (CMIF): Sustained CMIF goals met Perceived effectiveness: Described as relatively effective, allowing for increase in activities of daily living (ADL) Undesirable effects: Side-effects or Adverse reactions: None reported Monitoring: [place] PMP: Online review of the past 70-monthperiod conducted. Compliant with practice rules and regulations Last UDS on record: Summary  Date Value Ref Range Status  07/10/2017 FINAL  Final    Comment:    ==================================================================== TOXASSURE SELECT 13 (MW) ==================================================================== Test                             Result       Flag       Units Drug Present and Declared for Prescription Verification   Hydrocodone                    195          EXPECTED   ng/mg creat   Hydromorphone                  103          EXPECTED   ng/mg creat   Norhydrocodone                 430          EXPECTED   ng/mg creat    Sources of hydrocodone include scheduled prescription    medications. Hydromorphone and norhydrocodone are expected    metabolites of hydrocodone. Hydromorphone is also available as a    scheduled prescription medication. ==================================================================== Test                      Result    Flag   Units      Ref Range   Creatinine              129              mg/dL      >=20 ==================================================================== Declared Medications:  The flagging and interpretation on this report are based  on the  following declared medications.  Unexpected results may arise from  inaccuracies in the declared medications.  **Note: The testing scope of this panel includes these medications:  Hydrocodone (Hydrocodone-Acetaminophen)  **Note: The testing scope of this panel does not include following  reported medications:  Acetaminophen (Hydrocodone-Acetaminophen)  Aspirin (Aspirin 81)  Atorvastatin  Cholecalciferol  Cyclobenzaprine  Gabapentin  Hydrochlorothiazide  Lisinopril  Magnesium  Sertraline ==================================================================== For clinical consultation, please call [phone] ====================================================================    UDS interpretation: Compliant          Medication Assessment Form: Reviewed. Patient indicates being compliant with therapy Treatment compliance: Compliant Risk Assessment Profile: Aberrant behavior: See prior evaluations. None observed or detected today Comorbid factors increasing risk of overdose: See prior notes. No additional risks detected today Risk of substance use disorder (SUD): Low  ORT Scoring interpretation table:  Score <3 = Low Risk for SUD  Score between 4-7 = Moderate Risk for SUD  Score >8 = High Risk for Opioid Abuse   Risk Mitigation  Strategies:  Patient Counseling: Covered Patient-Prescriber Agreement (PPA): Present and active  Notification to other healthcare providers: Done  Pharmacologic Plan: No change in therapy, at this time.             Laboratory Chemistry  Inflammation Markers (CRP: Acute Phase) (ESR: Chronic Phase) Lab Results  Component Value Date   CRP <0.5 08/18/2015   ESRSEDRATE 28 08/18/2015                         Rheumatology Markers No results found for: RF, ANA, Therisa Doyne, [facility]                      Renal Function Markers Lab Results  Component Value Date   BUN 10 08/30/2016   CREATININE 0.65 08/30/2016   GFRAA  >60 08/18/2015   GFRNONAA >60 08/18/2015                              Hepatic Function Markers Lab Results  Component Value Date   AST 19 08/30/2016   ALT 19 08/30/2016   ALBUMIN 4.2 08/30/2016   ALKPHOS 74 08/30/2016   HCVAB NEGATIVE 08/30/2016                        Electrolytes Lab Results  Component Value Date   NA 136 08/30/2016   K 4.2 08/30/2016   CL 102 08/30/2016   CALCIUM 9.8 08/30/2016   MG 2.0 08/18/2015                        Neuropathy Markers No results found for: Archie Balboa, HGBA1C, HIV                      Bone Pathology Markers Lab Results  Component Value Date   VD25OH 37 05/01/2011   VD125OH2TOT 58 02/01/2015   [id number] 58 02/01/2015   [id number] <8 02/01/2015                         Coagulation Parameters Lab Results  Component Value Date   PLT 176.0 08/30/2016                        Cardiovascular Markers Lab Results  Component Value Date   HGB 13.8 08/30/2016   HCT 40.9 08/30/2016                         CA Markers No results found for: CEA, CA125, LABCA2                      Note: Lab results reviewed.  Recent Diagnostic Imaging Results  MM DIAG BREAST TOMO BILATERAL CLINICAL DATA:  Annual examination of both breasts and follow-up of probably benign calcifications in the outer left breast.  EXAM: 2D DIGITAL DIAGNOSTIC BILATERAL MAMMOGRAM WITH CAD AND ADJUNCT TOMO  COMPARISON:  Previous exam(s).  ACR Breast Density Category b: There are scattered areas of fibroglandular density.  FINDINGS: The coarse dystrophic benign-appearing calcifications in the upper-outer left breast are unchanged. No suspicious microcalcifications are identified in either breast to suggest malignancy. No mass or architectural distortion is identified in either breast to suggest malignancy.  Mammographic images were processed with CAD.  IMPRESSION: Stable benign  appearing dystrophic calcifications in the upper-outer left breast. No  evidence of malignancy in either breast.  RECOMMENDATION: Screening mammogram in one year.(Code:[id number])  I have discussed the findings and recommendations with the patient. Results were also provided in writing at the conclusion of the visit. If applicable, a reminder letter will be sent to the patient regarding the next appointment.  BI-RADS CATEGORY  2: Benign.  Electronically Signed   By: Curlene Dolphin M.D.   On: 06/01/2017 15:32  Complexity Note: Imaging results reviewed. Results shared with Ms. Schaumburg, using Layman's terms.                         Meds   Current Outpatient Medications:  .  aspirin EC 81 MG tablet, Take 81 mg by mouth daily., Disp: , Rfl:  .  atorvastatin (LIPITOR) 10 MG tablet, Take 1 tablet (10 mg total) by mouth at bedtime., Disp: 90 tablet, Rfl: 3 .  Cholecalciferol (VITAMIN D) 2000 UNITS tablet, Take 2,000 Units by mouth daily., Disp: , Rfl:  .  cyclobenzaprine (FLEXERIL) 10 MG tablet, Take 1 tablet (10 mg total) by mouth at bedtime., Disp: 30 tablet, Rfl: 2 .  gabapentin (NEURONTIN) 300 MG capsule, Take 1 capsule (300 mg total) by mouth 3 (three) times daily., Disp: 90 capsule, Rfl: 2 .  hydrochlorothiazide (HYDRODIURIL) 25 MG tablet, Take 1 tablet (25 mg total) by mouth daily., Disp: 90 tablet, Rfl: 3 .  [START ON 01/01/2018] HYDROcodone-acetaminophen (NORCO/VICODIN) 5-325 MG tablet, Take 1 tablet by mouth every 6 (six) hours as needed for severe pain., Disp: 120 tablet, Rfl: 0 .  lisinopril (PRINIVIL,ZESTRIL) 10 MG tablet, Take 1 tablet (10 mg total) by mouth daily., Disp: 30 tablet, Rfl: 0 .  sertraline (ZOLOFT) 50 MG tablet, Take 3 tablets (150 mg total) by mouth daily., Disp: 90 tablet, Rfl: 2 .  [START ON 11/02/2017] HYDROcodone-acetaminophen (NORCO/VICODIN) 5-325 MG tablet, Take 1 tablet by mouth every 6 (six) hours as needed for severe pain., Disp: 120 tablet, Rfl: 0 .  [START ON 12/02/2017] HYDROcodone-acetaminophen (NORCO/VICODIN) 5-325 MG tablet,  Take 1 tablet by mouth every 6 (six) hours as needed for moderate pain., Disp: 120 tablet, Rfl: 0  ROS  Constitutional: Denies any fever or chills Gastrointestinal: No reported hemesis, hematochezia, vomiting, or acute GI distress Musculoskeletal: Denies any acute onset joint swelling, redness, loss of ROM, or weakness Neurological: No reported episodes of acute onset apraxia, aphasia, dysarthria, agnosia, amnesia, paralysis, loss of coordination, or loss of consciousness  Allergies  Ms. Frison is allergic to simvastatin.  [place]  Drug: Ms. Skufca  reports that she does not use drugs. Alcohol:  reports that she does not drink alcohol. Tobacco:  reports that she has never smoked. She has never used smokeless tobacco. Medical:  has a past medical history of Allergy, Chronic back pain, Depression, DJD (degenerative joint disease), Hyperlipidemia, Hypertension, Menopause, and TMJ PAIN (05/17/2010). Surgical: Ms. Canlas  has a past surgical history that includes G3 P3 . Family: family history includes Breast cancer in her sister; Cancer in her brother and sister; Colon cancer in her brother; Diabetes in her unknown relative.  Constitutional Exam  General appearance: Well nourished, well developed, and well hydrated. In no apparent acute distress Vitals:   10/23/17 1113  BP: 135/75  Pulse: 74  Resp: 18  Temp: 98.3 F (36.8 C)  TempSrc: Oral  SpO2: 100%  Weight: 245 lb (111.1 kg)  Height: '5\' 3"'$  (1.6 m)  Psych/Mental  status: Alert, oriented x 3 (person, place, & time)       Eyes: PERLA Respiratory: No evidence of acute respiratory distress   Lumbar Spine Area Exam  Skin & Axial Inspection: No masses, redness, or swelling Alignment: Symmetrical Functional ROM: Unrestricted ROM      Stability: No instability detected Muscle Tone/Strength: Functionally intact. No obvious neuro-muscular anomalies detected. Sensory (Neurological): Unimpaired Palpation: No palpable anomalies        Provocative Tests: Lumbar Hyperextension and rotation test: evaluation deferred today       Lumbar Lateral bending test: evaluation deferred today       Patrick's Maneuver: evaluation deferred today                    Gait & Posture Assessment  Ambulation: Patient ambulates using a cane Gait: Relatively normal for age and body habitus Posture: WNL   Lower Extremity Exam    Side: Right lower extremity  Side: Left lower extremity  Skin & Extremity Inspection: Skin color, temperature, and hair growth are WNL. No peripheral edema or cyanosis. No masses, redness, swelling, asymmetry, or associated skin lesions. No contractures.  Skin & Extremity Inspection: Edema  Functional ROM: Unrestricted ROM          Functional ROM: Adequate ROM          Muscle Tone/Strength: Functionally intact. No obvious neuro-muscular anomalies detected.  Muscle Tone/Strength: Functionally intact. No obvious neuro-muscular anomalies detected.  Sensory (Neurological): Unimpaired  Sensory (Neurological): Unimpaired  Palpation: No palpable anomalies  Palpation: Tender   Assessment  Primary Diagnosis & Pertinent Problem List: The primary encounter diagnosis was Chronic pain of left knee. Diagnoses of Lumbar spondylosis, Fibromyalgia, Neurogenic pain, and Chronic pain syndrome were also pertinent to this visit.  Status Diagnosis  Persistent Controlled Controlled 1. Chronic pain of left knee   2. Lumbar spondylosis   3. Fibromyalgia   4. Neurogenic pain   5. Chronic pain syndrome     Problems updated and reviewed during this visit: No problems updated. Plan of Care  Pharmacotherapy (Medications Ordered): Meds ordered this encounter  Medications  . HYDROcodone-acetaminophen (NORCO/VICODIN) 5-325 MG tablet    Sig: Take 1 tablet by mouth every 6 (six) hours as needed for severe pain.    Dispense:  120 tablet    Refill:  0    Do not place this medication, or any other prescription from our practice, on "Automatic  Refill". Patient may have prescription filled one day early if pharmacy is closed on scheduled refill date. Do not fill until: 01/01/2018 To last until: 01/31/2018    Order Specific Question:   Supervising Provider    Answer:   Milinda Pointer [phone]  . gabapentin (NEURONTIN) 300 MG capsule    Sig: Take 1 capsule (300 mg total) by mouth 3 (three) times daily.    Dispense:  90 capsule    Refill:  2    Do not place this medication, or any other prescription from our practice, on "Automatic Refill". Patient may have prescription filled one day early if pharmacy is closed on scheduled refill date.    Order Specific Question:   Supervising Provider    Answer:   Milinda Pointer [phone]  . cyclobenzaprine (FLEXERIL) 10 MG tablet    Sig: Take 1 tablet (10 mg total) by mouth at bedtime.    Dispense:  30 tablet    Refill:  2    Order Specific Question:   Supervising Provider  AnswerMilinda Pointer [[id number]]  . HYDROcodone-acetaminophen (NORCO/VICODIN) 5-325 MG tablet    Sig: Take 1 tablet by mouth every 6 (six) hours as needed for severe pain.    Dispense:  120 tablet    Refill:  0    Do not place this medication, or any other prescription from our practice, on "Automatic Refill". Patient may have prescription filled one day early if pharmacy is closed on scheduled refill date. Do not fill until: 11/02/2017 To last until:12/02/2017    Order Specific Question:   Supervising Provider    Answer:   Milinda Pointer [phone]  . HYDROcodone-acetaminophen (NORCO/VICODIN) 5-325 MG tablet    Sig: Take 1 tablet by mouth every 6 (six) hours as needed for moderate pain.    Dispense:  120 tablet    Refill:  0    Do not place this medication, or any other prescription from our practice, on "Automatic Refill". Patient may have prescription filled one day early if pharmacy is closed on scheduled refill date. Do not fill until: 12/02/2017 To last until:01/01/2018    Order Specific Question:    Supervising Provider    Answer:   Milinda Pointer [phone]   New Prescriptions   HYDROCODONE-ACETAMINOPHEN (NORCO/VICODIN) 5-325 MG TABLET    Take 1 tablet by mouth every 6 (six) hours as needed for moderate pain.   Medications administered today: Leilene L. Norbeck had no medications administered during this visit. Lab-work, procedure(s), and/or referral(s): Orders Placed This Encounter  Procedures  . KNEE INJECTION   Imaging and/or referral(s): None  Interventional therapies: Planned, scheduled, and/or pending:  left intra-articular knee injection.   Considering:  Diagnostic/palliative left intra-articular knee injection.  Diagnostic bilateral lumbar facet block Left L4-5 lumbar epiduralsteroid injection   Palliative PRN treatment(s):  Palliative bilateral lumbar facet block Left L4-5 lumbar epiduralsteroid injection Palliative left intra-articular knee injection      Provider-requested follow-up: Return in about 3 months (around 01/22/2018) for MedMgmt with Me Donella Stade Edison Pace).  Future Appointments  Date Time Provider [place]  10/24/2017 10:00 AM Bauert, Nicolasa Ducking, LCSW [facility] None  10/30/2017  9:45 AM Milinda Pointer, MD [facility] None  01/22/2018 10:45 AM Vevelyn Francois, NP [facility] None   Primary Care Physician: Colon Branch, MD Location: [facility] Out[facility] Management [facility] Note by: Vevelyn Francois NP Date: 10/23/2017; Time: 2:18 PM  Pain Score Disclaimer: We use the NRS-11 scale. This is a self-reported, subjective measurement of pain severity with only modest accuracy. It is used primarily to identify changes within a particular patient. It must be understood that outpatient pain scales are significantly less accurate that those used for research, where they can be applied under ideal controlled circumstances with minimal exposure to variables. In reality, the score is likely to be a combination of pain intensity and pain affect, where pain affect  describes the degree of emotional arousal or changes in action readiness caused by the sensory experience of pain. Factors such as social and work situation, setting, emotional state, anxiety levels, expectation, and prior pain experience may influence pain perception and show large inter-individual differences that may also be affected by time variables.  Patient instructions provided during this appointment: Patient Instructions   ____________________________________________________________________________________________  Medication Rules  Applies to: All patients receiving prescriptions (written or electronic).  Pharmacy of record: Pharmacy where electronic prescriptions will be sent. If written prescriptions are taken to a different pharmacy, please inform the nursing staff. The pharmacy listed in the electronic medical record  should be the one where you would like electronic prescriptions to be sent.  Prescription refills: Only during scheduled appointments. Applies to both, written and electronic prescriptions.  NOTE: The following applies primarily to controlled substances (Opioid* Pain Medications).   Patient's responsibilities: 1. Pain Pills: Bring all pain pills to every appointment (except for procedure appointments). 2. Pill Bottles: Bring pills in original pharmacy bottle. Always bring newest bottle. Bring bottle, even if empty. 3. Medication refills: You are responsible for knowing and keeping track of what medications you need refilled. The day before your appointment, write a list of all prescriptions that need to be refilled. Bring that list to your appointment and give it to the admitting nurse. Prescriptions will be written only during appointments. If you forget a medication, it will not be "Called in", "Faxed", or "electronically sent". You will need to get another appointment to get these prescribed. 4. Prescription Accuracy: You are responsible for carefully inspecting  your prescriptions before leaving our office. Have the discharge nurse carefully go over each prescription with you, before taking them home. Make sure that your name is accurately spelled, that your address is correct. Check the name and dose of your medication to make sure it is accurate. Check the number of pills, and the written instructions to make sure they are clear and accurate. Make sure that you are given enough medication to last until your next medication refill appointment. 5. Taking Medication: Take medication as prescribed. Never take more pills than instructed. Never take medication more frequently than prescribed. Taking less pills or less frequently is permitted and encouraged, when it comes to controlled substances (written prescriptions).  6. Inform other Doctors: Always inform, all of your healthcare providers, of all the medications you take. 7. Pain Medication from other Providers: You are not allowed to accept any additional pain medication from any other Doctor or Healthcare provider. There are two exceptions to this rule. (see below) In the event that you require additional pain medication, you are responsible for notifying us, as stated below. 8. Medication Agreement: You are responsible for carefully reading and following our Medication Agreement. This must be signed before receiving any prescriptions from our practice. Safely store a copy of your signed Agreement. Violations to the Agreement will result in no further prescriptions. (Additional copies of our Medication Agreement are available upon request.) 9. Laws, Rules, & Regulations: All patients are expected to follow all Federal and [employer], Rules, [employer]. Ignorance of the Laws does not constitute a valid excuse. The use of any illegal substances is prohibited. 10. Adopted CDC guidelines & recommendations: Target dosing levels will be at or below 60 MME/day. Use of benzodiazepines** is not  recommended.  Exceptions: There are only two exceptions to the rule of not receiving pain medications from other Healthcare Providers. 1. Exception #1 (Emergencies): In the event of an emergency (i.e.: accident requiring emergency care), you are allowed to receive additional pain medication. However, you are responsible for: As soon as you are able, call our office [phone], at any time of the day or night, and leave a message stating your name, the date and nature of the emergency, and the name and dose of the medication prescribed. In the event that your call is answered by a member of our staff, make sure to document and save the date, time, and the name of the person that took your information.  2. Exception #2 (Planned Surgery): In the event that you are scheduled  by another doctor or dentist to have any type of surgery or procedure, you are allowed (for a period no longer than 30 days), to receive additional pain medication, for the acute post-op pain. However, in this case, you are responsible for picking up a copy of our "Post-op Pain Management for Surgeons" handout, and giving it to your surgeon or dentist. This document is available at our office, and does not require an appointment to obtain it. Simply go to our office during business hours (Monday-Thursday from 8:00 AM to 4:00 PM) (Friday 8:00 AM to 12:00 Noon) or if you have a scheduled appointment with [place], prior to your surgery, and ask for it by name. In addition, you will need to provide [place] with your name, name of your surgeon, type of surgery, and date of procedure or surgery.  *Opioid medications include: morphine, codeine, oxycodone, oxymorphone, hydrocodone, hydromorphone, meperidine, tramadol, tapentadol, buprenorphine, fentanyl, methadone. **Benzodiazepine medications include: diazepam (Valium), alprazolam (Xanax), clonazepam (Klonopine), lorazepam (Ativan), clorazepate (Tranxene), chlordiazepoxide (Librium), estazolam (Prosom),  oxazepam (Serax), temazepam (Restoril), triazolam (Halcion) (Last updated: 09/20/2017) ____________________________________________________________________________________________   BMI Assessment: Estimated body mass index is 43.4 kg/m as calculated from the following:   Height as of this encounter: '5\' 3"'$  (1.6 m).   Weight as of this encounter: 245 lb (111.1 kg).  BMI interpretation table: BMI level Category Range association with higher incidence of chronic pain  <18 kg/m2 Underweight   18.5-24.9 kg/m2 Ideal body weight   25-29.9 kg/m2 Overweight Increased incidence by 20%  30-34.9 kg/m2 Obese (Class I) Increased incidence by 68%  35-39.9 kg/m2 Severe obesity (Class II) Increased incidence by 136%  >40 kg/m2 Extreme obesity (Class III) Increased incidence by 254%   BMI Readings from Last 4 Encounters:  10/23/17 43.40 kg/m  07/27/17 43.77 kg/m  07/10/17 41.54 kg/m  04/25/17 41.54 kg/m   Wt Readings from Last 4 Encounters:  10/23/17 245 lb (111.1 kg)  07/27/17 255 lb (115.7 kg)  07/10/17 242 lb (109.8 kg)  04/25/17 242 lb (109.8 kg)   Knee Injection A knee injection is a procedure to get medicine into your knee joint. Your health care provider puts a needle into the joint and injects medicine with an attached syringe. The injected medicine may relieve the pain, swelling, and stiffness of arthritis. The injected medicine may also help to lubricate and cushion your knee joint. You may need more than one injection. Tell a health care provider about:  Any allergies you have.  All medicines you are taking, including vitamins, herbs, eye drops, creams, and over-the-counter medicines.  Any problems you or family members have had with anesthetic medicines.  Any blood disorders you have.  Any surgeries you have had.  Any medical conditions you have. What are the risks? Generally, this is a safe procedure. However, problems may occur,  including:  Infection.  Bleeding.  Worsening symptoms.  Damage to the area around your knee.  Allergic reaction to any of the medicines.  Skin reactions from repeated injections.  What happens before the procedure?  Ask your health care provider about changing or stopping your regular medicines. This is especially important if you are taking diabetes medicines or blood thinners.  Plan to have someone take you home after the procedure. What happens during the procedure?  You will sit or lie down in a position for your knee to be treated.  The skin over your kneecap will be cleaned with a germ-killing solution (antiseptic).  You will be given a medicine that  numbs the area (local anesthetic). You may feel some stinging.  After your knee becomes numb, you will have a second injection. This is the medicine. This needle is carefully placed between your kneecap and your knee. The medicine is injected into the joint space.  At the end of the procedure, the needle will be removed.  A bandage (dressing) may be placed over the injection site. The procedure may vary among health care providers and hospitals. What happens after the procedure?  You may have to move your knee through its full range of motion. This helps to get all of the medicine into your joint space.  Your blood pressure, heart rate, breathing rate, and blood oxygen level will be monitored often until the medicines you were given have worn off.  You will be watched to make sure that you do not have a reaction to the injected medicine. This information is not intended to replace advice given to you by your health care provider. Make sure you discuss any questions you have with your health care provider. Document Released: 10/01/2006 Document Revised: 12/10/2015 Document Reviewed: 05/20/2014 Elsevier Interactive Patient Education  2018 [employer].

## 2017-10-23 NOTE — Patient Instructions (Addendum)
____________________________________________________________________________________________  Medication Rules  Applies to: All patients receiving prescriptions (written or electronic).  Pharmacy of record: Pharmacy where electronic prescriptions will be sent. If written prescriptions are taken to a different pharmacy, please inform the nursing staff. The pharmacy listed in the electronic medical record should be the one where you would like electronic prescriptions to be sent.  Prescription refills: Only during scheduled appointments. Applies to both, written and electronic prescriptions.  NOTE: The following applies primarily to controlled substances (Opioid* Pain Medications).   Patient's responsibilities: 1. Pain Pills: Bring all pain pills to every appointment (except for procedure appointments). 2. Pill Bottles: Bring pills in original pharmacy bottle. Always bring newest bottle. Bring bottle, even if empty. 3. Medication refills: You are responsible for knowing and keeping track of what medications you need refilled. The day before your appointment, write a list of all prescriptions that need to be refilled. Bring that list to your appointment and give it to the admitting nurse. Prescriptions will be written only during appointments. If you forget a medication, it will not be "Called in", "Faxed", or "electronically sent". You will need to get another appointment to get these prescribed. 4. Prescription Accuracy: You are responsible for carefully inspecting your prescriptions before leaving our office. Have the discharge nurse carefully go over each prescription with you, before taking them home. Make sure that your name is accurately spelled, that your address is correct. Check the name and dose of your medication to make sure it is accurate. Check the number of pills, and the written instructions to make sure they are clear and accurate. Make sure that you are given enough medication to last  until your next medication refill appointment. 5. Taking Medication: Take medication as prescribed. Never take more pills than instructed. Never take medication more frequently than prescribed. Taking less pills or less frequently is permitted and encouraged, when it comes to controlled substances (written prescriptions).  6. Inform other Doctors: Always inform, all of your healthcare providers, of all the medications you take. 7. Pain Medication from other Providers: You are not allowed to accept any additional pain medication from any other Doctor or Healthcare provider. There are two exceptions to this rule. (see below) In the event that you require additional pain medication, you are responsible for notifying us, as stated below. 8. Medication Agreement: You are responsible for carefully reading and following our Medication Agreement. This must be signed before receiving any prescriptions from our practice. Safely store a copy of your signed Agreement. Violations to the Agreement will result in no further prescriptions. (Additional copies of our Medication Agreement are available upon request.) 9. Laws, Rules, & Regulations: All patients are expected to follow all Federal and State Laws, Statutes, Rules, & Regulations. Ignorance of the Laws does not constitute a valid excuse. The use of any illegal substances is prohibited. 10. Adopted CDC guidelines & recommendations: Target dosing levels will be at or below 60 MME/day. Use of benzodiazepines** is not recommended.  Exceptions: There are only two exceptions to the rule of not receiving pain medications from other Healthcare Providers. 1. Exception #1 (Emergencies): In the event of an emergency (i.e.: accident requiring emergency care), you are allowed to receive additional pain medication. However, you are responsible for: As soon as you are able, call our office [phone], at any time of the day or night, and leave a message stating your name, the  date and nature of the emergency, and the name and dose of the medication   prescribed. In the event that your call is answered by a member of our staff, make sure to document and save the date, time, and the name of the person that took your information.  2. Exception #2 (Planned Surgery): In the event that you are scheduled by another doctor or dentist to have any type of surgery or procedure, you are allowed (for a period no longer than 30 days), to receive additional pain medication, for the acute post-op pain. However, in this case, you are responsible for picking up a copy of our "Post-op Pain Management for Surgeons" handout, and giving it to your surgeon or dentist. This document is available at our office, and does not require an appointment to obtain it. Simply go to our office during business hours (Monday-Thursday from 8:00 AM to 4:00 PM) (Friday 8:00 AM to 12:00 Noon) or if you have a scheduled appointment with [place], prior to your surgery, and ask for it by name. In addition, you will need to provide [place] with your name, name of your surgeon, type of surgery, and date of procedure or surgery.  *Opioid medications include: morphine, codeine, oxycodone, oxymorphone, hydrocodone, hydromorphone, meperidine, tramadol, tapentadol, buprenorphine, fentanyl, methadone. **Benzodiazepine medications include: diazepam (Valium), alprazolam (Xanax), clonazepam (Klonopine), lorazepam (Ativan), clorazepate (Tranxene), chlordiazepoxide (Librium), estazolam (Prosom), oxazepam (Serax), temazepam (Restoril), triazolam (Halcion) (Last updated: 09/20/2017) ____________________________________________________________________________________________   BMI Assessment: Estimated body mass index is 43.4 kg/m as calculated from the following:   Height as of this encounter: 5\' 3"  (1.6 m).   Weight as of this encounter: 245 lb (111.1 kg).  BMI interpretation table: BMI level Category Range association with higher incidence  of chronic pain  <18 kg/m2 Underweight   18.5-24.9 kg/m2 Ideal body weight   25-29.9 kg/m2 Overweight Increased incidence by 20%  30-34.9 kg/m2 Obese (Class I) Increased incidence by 68%  35-39.9 kg/m2 Severe obesity (Class II) Increased incidence by 136%  >40 kg/m2 Extreme obesity (Class III) Increased incidence by 254%   BMI Readings from Last 4 Encounters:  10/23/17 43.40 kg/m  07/27/17 43.77 kg/m  07/10/17 41.54 kg/m  04/25/17 41.54 kg/m   Wt Readings from Last 4 Encounters:  10/23/17 245 lb (111.1 kg)  07/27/17 255 lb (115.7 kg)  07/10/17 242 lb (109.8 kg)  04/25/17 242 lb (109.8 kg)   Knee Injection A knee injection is a procedure to get medicine into your knee joint. Your health care provider puts a needle into the joint and injects medicine with an attached syringe. The injected medicine may relieve the pain, swelling, and stiffness of arthritis. The injected medicine may also help to lubricate and cushion your knee joint. You may need more than one injection. Tell a health care provider about:  Any allergies you have.  All medicines you are taking, including vitamins, herbs, eye drops, creams, and over-the-counter medicines.  Any problems you or family members have had with anesthetic medicines.  Any blood disorders you have.  Any surgeries you have had.  Any medical conditions you have. What are the risks? Generally, this is a safe procedure. However, problems may occur, including:  Infection.  Bleeding.  Worsening symptoms.  Damage to the area around your knee.  Allergic reaction to any of the medicines.  Skin reactions from repeated injections.  What happens before the procedure?  Ask your health care provider about changing or stopping your regular medicines. This is especially important if you are taking diabetes medicines or blood thinners.  Plan to have someone take you home  after the procedure. What happens during the procedure?  You will  sit or lie down in a position for your knee to be treated.  The skin over your kneecap will be cleaned with a germ-killing solution (antiseptic).  You will be given a medicine that numbs the area (local anesthetic). You may feel some stinging.  After your knee becomes numb, you will have a second injection. This is the medicine. This needle is carefully placed between your kneecap and your knee. The medicine is injected into the joint space.  At the end of the procedure, the needle will be removed.  A bandage (dressing) may be placed over the injection site. The procedure may vary among health care providers and hospitals. What happens after the procedure?  You may have to move your knee through its full range of motion. This helps to get all of the medicine into your joint space.  Your blood pressure, heart rate, breathing rate, and blood oxygen level will be monitored often until the medicines you were given have worn off.  You will be watched to make sure that you do not have a reaction to the injected medicine. This information is not intended to replace advice given to you by your health care provider. Make sure you discuss any questions you have with your health care provider. Document Released: 10/01/2006 Document Revised: 12/10/2015 Document Reviewed: 05/20/2014 Elsevier Interactive Patient Education  2018 [employer].

## 2017-10-23 NOTE — Progress Notes (Signed)
Nursing Pain Medication Assessment:  Safety precautions to be maintained throughout the outpatient stay will include: orient to surroundings, keep bed in low position, maintain call bell within reach at all times, provide assistance with transfer out of bed and ambulation.  Medication Inspection Compliance: Pill count conducted under aseptic conditions, in front of the patient. Neither the pills nor the bottle was removed from the patient's sight at any time. Once count was completed pills were immediately returned to the patient in their original bottle.  Medication: Hydrocodone/APAP Pill/Patch Count: 38 of 120 pills remain Pill/Patch Appearance: Markings consistent with prescribed medication Bottle Appearance: Standard pharmacy container. Clearly labeled. Filled Date:03/14/ 2018 Last Medication intake:  Today

## 2017-10-24 ENCOUNTER — Ambulatory Visit (INDEPENDENT_AMBULATORY_CARE_PROVIDER_SITE_OTHER): Payer: Medicare HMO | Admitting: Psychology

## 2017-10-24 DIAGNOSIS — F331 Major depressive disorder, recurrent, moderate: Secondary | ICD-10-CM

## 2017-10-30 ENCOUNTER — Other Ambulatory Visit: Payer: Self-pay

## 2017-10-30 ENCOUNTER — Encounter: Payer: Self-pay | Admitting: Pain Medicine

## 2017-10-30 ENCOUNTER — Ambulatory Visit: Payer: Medicare HMO | Attending: Pain Medicine | Admitting: Pain Medicine

## 2017-10-30 ENCOUNTER — Ambulatory Visit: Admission: RE | Admit: 2017-10-30 | Payer: Medicare HMO | Source: Ambulatory Visit

## 2017-10-30 VITALS — BP 131/84 | HR 85 | Temp 98.2°F | Resp 18 | Ht 63.0 in | Wt 245.0 lb

## 2017-10-30 DIAGNOSIS — Z888 Allergy status to other drugs, medicaments and biological substances status: Secondary | ICD-10-CM | POA: Diagnosis not present

## 2017-10-30 DIAGNOSIS — M25562 Pain in left knee: Secondary | ICD-10-CM | POA: Insufficient documentation

## 2017-10-30 DIAGNOSIS — G8929 Other chronic pain: Secondary | ICD-10-CM | POA: Insufficient documentation

## 2017-10-30 DIAGNOSIS — M170 Bilateral primary osteoarthritis of knee: Secondary | ICD-10-CM | POA: Diagnosis not present

## 2017-10-30 DIAGNOSIS — Z7982 Long term (current) use of aspirin: Secondary | ICD-10-CM | POA: Insufficient documentation

## 2017-10-30 DIAGNOSIS — M25561 Pain in right knee: Secondary | ICD-10-CM

## 2017-10-30 DIAGNOSIS — Z79899 Other long term (current) drug therapy: Secondary | ICD-10-CM | POA: Diagnosis not present

## 2017-10-30 DIAGNOSIS — Z79891 Long term (current) use of opiate analgesic: Secondary | ICD-10-CM | POA: Diagnosis not present

## 2017-10-30 MED ORDER — METHYLPREDNISOLONE ACETATE 80 MG/ML IJ SUSP
80.0000 mg | Freq: Once | INTRAMUSCULAR | Status: AC
  Administered 2017-10-30: 80 mg via INTRA_ARTICULAR
  Filled 2017-10-30: qty 1

## 2017-10-30 MED ORDER — LACTATED RINGERS IV SOLN: 1000.0000 mL | Freq: Once | INTRAVENOUS | Status: DC

## 2017-10-30 MED ORDER — ROPIVACAINE HCL 2 MG/ML IJ SOLN
2.0000 mL | Freq: Once | INTRAMUSCULAR | Status: AC
  Administered 2017-10-30: 10 mL via INTRA_ARTICULAR
  Filled 2017-10-30: qty 10

## 2017-10-30 MED ORDER — LIDOCAINE HCL 2 % IJ SOLN
20.0000 mL | Freq: Once | INTRAMUSCULAR | Status: AC
  Administered 2017-10-30: 400 mg
  Filled 2017-10-30: qty 20

## 2017-10-30 MED ORDER — LIDOCAINE HCL (PF) 1 % IJ SOLN
5.0000 mL | Freq: Once | INTRAMUSCULAR | Status: AC
  Administered 2017-10-30: 5 mL
  Filled 2017-10-30: qty 5

## 2017-10-30 MED ORDER — FENTANYL CITRATE (PF) 100 MCG/2ML IJ SOLN: 25.0000 ug | INTRAMUSCULAR | Status: DC | PRN

## 2017-10-30 MED ORDER — MIDAZOLAM HCL 5 MG/5ML IJ SOLN: 1.0000 mg | INTRAMUSCULAR | Status: DC | PRN

## 2017-10-30 NOTE — Patient Instructions (Addendum)
____________________________________________________________________________________________  Post-Procedure Discharge Instructions  Instructions:  Apply ice: Fill a plastic sandwich bag with crushed ice. Cover it with a small towel and apply to injection site. Apply for 15 minutes then remove x 15 minutes. Repeat sequence on day of procedure, until you go to bed. The purpose is to minimize swelling and discomfort after procedure.  Apply heat: Apply heat to procedure site starting the day following the procedure. The purpose is to treat any soreness and discomfort from the procedure.  Food intake: Start with clear liquids (like water) and advance to regular food, as tolerated.   Physical activities: Keep activities to a minimum for the first 8 hours after the procedure.   Driving: If you have received any sedation, you are not allowed to drive for 24 hours after your procedure.  Blood thinner: Restart your blood thinner 6 hours after your procedure. (Only for those taking blood thinners)  Insulin: As soon as you can eat, you may resume your normal dosing schedule. (Only for those taking insulin)  Infection prevention: Keep procedure site clean and dry.  Post-procedure Pain Diary: Extremely important that this be done correctly and accurately. Recorded information will be used to determine the next step in treatment.  Pain evaluated is that of treated area only. Do not include pain from an untreated area.  Complete every hour, on the hour, for the initial 8 hours. Set an alarm to help you do this part accurately.  Do not go to sleep and have it completed later. It will not be accurate.  Follow-up appointment: Keep your follow-up appointment after the procedure. Usually 2 weeks for most procedures. (6 weeks in the case of radiofrequency.) Bring you pain diary.   Expect:  From numbing medicine (AKA: Local Anesthetics): Numbness or decrease in pain.  Onset: Full effect within 15  minutes of injected.  Duration: It will depend on the type of local anesthetic used. On the average, 1 to 8 hours.   From steroids: Decrease in swelling or inflammation. Once inflammation is improved, relief of the pain will follow.  Onset of benefits: Depends on the amount of swelling present. The more swelling, the longer it will take for the benefits to be seen. In some cases, up to 10 days.  Duration: Steroids will stay in the system x 2 weeks. Duration of benefits will depend on multiple posibilities including persistent irritating factors.  From procedure: Some discomfort is to be expected once the numbing medicine wears off. This should be minimal if ice and heat are applied as instructed.  Call if:  You experience numbness and weakness that gets worse with time, as opposed to wearing off.  New onset bowel or bladder incontinence. (This applies to Spinal procedures only)  Emergency Numbers:  Durning business hours (Monday - Thursday, 8:00 AM - 4:00 PM) (Friday, 9:00 AM - 12:00 Noon): [phone]  After hours: [phone] ____________________________________________________________________________________________   ____________________________________________________________________________________________  Preparing for your procedure (without sedation)  Instructions: . Oral Intake: Do not eat or drink anything for at least 3 hours prior to your procedure. . Transportation: Unless otherwise stated by your physician, you may drive yourself after the procedure. . Blood Pressure Medicine: Take your blood pressure medicine with a sip of water the morning of the procedure. . Blood thinners:  . Diabetics on insulin: Notify the staff so that you can be scheduled 1st case in the morning. If your diabetes requires high dose insulin, take only  of your normal insulin dose   the morning of the procedure and notify the staff that you have done so. . Preventing infections: Shower  with an antibacterial soap the morning of your procedure.  . Build-up your immune system: Take 1000 mg of Vitamin C with every meal (3 times a day) the day prior to your procedure. . Antibiotics: Inform the staff if you have a condition or reason that requires you to take antibiotics before dental procedures. . Pregnancy: If you are pregnant, call and cancel the procedure. . Sickness: If you have a cold, fever, or any active infections, call and cancel the procedure. . Arrival: You must be in the facility at least 30 minutes prior to your scheduled procedure. . Children: Do not bring any children with you. . Dress appropriately: Bring dark clothing that you would not mind if they get stained. . Valuables: Do not bring any jewelry or valuables.  Procedure appointments are reserved for interventional treatments only. . No Prescription Refills. . No medication changes will be discussed during procedure appointments. . No disability issues will be discussed.  Remember:  Regular Business hours are:  Monday to Thursday 8:00 AM to 4:00 PM  Provider's Schedule: Wanda Guion, MD:  Procedure days: Tuesday and Thursday 7:30 AM to 4:00 PM  Wanda Lateef, MD:  Procedure days: Monday and Wednesday 7:30 AM to 4:00 PM ____________________________________________________________________________________________    

## 2017-10-30 NOTE — Progress Notes (Signed)
Patient's Name: Wanda [name]  MRN: [id number]  Referring Provider: Colon Branch, MD  DOB: 27-Oct-1945  PCP: Colon Branch, MD  DOS: 10/30/2017  Note by: Gaspar Cola, MD  Service setting: Ambulatory outpatient  Specialty: Interventional Pain Management  Patient type: Established  Location: [facility] (AMB) Pain Management Facility  Visit type: Interventional Procedure   Primary Reason for Visit: Interventional Pain Management Treatment. CC: Knee Pain (left)  Procedure:  Anesthesia, Analgesia, Anxiolysis:  Type: Diagnostic Intra-Articular Local anesthetic and steroid Knee Injection #1  Region: Lateral infrapatellar Knee Region Level: Knee Joint Laterality: Left knee  Type: Local Anesthesia Indication(s): Analgesia         Local Anesthetic: Lidocaine 1-2% Route: Infiltration ([place]/IM) IV Access: Declined Sedation: Declined    Indications: 1. Primary osteoarthritis of both knees   2. Chronic pain of both knees    Pain Score: Pre-procedure: 3 /10 Post-procedure: 0-No pain/10  Pre-op Assessment:  Wanda [name] is a 72 y.o. (year old), female patient, seen today for interventional treatment. She  has a past surgical history that includes G3 P3 . Wanda [name] has a current medication list which includes the following prescription(s): aspirin ec, atorvastatin, vitamin d, cyclobenzaprine, gabapentin, hydrochlorothiazide, hydrocodone-acetaminophen, hydrocodone-acetaminophen, hydrocodone-acetaminophen, lisinopril, and sertraline. Her primarily concern today is the Knee Pain (left)  Initial Vital Signs:  Pulse Rate: 86 Temp: 98.2 F (36.8 C) Resp: 18 BP: (!) 150/91 SpO2: 100 %  BMI: Estimated body mass index is 43.4 kg/m as calculated from the following:   Height as of this encounter: 5\' 3"  (1.6 m).   Weight as of this encounter: 245 lb (111.1 kg).  Risk Assessment: Allergies: Reviewed. She is allergic to simvastatin.  Allergy Precautions: None required Coagulopathies: Reviewed. None  identified.  Blood-thinner therapy: None at this time Active Infection(s): Reviewed. None identified. Wanda [name] is afebrile  Site Confirmation: Wanda [name] was asked to confirm the procedure and laterality before marking the site Procedure checklist: Completed Consent: Before the procedure and under the influence of no sedative(s), amnesic(s), or anxiolytics, the patient was informed of the treatment options, risks and possible complications. To fulfill our ethical and legal obligations, as recommended by the American Medical Association's Code of Ethics, I have informed the patient of my clinical impression; the nature and purpose of the treatment or procedure; the risks, benefits, and possible complications of the intervention; the alternatives, including doing nothing; the risk(s) and benefit(s) of the alternative treatment(s) or procedure(s); and the risk(s) and benefit(s) of doing nothing. The patient was provided information about the general risks and possible complications associated with the procedure. These may include, but are not limited to: failure to achieve desired goals, infection, bleeding, organ or nerve damage, allergic reactions, paralysis, and death. In addition, the patient was informed of those risks and complications associated to the procedure, such as failure to decrease pain; infection; bleeding; organ or nerve damage with subsequent damage to sensory, motor, and/or autonomic systems, resulting in permanent pain, numbness, and/or weakness of one or several areas of the body; allergic reactions; (i.e.: anaphylactic reaction); and/or death. Furthermore, the patient was informed of those risks and complications associated with the medications. These include, but are not limited to: allergic reactions (i.e.: anaphylactic or anaphylactoid reaction(s)); adrenal axis suppression; blood sugar elevation that in diabetics may result in ketoacidosis or comma; water retention that in  patients with history of congestive heart failure may result in shortness of breath, pulmonary edema, and decompensation with resultant heart failure; weight gain; swelling  or edema; medication-induced neural toxicity; particulate matter embolism and blood vessel occlusion with resultant organ, and/or nervous system infarction; and/or aseptic necrosis of one or more joints. Finally, the patient was informed that Medicine is not an exact science; therefore, there is also the possibility of unforeseen or unpredictable risks and/or possible complications that may result in a catastrophic outcome. The patient indicated having understood very clearly. We have given the patient no guarantees and we have made no promises. Enough time was given to the patient to ask questions, all of which were answered to the patient's satisfaction. Wanda [name] has indicated that she wanted to continue with the procedure. Attestation: I, the ordering provider, attest that I have discussed with the patient the benefits, risks, side-effects, alternatives, likelihood of achieving goals, and potential problems during recovery for the procedure that I have provided informed consent. Date  Time: 10/30/2017  9:38 AM  Pre-Procedure Preparation:  Monitoring: As per clinic protocol. Respiration, ETCO2, SpO2, BP, heart rate and rhythm monitor placed and checked for adequate function Safety Precautions: Patient was assessed for positional comfort and pressure points before starting the procedure. Time-out: I initiated and conducted the "Time-out" before starting the procedure, as per protocol. The patient was asked to participate by confirming the accuracy of the "Time Out" information. Verification of the correct person, site, and procedure were performed and confirmed by me, the nursing staff, and the patient. "Time-out" conducted as per Joint Commission's Universal Protocol (UP.01.01.01). Time: 1109  Description of Procedure:        Position: Sitting Target Area: Knee Joint Approach: Just above the Lateral tibial plateau, lateral to the infrapatellar tendon. Area Prepped: Entire knee area, from the mid-thigh to the mid-shin. Prepping solution: ChloraPrep (2% chlorhexidine gluconate and 70% isopropyl alcohol) Safety Precautions: Aspiration looking for blood return was conducted prior to all injections. At no point did we inject any substances, as a needle was being advanced. No attempts were made at seeking any paresthesias. Safe injection practices and needle disposal techniques used. Medications properly checked for expiration dates. SDV (single dose vial) medications used. Description of the Procedure: Protocol guidelines were followed. The patient was placed in position over the fluoroscopy table. The target area was identified and the area prepped in the usual manner. Skin desensitized using vapocoolant spray. Skin & deeper tissues infiltrated with local anesthetic. Appropriate amount of time allowed to pass for local anesthetics to take effect. The procedure needles were then advanced to the target area. Proper needle placement secured. Negative aspiration confirmed. Solution injected in intermittent fashion, asking for systemic symptoms every 0.5cc of injectate. The needles were then removed and the area cleansed, making sure to leave some of the prepping solution back to take advantage of its long term bactericidal properties. Vitals:   10/30/17 0937 10/30/17 1013  BP: (!) 150/91 131/84  Pulse: 86 85  Resp: 18 18  Temp: 98.2 F (36.8 C)   TempSrc: Oral   SpO2: 100% 100%  Weight: 245 lb (111.1 kg)   Height: 5\' 3"  (1.6 m)     Start Time: 1109 hrs. End Time: 1011 hrs. Materials:  Needle(s) Type: Regular needle Gauge: 22G Length: 3.5-in Medication(s): Please see orders for medications and dosing details.  Imaging Guidance:  Type of Imaging Technique: None used Indication(s): N/A Exposure Time: No patient  exposure Contrast: None used. Fluoroscopic Guidance: N/A Ultrasound Guidance: N/A Interpretation: N/A  Antibiotic Prophylaxis:   Anti-infectives (From admission, onward)   None     Indication(s): None identified  Post-operative Assessment:  Post-procedure Vital Signs:  Pulse Rate: 85 Temp: 98.2 F (36.8 C) Resp: 18 BP: 131/84 SpO2: 100 %  EBL: None  Complications: No immediate post-treatment complications observed by team, or reported by patient.  Note: The patient tolerated the entire procedure well. A repeat set of vitals were taken after the procedure and the patient was kept under observation following institutional policy, for this type of procedure. Post-procedural neurological assessment was performed, showing return to baseline, prior to discharge. The patient was provided with post-procedure discharge instructions, including a section on how to identify potential problems. Should any problems arise concerning this procedure, the patient was given instructions to immediately contact us, at any time, without hesitation. In any case, we plan to contact the patient by telephone for a follow-up status report regarding this interventional procedure.  Comments:  No additional relevant information.  Plan of Care    Imaging Orders     DG C-Arm 1-60 Min-No Report  Procedure Orders     KNEE INJECTION  Medications ordered for procedure: Meds ordered this encounter  Medications  . lidocaine (XYLOCAINE) 2 % (with pres) injection 400 mg  . DISCONTD: midazolam (VERSED) 5 MG/5ML injection 1-2 mg    Make sure Flumazenil is available in the pyxis when using this medication. If oversedation occurs, administer 0.2 mg IV over 15 sec. If after 45 sec no response, administer 0.2 mg again over 1 min; may repeat at 1 min intervals; not to exceed 4 doses (1 mg)  . DISCONTD: fentaNYL (SUBLIMAZE) injection 25-50 mcg    Make sure Narcan is available in the pyxis when using this  medication. In the event of respiratory depression (RR< 8/min): Titrate NARCAN (naloxone) in increments of 0.1 to 0.2 mg IV at 2-3 minute intervals, until desired degree of reversal.  . DISCONTD: lactated ringers infusion 1,000 mL  . lidocaine (PF) (XYLOCAINE) 1 % injection 5 mL  . ropivacaine (PF) 2 mg/mL (0.2%) (NAROPIN) injection 2 mL  . methylPREDNISolone acetate (DEPO-MEDROL) injection 80 mg   Medications administered: We administered lidocaine, lidocaine (PF), ropivacaine (PF) 2 mg/mL (0.2%), and methylPREDNISolone acetate.  See the medical record for exact dosing, route, and time of administration.  New Prescriptions   No medications on file   Disposition: Discharge home  Discharge Date & Time: 10/30/2017; 1015 hrs.   Physician-requested Follow-up: Return for post-procedure eval (2 wks), w/ Dr. Dossie Arbour.  Future Appointments  Date Time Provider [place]  11/14/2017  9:15 AM Milinda Pointer, MD [facility] None  01/22/2018 10:45 AM Vevelyn Francois, NP [facility] None   Primary Care Physician: Colon Branch, MD Location: [facility] Facility Note by: Gaspar Cola, MD Date: 10/30/2017; Time: 10:23 AM  Disclaimer:  Medicine is not an exact science. The only guarantee in medicine is that nothing is guaranteed. It is important to note that the decision to proceed with this intervention was based on the information collected from the patient. The Data and conclusions were drawn from the patient's questionnaire, the interview, and the physical examination. Because the information was provided in large part by the patient, it cannot be guaranteed that it has not been purposely or unconsciously manipulated. Every effort has been made to obtain as much relevant data as possible for this evaluation. It is important to note that the conclusions that lead to this procedure are derived in large part from the available data. Always take into account that the treatment  will also be dependent on  availability of resources and existing treatment guidelines, considered by other Pain Management Practitioners as being common knowledge and practice, at the time of the intervention. For Medico-Legal purposes, it is also important to point out that variation in procedural techniques and pharmacological choices are the acceptable norm. The indications, contraindications, technique, and results of the above procedure should only be interpreted and judged by a Board-Certified Interventional Pain Specialist with extensive familiarity and expertise in the same exact procedure and technique.

## 2017-10-30 NOTE — Progress Notes (Signed)
Safety precautions to be maintained throughout the outpatient stay will include: orient to surroundings, keep bed in low position, maintain call bell within reach at all times, provide assistance with transfer out of bed and ambulation.  

## 2017-10-31 ENCOUNTER — Telehealth: Payer: Self-pay

## 2017-10-31 NOTE — Telephone Encounter (Signed)
No answer. Left message on AM to call if needed 

## 2017-11-02 ENCOUNTER — Other Ambulatory Visit: Payer: Self-pay | Admitting: Internal Medicine

## 2017-11-14 ENCOUNTER — Ambulatory Visit: Payer: Self-pay | Admitting: Pain Medicine

## 2017-11-21 ENCOUNTER — Encounter: Payer: Self-pay | Admitting: Nurse Practitioner

## 2017-11-21 ENCOUNTER — Ambulatory Visit: Payer: Medicare HMO | Attending: Pain Medicine | Admitting: Nurse Practitioner

## 2017-11-21 VITALS — BP 127/82 | HR 65 | Temp 98.2°F | Resp 16 | Ht 64.0 in | Wt 250.0 lb

## 2017-11-21 DIAGNOSIS — Z6841 Body Mass Index (BMI) 40.0 and over, adult: Secondary | ICD-10-CM | POA: Diagnosis not present

## 2017-11-21 DIAGNOSIS — M170 Bilateral primary osteoarthritis of knee: Secondary | ICD-10-CM

## 2017-11-21 DIAGNOSIS — M549 Dorsalgia, unspecified: Secondary | ICD-10-CM | POA: Insufficient documentation

## 2017-11-21 DIAGNOSIS — G894 Chronic pain syndrome: Secondary | ICD-10-CM

## 2017-11-21 DIAGNOSIS — I10 Essential (primary) hypertension: Secondary | ICD-10-CM | POA: Diagnosis not present

## 2017-11-21 DIAGNOSIS — Z833 Family history of diabetes mellitus: Secondary | ICD-10-CM | POA: Diagnosis not present

## 2017-11-21 DIAGNOSIS — M48061 Spinal stenosis, lumbar region without neurogenic claudication: Secondary | ICD-10-CM | POA: Diagnosis not present

## 2017-11-21 DIAGNOSIS — Z5181 Encounter for therapeutic drug level monitoring: Secondary | ICD-10-CM | POA: Diagnosis not present

## 2017-11-21 DIAGNOSIS — M25562 Pain in left knee: Secondary | ICD-10-CM

## 2017-11-21 DIAGNOSIS — Z79891 Long term (current) use of opiate analgesic: Secondary | ICD-10-CM | POA: Diagnosis not present

## 2017-11-21 DIAGNOSIS — G8929 Other chronic pain: Secondary | ICD-10-CM

## 2017-11-21 DIAGNOSIS — Z808 Family history of malignant neoplasm of other organs or systems: Secondary | ICD-10-CM | POA: Insufficient documentation

## 2017-11-21 DIAGNOSIS — M7918 Myalgia, other site: Secondary | ICD-10-CM | POA: Diagnosis not present

## 2017-11-21 DIAGNOSIS — Z79899 Other long term (current) drug therapy: Secondary | ICD-10-CM | POA: Insufficient documentation

## 2017-11-21 DIAGNOSIS — M25561 Pain in right knee: Secondary | ICD-10-CM

## 2017-11-21 DIAGNOSIS — Z809 Family history of malignant neoplasm, unspecified: Secondary | ICD-10-CM | POA: Insufficient documentation

## 2017-11-21 DIAGNOSIS — M797 Fibromyalgia: Secondary | ICD-10-CM | POA: Insufficient documentation

## 2017-11-21 DIAGNOSIS — Z7982 Long term (current) use of aspirin: Secondary | ICD-10-CM | POA: Diagnosis not present

## 2017-11-21 DIAGNOSIS — E785 Hyperlipidemia, unspecified: Secondary | ICD-10-CM | POA: Diagnosis not present

## 2017-11-21 NOTE — Progress Notes (Signed)
Safety precautions to be maintained throughout the outpatient stay will include: orient to surroundings, keep bed in low position, maintain call bell within reach at all times, provide assistance with transfer out of bed and ambulation.  

## 2017-11-21 NOTE — Patient Instructions (Addendum)
____________________________________________________________________________________________  Medication Rules  Applies to: All patients receiving prescriptions (written or electronic).  Pharmacy of record: Pharmacy where electronic prescriptions will be sent. If written prescriptions are taken to a different pharmacy, please inform the nursing staff. The pharmacy listed in the electronic medical record should be the one where you would like electronic prescriptions to be sent.  Prescription refills: Only during scheduled appointments. Applies to both, written and electronic prescriptions.  NOTE: The following applies primarily to controlled substances (Opioid* Pain Medications).   Patient's responsibilities: 1. Pain Pills: Bring all pain pills to every appointment (except for procedure appointments). 2. Pill Bottles: Bring pills in original pharmacy bottle. Always bring newest bottle. Bring bottle, even if empty. 3. Medication refills: You are responsible for knowing and keeping track of what medications you need refilled. The day before your appointment, write a list of all prescriptions that need to be refilled. Bring that list to your appointment and give it to the admitting nurse. Prescriptions will be written only during appointments. If you forget a medication, it will not be "Called in", "Faxed", or "electronically sent". You will need to get another appointment to get these prescribed. 4. Prescription Accuracy: You are responsible for carefully inspecting your prescriptions before leaving our office. Have the discharge nurse carefully go over each prescription with you, before taking them home. Make sure that your name is accurately spelled, that your address is correct. Check the name and dose of your medication to make sure it is accurate. Check the number of pills, and the written instructions to make sure they are clear and accurate. Make sure that you are given enough medication to last  until your next medication refill appointment. 5. Taking Medication: Take medication as prescribed. Never take more pills than instructed. Never take medication more frequently than prescribed. Taking less pills or less frequently is permitted and encouraged, when it comes to controlled substances (written prescriptions).  6. Inform other Doctors: Always inform, all of your healthcare providers, of all the medications you take. 7. Pain Medication from other Providers: You are not allowed to accept any additional pain medication from any other Doctor or Healthcare provider. There are two exceptions to this rule. (see below) In the event that you require additional pain medication, you are responsible for notifying us, as stated below. 8. Medication Agreement: You are responsible for carefully reading and following our Medication Agreement. This must be signed before receiving any prescriptions from our practice. Safely store a copy of your signed Agreement. Violations to the Agreement will result in no further prescriptions. (Additional copies of our Medication Agreement are available upon request.) 9. Laws, Rules, & Regulations: All patients are expected to follow all Federal and State Laws, Statutes, Rules, & Regulations. Ignorance of the Laws does not constitute a valid excuse. The use of any illegal substances is prohibited. 10. Adopted CDC guidelines & recommendations: Target dosing levels will be at or below 60 MME/day. Use of benzodiazepines** is not recommended.  Exceptions: There are only two exceptions to the rule of not receiving pain medications from other Healthcare Providers. 1. Exception #1 (Emergencies): In the event of an emergency (i.e.: accident requiring emergency care), you are allowed to receive additional pain medication. However, you are responsible for: As soon as you are able, call our office [phone], at any time of the day or night, and leave a message stating your name, the  date and nature of the emergency, and the name and dose of the medication   prescribed. In the event that your call is answered by a member of our staff, make sure to document and save the date, time, and the name of the person that took your information.  2. Exception #2 (Planned Surgery): In the event that you are scheduled by another doctor or dentist to have any type of surgery or procedure, you are allowed (for a period no longer than 30 days), to receive additional pain medication, for the acute post-op pain. However, in this case, you are responsible for picking up a copy of our "Post-op Pain Management for Surgeons" handout, and giving it to your surgeon or dentist. This document is available at our office, and does not require an appointment to obtain it. Simply go to our office during business hours (Monday-Thursday from 8:00 AM to 4:00 PM) (Friday 8:00 AM to 12:00 Noon) or if you have a scheduled appointment with [place], prior to your surgery, and ask for it by name. In addition, you will need to provide [place] with your name, name of your surgeon, type of surgery, and date of procedure or surgery.  *Opioid medications include: morphine, codeine, oxycodone, oxymorphone, hydrocodone, hydromorphone, meperidine, tramadol, tapentadol, buprenorphine, fentanyl, methadone. **Benzodiazepine medications include: diazepam (Valium), alprazolam (Xanax), clonazepam (Klonopine), lorazepam (Ativan), clorazepate (Tranxene), chlordiazepoxide (Librium), estazolam (Prosom), oxazepam (Serax), temazepam (Restoril), triazolam (Halcion) (Last updated: 09/20/2017) ____________________________________________________________________________________________   BMI Assessment: Estimated body mass index is 42.91 kg/m as calculated from the following:   Height as of this encounter: 5\' 4"  (1.626 m).   Weight as of this encounter: 250 lb (113.4 kg).  BMI interpretation table: BMI level Category Range association with higher  incidence of chronic pain  <18 kg/m2 Underweight   18.5-24.9 kg/m2 Ideal body weight   25-29.9 kg/m2 Overweight Increased incidence by 20%  30-34.9 kg/m2 Obese (Class I) Increased incidence by 68%  35-39.9 kg/m2 Severe obesity (Class II) Increased incidence by 136%  >40 kg/m2 Extreme obesity (Class III) Increased incidence by 254%   Patient's current BMI Ideal Body weight  Body mass index is 42.91 kg/m. Ideal body weight: 54.7 kg (120 lb 9.5 oz) Adjusted ideal body weight: 78.2 kg (172 lb 5.7 oz)   BMI Readings from Last 4 Encounters:  11/21/17 42.91 kg/m  10/30/17 43.40 kg/m  10/23/17 43.40 kg/m  07/27/17 43.77 kg/m   Wt Readings from Last 4 Encounters:  11/21/17 250 lb (113.4 kg)  10/30/17 245 lb (111.1 kg)  10/23/17 245 lb (111.1 kg)  07/27/17 255 lb (115.7 kg)    Knee Injection A knee injection is a procedure to get medicine into your knee joint. Your health care provider puts a needle into the joint and injects medicine with an attached syringe. The injected medicine may relieve the pain, swelling, and stiffness of arthritis. The injected medicine may also help to lubricate and cushion your knee joint. You may need more than one injection. Tell a health care provider about:  Any allergies you have.  All medicines you are taking, including vitamins, herbs, eye drops, creams, and over-the-counter medicines.  Any problems you or family members have had with anesthetic medicines.  Any blood disorders you have.  Any surgeries you have had.  Any medical conditions you have. What are the risks? Generally, this is a safe procedure. However, problems may occur, including:  Infection.  Bleeding.  Worsening symptoms.  Damage to the area around your knee.  Allergic reaction to any of the medicines.  Skin reactions from repeated injections.  What happens before the  procedure?  Ask your health care provider about changing or stopping your regular medicines. This  is especially important if you are taking diabetes medicines or blood thinners.  Plan to have someone take you home after the procedure. What happens during the procedure?  You will sit or lie down in a position for your knee to be treated.  The skin over your kneecap will be cleaned with a germ-killing solution (antiseptic).  You will be given a medicine that numbs the area (local anesthetic). You may feel some stinging.  After your knee becomes numb, you will have a second injection. This is the medicine. This needle is carefully placed between your kneecap and your knee. The medicine is injected into the joint space.  At the end of the procedure, the needle will be removed.  A bandage (dressing) may be placed over the injection site. The procedure may vary among health care providers and hospitals. What happens after the procedure?  You may have to move your knee through its full range of motion. This helps to get all of the medicine into your joint space.  Your blood pressure, heart rate, breathing rate, and blood oxygen level will be monitored often until the medicines you were given have worn off.  You will be watched to make sure that you do not have a reaction to the injected medicine. This information is not intended to replace advice given to you by your health care provider. Make sure you discuss any questions you have with your health care provider. Document Released: 10/01/2006 Document Revised: 12/10/2015 Document Reviewed: 05/20/2014 Elsevier Interactive Patient Education  2018 [employer].

## 2017-11-21 NOTE — Progress Notes (Signed)
Patient's Name: Wanda [name]  MRN: [id number]  Referring Provider: Colon Branch, MD  DOB: Jan 06, 1946  PCP: Colon Branch, MD  DOS: 11/21/2017  Note by: Vevelyn Francois NP  Service setting: Ambulatory outpatient  Specialty: Interventional Pain Management  Location: [facility] (AMB) Pain Management Facility    Patient type: Established    Primary Reason(s) for Visit: Encounter for prescription drug management & post-procedure evaluation of chronic illness with mild to moderate exacerbation(Level of risk: moderate) CC: Knee Pain (left )  HPI  Wanda [name] is a 72 y.o. year old, female patient, who comes today for a post-procedure evaluation and medication management. 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 Her primarily concern today is the Knee Pain (left )  Pain Assessment: Location: Left Knee Radiating:  sometimes into the bottom of the leg Onset: More than a month ago Duration: Chronic pain Quality: Discomfort, Shooting(this happens when she is up on it) Severity: 2 /10 (self-reported pain score)  Note: Reported level is compatible with observation.                          Effect on ADL: upon rising the knee can really have a shooting pain.  swelling is much improved.  Timing: Intermittent Modifying factors: procedure, rest, medications  Further details on both, my assessment(s), as well as the proposed treatment plan, please see below. She admits that she is SP right knee Hyalgan series which was effective. She wonders if this may be beneficial for her left knee.  Post-Procedure Assessment  10/30/2017 Procedure: Left intra-articular knee injection Pre-procedure pain score:  3/10 Post-procedure pain score: 0/10         Influential Factors: BMI: 42.91 kg/m Intra-procedural challenges: None observed.         Assessment challenges: None detected.              Reported side-effects: None.        Post-procedural adverse reactions or complications: None reported         Sedation: Please see nurses note. When no sedatives are used, the analgesic levels obtained are directly associated to the effectiveness of the local anesthetics. However, when sedation is provided, the level of analgesia obtained during the initial 1 hour following the intervention, is believed to be the result of a combination of factors. These factors may include, but are not limited to: 1. The effectiveness of the local anesthetics used. 2. The effects of the analgesic(s) and/or anxiolytic(s) used. 3. The degree of discomfort experienced by the patient at the time of the procedure. 4. The  patients ability and reliability in recalling and recording the events. 5. The presence and influence of possible secondary gains and/or psychosocial factors. Reported result: Relief experienced during the 1st hour after the procedure:  100 % (Ultra-Short Term Relief)            Interpretative annotation: Clinically appropriate result. Analgesia during this period is likely to be Local Anesthetic and/or IV Sedative (Analgesic/Anxiolytic) related.          Effects of local anesthetic: The analgesic effects attained during this period are directly associated to the localized infiltration of local anesthetics and therefore cary significant diagnostic value as to the etiological location, or anatomical origin, of the pain. Expected duration of relief is directly dependent on the pharmacodynamics of the local anesthetic used. Long-acting (4-6 hours) anesthetics used.  Reported result: Relief during the next 4 to 6 hour after the procedure: 100 % (Short-Term Relief)            Interpretative annotation: Clinically appropriate result. Analgesia during this period is likely to be Local Anesthetic-related.          Long-term benefit: Defined as the period of time past the expected duration of local anesthetics (1 hour for short-acting and 4-6 hours for long-acting). With the possible exception of prolonged sympathetic blockade from the local anesthetics, benefits during this period are typically attributed to, or associated with, other factors such as analgesic sensory neuropraxia, antiinflammatory effects, or beneficial biochemical changes provided by agents other than the local anesthetics.  Reported result: Extended relief following procedure: 75 % (Long-Term Relief)            Interpretative annotation: Clinically appropriate result. Good relief. No permanent benefit expected. Inflammation plays a part in the etiology to the pain.          Current benefits: Defined as reported results that persistent at this point in time.   Analgesia: 50 %            Function: Somewhat improved ROM: Somewhat improved Interpretative annotation: Recurrence of symptoms. No permanent benefit expected. Effective diagnostic intervention.           Interpretation: Results would suggest a successful diagnostic intervention.                  Plan:  Please see "Plan of Care" for details.                Laboratory Chemistry  Inflammation Markers (CRP: Acute Phase) (ESR: Chronic Phase) Lab Results  Component Value Date   CRP <0.5 08/18/2015   ESRSEDRATE 28 08/18/2015                         Rheumatology Markers No results found for: RF, ANA, Therisa Doyne, [facility]                      Renal Function Markers Lab Results  Component Value Date   BUN 10 08/30/2016   CREATININE 0.65 08/30/2016   GFRAA >60 08/18/2015   GFRNONAA >60 08/18/2015                              Hepatic Function Markers Lab Results  Component Value Date   AST 19 08/30/2016   ALT 19 08/30/2016   ALBUMIN 4.2 08/30/2016   ALKPHOS 74 08/30/2016   HCVAB NEGATIVE 08/30/2016  Electrolytes Lab Results  Component Value Date   NA 136 08/30/2016   K 4.2 08/30/2016   CL 102 08/30/2016   CALCIUM 9.8 08/30/2016   MG 2.0 08/18/2015                        Neuropathy Markers No results found for: Wilfred Lacy, HIV                      Bone Pathology Markers Lab Results  Component Value Date   VD25OH 37 05/01/2011   VD125OH2TOT 58 02/01/2015   [id number] 58 02/01/2015   [id number] <8 02/01/2015                         Coagulation Parameters Lab Results  Component Value Date   PLT 176.0 08/30/2016                        Cardiovascular Markers Lab Results  Component Value Date   HGB 13.8 08/30/2016   HCT 40.9 08/30/2016                         CA Markers No results found for: CEA, CA125, LABCA2                      Note: Lab results reviewed.  Recent Diagnostic Imaging Results  MM DIAG BREAST TOMO BILATERAL CLINICAL DATA:  Annual examination of both breasts and follow-up of probably benign calcifications in the outer left breast.  EXAM: 2D DIGITAL DIAGNOSTIC BILATERAL MAMMOGRAM  WITH CAD AND ADJUNCT TOMO  COMPARISON:  Previous exam(s).  ACR Breast Density Category b: There are scattered areas of fibroglandular density.  FINDINGS: The coarse dystrophic benign-appearing calcifications in the upper-outer left breast are unchanged. No suspicious microcalcifications are identified in either breast to suggest malignancy. No mass or architectural distortion is identified in either breast to suggest malignancy.  Mammographic images were processed with CAD.  IMPRESSION: Stable benign appearing dystrophic calcifications in the upper-outer left breast. No evidence of malignancy in either breast.  RECOMMENDATION: Screening mammogram in one year.(Code:[id number])  I have discussed the findings and recommendations with the patient. Results were also provided in writing at the conclusion of the visit. If applicable, a reminder letter will be sent to the patient regarding the next appointment.  BI-RADS CATEGORY  2: Benign.  Electronically Signed   By: Curlene Dolphin M.D.   On: 06/01/2017 15:32  Complexity Note: Imaging results reviewed. Results shared with Wanda [name], using Layman's terms.                         Meds   Current Outpatient Medications:  .  aspirin EC 81 MG tablet, Take 81 mg by mouth daily., Disp: , Rfl:  .  atorvastatin (LIPITOR) 10 MG tablet, Take 1 tablet (10 mg total) by mouth at bedtime., Disp: 90 tablet, Rfl: 3 .  Cholecalciferol (VITAMIN D) 2000 UNITS tablet, Take 2,000 Units by mouth daily., Disp: , Rfl:  .  cyclobenzaprine (FLEXERIL) 10 MG tablet, Take 1 tablet (10 mg total) by mouth at bedtime., Disp: 30 tablet, Rfl: 2 .  gabapentin (NEURONTIN) 300 MG capsule, Take 1 capsule (300 mg total) by mouth 3 (three) times daily., Disp: 90 capsule, Rfl: 2 .  hydrochlorothiazide (HYDRODIURIL) 25 MG tablet, Take 1 tablet (  25 mg total) by mouth daily., Disp: 90 tablet, Rfl: 3 .  [START ON 01/01/2018] HYDROcodone-acetaminophen (NORCO/VICODIN) 5-325 MG  tablet, Take 1 tablet by mouth every 6 (six) hours as needed for severe pain., Disp: 120 tablet, Rfl: 0 .  HYDROcodone-acetaminophen (NORCO/VICODIN) 5-325 MG tablet, Take 1 tablet by mouth every 6 (six) hours as needed for severe pain., Disp: 120 tablet, Rfl: 0 .  [START ON 12/02/2017] HYDROcodone-acetaminophen (NORCO/VICODIN) 5-325 MG tablet, Take 1 tablet by mouth every 6 (six) hours as needed for moderate pain., Disp: 120 tablet, Rfl: 0 .  lisinopril (PRINIVIL,ZESTRIL) 10 MG tablet, Take 1 tablet (10 mg total) by mouth daily., Disp: 30 tablet, Rfl: 0 .  sertraline (ZOLOFT) 50 MG tablet, Take 3 tablets (150 mg total) by mouth daily., Disp: 90 tablet, Rfl: 0  ROS  Constitutional: Denies any fever or chills Gastrointestinal: No reported hemesis, hematochezia, vomiting, or acute GI distress Musculoskeletal: Denies any acute onset joint swelling, redness, loss of ROM, or weakness Neurological: No reported episodes of acute onset apraxia, aphasia, dysarthria, agnosia, amnesia, paralysis, loss of coordination, or loss of consciousness  Allergies  Wanda [name] is allergic to simvastatin.  [place]  Drug: Wanda [name]  reports that she does not use drugs. Alcohol:  reports that she does not drink alcohol. Tobacco:  reports that she has never smoked. She has never used smokeless tobacco. Medical:  has a past medical history of Allergy, Chronic back pain, Depression, DJD (degenerative joint disease), Hyperlipidemia, Hypertension, Menopause, and TMJ PAIN (05/17/2010). Surgical: Wanda [name]  has a past surgical history that includes G3 P3 . Family: family history includes Breast cancer in her sister; Cancer in her brother and sister; Colon cancer in her brother; Diabetes in her unknown relative.  Constitutional Exam  General appearance: Well nourished, well developed, and well hydrated. In no apparent acute distress Vitals:   11/21/17 0955  BP: 127/82  Pulse: 65  Resp: 16  Temp: 98.2 F (36.8 C)    TempSrc: Oral  SpO2: 100%  Weight: 250 lb (113.4 kg)  Height: _0  (1.626 m)  Psych/Mental status: Alert, oriented x 3 (person, place, & time)       Eyes: PERLA Respiratory: No evidence of acute respiratory distress  Gait & Posture Assessment  Ambulation: Unassisted Gait: Relatively normal for age and body habitus Posture: WNL   Lower Extremity Exam    Side: Right lower extremity  Side: Left lower extremity  Skin & Extremity Inspection: Skin color, temperature, and hair growth are WNL. No peripheral edema or cyanosis. No masses, redness, swelling, asymmetry, or associated skin lesions. No contractures.  Skin & Extremity Inspection: Edema  Functional ROM: Unrestricted ROM          Functional ROM: Guarding          Muscle Tone/Strength: Functionally intact. No obvious neuro-muscular anomalies detected.  Muscle Tone/Strength: Functionally intact. No obvious neuro-muscular anomalies detected.  Sensory (Neurological): Unimpaired  Sensory (Neurological): Unimpaired  Palpation: No palpable anomalies  Palpation: Tender   Assessment  Primary Diagnosis & Pertinent Problem List: The primary encounter diagnosis was Chronic knee pain (Bilateral) (L>R). Diagnoses of Osteoarthritis of knee (Bilateral), Musculoskeletal pain, and Chronic pain syndrome were also pertinent to this visit.  Status Diagnosis  Persistent Persistent Persistent 1. Chronic knee pain (Bilateral) (L>R)   2. Osteoarthritis of knee (Bilateral)   3. Musculoskeletal pain   4. Chronic pain syndrome     Problems updated and reviewed during this visit: No problems updated. Plan of Care  Pharmacotherapy (Medications Ordered): No orders of the defined types were placed in this encounter.  New Prescriptions   No medications on file   Medications administered today: Markela L. Fetterman had no medications administered during this visit. Lab-work, procedure(s), and/or referral(s): Orders Placed This Encounter  Procedures  .  KNEE INJECTION   Imaging and/or referral(s): None  Interventional therapies: Planned, scheduled, and/or pending:  left Hyalgan series #1   Considering:  Diagnostic/palliative left intra-articular knee injection.  Diagnostic bilateral lumbar facet block Left L4-5 lumbar epiduralsteroid injection   Palliative PRN treatment(s):  Palliative bilateral lumbar facet block Left L4-5 lumbar epiduralsteroid injection Palliative left intra-articular knee injection      Provider-requested follow-up: Return for Appointment As Scheduled.  Future Appointments  Date Time Provider [place]  11/23/2017  4:00 PM Barrie Folk, LCSW [facility] None  01/22/2018 10:45 AM Vevelyn Francois, NP [facility] None   Primary Care Physician: Colon Branch, MD Location: [facility] Out[facility] Management Facility Note by: Vevelyn Francois NP Date: 11/21/2017; Time: 10:22 AM  Pain Score Disclaimer: We use the NRS-11 scale. This is a self-reported, subjective measurement of pain severity with only modest accuracy. It is used primarily to identify changes within a particular patient. It must be understood that outpatient pain scales are significantly less accurate that those used for research, where they can be applied under ideal controlled circumstances with minimal exposure to variables. In reality, the score is likely to be a combination of pain intensity and pain affect, where pain affect describes the degree of emotional arousal or changes in action readiness caused by the sensory experience of pain. Factors such as social and work situation, setting, emotional state, anxiety levels, expectation, and prior pain experience may influence pain perception and show large inter-individual differences that may also be affected by time variables.  Patient instructions provided during this appointment: Patient Instructions    ____________________________________________________________________________________________  Medication Rules  Applies to: All patients receiving prescriptions (written or electronic).  Pharmacy of record: Pharmacy where electronic prescriptions will be sent. If written prescriptions are taken to a different pharmacy, please inform the nursing staff. The pharmacy listed in the electronic medical record should be the one where you would like electronic prescriptions to be sent.  Prescription refills: Only during scheduled appointments. Applies to both, written and electronic prescriptions.  NOTE: The following applies primarily to controlled substances (Opioid* Pain Medications).   Patient's responsibilities: 1. Pain Pills: Bring all pain pills to every appointment (except for procedure appointments). 2. Pill Bottles: Bring pills in original pharmacy bottle. Always bring newest bottle. Bring bottle, even if empty. 3. Medication refills: You are responsible for knowing and keeping track of what medications you need refilled. The day before your appointment, write a list of all prescriptions that need to be refilled. Bring that list to your appointment and give it to the admitting nurse. Prescriptions will be written only during appointments. If you forget a medication, it will not be "Called in", "Faxed", or "electronically sent". You will need to get another appointment to get these prescribed. 4. Prescription Accuracy: You are responsible for carefully inspecting your prescriptions before leaving our office. Have the discharge nurse carefully go over each prescription with you, before taking them home. Make sure that your name is accurately spelled, that your address is correct. Check the name and dose of your medication to make sure it is accurate. Check the number of pills, and the written instructions to make sure they are clear  and accurate. Make sure that you are given enough medication to  last until your next medication refill appointment. 5. Taking Medication: Take medication as prescribed. Never take more pills than instructed. Never take medication more frequently than prescribed. Taking less pills or less frequently is permitted and encouraged, when it comes to controlled substances (written prescriptions).  6. Inform other Doctors: Always inform, all of your healthcare providers, of all the medications you take. 7. Pain Medication from other Providers: You are not allowed to accept any additional pain medication from any other Doctor or Healthcare provider. There are two exceptions to this rule. (see below) In the event that you require additional pain medication, you are responsible for notifying us, as stated below. 8. Medication Agreement: You are responsible for carefully reading and following our Medication Agreement. This must be signed before receiving any prescriptions from our practice. Safely store a copy of your signed Agreement. Violations to the Agreement will result in no further prescriptions. (Additional copies of our Medication Agreement are available upon request.) 9. Laws, Rules, & Regulations: All patients are expected to follow all Federal and [employer], [employer], Rules, [employer]. Ignorance of the Laws does not constitute a valid excuse. The use of any illegal substances is prohibited. 10. Adopted CDC guidelines & recommendations: Target dosing levels will be at or below 60 MME/day. Use of benzodiazepines** is not recommended.  Exceptions: There are only two exceptions to the rule of not receiving pain medications from other Healthcare Providers. 1. Exception #1 (Emergencies): In the event of an emergency (i.e.: accident requiring emergency care), you are allowed to receive additional pain medication. However, you are responsible for: As soon as you are able, call our office [phone], at any time of the day or night, and leave a message stating your  name, the date and nature of the emergency, and the name and dose of the medication prescribed. In the event that your call is answered by a member of our staff, make sure to document and save the date, time, and the name of the person that took your information.  2. Exception #2 (Planned Surgery): In the event that you are scheduled by another doctor or dentist to have any type of surgery or procedure, you are allowed (for a period no longer than 30 days), to receive additional pain medication, for the acute post-op pain. However, in this case, you are responsible for picking up a copy of our "Post-op Pain Management for Surgeons" handout, and giving it to your surgeon or dentist. This document is available at our office, and does not require an appointment to obtain it. Simply go to our office during business hours (Monday-Thursday from 8:00 AM to 4:00 PM) (Friday 8:00 AM to 12:00 Noon) or if you have a scheduled appointment with [place], prior to your surgery, and ask for it by name. In addition, you will need to provide [place] with your name, name of your surgeon, type of surgery, and date of procedure or surgery.  *Opioid medications include: morphine, codeine, oxycodone, oxymorphone, hydrocodone, hydromorphone, meperidine, tramadol, tapentadol, buprenorphine, fentanyl, methadone. **Benzodiazepine medications include: diazepam (Valium), alprazolam (Xanax), clonazepam (Klonopine), lorazepam (Ativan), clorazepate (Tranxene), chlordiazepoxide (Librium), estazolam (Prosom), oxazepam (Serax), temazepam (Restoril), triazolam (Halcion) (Last updated: 09/20/2017) ____________________________________________________________________________________________   BMI Assessment: Estimated body mass index is 42.91 kg/m as calculated from the following:   Height as of this encounter: '5\' 4"'$  (1.626 m).   Weight as of this encounter: 250 lb (113.4 kg).  BMI interpretation  table: BMI level Category Range association with  higher incidence of chronic pain  <18 kg/m2 Underweight   18.5-24.9 kg/m2 Ideal body weight   25-29.9 kg/m2 Overweight Increased incidence by 20%  30-34.9 kg/m2 Obese (Class I) Increased incidence by 68%  35-39.9 kg/m2 Severe obesity (Class II) Increased incidence by 136%  >40 kg/m2 Extreme obesity (Class III) Increased incidence by 254%   Patient's current BMI Ideal Body weight  Body mass index is 42.91 kg/m. Ideal body weight: 54.7 kg (120 lb 9.5 oz) Adjusted ideal body weight: 78.2 kg (172 lb 5.7 oz)   BMI Readings from Last 4 Encounters:  11/21/17 42.91 kg/m  10/30/17 43.40 kg/m  10/23/17 43.40 kg/m  07/27/17 43.77 kg/m   Wt Readings from Last 4 Encounters:  11/21/17 250 lb (113.4 kg)  10/30/17 245 lb (111.1 kg)  10/23/17 245 lb (111.1 kg)  07/27/17 255 lb (115.7 kg)    Knee Injection A knee injection is a procedure to get medicine into your knee joint. Your health care provider puts a needle into the joint and injects medicine with an attached syringe. The injected medicine may relieve the pain, swelling, and stiffness of arthritis. The injected medicine may also help to lubricate and cushion your knee joint. You may need more than one injection. Tell a health care provider about:  Any allergies you have.  All medicines you are taking, including vitamins, herbs, eye drops, creams, and over-the-counter medicines.  Any problems you or family members have had with anesthetic medicines.  Any blood disorders you have.  Any surgeries you have had.  Any medical conditions you have. What are the risks? Generally, this is a safe procedure. However, problems may occur, including:  Infection.  Bleeding.  Worsening symptoms.  Damage to the area around your knee.  Allergic reaction to any of the medicines.  Skin reactions from repeated injections.  What happens before the procedure?  Ask your health care provider about changing or stopping your regular medicines.  This is especially important if you are taking diabetes medicines or blood thinners.  Plan to have someone take you home after the procedure. What happens during the procedure?  You will sit or lie down in a position for your knee to be treated.  The skin over your kneecap will be cleaned with a germ-killing solution (antiseptic).  You will be given a medicine that numbs the area (local anesthetic). You may feel some stinging.  After your knee becomes numb, you will have a second injection. This is the medicine. This needle is carefully placed between your kneecap and your knee. The medicine is injected into the joint space.  At the end of the procedure, the needle will be removed.  A bandage (dressing) may be placed over the injection site. The procedure may vary among health care providers and hospitals. What happens after the procedure?  You may have to move your knee through its full range of motion. This helps to get all of the medicine into your joint space.  Your blood pressure, heart rate, breathing rate, and blood oxygen level will be monitored often until the medicines you were given have worn off.  You will be watched to make sure that you do not have a reaction to the injected medicine. This information is not intended to replace advice given to you by your health care provider. Make sure you discuss any questions you have with your health care provider. Document Released: 10/01/2006 Document Revised: 12/10/2015 Document Reviewed: 05/20/2014  Chartered certified accountant Patient Education  [employer].

## 2017-11-23 ENCOUNTER — Ambulatory Visit: Payer: Medicare HMO | Admitting: Psychology

## 2017-12-04 ENCOUNTER — Ambulatory Visit: Payer: Self-pay | Admitting: Pain Medicine

## 2017-12-08 ENCOUNTER — Other Ambulatory Visit: Payer: Self-pay | Admitting: Internal Medicine

## 2017-12-11 ENCOUNTER — Other Ambulatory Visit: Payer: Self-pay

## 2017-12-11 ENCOUNTER — Ambulatory Visit: Payer: Medicare HMO | Attending: Nurse Practitioner | Admitting: Pain Medicine

## 2017-12-11 ENCOUNTER — Encounter: Payer: Self-pay | Admitting: Pain Medicine

## 2017-12-11 VITALS — BP 115/61 | HR 70 | Temp 98.4°F | Resp 16 | Ht 64.0 in | Wt 250.0 lb

## 2017-12-11 DIAGNOSIS — Z888 Allergy status to other drugs, medicaments and biological substances status: Secondary | ICD-10-CM | POA: Insufficient documentation

## 2017-12-11 DIAGNOSIS — M25561 Pain in right knee: Secondary | ICD-10-CM

## 2017-12-11 DIAGNOSIS — Z79891 Long term (current) use of opiate analgesic: Secondary | ICD-10-CM | POA: Insufficient documentation

## 2017-12-11 DIAGNOSIS — M170 Bilateral primary osteoarthritis of knee: Secondary | ICD-10-CM | POA: Diagnosis not present

## 2017-12-11 DIAGNOSIS — M25562 Pain in left knee: Secondary | ICD-10-CM | POA: Insufficient documentation

## 2017-12-11 DIAGNOSIS — Z7982 Long term (current) use of aspirin: Secondary | ICD-10-CM | POA: Insufficient documentation

## 2017-12-11 DIAGNOSIS — G8929 Other chronic pain: Secondary | ICD-10-CM | POA: Insufficient documentation

## 2017-12-11 DIAGNOSIS — M7918 Myalgia, other site: Secondary | ICD-10-CM

## 2017-12-11 DIAGNOSIS — Z79899 Other long term (current) drug therapy: Secondary | ICD-10-CM | POA: Insufficient documentation

## 2017-12-11 MED ORDER — SODIUM HYALURONATE (VISCOSUP) 20 MG/2ML IX SOSY
2.0000 mL | PREFILLED_SYRINGE | Freq: Once | INTRA_ARTICULAR | Status: DC
  Administered 2017-12-11: 2 mL via INTRA_ARTICULAR

## 2017-12-11 MED ORDER — ROPIVACAINE HCL 2 MG/ML IJ SOLN
4.0000 mL | Freq: Once | INTRAMUSCULAR | Status: DC
  Administered 2017-12-11: 4 mL via INTRA_ARTICULAR
  Filled 2017-12-11: qty 10

## 2017-12-11 MED ORDER — LIDOCAINE HCL (PF) 1 % IJ SOLN
4.0000 mL | Freq: Once | INTRAMUSCULAR | Status: DC
  Administered 2017-12-11: 4 mL
  Filled 2017-12-11: qty 5

## 2017-12-11 NOTE — Patient Instructions (Signed)
____________________________________________________________________________________________  Post-Procedure Discharge Instructions  Instructions:  Apply ice: Fill a plastic sandwich bag with crushed ice. Cover it with a small towel and apply to injection site. Apply for 15 minutes then remove x 15 minutes. Repeat sequence on day of procedure, until you go to bed. The purpose is to minimize swelling and discomfort after procedure.  Apply heat: Apply heat to procedure site starting the day following the procedure. The purpose is to treat any soreness and discomfort from the procedure.  Food intake: Start with clear liquids (like water) and advance to regular food, as tolerated.   Physical activities: Keep activities to a minimum for the first 8 hours after the procedure.   Driving: If you have received any sedation, you are not allowed to drive for 24 hours after your procedure.  Blood thinner: Restart your blood thinner 6 hours after your procedure. (Only for those taking blood thinners)  Insulin: As soon as you can eat, you may resume your normal dosing schedule. (Only for those taking insulin)  Infection prevention: Keep procedure site clean and dry.  Post-procedure Pain Diary: Extremely important that this be done correctly and accurately. Recorded information will be used to determine the next step in treatment.  Pain evaluated is that of treated area only. Do not include pain from an untreated area.  Complete every hour, on the hour, for the initial 8 hours. Set an alarm to help you do this part accurately.  Do not go to sleep and have it completed later. It will not be accurate.  Follow-up appointment: Keep your follow-up appointment after the procedure. Usually 2 weeks for most procedures. (6 weeks in the case of radiofrequency.) Bring you pain diary.   Expect:  From numbing medicine (AKA: Local Anesthetics): Numbness or decrease in pain.  Onset: Full effect within 15  minutes of injected.  Duration: It will depend on the type of local anesthetic used. On the average, 1 to 8 hours.   From steroids: Decrease in swelling or inflammation. Once inflammation is improved, relief of the pain will follow.  Onset of benefits: Depends on the amount of swelling present. The more swelling, the longer it will take for the benefits to be seen. In some cases, up to 10 days.  Duration: Steroids will stay in the system x 2 weeks. Duration of benefits will depend on multiple posibilities including persistent irritating factors.  From procedure: Some discomfort is to be expected once the numbing medicine wears off. This should be minimal if ice and heat are applied as instructed.  Call if:  You experience numbness and weakness that gets worse with time, as opposed to wearing off.  New onset bowel or bladder incontinence. (This applies to Spinal procedures only)  Emergency Numbers:  Durning business hours (Monday - Thursday, 8:00 AM - 4:00 PM) (Friday, 9:00 AM - 12:00 Noon): [phone]  After hours: [phone] ____________________________________________________________________________________________   ____________________________________________________________________________________________  Preparing for your procedure (without sedation)  Instructions: . Oral Intake: Do not eat or drink anything for at least 3 hours prior to your procedure. . Transportation: Unless otherwise stated by your physician, you may drive yourself after the procedure. . Blood Pressure Medicine: Take your blood pressure medicine with a sip of water the morning of the procedure. . Blood thinners:  . Diabetics on insulin: Notify the staff so that you can be scheduled 1st case in the morning. If your diabetes requires high dose insulin, take only  of your normal insulin dose   the morning of the procedure and notify the staff that you have done so. . Preventing infections: Shower  with an antibacterial soap the morning of your procedure.  . Build-up your immune system: Take 1000 mg of Vitamin C with every meal (3 times a day) the day prior to your procedure. . Antibiotics: Inform the staff if you have a condition or reason that requires you to take antibiotics before dental procedures. . Pregnancy: If you are pregnant, call and cancel the procedure. . Sickness: If you have a cold, fever, or any active infections, call and cancel the procedure. . Arrival: You must be in the facility at least 30 minutes prior to your scheduled procedure. . Children: Do not bring any children with you. . Dress appropriately: Bring dark clothing that you would not mind if they get stained. . Valuables: Do not bring any jewelry or valuables.  Procedure appointments are reserved for interventional treatments only. . No Prescription Refills. . No medication changes will be discussed during procedure appointments. . No disability issues will be discussed.  Remember:  Regular Business hours are:  Monday to Thursday 8:00 AM to 4:00 PM  Provider's Schedule: Avon Molock, MD:  Procedure days: Tuesday and Thursday 7:30 AM to 4:00 PM  Bilal Lateef, MD:  Procedure days: Monday and Wednesday 7:30 AM to 4:00 PM ____________________________________________________________________________________________    

## 2017-12-11 NOTE — Progress Notes (Signed)
Patient's Name: Wanda [name]  MRN: [id number]  Referring Provider: Vevelyn Francois, NP  DOB: 12/02/1945  PCP: Colon Branch, MD  DOS: 12/11/2017  Note by: Gaspar Cola, MD  Service setting: Ambulatory outpatient  Specialty: Interventional Pain Management  Patient type: Established  Location: [facility] (AMB) Pain Management Facility  Visit type: Interventional Procedure   Primary Reason for Visit: Interventional Pain Management Treatment. CC: Knee Pain (left)  Procedure:  Anesthesia, Analgesia, Anxiolysis:  Type: Therapeutic Intra-Articular Hyalgan Knee Injection #1  Region: Lateral infrapatellar Knee Region Level: Knee Joint Laterality: Left knee  Type: Local Anesthesia Indication(s): Analgesia         Local Anesthetic: Lidocaine 1-2% Route: Infiltration ([place]/IM) IV Access: Declined Sedation: Declined    Indications: 1. Osteoarthritis of knee (Bilateral)   2. Chronic knee pain (Bilateral) (L>R)    Pain Score: Pre-procedure: 3 /10 Post-procedure: 0-No pain/10  Pre-op Assessment:  [name] is a 72 y.o. (year old), female patient, seen today for interventional treatment. She  has a past surgical history that includes G3 P3 . Ms. Ocheltree has a current medication list which includes the following prescription(s): aspirin ec, atorvastatin, cyclobenzaprine, gabapentin, hydrochlorothiazide, hydrocodone-acetaminophen, hydrocodone-acetaminophen, lisinopril, sertraline, vitamin d, and hydrocodone-acetaminophen, and the following Facility-Administered Medications: lidocaine (pf), ropivacaine (pf) 2 mg/ml (0.2%), and sodium hyaluronate. Her primarily concern today is the Knee Pain (left)  Initial Vital Signs:  Pulse/HCG Rate: 70  Temp: 98.1 F (36.7 C) Resp: 16 BP: 118/78 SpO2: 100 %  BMI: Estimated body mass index is 42.91 kg/m as calculated from the following:   Height as of this encounter: 5\' 4"  (1.626 m).   Weight as of this encounter: 250 lb (113.4 kg).  Risk  Assessment: Allergies: Reviewed. She is allergic to simvastatin.  Allergy Precautions: None required Coagulopathies: Reviewed. None identified.  Blood-thinner therapy: None at this time Active Infection(s): Reviewed. None identified. Ms. Dress is afebrile  Site Confirmation: [name] was asked to confirm the procedure and laterality before marking the site Procedure checklist: Completed Consent: Before the procedure and under the influence of no sedative(s), amnesic(s), or anxiolytics, the patient was informed of the treatment options, risks and possible complications. To fulfill our ethical and legal obligations, as recommended by the American Medical Association's Code of Ethics, I have informed the patient of my clinical impression; the nature and purpose of the treatment or procedure; the risks, benefits, and possible complications of the intervention; the alternatives, including doing nothing; the risk(s) and benefit(s) of the alternative treatment(s) or procedure(s); and the risk(s) and benefit(s) of doing nothing. The patient was provided information about the general risks and possible complications associated with the procedure. These may include, but are not limited to: failure to achieve desired goals, infection, bleeding, organ or nerve damage, allergic reactions, paralysis, and death. In addition, the patient was informed of those risks and complications associated to the procedure, such as failure to decrease pain; infection; bleeding; organ or nerve damage with subsequent damage to sensory, motor, and/or autonomic systems, resulting in permanent pain, numbness, and/or weakness of one or several areas of the body; allergic reactions; (i.e.: anaphylactic reaction); and/or death. Furthermore, the patient was informed of those risks and complications associated with the medications. These include, but are not limited to: allergic reactions (i.e.: anaphylactic or anaphylactoid  reaction(s)); adrenal axis suppression; blood sugar elevation that in diabetics may result in ketoacidosis or comma; water retention that in patients with history of congestive heart failure may result in shortness of breath,  pulmonary edema, and decompensation with resultant heart failure; weight gain; swelling or edema; medication-induced neural toxicity; particulate matter embolism and blood vessel occlusion with resultant organ, and/or nervous system infarction; and/or aseptic necrosis of one or more joints. Finally, the patient was informed that Medicine is not an exact science; therefore, there is also the possibility of unforeseen or unpredictable risks and/or possible complications that may result in a catastrophic outcome. The patient indicated having understood very clearly. We have given the patient no guarantees and we have made no promises. Enough time was given to the patient to ask questions, all of which were answered to the patient's satisfaction. [name] has indicated that she wanted to continue with the procedure. Attestation: I, the ordering provider, attest that I have discussed with the patient the benefits, risks, side-effects, alternatives, likelihood of achieving goals, and potential problems during recovery for the procedure that I have provided informed consent. Date  Time: 12/11/2017 10:35 AM  Pre-Procedure Preparation:  Monitoring: As per clinic protocol. Respiration, ETCO2, SpO2, BP, heart rate and rhythm monitor placed and checked for adequate function Safety Precautions: Patient was assessed for positional comfort and pressure points before starting the procedure. Time-out: I initiated and conducted the "Time-out" before starting the procedure, as per protocol. The patient was asked to participate by confirming the accuracy of the "Time Out" information. Verification of the correct person, site, and procedure were performed and confirmed by me, the nursing staff, and the  patient. "Time-out" conducted as per Joint Commission's Universal Protocol (UP.01.01.01). Time: 1113  Description of Procedure:       Position: Sitting Target Area: Knee Joint Approach: Just above the Lateral tibial plateau, lateral to the infrapatellar tendon. Area Prepped: Entire knee area, from the mid-thigh to the mid-shin. Prepping solution: ChloraPrep (2% chlorhexidine gluconate and 70% isopropyl alcohol) Safety Precautions: Aspiration looking for blood return was conducted prior to all injections. At no point did we inject any substances, as a needle was being advanced. No attempts were made at seeking any paresthesias. Safe injection practices and needle disposal techniques used. Medications properly checked for expiration dates. SDV (single dose vial) medications used. Description of the Procedure: Protocol guidelines were followed. The patient was placed in position over the fluoroscopy table. The target area was identified and the area prepped in the usual manner. Skin desensitized using vapocoolant spray. Skin & deeper tissues infiltrated with local anesthetic. Appropriate amount of time allowed to pass for local anesthetics to take effect. The procedure needles were then advanced to the target area. Proper needle placement secured. Negative aspiration confirmed. Solution injected in intermittent fashion, asking for systemic symptoms every 0.5cc of injectate. The needles were then removed and the area cleansed, making sure to leave some of the prepping solution back to take advantage of its long term bactericidal properties. Vitals:   12/11/17 1033 12/11/17 1114  BP: 118/78 115/61  Pulse: 70 70  Resp: 16 16  Temp: 98.1 F (36.7 C) 98.4 F (36.9 C)  TempSrc: Oral   SpO2: 100% 99%  Weight: 250 lb (113.4 kg)   Height: 5\' 4"  (1.626 m)     Start Time: 1113 hrs. End Time: 1113 hrs. Materials:  Needle(s) Type: Regular needle Gauge: 22G Length: 3.5-in Medication(s): Please see  orders for medications and dosing details.  Imaging Guidance:  Type of Imaging Technique: None used Indication(s): N/A Exposure Time: No patient exposure Contrast: None used. Fluoroscopic Guidance: N/A Ultrasound Guidance: N/A Interpretation: N/A  Antibiotic Prophylaxis:   Anti-infectives (From  admission, onward)   None     Indication(s): None identified  Post-operative Assessment:  Post-procedure Vital Signs:  Pulse/HCG Rate: 70  Temp: 98.4 F (36.9 C) Resp: 16 BP: 115/61 SpO2: 99 %  EBL: None  Complications: No immediate post-treatment complications observed by team, or reported by patient.  Note: The patient tolerated the entire procedure well. A repeat set of vitals were taken after the procedure and the patient was kept under observation following institutional policy, for this type of procedure. Post-procedural neurological assessment was performed, showing return to baseline, prior to discharge. The patient was provided with post-procedure discharge instructions, including a section on how to identify potential problems. Should any problems arise concerning this procedure, the patient was given instructions to immediately contact us, at any time, without hesitation. In any case, we plan to contact the patient by telephone for a follow-up status report regarding this interventional procedure.  Comments:  No additional relevant information.  Plan of Care   Imaging Orders  No imaging studies ordered today    Procedure Orders     KNEE INJECTION     KNEE INJECTION  Medications ordered for procedure: Meds ordered this encounter  Medications  . lidocaine (PF) (XYLOCAINE) 1 % injection 4 mL  . ropivacaine (PF) 2 mg/mL (0.2%) (NAROPIN) injection 4 mL  . Sodium Hyaluronate SOSY 2 mL   Medications administered: Lillia L. Catanese had no medications administered during this visit.  See the medical record for exact dosing, route, and time of administration.  New  Prescriptions   No medications on file   Disposition: Discharge home  Discharge Date & Time: 12/11/2017; 1120 hrs.   Physician-requested Follow-up: Return for PPE (2 wks) + Procedure (no sedation): (L) Hyalgan #2.  Future Appointments  Date Time Provider [place]  12/24/2017  1:00 PM Colon Branch, MD [facility]  01/01/2018 10:15 AM Milinda Pointer, MD [facility] None  01/22/2018 10:45 AM Vevelyn Francois, NP [facility] None   Primary Care Physician: Colon Branch, MD Location: [facility] Note by: Gaspar Cola, MD Date: 12/11/2017; Time: 11:37 AM  Disclaimer:  Medicine is not an exact science. The only guarantee in medicine is that nothing is guaranteed. It is important to note that the decision to proceed with this intervention was based on the information collected from the patient. The Data and conclusions were drawn from the patient's questionnaire, the interview, and the physical examination. Because the information was provided in large part by the patient, it cannot be guaranteed that it has not been purposely or unconsciously manipulated. Every effort has been made to obtain as much relevant data as possible for this evaluation. It is important to note that the conclusions that lead to this procedure are derived in large part from the available data. Always take into account that the treatment will also be dependent on availability of resources and existing treatment guidelines, considered by other Pain Management Practitioners as being common knowledge and practice, at the time of the intervention. For Medico-Legal purposes, it is also important to point out that variation in procedural techniques and pharmacological choices are the acceptable norm. The indications, contraindications, technique, and results of the above procedure should only be interpreted and judged by a Board-Certified Interventional Pain Specialist with extensive familiarity and  expertise in the same exact procedure and technique.

## 2017-12-12 ENCOUNTER — Telehealth: Payer: Self-pay

## 2017-12-12 NOTE — Telephone Encounter (Signed)
Post procedure phone call. Patient states she is doing good.  

## 2017-12-24 ENCOUNTER — Ambulatory Visit (INDEPENDENT_AMBULATORY_CARE_PROVIDER_SITE_OTHER): Payer: Medicare HMO | Admitting: Internal Medicine

## 2017-12-24 ENCOUNTER — Encounter: Payer: Self-pay | Admitting: Internal Medicine

## 2017-12-24 VITALS — BP 126/64 | HR 74 | Temp 97.4°F | Resp 16 | Ht 64.0 in | Wt 251.5 lb

## 2017-12-24 DIAGNOSIS — R252 Cramp and spasm: Secondary | ICD-10-CM

## 2017-12-24 DIAGNOSIS — Z780 Asymptomatic menopausal state: Secondary | ICD-10-CM

## 2017-12-24 DIAGNOSIS — I10 Essential (primary) hypertension: Secondary | ICD-10-CM | POA: Diagnosis not present

## 2017-12-24 DIAGNOSIS — Z23 Encounter for immunization: Secondary | ICD-10-CM | POA: Diagnosis not present

## 2017-12-24 DIAGNOSIS — E785 Hyperlipidemia, unspecified: Secondary | ICD-10-CM

## 2017-12-24 DIAGNOSIS — F329 Major depressive disorder, single episode, unspecified: Secondary | ICD-10-CM | POA: Diagnosis not present

## 2017-12-24 DIAGNOSIS — Z0000 Encounter for general adult medical examination without abnormal findings: Secondary | ICD-10-CM

## 2017-12-24 DIAGNOSIS — E559 Vitamin D deficiency, unspecified: Secondary | ICD-10-CM | POA: Diagnosis not present

## 2017-12-24 DIAGNOSIS — F419 Anxiety disorder, unspecified: Secondary | ICD-10-CM

## 2017-12-24 LAB — CBC WITH DIFFERENTIAL/PLATELET
BASOS ABS: 0 10*3/uL (ref 0.0–0.1)
BASOS PCT: 0.7 % (ref 0.0–3.0)
EOS PCT: 2.1 % (ref 0.0–5.0)
Eosinophils Absolute: 0.1 10*3/uL (ref 0.0–0.7)
HCT: 38.9 % (ref 36.0–46.0)
Hemoglobin: 13 g/dL (ref 12.0–15.0)
LYMPHS ABS: 1.6 10*3/uL (ref 0.7–4.0)
LYMPHS PCT: 40.4 % (ref 12.0–46.0)
MCHC: 33.5 g/dL (ref 30.0–36.0)
MCV: 94.2 fl (ref 78.0–100.0)
MONO ABS: 0.3 10*3/uL (ref 0.1–1.0)
MONOS PCT: 8.3 % (ref 3.0–12.0)
NEUTROS ABS: 1.9 10*3/uL (ref 1.4–7.7)
NEUTROS PCT: 48.5 % (ref 43.0–77.0)
PLATELETS: 174 10*3/uL (ref 150.0–400.0)
RBC: 4.13 Mil/uL (ref 3.87–5.11)
RDW: 15.7 % — ABNORMAL HIGH (ref 11.5–15.5)
WBC: 3.9 10*3/uL — AB (ref 4.0–10.5)

## 2017-12-24 LAB — COMPREHENSIVE METABOLIC PANEL
ALBUMIN: 4 g/dL (ref 3.5–5.2)
ALT: 18 U/L (ref 0–35)
AST: 19 U/L (ref 0–37)
Alkaline Phosphatase: 73 U/L (ref 39–117)
BILIRUBIN TOTAL: 0.4 mg/dL (ref 0.2–1.2)
BUN: 12 mg/dL (ref 6–23)
CALCIUM: 9.5 mg/dL (ref 8.4–10.5)
CO2: 30 mEq/L (ref 19–32)
CREATININE: 0.61 mg/dL (ref 0.40–1.20)
Chloride: 102 mEq/L (ref 96–112)
GFR: 124.88 mL/min (ref >60.00)
Glucose, Bld: 92 mg/dL (ref 70–99)
Potassium: 4.4 mEq/L (ref 3.5–5.1)
SODIUM: 137 meq/L (ref 135–145)
Total Protein: 6.6 g/dL (ref 6.0–8.3)

## 2017-12-24 LAB — LIPID PANEL
CHOLESTEROL: 225 mg/dL — AB (ref 0–200)
HDL: 51.1 mg/dL (ref >39.00)
LDL Cholesterol: 163 mg/dL — ABNORMAL HIGH (ref 0–99)
NonHDL: 174.32
TRIGLYCERIDES: 58 mg/dL (ref 0.0–149.0)
Total CHOL/HDL Ratio: 4
VLDL: 11.6 mg/dL (ref 0.0–40.0)

## 2017-12-24 LAB — MAGNESIUM: MAGNESIUM: 1.8 mg/dL (ref 1.5–2.5)

## 2017-12-24 MED ORDER — SERTRALINE HCL 50 MG PO TABS: 150.0000 mg | ORAL_TABLET | Freq: Every day | ORAL | 6 refills | Status: DC

## 2017-12-24 MED ORDER — LISINOPRIL 10 MG PO TABS: 10.0000 mg | ORAL_TABLET | Freq: Every day | ORAL | 6 refills | Status: DC

## 2017-12-24 MED ORDER — HYDROCHLOROTHIAZIDE 25 MG PO TABS: 25.0000 mg | ORAL_TABLET | Freq: Every day | ORAL | 1 refills | Status: DC

## 2017-12-24 MED ORDER — ATORVASTATIN CALCIUM 10 MG PO TABS: 10.0000 mg | ORAL_TABLET | Freq: Every day | ORAL | 1 refills | Status: DC

## 2017-12-24 NOTE — Assessment & Plan Note (Signed)
HTN: Seems well controlled on lisinopril Hyperlipidemia: Has not taking Lipitor for a while, pt thought it could be causing leg cramps however leg cramps continue w/o statins thus unrelated.  Will check a FLP and recommend to go back on Lipitor. Depression, anxiety: Husband has dementia, stroke and cancer, very distressed, she continue with Zoloft and has also seek counseling and is doing better.  Refill medications. MSK: Follow-up elsewhere. Vitamin D deficiency: Recommend 1000 units daily, checking levels Cramps: Check Mg level, try tonic water at night RTC 6 months

## 2017-12-24 NOTE — Assessment & Plan Note (Signed)
-   due for a Td , provided  -Female care: last visit w/ Dr Deatra Ina 05/2017 [facility] 05/2017 PAP 03/2016 -[facility]: cscope 2014, due for arepeat cscope, already got a letter from GI, encouraged to call them -[place]  2012, results?  Recheck a bone density test -Labs: CMP, FLP, CBC, vitamin D, magnesium level -Diet and exercise discussed

## 2017-12-24 NOTE — Progress Notes (Signed)
Pre visit review using our clinic review tool, if applicable. No additional management support is needed unless otherwise documented below in the visit note. 

## 2017-12-24 NOTE — Progress Notes (Signed)
Subjective:    Patient ID: Wanda [name], female    DOB: May 11, 1946, 72 y.o.   MRN: [id number]  DOS:  12/24/2017 Type of visit - description : cpx Interval history: Has few concerns   Review of Systems Under a lot of stress lately, her husband has dementia, cancer and a stroke, he is taking care of at the [place] Lower extremity cramps, check magnesium? Continue with pain,  left knee. Patient discontinue Lipitor due to leg cramps, cramps did not improve  Other than above, a 14 point review of systems is negative      Past Medical History:  Diagnosis Date  . Allergy    RHINITIS  . Chronic back pain   . Depression   . DJD (degenerative joint disease)    back pain , on disability  . Hyperlipidemia   . Hypertension   . Menopause    onset age 67   . Musculoskeletal pain 12/07/2015  . TMJ PAIN 05/17/2010   Qualifier: Diagnosis of  By: Larose Kells MD, [place]     Past Surgical History:  Procedure Laterality Date  . G3 P3       Social History   Socioeconomic History  . Marital status: Married    Spouse name: Not on file  . Number of children: 3  . Years of education: Not on file  . Highest education level: Not on file  Occupational History  . Occupation: [employer] DIS[employer]  Social Needs  . Financial resource strain: Not on file  . Food insecurity:    Worry: Not on file    Inability: Not on file  . Transportation needs:    Medical: Not on file    Non-medical: Not on file  Tobacco Use  . Smoking status: Never Smoker  . Smokeless tobacco: Never Used  Substance and Sexual Activity  . Alcohol use: No  . Drug use: No  . Sexual activity: Not on file  Lifestyle  . Physical activity:    Days per week: Not on file    Minutes per session: Not on file  . Stress: Not on file  Relationships  . Social connections:    Talks on phone: Not on file    Gets together: Not on file    Attends religious service: Not on file    Active member of club or organization: Not on file   Attends meetings of clubs or organizations: Not on file    Relationship status: Not on file  . Intimate partner violence:    Fear of current or ex partner: Not on file    Emotionally abused: Not on file    Physically abused: Not on file    Forced sexual activity: Not on file  Other Topics Concern  . Not on file  Social History Narrative   Married, 3 kids, 6 G-kids    Household-- pt, husband, son     Family History  Problem Relation Age of Onset  . Cancer Sister        ??CERVICAL  . Cancer Brother        STOMACH  . Breast cancer Sister        sister dx age 65, aunt, cousin x 2   . Colon cancer Brother        dx age ~ 70 ?  [name] Diabetes Unknown        aunt   . Coronary artery disease Neg Hx   . CAD Neg Hx  Allergies as of 12/24/2017      Reactions   Simvastatin    REACTION: cramps      Medication List        Accurate as of 12/24/17 10:06 PM. Always use your most recent med list.          aspirin EC 81 MG tablet Take 81 mg by mouth daily.   atorvastatin 10 MG tablet Commonly known as:  LIPITOR Take 1 tablet (10 mg total) by mouth at bedtime.   cyclobenzaprine 10 MG tablet Commonly known as:  FLEXERIL Take 1 tablet (10 mg total) by mouth at bedtime.   gabapentin 300 MG capsule Commonly known as:  NEURONTIN Take 1 capsule (300 mg total) by mouth 3 (three) times daily.   hydrochlorothiazide 25 MG tablet Commonly known as:  HYDRODIURIL Take 1 tablet (25 mg total) by mouth daily.   HYDROcodone-acetaminophen 5-325 MG tablet Commonly known as:  NORCO/VICODIN Take 1 tablet by mouth every 6 (six) hours as needed for severe pain.   HYDROcodone-acetaminophen 5-325 MG tablet Commonly known as:  NORCO/VICODIN Take 1 tablet by mouth every 6 (six) hours as needed for moderate pain.   HYDROcodone-acetaminophen 5-325 MG tablet Commonly known as:  NORCO/VICODIN Take 1 tablet by mouth every 6 (six) hours as needed for severe pain. Start taking on:  01/01/2018     lisinopril 10 MG tablet Commonly known as:  PRINIVIL,ZESTRIL Take 1 tablet (10 mg total) by mouth daily.   sertraline 50 MG tablet Commonly known as:  ZOLOFT Take 3 tablets (150 mg total) by mouth daily.   Vitamin D 2000 units tablet Take 2,000 Units by mouth daily.          Objective:   Physical Exam BP 126/64 (BP Location: Left Arm, Patient Position: Sitting, Cuff Size: Normal)   Pulse 74   Temp (!) 97.4 F (36.3 C) (Oral)   Resp 16   Ht 5\' 4"  (1.626 m)   Wt 251 lb 8 oz (114.1 kg)   SpO2 97%   BMI 43.17 kg/m  General:   Well developed, morbidly obese appearing . NAD.  Neck: No  thyromegaly  HEENT:  Normocephalic . Face symmetric, atraumatic Lungs:  CTA B Normal respiratory effort, no intercostal retractions, no accessory muscle use. Heart: RRR,  no murmur.  No pretibial edema bilaterally  Abdomen:  Not distended, soft, non-tender. No rebound or rigidity.   Skin: Exposed areas without rash. Not pale. Not jaundice Neurologic:  alert & oriented X3.  Speech normal, gait appropriate for age and unassisted Strength symmetric and appropriate for age.  Psych: Cognition and judgment appear intact.  Cooperative with normal attention span and concentration.  Behavior appropriate. No anxious or depressed appearing.     Assessment & Plan:    Assessment HTN Hyperlipidemia Depression Morbid obesity MSK: DJD, chronic back-knee pain, hydrocodone /gabapentin rx by DR Dossie Arbour  PLAN: HTN: Seems well controlled on lisinopril Hyperlipidemia: Has not taking Lipitor for a while, pt thought it could be causing leg cramps however leg cramps continue w/o statins thus unrelated.  Will check a FLP and recommend to go back on Lipitor. Depression, anxiety: Husband has dementia, stroke and cancer, very distressed, she continue with Zoloft and has also seek counseling and is doing better.  Refill medications. MSK: Follow-up elsewhere. Vitamin D deficiency: Recommend 1000 units  daily, checking levels Cramps: Check Mg level, try tonic water at night RTC 6 months

## 2017-12-24 NOTE — Patient Instructions (Signed)
GO TO THE LAB : Get the blood work     GO TO THE FRONT DESK Schedule your next appointment for a checkup in 6 months   Take vitamin D 1000 units every day.  Go back on Lipitor  Consider tonic water at night to help with the cramps  Consider a Medicare wellness exam with one of our nurses

## 2017-12-27 LAB — VITAMIN D 1,25 DIHYDROXY
VITAMIN D 1, 25 (OH) TOTAL: 68 pg/mL (ref 18–72)
VITAMIN D3 1, 25 (OH): 68 pg/mL
Vitamin D2 1, 25 (OH)2: <8 pg/mL

## 2018-01-01 ENCOUNTER — Encounter: Payer: Self-pay | Admitting: Pain Medicine

## 2018-01-01 ENCOUNTER — Ambulatory Visit: Payer: Medicare HMO | Attending: Pain Medicine | Admitting: Pain Medicine

## 2018-01-01 ENCOUNTER — Other Ambulatory Visit: Payer: Self-pay

## 2018-01-01 VITALS — BP 130/78 | HR 70 | Temp 97.1°F | Resp 6 | Ht 65.0 in | Wt 251.0 lb

## 2018-01-01 DIAGNOSIS — M170 Bilateral primary osteoarthritis of knee: Secondary | ICD-10-CM | POA: Diagnosis not present

## 2018-01-01 DIAGNOSIS — M25562 Pain in left knee: Secondary | ICD-10-CM | POA: Diagnosis not present

## 2018-01-01 DIAGNOSIS — M25561 Pain in right knee: Secondary | ICD-10-CM | POA: Insufficient documentation

## 2018-01-01 DIAGNOSIS — G8929 Other chronic pain: Secondary | ICD-10-CM | POA: Insufficient documentation

## 2018-01-01 MED ORDER — ROPIVACAINE HCL 2 MG/ML IJ SOLN
INTRAMUSCULAR | Status: AC
  Filled 2018-01-01: qty 10

## 2018-01-01 MED ORDER — SODIUM HYALURONATE (VISCOSUP) 20 MG/2ML IX SOSY
2.0000 mL | PREFILLED_SYRINGE | Freq: Once | INTRA_ARTICULAR | Status: AC
  Administered 2018-01-01: 2 mL via INTRA_ARTICULAR

## 2018-01-01 MED ORDER — ROPIVACAINE HCL 2 MG/ML IJ SOLN
4.0000 mL | Freq: Once | INTRAMUSCULAR | Status: AC
  Administered 2018-01-01: 10 mL via INTRA_ARTICULAR

## 2018-01-01 MED ORDER — LIDOCAINE HCL (PF) 1 % IJ SOLN
4.0000 mL | Freq: Once | INTRAMUSCULAR | Status: AC
  Administered 2018-01-01: 5 mL

## 2018-01-01 MED ORDER — LIDOCAINE HCL (PF) 1 % IJ SOLN
INTRAMUSCULAR | Status: AC
  Filled 2018-01-01: qty 5

## 2018-01-01 NOTE — Progress Notes (Signed)
Safety precautions to be maintained throughout the outpatient stay will include: orient to surroundings, keep bed in low position, maintain call bell within reach at all times, provide assistance with transfer out of bed and ambulation.  

## 2018-01-01 NOTE — Patient Instructions (Signed)
____________________________________________________________________________________________  Post-Procedure Discharge Instructions  Instructions:  Apply ice: Fill a plastic sandwich bag with crushed ice. Cover it with a small towel and apply to injection site. Apply for 15 minutes then remove x 15 minutes. Repeat sequence on day of procedure, until you go to bed. The purpose is to minimize swelling and discomfort after procedure.  Apply heat: Apply heat to procedure site starting the day following the procedure. The purpose is to treat any soreness and discomfort from the procedure.  Food intake: Start with clear liquids (like water) and advance to regular food, as tolerated.   Physical activities: Keep activities to a minimum for the first 8 hours after the procedure.   Driving: If you have received any sedation, you are not allowed to drive for 24 hours after your procedure.  Blood thinner: Restart your blood thinner 6 hours after your procedure. (Only for those taking blood thinners)  Insulin: As soon as you can eat, you may resume your normal dosing schedule. (Only for those taking insulin)  Infection prevention: Keep procedure site clean and dry.  Post-procedure Pain Diary: Extremely important that this be done correctly and accurately. Recorded information will be used to determine the next step in treatment.  Pain evaluated is that of treated area only. Do not include pain from an untreated area.  Complete every hour, on the hour, for the initial 8 hours. Set an alarm to help you do this part accurately.  Do not go to sleep and have it completed later. It will not be accurate.  Follow-up appointment: Keep your follow-up appointment after the procedure. Usually 2 weeks for most procedures. (6 weeks in the case of radiofrequency.) Bring you pain diary.   Expect:  From numbing medicine (AKA: Local Anesthetics): Numbness or decrease in pain.  Onset: Full effect within 15  minutes of injected.  Duration: It will depend on the type of local anesthetic used. On the average, 1 to 8 hours.   From steroids: Decrease in swelling or inflammation. Once inflammation is improved, relief of the pain will follow.  Onset of benefits: Depends on the amount of swelling present. The more swelling, the longer it will take for the benefits to be seen. In some cases, up to 10 days.  Duration: Steroids will stay in the system x 2 weeks. Duration of benefits will depend on multiple posibilities including persistent irritating factors.  From procedure: Some discomfort is to be expected once the numbing medicine wears off. This should be minimal if ice and heat are applied as instructed.  Call if:  You experience numbness and weakness that gets worse with time, as opposed to wearing off.  New onset bowel or bladder incontinence. (This applies to Spinal procedures only)  Emergency Numbers:  Durning business hours (Monday - Thursday, 8:00 AM - 4:00 PM) (Friday, 9:00 AM - 12:00 Noon): [phone]  After hours: [phone] ____________________________________________________________________________________________   ____________________________________________________________________________________________  Preparing for your procedure (without sedation)  Instructions: . Oral Intake: Do not eat or drink anything for at least 3 hours prior to your procedure. . Transportation: Unless otherwise stated by your physician, you may drive yourself after the procedure. . Blood Pressure Medicine: Take your blood pressure medicine with a sip of water the morning of the procedure. . Blood thinners:  . Diabetics on insulin: Notify the staff so that you can be scheduled 1st case in the morning. If your diabetes requires high dose insulin, take only  of your normal insulin dose   the morning of the procedure and notify the staff that you have done so. . Preventing infections: Shower  with an antibacterial soap the morning of your procedure.  . Build-up your immune system: Take 1000 mg of Vitamin C with every meal (3 times a day) the day prior to your procedure. . Antibiotics: Inform the staff if you have a condition or reason that requires you to take antibiotics before dental procedures. . Pregnancy: If you are pregnant, call and cancel the procedure. . Sickness: If you have a cold, fever, or any active infections, call and cancel the procedure. . Arrival: You must be in the facility at least 30 minutes prior to your scheduled procedure. . Children: Do not bring any children with you. . Dress appropriately: Bring dark clothing that you would not mind if they get stained. . Valuables: Do not bring any jewelry or valuables.  Procedure appointments are reserved for interventional treatments only. . No Prescription Refills. . No medication changes will be discussed during procedure appointments. . No disability issues will be discussed.  Remember:  Regular Business hours are:  Monday to Thursday 8:00 AM to 4:00 PM  Provider's Schedule: Trine Fread, MD:  Procedure days: Tuesday and Thursday 7:30 AM to 4:00 PM  Bilal Lateef, MD:  Procedure days: Monday and Wednesday 7:30 AM to 4:00 PM ____________________________________________________________________________________________    

## 2018-01-01 NOTE — Progress Notes (Signed)
Patient's Name: Wanda [name]  MRN: [id number]  Referring Provider: Colon Branch, MD  DOB: 07-16-1946  PCP: Colon Branch, MD  DOS: 01/01/2018  Note by: Gaspar Cola, MD  Service setting: Ambulatory outpatient  Specialty: Interventional Pain Management  Patient type: Established  Location: [facility] (AMB) Pain Management Facility  Visit type: Interventional Procedure   Primary Reason for Visit: Interventional Pain Management Treatment. CC: Knee Pain (left)  Procedure:  Anesthesia, Analgesia, Anxiolysis:  Type: Therapeutic Intra-Articular Hyalgan Knee Injection #2  Region: Lateral infrapatellar Knee Region Level: Knee Joint Laterality: Left knee  Type: Local Anesthesia Indication(s): Analgesia         Local Anesthetic: Lidocaine 1-2% Route: Infiltration ([place]/IM) IV Access: Declined Sedation: Declined    Indications: 1. Chronic knee pain (Bilateral) (L>R)   2. Osteoarthritis of knee (Bilateral)    Pain Score: Pre-procedure: 3 /10 Post-procedure: 0-No pain/10  Pre-op Assessment:  [name] is a 72 y.o. (year old), female patient, seen today for interventional treatment. She  has a past surgical history that includes G3 P3 . [name] has a current medication list which includes the following prescription(s): aspirin ec, atorvastatin, vitamin d, cyclobenzaprine, gabapentin, hydrochlorothiazide, hydrocodone-acetaminophen, hydrocodone-acetaminophen, lisinopril, sertraline, and hydrocodone-acetaminophen. Her primarily concern today is the Knee Pain (left)  Initial Vital Signs:  Pulse/HCG Rate: 68  Temp: (!) 97.1 F (36.2 C) Resp: 16 BP: 130/82 SpO2: 99 %  BMI: Estimated body mass index is 41.77 kg/m as calculated from the following:   Height as of this encounter: 5\' 5"  (1.651 m).   Weight as of this encounter: 251 lb (113.9 kg).  Risk Assessment: Allergies: Reviewed. She is allergic to simvastatin.  Allergy Precautions: None required Coagulopathies: Reviewed. None  identified.  Blood-thinner therapy: None at this time Active Infection(s): Reviewed. None identified. [name] is afebrile  Site Confirmation: [name] was asked to confirm the procedure and laterality before marking the site Procedure checklist: Completed Consent: Before the procedure and under the influence of no sedative(s), amnesic(s), or anxiolytics, the patient was informed of the treatment options, risks and possible complications. To fulfill our ethical and legal obligations, as recommended by the American Medical Association's Code of Ethics, I have informed the patient of my clinical impression; the nature and purpose of the treatment or procedure; the risks, benefits, and possible complications of the intervention; the alternatives, including doing nothing; the risk(s) and benefit(s) of the alternative treatment(s) or procedure(s); and the risk(s) and benefit(s) of doing nothing. The patient was provided information about the general risks and possible complications associated with the procedure. These may include, but are not limited to: failure to achieve desired goals, infection, bleeding, organ or nerve damage, allergic reactions, paralysis, and death. In addition, the patient was informed of those risks and complications associated to the procedure, such as failure to decrease pain; infection; bleeding; organ or nerve damage with subsequent damage to sensory, motor, and/or autonomic systems, resulting in permanent pain, numbness, and/or weakness of one or several areas of the body; allergic reactions; (i.e.: anaphylactic reaction); and/or death. Furthermore, the patient was informed of those risks and complications associated with the medications. These include, but are not limited to: allergic reactions (i.e.: anaphylactic or anaphylactoid reaction(s)); adrenal axis suppression; blood sugar elevation that in diabetics may result in ketoacidosis or comma; water retention that in  patients with history of congestive heart failure may result in shortness of breath, pulmonary edema, and decompensation with resultant heart failure; weight gain; swelling or edema; medication-induced  neural toxicity; particulate matter embolism and blood vessel occlusion with resultant organ, and/or nervous system infarction; and/or aseptic necrosis of one or more joints. Finally, the patient was informed that Medicine is not an exact science; therefore, there is also the possibility of unforeseen or unpredictable risks and/or possible complications that may result in a catastrophic outcome. The patient indicated having understood very clearly. We have given the patient no guarantees and we have made no promises. Enough time was given to the patient to ask questions, all of which were answered to the patient's satisfaction. Ms. Screws has indicated that she wanted to continue with the procedure. Attestation: I, the ordering provider, attest that I have discussed with the patient the benefits, risks, side-effects, alternatives, likelihood of achieving goals, and potential problems during recovery for the procedure that I have provided informed consent. Date  Time: 01/01/2018 10:14 AM  Pre-Procedure Preparation:  Monitoring: As per clinic protocol. Respiration, ETCO2, SpO2, BP, heart rate and rhythm monitor placed and checked for adequate function Safety Precautions: Patient was assessed for positional comfort and pressure points before starting the procedure. Time-out: I initiated and conducted the "Time-out" before starting the procedure, as per protocol. The patient was asked to participate by confirming the accuracy of the "Time Out" information. Verification of the correct person, site, and procedure were performed and confirmed by me, the nursing staff, and the patient. "Time-out" conducted as per Joint Commission's Universal Protocol (UP.01.01.01). Time: 1052  Description of Procedure:        Position: Sitting Target Area: Knee Joint Approach: Just above the Lateral tibial plateau, lateral to the infrapatellar tendon. Area Prepped: Entire knee area, from the mid-thigh to the mid-shin. Prepping solution: ChloraPrep (2% chlorhexidine gluconate and 70% isopropyl alcohol) Safety Precautions: Aspiration looking for blood return was conducted prior to all injections. At no point did we inject any substances, as a needle was being advanced. No attempts were made at seeking any paresthesias. Safe injection practices and needle disposal techniques used. Medications properly checked for expiration dates. SDV (single dose vial) medications used. Description of the Procedure: Protocol guidelines were followed. The patient was placed in position over the fluoroscopy table. The target area was identified and the area prepped in the usual manner. Skin desensitized using vapocoolant spray. Skin & deeper tissues infiltrated with local anesthetic. Appropriate amount of time allowed to pass for local anesthetics to take effect. The procedure needles were then advanced to the target area. Proper needle placement secured. Negative aspiration confirmed. Solution injected in intermittent fashion, asking for systemic symptoms every 0.5cc of injectate. The needles were then removed and the area cleansed, making sure to leave some of the prepping solution back to take advantage of its long term bactericidal properties. Vitals:   01/01/18 1012 01/01/18 1054  BP: 130/82 130/78  Pulse: 68 70  Resp: 16 (!) 6  Temp: (!) 97.1 F (36.2 C)   TempSrc: Oral   SpO2: 99% 100%  Weight: 251 lb (113.9 kg)   Height: 5\' 5"  (1.651 m)     Start Time: 1052 hrs. End Time: 1053 hrs. Materials:  Needle(s) Type: Regular needle Gauge: 25G Length: 1.5-in Medication(s): Please see orders for medications and dosing details.  Imaging Guidance:  Type of Imaging Technique: None used Indication(s): N/A Exposure Time: No patient  exposure Contrast: None used. Fluoroscopic Guidance: N/A Ultrasound Guidance: N/A Interpretation: N/A  Antibiotic Prophylaxis:   Anti-infectives (From admission, onward)   None     Indication(s): None identified  Post-operative Assessment:  Post-procedure Vital Signs:  Pulse/HCG Rate: 70  Temp: (!) 97.1 F (36.2 C) Resp: (!) 6 BP: 130/78 SpO2: 100 %  EBL: None  Complications: No immediate post-treatment complications observed by team, or reported by patient.  Note: The patient tolerated the entire procedure well. A repeat set of vitals were taken after the procedure and the patient was kept under observation following institutional policy, for this type of procedure. Post-procedural neurological assessment was performed, showing return to baseline, prior to discharge. The patient was provided with post-procedure discharge instructions, including a section on how to identify potential problems. Should any problems arise concerning this procedure, the patient was given instructions to immediately contact us, at any time, without hesitation. In any case, we plan to contact the patient by telephone for a follow-up status report regarding this interventional procedure.  Comments:  No additional relevant information.  Plan of Care   Imaging Orders  No imaging studies ordered today    Procedure Orders     KNEE INJECTION     KNEE INJECTION     KNEE INJECTION  Medications ordered for procedure: Meds ordered this encounter  Medications  . lidocaine (PF) (XYLOCAINE) 1 % injection 4 mL  . ropivacaine (PF) 2 mg/mL (0.2%) (NAROPIN) injection 4 mL  . Sodium Hyaluronate SOSY 2 mL   Medications administered: We administered lidocaine (PF), ropivacaine (PF) 2 mg/mL (0.2%), and Sodium Hyaluronate.  See the medical record for exact dosing, route, and time of administration.  New Prescriptions   No medications on file   Disposition: Discharge home  Discharge Date & Time:  01/01/2018; 1055 hrs.   Physician-requested Follow-up: Return for PPE (2 wks) + Procedure (no sedation): (L) Hyalgan #3.  Future Appointments  Date Time Provider [place]  01/15/2018 10:15 AM Milinda Pointer, MD [facility] None  01/22/2018 10:45 AM Vevelyn Francois, NP [facility] None   Primary Care Physician: Colon Branch, MD Location: [facility] Note by: Gaspar Cola, MD Date: 01/01/2018; Time: 11:32 AM  Disclaimer:  Medicine is not an exact science. The only guarantee in medicine is that nothing is guaranteed. It is important to note that the decision to proceed with this intervention was based on the information collected from the patient. The Data and conclusions were drawn from the patient's questionnaire, the interview, and the physical examination. Because the information was provided in large part by the patient, it cannot be guaranteed that it has not been purposely or unconsciously manipulated. Every effort has been made to obtain as much relevant data as possible for this evaluation. It is important to note that the conclusions that lead to this procedure are derived in large part from the available data. Always take into account that the treatment will also be dependent on availability of resources and existing treatment guidelines, considered by other Pain Management Practitioners as being common knowledge and practice, at the time of the intervention. For Medico-Legal purposes, it is also important to point out that variation in procedural techniques and pharmacological choices are the acceptable norm. The indications, contraindications, technique, and results of the above procedure should only be interpreted and judged by a Board-Certified Interventional Pain Specialist with extensive familiarity and expertise in the same exact procedure and technique.

## 2018-01-02 ENCOUNTER — Telehealth: Payer: Self-pay

## 2018-01-02 NOTE — Telephone Encounter (Signed)
Post procedure phone call.  Patient states shye is doing good.

## 2018-01-15 ENCOUNTER — Encounter: Payer: Self-pay | Admitting: Pain Medicine

## 2018-01-15 ENCOUNTER — Ambulatory Visit: Payer: Medicare HMO | Attending: Pain Medicine | Admitting: Pain Medicine

## 2018-01-15 ENCOUNTER — Other Ambulatory Visit: Payer: Self-pay

## 2018-01-15 VITALS — BP 142/79 | HR 66 | Temp 98.6°F | Resp 18 | Ht 64.0 in | Wt 251.0 lb

## 2018-01-15 DIAGNOSIS — M170 Bilateral primary osteoarthritis of knee: Secondary | ICD-10-CM | POA: Diagnosis not present

## 2018-01-15 DIAGNOSIS — Z888 Allergy status to other drugs, medicaments and biological substances status: Secondary | ICD-10-CM | POA: Diagnosis not present

## 2018-01-15 DIAGNOSIS — M25561 Pain in right knee: Secondary | ICD-10-CM | POA: Diagnosis not present

## 2018-01-15 DIAGNOSIS — M25562 Pain in left knee: Secondary | ICD-10-CM | POA: Diagnosis not present

## 2018-01-15 DIAGNOSIS — G8929 Other chronic pain: Secondary | ICD-10-CM | POA: Insufficient documentation

## 2018-01-15 MED ORDER — SODIUM HYALURONATE (VISCOSUP) 20 MG/2ML IX SOSY
2.0000 mL | PREFILLED_SYRINGE | Freq: Once | INTRA_ARTICULAR | Status: AC
  Administered 2018-01-15: 2 mL via INTRA_ARTICULAR

## 2018-01-15 MED ORDER — LIDOCAINE HCL (PF) 1 % IJ SOLN
INTRAMUSCULAR | Status: AC
  Filled 2018-01-15: qty 5

## 2018-01-15 MED ORDER — LIDOCAINE HCL (PF) 1 % IJ SOLN
4.0000 mL | Freq: Once | INTRAMUSCULAR | Status: AC
  Administered 2018-01-15: 5 mL

## 2018-01-15 MED ORDER — ROPIVACAINE HCL 2 MG/ML IJ SOLN
4.0000 mL | Freq: Once | INTRAMUSCULAR | Status: AC
  Administered 2018-01-15: 10 mL via INTRA_ARTICULAR

## 2018-01-15 MED ORDER — ROPIVACAINE HCL 2 MG/ML IJ SOLN
INTRAMUSCULAR | Status: AC
  Filled 2018-01-15: qty 10

## 2018-01-15 NOTE — Patient Instructions (Signed)
____________________________________________________________________________________________  Post-Procedure Discharge Instructions  Instructions:  Apply ice: Fill a plastic sandwich bag with crushed ice. Cover it with a small towel and apply to injection site. Apply for 15 minutes then remove x 15 minutes. Repeat sequence on day of procedure, until you go to bed. The purpose is to minimize swelling and discomfort after procedure.  Apply heat: Apply heat to procedure site starting the day following the procedure. The purpose is to treat any soreness and discomfort from the procedure.  Food intake: Start with clear liquids (like water) and advance to regular food, as tolerated.   Physical activities: Keep activities to a minimum for the first 8 hours after the procedure.   Driving: If you have received any sedation, you are not allowed to drive for 24 hours after your procedure.  Blood thinner: Restart your blood thinner 6 hours after your procedure. (Only for those taking blood thinners)  Insulin: As soon as you can eat, you may resume your normal dosing schedule. (Only for those taking insulin)  Infection prevention: Keep procedure site clean and dry.  Post-procedure Pain Diary: Extremely important that this be done correctly and accurately. Recorded information will be used to determine the next step in treatment.  Pain evaluated is that of treated area only. Do not include pain from an untreated area.  Complete every hour, on the hour, for the initial 8 hours. Set an alarm to help you do this part accurately.  Do not go to sleep and have it completed later. It will not be accurate.  Follow-up appointment: Keep your follow-up appointment after the procedure. Usually 2 weeks for most procedures. (6 weeks in the case of radiofrequency.) Bring you pain diary.   Expect:  From numbing medicine (AKA: Local Anesthetics): Numbness or decrease in pain.  Onset: Full effect within 15  minutes of injected.  Duration: It will depend on the type of local anesthetic used. On the average, 1 to 8 hours.   From steroids: Decrease in swelling or inflammation. Once inflammation is improved, relief of the pain will follow.  Onset of benefits: Depends on the amount of swelling present. The more swelling, the longer it will take for the benefits to be seen. In some cases, up to 10 days.  Duration: Steroids will stay in the system x 2 weeks. Duration of benefits will depend on multiple posibilities including persistent irritating factors.  From procedure: Some discomfort is to be expected once the numbing medicine wears off. This should be minimal if ice and heat are applied as instructed.  Call if:  You experience numbness and weakness that gets worse with time, as opposed to wearing off.  New onset bowel or bladder incontinence. (This applies to Spinal procedures only)  Emergency Numbers:  Durning business hours (Monday - Thursday, 8:00 AM - 4:00 PM) (Friday, 9:00 AM - 12:00 Noon): [phone]  After hours: [phone] ____________________________________________________________________________________________   ____________________________________________________________________________________________  Preparing for your procedure (without sedation)  Instructions: . Oral Intake: Do not eat or drink anything for at least 3 hours prior to your procedure. . Transportation: Unless otherwise stated by your physician, you may drive yourself after the procedure. . Blood Pressure Medicine: Take your blood pressure medicine with a sip of water the morning of the procedure. . Blood thinners:  . Diabetics on insulin: Notify the staff so that you can be scheduled 1st case in the morning. If your diabetes requires high dose insulin, take only  of your normal insulin dose   the morning of the procedure and notify the staff that you have done so. . Preventing infections: Shower  with an antibacterial soap the morning of your procedure.  . Build-up your immune system: Take 1000 mg of Vitamin C with every meal (3 times a day) the day prior to your procedure. . Antibiotics: Inform the staff if you have a condition or reason that requires you to take antibiotics before dental procedures. . Pregnancy: If you are pregnant, call and cancel the procedure. . Sickness: If you have a cold, fever, or any active infections, call and cancel the procedure. . Arrival: You must be in the facility at least 30 minutes prior to your scheduled procedure. . Children: Do not bring any children with you. . Dress appropriately: Bring dark clothing that you would not mind if they get stained. . Valuables: Do not bring any jewelry or valuables.  Procedure appointments are reserved for interventional treatments only. . No Prescription Refills. . No medication changes will be discussed during procedure appointments. . No disability issues will be discussed.  Remember:  Regular Business hours are:  Monday to Thursday 8:00 AM to 4:00 PM  Provider's Schedule: Wyett Narine, MD:  Procedure days: Tuesday and Thursday 7:30 AM to 4:00 PM  Bilal Lateef, MD:  Procedure days: Monday and Wednesday 7:30 AM to 4:00 PM ____________________________________________________________________________________________    

## 2018-01-15 NOTE — Progress Notes (Signed)
Patient's Name: Wanda [name]  MRN: [id number]  Referring Provider: Colon Branch, MD  DOB: 11-29-1945  PCP: Colon Branch, MD  DOS: 01/15/2018  Note by: Gaspar Cola, MD  Service setting: Ambulatory outpatient  Specialty: Interventional Pain Management  Patient type: Established  Location: [facility] (AMB) Pain Management Facility  Visit type: Interventional Procedure   Primary Reason for Visit: Interventional Pain Management Treatment. CC: Knee Pain (left)  Procedure:          Anesthesia, Analgesia, Anxiolysis:  Type: Therapeutic Intra-Articular Hyalgan Knee Injection #3  Region: Lateral infrapatellar Knee Region Level: Knee Joint Laterality: Left knee  Type: Local Anesthesia Indication(s): Analgesia         Local Anesthetic: Lidocaine 1-2% Route: Infiltration ([place]/IM) IV Access: Declined Sedation: Declined    Indications: 1. Osteoarthritis of knee (Bilateral)   2. Chronic knee pain (Bilateral) (L>R)    Pain Score: Pre-procedure: 3 /10 Post-procedure: 0-No pain/10  Pre-op Assessment:  Wanda [name] is a 72 y.o. (year old), female patient, seen today for interventional treatment. She  has a past surgical history that includes G3 P3 . Wanda [name] has a current medication list which includes the following prescription(s): aspirin ec, atorvastatin, vitamin d, cyclobenzaprine, gabapentin, hydrochlorothiazide, hydrocodone-acetaminophen, lisinopril, sertraline, hydrocodone-acetaminophen, and hydrocodone-acetaminophen. Her primarily concern today is the Knee Pain (left)  Initial Vital Signs:  Pulse/HCG Rate: 71  Temp: 98.6 F (37 C) Resp: 18 BP: 126/84 SpO2: 100 %  BMI: Estimated body mass index is 43.08 kg/m as calculated from the following:   Height as of this encounter: 5\' 4"  (1.626 m).   Weight as of this encounter: 251 lb (113.9 kg).  Risk Assessment: Allergies: Reviewed. She is allergic to simvastatin.  Allergy Precautions: None required Coagulopathies: Reviewed. None  identified.  Blood-thinner therapy: None at this time Active Infection(s): Reviewed. None identified. Wanda [name] is afebrile  Site Confirmation: Wanda [name] was asked to confirm the procedure and laterality before marking the site Procedure checklist: Completed Consent: Before the procedure and under the influence of no sedative(s), amnesic(s), or anxiolytics, the patient was informed of the treatment options, risks and possible complications. To fulfill our ethical and legal obligations, as recommended by the American Medical Association's Code of Ethics, I have informed the patient of my clinical impression; the nature and purpose of the treatment or procedure; the risks, benefits, and possible complications of the intervention; the alternatives, including doing nothing; the risk(s) and benefit(s) of the alternative treatment(s) or procedure(s); and the risk(s) and benefit(s) of doing nothing. The patient was provided information about the general risks and possible complications associated with the procedure. These may include, but are not limited to: failure to achieve desired goals, infection, bleeding, organ or nerve damage, allergic reactions, paralysis, and death. In addition, the patient was informed of those risks and complications associated to the procedure, such as failure to decrease pain; infection; bleeding; organ or nerve damage with subsequent damage to sensory, motor, and/or autonomic systems, resulting in permanent pain, numbness, and/or weakness of one or several areas of the body; allergic reactions; (i.e.: anaphylactic reaction); and/or death. Furthermore, the patient was informed of those risks and complications associated with the medications. These include, but are not limited to: allergic reactions (i.e.: anaphylactic or anaphylactoid reaction(s)); adrenal axis suppression; blood sugar elevation that in diabetics may result in ketoacidosis or comma; water retention that in  patients with history of congestive heart failure may result in shortness of breath, pulmonary edema, and decompensation with resultant heart  failure; weight gain; swelling or edema; medication-induced neural toxicity; particulate matter embolism and blood vessel occlusion with resultant organ, and/or nervous system infarction; and/or aseptic necrosis of one or more joints. Finally, the patient was informed that Medicine is not an exact science; therefore, there is also the possibility of unforeseen or unpredictable risks and/or possible complications that may result in a catastrophic outcome. The patient indicated having understood very clearly. We have given the patient no guarantees and we have made no promises. Enough time was given to the patient to ask questions, all of which were answered to the patient's satisfaction. Wanda [name] has indicated that she wanted to continue with the procedure. Attestation: I, the ordering provider, attest that I have discussed with the patient the benefits, risks, side-effects, alternatives, likelihood of achieving goals, and potential problems during recovery for the procedure that I have provided informed consent. Date  Time: 01/15/2018 10:19 AM  Pre-Procedure Preparation:  Monitoring: As per clinic protocol. Respiration, ETCO2, SpO2, BP, heart rate and rhythm monitor placed and checked for adequate function Safety Precautions: Patient was assessed for positional comfort and pressure points before starting the procedure. Time-out: I initiated and conducted the "Time-out" before starting the procedure, as per protocol. The patient was asked to participate by confirming the accuracy of the "Time Out" information. Verification of the correct person, site, and procedure were performed and confirmed by me, the nursing staff, and the patient. "Time-out" conducted as per Joint Commission's Universal Protocol (UP.01.01.01). Time: 1052  Description of Procedure:           Position: Sitting Target Area: Knee Joint Approach: Just above the Lateral tibial plateau, lateral to the infrapatellar tendon. Area Prepped: Entire knee area, from the mid-thigh to the mid-shin. Prepping solution: ChloraPrep (2% chlorhexidine gluconate and 70% isopropyl alcohol) Safety Precautions: Aspiration looking for blood return was conducted prior to all injections. At no point did we inject any substances, as a needle was being advanced. No attempts were made at seeking any paresthesias. Safe injection practices and needle disposal techniques used. Medications properly checked for expiration dates. SDV (single dose vial) medications used. Description of the Procedure: Protocol guidelines were followed. The patient was placed in position over the fluoroscopy table. The target area was identified and the area prepped in the usual manner. Skin & deeper tissues infiltrated with local anesthetic. Appropriate amount of time allowed to pass for local anesthetics to take effect. The procedure needles were then advanced to the target area. Proper needle placement secured. Negative aspiration confirmed. Solution injected in intermittent fashion, asking for systemic symptoms every 0.5cc of injectate. The needles were then removed and the area cleansed, making sure to leave some of the prepping solution back to take advantage of its long term bactericidal properties. Vitals:   01/15/18 1018 01/15/18 1055  BP: 126/84 (!) 142/79  Pulse: 71 66  Resp: 18 18  Temp: 98.6 F (37 C)   TempSrc: Oral   SpO2: 100% 100%  Weight: 251 lb (113.9 kg)   Height: 5\' 4"  (1.626 m)     Start Time: 1052 hrs. End Time: 1054 hrs. Materials:  Needle(s) Type: Regular needle Gauge: 22G Length: 3.5-in Medication(s): Please see orders for medications and dosing details.  Imaging Guidance:          Type of Imaging Technique: None used Indication(s): N/A Exposure Time: No patient exposure Contrast: None  used. Fluoroscopic Guidance: N/A Ultrasound Guidance: N/A Interpretation: N/A  Antibiotic Prophylaxis:   Anti-infectives (From admission, onward)  None     Indication(s): None identified  Post-operative Assessment:  Post-procedure Vital Signs:  Pulse/HCG Rate: 66  Temp: 98.6 F (37 C) Resp: 18 BP: (!) 142/79 SpO2: 100 %  EBL: None  Complications: No immediate post-treatment complications observed by team, or reported by patient.  Note: The patient tolerated the entire procedure well. A repeat set of vitals were taken after the procedure and the patient was kept under observation following institutional policy, for this type of procedure. Post-procedural neurological assessment was performed, showing return to baseline, prior to discharge. The patient was provided with post-procedure discharge instructions, including a section on how to identify potential problems. Should any problems arise concerning this procedure, the patient was given instructions to immediately contact us, at any time, without hesitation. In any case, we plan to contact the patient by telephone for a follow-up status report regarding this interventional procedure.  Comments:  No additional relevant information.  Plan of Care   Imaging Orders  No imaging studies ordered today    Procedure Orders     KNEE INJECTION     KNEE INJECTION  Medications ordered for procedure: Meds ordered this encounter  Medications  . lidocaine (PF) (XYLOCAINE) 1 % injection 4 mL  . ropivacaine (PF) 2 mg/mL (0.2%) (NAROPIN) injection 4 mL  . Sodium Hyaluronate SOSY 2 mL   Medications administered: We administered lidocaine (PF), ropivacaine (PF) 2 mg/mL (0.2%), and Sodium Hyaluronate.  See the medical record for exact dosing, route, and time of administration.  New Prescriptions   No medications on file   Disposition: Discharge home  Discharge Date & Time: 01/15/2018; 1100 hrs.   Physician-requested Follow-up:  Return for PPE (2 wks) + Procedure (no sedation): (L) Hyalgan #4.  Future Appointments  Date Time Provider [place]  01/22/2018 10:45 AM Vevelyn Francois, NP [facility] None  02/05/2018 10:15 AM Milinda Pointer, MD [facility] None   Primary Care Physician: Colon Branch, MD Location: [facility] Note by: Gaspar Cola, MD Date: 01/15/2018; Time: 11:03 AM  Disclaimer:  Medicine is not an Chief Strategy Officer. The only guarantee in medicine is that nothing is guaranteed. It is important to note that the decision to proceed with this intervention was based on the information collected from the patient. The Data and conclusions were drawn from the patient's questionnaire, the interview, and the physical examination. Because the information was provided in large part by the patient, it cannot be guaranteed that it has not been purposely or unconsciously manipulated. Every effort has been made to obtain as much relevant data as possible for this evaluation. It is important to note that the conclusions that lead to this procedure are derived in large part from the available data. Always take into account that the treatment will also be dependent on availability of resources and existing treatment guidelines, considered by other Pain Management Practitioners as being common knowledge and practice, at the time of the intervention. For Medico-Legal purposes, it is also important to point out that variation in procedural techniques and pharmacological choices are the acceptable norm. The indications, contraindications, technique, and results of the above procedure should only be interpreted and judged by a Board-Certified Interventional Pain Specialist with extensive familiarity and expertise in the same exact procedure and technique.

## 2018-01-15 NOTE — Progress Notes (Signed)
Safety precautions to be maintained throughout the outpatient stay will include: orient to surroundings, keep bed in low position, maintain call bell within reach at all times, provide assistance with transfer out of bed and ambulation.  

## 2018-01-16 ENCOUNTER — Telehealth: Payer: Self-pay

## 2018-01-16 NOTE — Telephone Encounter (Signed)
No problems post procedure. 

## 2018-01-22 ENCOUNTER — Encounter: Payer: Self-pay | Admitting: Nurse Practitioner

## 2018-01-22 ENCOUNTER — Ambulatory Visit: Payer: Medicare HMO | Attending: Nurse Practitioner | Admitting: Nurse Practitioner

## 2018-01-22 ENCOUNTER — Other Ambulatory Visit: Payer: Self-pay

## 2018-01-22 VITALS — BP 138/89 | HR 70 | Temp 98.2°F | Ht 64.0 in | Wt 251.0 lb

## 2018-01-22 DIAGNOSIS — M160 Bilateral primary osteoarthritis of hip: Secondary | ICD-10-CM | POA: Diagnosis not present

## 2018-01-22 DIAGNOSIS — G8929 Other chronic pain: Secondary | ICD-10-CM | POA: Diagnosis not present

## 2018-01-22 DIAGNOSIS — M792 Neuralgia and neuritis, unspecified: Secondary | ICD-10-CM

## 2018-01-22 DIAGNOSIS — M545 Low back pain: Secondary | ICD-10-CM | POA: Diagnosis not present

## 2018-01-22 DIAGNOSIS — Z79899 Other long term (current) drug therapy: Secondary | ICD-10-CM | POA: Diagnosis not present

## 2018-01-22 DIAGNOSIS — M25561 Pain in right knee: Secondary | ICD-10-CM | POA: Diagnosis not present

## 2018-01-22 DIAGNOSIS — G894 Chronic pain syndrome: Secondary | ICD-10-CM

## 2018-01-22 DIAGNOSIS — E785 Hyperlipidemia, unspecified: Secondary | ICD-10-CM | POA: Insufficient documentation

## 2018-01-22 DIAGNOSIS — G629 Polyneuropathy, unspecified: Secondary | ICD-10-CM | POA: Diagnosis not present

## 2018-01-22 DIAGNOSIS — M47816 Spondylosis without myelopathy or radiculopathy, lumbar region: Secondary | ICD-10-CM | POA: Diagnosis not present

## 2018-01-22 DIAGNOSIS — I10 Essential (primary) hypertension: Secondary | ICD-10-CM | POA: Insufficient documentation

## 2018-01-22 DIAGNOSIS — F418 Other specified anxiety disorders: Secondary | ICD-10-CM | POA: Diagnosis not present

## 2018-01-22 DIAGNOSIS — M797 Fibromyalgia: Secondary | ICD-10-CM | POA: Diagnosis not present

## 2018-01-22 DIAGNOSIS — Z5181 Encounter for therapeutic drug level monitoring: Secondary | ICD-10-CM | POA: Diagnosis not present

## 2018-01-22 DIAGNOSIS — Z7982 Long term (current) use of aspirin: Secondary | ICD-10-CM | POA: Diagnosis not present

## 2018-01-22 DIAGNOSIS — Z6841 Body Mass Index (BMI) 40.0 and over, adult: Secondary | ICD-10-CM | POA: Diagnosis not present

## 2018-01-22 DIAGNOSIS — M47896 Other spondylosis, lumbar region: Secondary | ICD-10-CM | POA: Diagnosis not present

## 2018-01-22 DIAGNOSIS — M25562 Pain in left knee: Secondary | ICD-10-CM | POA: Diagnosis not present

## 2018-01-22 DIAGNOSIS — Z79891 Long term (current) use of opiate analgesic: Secondary | ICD-10-CM | POA: Diagnosis not present

## 2018-01-22 DIAGNOSIS — M170 Bilateral primary osteoarthritis of knee: Secondary | ICD-10-CM | POA: Diagnosis not present

## 2018-01-22 DIAGNOSIS — E559 Vitamin D deficiency, unspecified: Secondary | ICD-10-CM | POA: Diagnosis not present

## 2018-01-22 MED ORDER — HYDROCODONE-ACETAMINOPHEN 5-325 MG PO TABS: 1.0000 | ORAL_TABLET | Freq: Four times a day (QID) | ORAL | 0 refills | Status: DC | PRN

## 2018-01-22 MED ORDER — GABAPENTIN 300 MG PO CAPS: 300.0000 mg | ORAL_CAPSULE | Freq: Three times a day (TID) | ORAL | 2 refills | Status: DC

## 2018-01-22 NOTE — Progress Notes (Signed)
Patient's Name: Wanda [name]  MRN: [id number]  Referring Provider: Colon Branch, MD  DOB: April 02, 1946  PCP: Colon Branch, MD  DOS: 01/22/2018  Note by: Vevelyn Francois NP  Service setting: Ambulatory outpatient  Specialty: Interventional Pain Management  Location: [facility] (AMB) Pain Management Facility    Patient type: Established    Primary Reason(s) for Visit: Encounter for prescription drug management. (Level of risk: moderate)  CC: Back Pain (lower)  HPI  Wanda [name] is a 72 y.o. year old, female patient, who comes today for a medication management evaluation. She has Vitamin D deficiency; Dyslipidemia; Depression with anxiety; Essential hypertension; ALLERGIC RHINITIS; CLIMACTERIC STATE, FEMALE; Leg cramps; Chronic pain syndrome; Lumbar spondylosis; Myofascial pain; Fibromyalgia; Osteoarthrosis; Encounter for therapeutic drug level monitoring; Annual physical exam; Herpes zoster; Osteoarthritis of hips (Bilateral); Morbid obesity with body mass index (BMI) of 40.0 to 44.9 in adult [facility]); PCP NOTES >>>>>>; Chronic knee pain (Bilateral) (L>R); and Osteoarthritis of knee (Bilateral) on their problem list. Her primarily concern today is the Back Pain (lower)  Pain Assessment: Location: Lower Back Radiating: Denies Onset: More than a month ago Duration: Chronic pain Quality: Aching, Burning Severity: 2 /10 (subjective, self-reported pain score)  Note: Reported level is compatible with observation.                          Effect on ADL: prolonged standing and walking,  Timing: Constant Modifying factors: Rest and medications BP: 138/89  HR: 70  Wanda [name] was last scheduled for an appointment on 11/21/2017 for medication management. During today's appointment we reviewed Wanda [name]'s chronic pain status, as well as her outpatient medication regimen.  The patient  reports that she does not use drugs. Her body mass index is 43.08 kg/m.  Further details on both, my assessment(s), as well as  the proposed treatment plan, please see below.  Controlled Substance Pharmacotherapy Assessment REMS (Risk Evaluation and Mitigation Strategy)  Analgesic:Hydrocodone/APAP 5/325 one every 6 hours (20 mg/day of hydrocodone) MME/day:20 mg/day   Wanda Fischer, RN  01/22/2018 10:24 AM  Sign at close encounter Nursing Pain Medication Assessment:  Safety precautions to be maintained throughout the outpatient stay will include: orient to surroundings, keep bed in low position, maintain call bell within reach at all times, provide assistance with transfer out of bed and ambulation.  Medication Inspection Compliance: Pill count conducted under aseptic conditions, in front of the patient. Neither the pills nor the bottle was removed from the patient's sight at any time. Once count was completed pills were immediately returned to the patient in their original bottle.  Medication: Oxycodone/APAP Pill/Patch Count: 29 of 120 pills remain Pill/Patch Appearance: Markings consistent with prescribed medication Bottle Appearance: Standard pharmacy container. Clearly labeled. Filled Date: 6 / 62 / 2019 Last Medication intake:  Today   Pharmacokinetics: Liberation and absorption (onset of action): WNL Distribution (time to peak effect): WNL Metabolism and excretion (duration of action): WNL         Pharmacodynamics: Desired effects: Analgesia: Wanda [name] reports >50% benefit. Functional ability: Patient reports that medication allows her to accomplish basic ADLs Clinically meaningful improvement in function (CMIF): Sustained CMIF goals met Perceived effectiveness: Described as relatively effective, allowing for increase in activities of daily living (ADL) Undesirable effects: Side-effects or Adverse reactions: None reported Monitoring: Wanda [name] PMP: Online review of the past 36-monthperiod conducted. Compliant with practice rules and regulations Last UDS on record: Summary  Date Value Ref  Range Status   07/10/2017 FINAL  Final    Comment:    ==================================================================== TOXASSURE SELECT 13 (MW) ==================================================================== Test                             Result       Flag       Units Drug Present and Declared for Prescription Verification   Hydrocodone                    195          EXPECTED   ng/mg creat   Hydromorphone                  103          EXPECTED   ng/mg creat   Norhydrocodone                 430          EXPECTED   ng/mg creat    Sources of hydrocodone include scheduled prescription    medications. Hydromorphone and norhydrocodone are expected    metabolites of hydrocodone. Hydromorphone is also available as a    scheduled prescription medication. ==================================================================== Test                      Result    Flag   Units      Ref Range   Creatinine              129              mg/dL      >=20 ==================================================================== Declared Medications:  The flagging and interpretation on this report are based on the  following declared medications.  Unexpected results may arise from  inaccuracies in the declared medications.  **Note: The testing scope of this panel includes these medications:  Hydrocodone (Hydrocodone-Acetaminophen)  **Note: The testing scope of this panel does not include following  reported medications:  Acetaminophen (Hydrocodone-Acetaminophen)  Aspirin (Aspirin 81)  Atorvastatin  Cholecalciferol  Cyclobenzaprine  Gabapentin  Hydrochlorothiazide  Lisinopril  Magnesium  Sertraline ==================================================================== For clinical consultation, please call [phone]. ====================================================================    UDS interpretation: Compliant          Medication Assessment Form: Reviewed. Patient indicates being compliant with  therapy Treatment compliance: Compliant Risk Assessment Profile: Aberrant behavior: See prior evaluations. None observed or detected today Comorbid factors increasing risk of overdose: See prior notes. No additional risks detected today Risk of substance use disorder (SUD): Low  ORT Scoring interpretation table:  Score <3 = Low Risk for SUD  Score between 4-7 = Moderate Risk for SUD  Score >8 = High Risk for Opioid Abuse   Risk Mitigation Strategies:  Patient Counseling: Covered Patient-Prescriber Agreement (PPA): Present and active  Notification to other healthcare providers: Done  Pharmacologic Plan: No change in therapy, at this time.             Laboratory Chemistry  Inflammation Markers (CRP: Acute Phase) (ESR: Chronic Phase) Lab Results  Component Value Date   CRP <0.5 08/18/2015   ESRSEDRATE 28 08/18/2015                         Rheumatology Markers No results found for: RF, ANA, [place], [place], [place], [place], HLAB27  Renal Function Markers Lab Results  Component Value Date   BUN 12 12/24/2017   CREATININE 0.61 12/24/2017   GFRAA >60 08/18/2015   GFRNONAA >60 08/18/2015                             Hepatic Function Markers Lab Results  Component Value Date   AST 19 12/24/2017   ALT 18 12/24/2017   ALBUMIN 4.0 12/24/2017   ALKPHOS 73 12/24/2017   HCVAB NEGATIVE 08/30/2016                        Electrolytes Lab Results  Component Value Date   NA 137 12/24/2017   K 4.4 12/24/2017   CL 102 12/24/2017   CALCIUM 9.5 12/24/2017   MG 1.8 12/24/2017                        Neuropathy Markers No results found for: Archie Balboa, HGBA1C, HIV                      Bone Pathology Markers Lab Results  Component Value Date   VD25OH 37 05/01/2011   VD125OH2TOT 68 12/24/2017   [id number] 68 12/24/2017   [id number] <8 12/24/2017                         Coagulation Parameters Lab Results  Component Value Date   PLT 174.0  12/24/2017                        Cardiovascular Markers Lab Results  Component Value Date   HGB 13.0 12/24/2017   HCT 38.9 12/24/2017                         CA Markers No results found for: CEA, CA125, LABCA2                      Note: Lab results reviewed.  Recent Diagnostic Imaging Results  MM DIAG BREAST TOMO BILATERAL CLINICAL DATA:  Annual examination of both breasts and follow-up of probably benign calcifications in the outer left breast.  EXAM: 2D DIGITAL DIAGNOSTIC BILATERAL MAMMOGRAM WITH CAD AND ADJUNCT TOMO  COMPARISON:  Previous exam(s).  ACR Breast Density Category b: There are scattered areas of fibroglandular density.  FINDINGS: The coarse dystrophic benign-appearing calcifications in the upper-outer left breast are unchanged. No suspicious microcalcifications are identified in either breast to suggest malignancy. No mass or architectural distortion is identified in either breast to suggest malignancy.  Mammographic images were processed with CAD.  IMPRESSION: Stable benign appearing dystrophic calcifications in the upper-outer left breast. No evidence of malignancy in either breast.  RECOMMENDATION: Screening mammogram in one year.(Code:[id number]-B-01Y)  I have discussed the findings and recommendations with the patient. Results were also provided in writing at the conclusion of the visit. If applicable, a reminder letter will be sent to the patient regarding the next appointment.  BI-RADS CATEGORY  2: Benign.  Electronically Signed   By: Curlene Dolphin M.D.   On: 06/01/2017 15:32  Complexity Note: Imaging results reviewed. Results shared with Ms. Bourque, using Layman's terms.                         Meds   Current Outpatient Medications:  .  aspirin EC 81 MG tablet, Take 81 mg by mouth daily., Disp: , Rfl:  .  atorvastatin (LIPITOR) 10 MG tablet, Take 1 tablet (10 mg total) by mouth at bedtime., Disp: 90 tablet, Rfl: 1 .  Cholecalciferol  (VITAMIN D) 2000 UNITS tablet, Take 2,000 Units by mouth daily., Disp: , Rfl:  .  cyclobenzaprine (FLEXERIL) 10 MG tablet, Take 1 tablet (10 mg total) by mouth at bedtime., Disp: 30 tablet, Rfl: 2 .  hydrochlorothiazide (HYDRODIURIL) 25 MG tablet, Take 1 tablet (25 mg total) by mouth daily., Disp: 90 tablet, Rfl: 1 .  [START ON 03/02/2018] HYDROcodone-acetaminophen (NORCO/VICODIN) 5-325 MG tablet, Take 1 tablet by mouth every 6 (six) hours as needed for severe pain., Disp: 120 tablet, Rfl: 0 .  lisinopril (PRINIVIL,ZESTRIL) 10 MG tablet, Take 1 tablet (10 mg total) by mouth daily., Disp: 30 tablet, Rfl: 6 .  sertraline (ZOLOFT) 50 MG tablet, Take 3 tablets (150 mg total) by mouth daily., Disp: 90 tablet, Rfl: 6 .  [START ON 01/31/2018] gabapentin (NEURONTIN) 300 MG capsule, Take 1 capsule (300 mg total) by mouth 3 (three) times daily., Disp: 90 capsule, Rfl: 2 .  [START ON 04/01/2018] HYDROcodone-acetaminophen (NORCO/VICODIN) 5-325 MG tablet, Take 1 tablet by mouth every 6 (six) hours as needed for severe pain., Disp: 120 tablet, Rfl: 0 .  [START ON 01/31/2018] HYDROcodone-acetaminophen (NORCO/VICODIN) 5-325 MG tablet, Take 1 tablet by mouth every 6 (six) hours as needed for moderate pain., Disp: 120 tablet, Rfl: 0  ROS  Constitutional: Denies any fever or chills Gastrointestinal: No reported hemesis, hematochezia, vomiting, or acute GI distress Musculoskeletal: Denies any acute onset joint swelling, redness, loss of ROM, or weakness Neurological: No reported episodes of acute onset apraxia, aphasia, dysarthria, agnosia, amnesia, paralysis, loss of coordination, or loss of consciousness  Allergies  Ms. Ormiston is allergic to simvastatin.  [place]  Drug: Ms. Spoon  reports that she does not use drugs. Alcohol:  reports that she does not drink alcohol. Tobacco:  reports that she has never smoked. She has never used smokeless tobacco. Medical:  has a past medical history of Allergy, Chronic back pain,  Depression, DJD (degenerative joint disease), Hyperlipidemia, Hypertension, Menopause, Musculoskeletal pain (12/07/2015), and TMJ PAIN (05/17/2010). Surgical: Ms. Torrens  has a past surgical history that includes G3 P3 . Family: family history includes Breast cancer in her sister; Cancer in her brother and sister; Colon cancer in her brother; Diabetes in her unknown relative.  Constitutional Exam  General appearance: Well nourished, well developed, and well hydrated. In no apparent acute distress Vitals:   01/22/18 1024  BP: 138/89  Pulse: 70  Temp: 98.2 F (36.8 C)  SpO2: 100%  Weight: 251 lb (113.9 kg)  Height: 5' 4" (1.626 m)  Psych/Mental status: Alert, oriented x 3 (person, place, & time)       Eyes: PERLA Respiratory: No evidence of acute respiratory distress  Lumbar Spine Area Exam  Skin & Axial Inspection: No masses, redness, or swelling Alignment: Symmetrical Functional ROM: Unrestricted ROM       Stability: No instability detected Muscle Tone/Strength: Functionally intact. No obvious neuro-muscular anomalies detected. Sensory (Neurological): Unimpaired Palpation: No palpable anomalies       Provocative Tests: Lumbar Hyperextension/rotation test: deferred today       Lumbar quadrant test (Kemp's test): deferred today       Lumbar Lateral bending test: deferred today       Patrick's Maneuver: deferred today  FABER test: deferred today       Thigh-thrust test: deferred today       S-I compression test: deferred today       S-I distraction test: deferred today        Gait & Posture Assessment  Ambulation: Unassisted Gait: Relatively normal for age and body habitus Posture: WNL   Lower Extremity Exam    Side: Right lower extremity  Side: Left lower extremity  Stability: No instability observed          Stability: No instability observed          Skin & Extremity Inspection: Skin color, temperature, and hair growth are WNL. No peripheral edema or  cyanosis. No masses, redness, swelling, asymmetry, or associated skin lesions. No contractures.  Skin & Extremity Inspection: Skin color, temperature, and hair growth are WNL. No peripheral edema or cyanosis. No masses, redness, swelling, asymmetry, or associated skin lesions. No contractures.  Functional ROM: Unrestricted ROM                  Functional ROM: Unrestricted ROM                  Muscle Tone/Strength: Functionally intact. No obvious neuro-muscular anomalies detected.  Muscle Tone/Strength: Functionally intact. No obvious neuro-muscular anomalies detected.  Sensory (Neurological): Unimpaired  Sensory (Neurological): Unimpaired  Palpation: No palpable anomalies  Palpation: Tender   Assessment  Primary Diagnosis & Pertinent Problem List: The primary encounter diagnosis was Lumbar spondylosis. Diagnoses of Osteoarthritis of knee (Bilateral), Chronic knee pain (Bilateral) (L>R), Fibromyalgia, Chronic pain syndrome, and Neurogenic pain were also pertinent to this visit.  Status Diagnosis  Controlled Improving Improving 1. Lumbar spondylosis   2. Osteoarthritis of knee (Bilateral)   3. Chronic knee pain (Bilateral) (L>R)   4. Fibromyalgia   5. Chronic pain syndrome   6. Neurogenic pain     Problems updated and reviewed during this visit: Problem  Osteoarthritis of knee (Bilateral)  Chronic knee pain (Bilateral) (L>R)   Plan of Care  Pharmacotherapy (Medications Ordered): Meds ordered this encounter  Medications  . HYDROcodone-acetaminophen (NORCO/VICODIN) 5-325 MG tablet    Sig: Take 1 tablet by mouth every 6 (six) hours as needed for severe pain.    Dispense:  120 tablet    Refill:  0    Do not place this medication, or any other prescription from our practice, on "Automatic Refill". Patient may have prescription filled one day early if pharmacy is closed on scheduled refill date. Do not fill until: 04/01/2018 To last until:05/01/2018    Order Specific Question:    Supervising Provider    Answer:   Milinda Pointer [phone]  . HYDROcodone-acetaminophen (NORCO/VICODIN) 5-325 MG tablet    Sig: Take 1 tablet by mouth every 6 (six) hours as needed for severe pain.    Dispense:  120 tablet    Refill:  0    Do not place this medication, or any other prescription from our practice, on "Automatic Refill". Patient may have prescription filled one day early if pharmacy is closed on scheduled refill date. Do not fill until: 03/02/2018 To last until: 04/01/2018    Order Specific Question:   Supervising Provider    Answer:   Milinda Pointer [phone]  . HYDROcodone-acetaminophen (NORCO/VICODIN) 5-325 MG tablet    Sig: Take 1 tablet by mouth every 6 (six) hours as needed for moderate pain.    Dispense:  120 tablet    Refill:  0  Do not place this medication, or any other prescription from our practice, on "Automatic Refill". Patient may have prescription filled one day early if pharmacy is closed on scheduled refill date. Do not fill until: 01/31/2018 To last until:03/02/2018    Order Specific Question:   Supervising Provider    Answer:   Milinda Pointer [phone]  . gabapentin (NEURONTIN) 300 MG capsule    Sig: Take 1 capsule (300 mg total) by mouth 3 (three) times daily.    Dispense:  90 capsule    Refill:  2    Do not place this medication, or any other prescription from our practice, on "Automatic Refill". Patient may have prescription filled one day early if pharmacy is closed on scheduled refill date.    Order Specific Question:   Supervising Provider    Answer:   Milinda Pointer [[id number]]   New Prescriptions   No medications on file   Medications administered today: Jacklin L. Johns had no medications administered during this visit. Lab-work, procedure(s), and/or referral(s): No orders of the defined types were placed in this encounter.  Imaging and/or referral(s): None  Interventional therapies: Planned, scheduled, and/or pending:   left intra-articular knee injection #4 as scheduled    Considering:  Diagnostic/palliative left intra-articular knee injection.  Diagnostic bilateral lumbar facet block Left L4-5 lumbar epiduralsteroid injection   Palliative PRN treatment(s):  Palliative bilateral lumbar facet block Left L4-5 lumbar epiduralsteroid injection Palliative left intra-articular knee injection    Provider-requested follow-up: Return in about 3 months (around 04/24/2018) for MedMgmt with Me Donella Stade Edison Pace).  Future Appointments  Date Time Provider [place]  02/05/2018 10:15 AM Milinda Pointer, MD [facility] None  04/24/2018 10:30 AM Vevelyn Francois, NP [facility] None   Primary Care Physician: Colon Branch, MD Location: [facility] Note by: Vevelyn Francois NP Date: 01/22/2018; Time: 2:43 PM  Pain Score Disclaimer: We use the NRS-11 scale. This is a self-reported, subjective measurement of pain severity with only modest accuracy. It is used primarily to identify changes within a particular patient. It must be understood that outpatient pain scales are significantly less accurate that those used for research, where they can be applied under ideal controlled circumstances with minimal exposure to variables. In reality, the score is likely to be a combination of pain intensity and pain affect, where pain affect describes the degree of emotional arousal or changes in action readiness caused by the sensory experience of pain. Factors such as social and work situation, setting, emotional state, anxiety levels, expectation, and prior pain experience may influence pain perception and show large inter-individual differences that may also be affected by time variables.  Patient instructions provided during this appointment: Patient Instructions   Gabapentin escribed Hydrocodone - apap 5-325 mg x 3 months to begin filling on 01/31/18 given to patient   Medication  Rules  Applies to: All patients receiving prescriptions (written or electronic).  Pharmacy of record: Pharmacy where electronic prescriptions will be sent. If written prescriptions are taken to a different pharmacy, please inform the nursing staff. The pharmacy listed in the electronic medical record should be the one where you would like electronic prescriptions to be sent.  Prescription refills: Only during scheduled appointments. Applies to both, written and electronic prescriptions.  NOTE: The following applies primarily to controlled substances (Opioid* Pain Medications).   Patient's responsibilities: 1. Pain Pills: Bring all pain pills to every appointment (except for procedure appointments). 2. Pill Bottles: Bring pills in original pharmacy bottle.  Always bring newest bottle. Bring bottle, even if empty. 3. Medication refills: You are responsible for knowing and keeping track of what medications you need refilled. The day before your appointment, write a list of all prescriptions that need to be refilled. Bring that list to your appointment and give it to the admitting nurse. Prescriptions will be written only during appointments. If you forget a medication, it will not be "Called in", "Faxed", or "electronically sent". You will need to get another appointment to get these prescribed. 4. Prescription Accuracy: You are responsible for carefully inspecting your prescriptions before leaving our office. Have the discharge nurse carefully go over each prescription with you, before taking them home. Make sure that your name is accurately spelled, that your address is correct. Check the name and dose of your medication to make sure it is accurate. Check the number of pills, and the written instructions to make sure they are clear and accurate. Make sure that you are given enough medication to last until your next medication refill appointment. 5. Taking Medication: Take medication as prescribed. Never  take more pills than instructed. Never take medication more frequently than prescribed. Taking less pills or less frequently is permitted and encouraged, when it comes to controlled substances (written prescriptions).  6. Inform other Doctors: Always inform, all of your healthcare providers, of all the medications you take. 7. Pain Medication from other Providers: You are not allowed to accept any additional pain medication from any other Doctor or Healthcare provider. There are two exceptions to this rule. (see below) In the event that you require additional pain medication, you are responsible for notifying us, as stated below. 8. Medication Agreement: You are responsible for carefully reading and following our Medication Agreement. This must be signed before receiving any prescriptions from our practice. Safely store a copy of your signed Agreement. Violations to the Agreement will result in no further prescriptions. (Additional copies of our Medication Agreement are available upon request.) 9. Laws, Rules, & Regulations: All patients are expected to follow all Federal and [employer], Rules, [employer]. Ignorance of the Laws does not constitute a valid excuse. The use of any illegal substances is prohibited. 10. Adopted CDC guidelines & recommendations: Target dosing levels will be at or below 60 MME/day. Use of benzodiazepines** is not recommended.  Exceptions: There are only two exceptions to the rule of not receiving pain medications from other Healthcare Providers. 1. Exception #1 (Emergencies): In the event of an emergency (i.e.: accident requiring emergency care), you are allowed to receive additional pain medication. However, you are responsible for: As soon as you are able, call our office [phone], at any time of the day or night, and leave a message stating your name, the date and nature of the emergency, and the name and dose of the medication prescribed. In the event that  your call is answered by a member of our staff, make sure to document and save the date, time, and the name of the person that took your information.  2. Exception #2 (Planned Surgery): In the event that you are scheduled by another doctor or dentist to have any type of surgery or procedure, you are allowed (for a period no longer than 30 days), to receive additional pain medication, for the acute post-op pain. However, in this case, you are responsible for picking up a copy of our "Post-op Pain Management for Surgeons" handout, and giving it to your surgeon or dentist. This document is available  at our office, and does not require an appointment to obtain it. Simply go to our office during business hours (Monday-Thursday from 8:00 AM to 4:00 PM) (Friday 8:00 AM to 12:00 Noon) or if you have a scheduled appointment with [place], prior to your surgery, and ask for it by name. In addition, you will need to provide [place] with your name, name of your surgeon, type of surgery, and date of procedure or surgery.  *Opioid medications include: morphine, codeine, oxycodone, oxymorphone, hydrocodone, hydromorphone, meperidine, tramadol, tapentadol, buprenorphine, fentanyl, methadone. **Benzodiazepine medications include: diazepam (Valium), alprazolam (Xanax), clonazepam (Klonopine), lorazepam (Ativan), clorazepate (Tranxene), chlordiazepoxide (Librium), estazolam (Prosom), oxazepam (Serax), temazepam (Restoril), triazolam (Halcion) (Last updated: 09/20/2017) ____________________________________________________________________________________________   BMI Assessment: Estimated body mass index is 43.08 kg/m as calculated from the following:   Height as of this encounter: 5' 4" (1.626 m).   Weight as of this encounter: 251 lb (113.9 kg).  BMI interpretation table: BMI level Category Range association with higher incidence of chronic pain  <18 kg/m2 Underweight   18.5-24.9 kg/m2 Ideal body weight   25-29.9 kg/m2  Overweight Increased incidence by 20%  30-34.9 kg/m2 Obese (Class I) Increased incidence by 68%  35-39.9 kg/m2 Severe obesity (Class II) Increased incidence by 136%  >40 kg/m2 Extreme obesity (Class III) Increased incidence by 254%   Patient's current BMI Ideal Body weight  Body mass index is 43.08 kg/m. Ideal body weight: 54.7 kg (120 lb 9.5 oz) Adjusted ideal body weight: 78.4 kg (172 lb 12.1 oz)   BMI Readings from Last 4 Encounters:  01/22/18 43.08 kg/m  01/15/18 43.08 kg/m  01/01/18 41.77 kg/m  12/24/17 43.17 kg/m   Wt Readings from Last 4 Encounters:  01/22/18 251 lb (113.9 kg)  01/15/18 251 lb (113.9 kg)  01/01/18 251 lb (113.9 kg)  12/24/17 251 lb 8 oz (114.1 kg)

## 2018-01-22 NOTE — Patient Instructions (Addendum)
Gabapentin escribed Hydrocodone - apap 5-325 mg x 3 months to begin filling on 01/31/18 given to patient   Medication Rules  Applies to: All patients receiving prescriptions (written or electronic).  Pharmacy of record: Pharmacy where electronic prescriptions will be sent. If written prescriptions are taken to a different pharmacy, please inform the nursing staff. The pharmacy listed in the electronic medical record should be the one where you would like electronic prescriptions to be sent.  Prescription refills: Only during scheduled appointments. Applies to both, written and electronic prescriptions.  NOTE: The following applies primarily to controlled substances (Opioid* Pain Medications).   Patient's responsibilities: 1. Pain Pills: Bring all pain pills to every appointment (except for procedure appointments). 2. Pill Bottles: Bring pills in original pharmacy bottle. Always bring newest bottle. Bring bottle, even if empty. 3. Medication refills: You are responsible for knowing and keeping track of what medications you need refilled. The day before your appointment, write a list of all prescriptions that need to be refilled. Bring that list to your appointment and give it to the admitting nurse. Prescriptions will be written only during appointments. If you forget a medication, it will not be "Called in", "Faxed", or "electronically sent". You will need to get another appointment to get these prescribed. 4. Prescription Accuracy: You are responsible for carefully inspecting your prescriptions before leaving our office. Have the discharge nurse carefully go over each prescription with you, before taking them home. Make sure that your name is accurately spelled, that your address is correct. Check the name and dose of your medication to make sure it is accurate. Check the number of pills, and the written instructions to make sure they are clear and accurate. Make sure that you are given enough  medication to last until your next medication refill appointment. 5. Taking Medication: Take medication as prescribed. Never take more pills than instructed. Never take medication more frequently than prescribed. Taking less pills or less frequently is permitted and encouraged, when it comes to controlled substances (written prescriptions).  6. Inform other Doctors: Always inform, all of your healthcare providers, of all the medications you take. 7. Pain Medication from other Providers: You are not allowed to accept any additional pain medication from any other Doctor or Healthcare provider. There are two exceptions to this rule. (see below) In the event that you require additional pain medication, you are responsible for notifying us, as stated below. 8. Medication Agreement: You are responsible for carefully reading and following our Medication Agreement. This must be signed before receiving any prescriptions from our practice. Safely store a copy of your signed Agreement. Violations to the Agreement will result in no further prescriptions. (Additional copies of our Medication Agreement are available upon request.) 9. Laws, Rules, & Regulations: All patients are expected to follow all Federal and [employer], [employer], Rules, [employer]. Ignorance of the Laws does not constitute a valid excuse. The use of any illegal substances is prohibited. 10. Adopted CDC guidelines & recommendations: Target dosing levels will be at or below 60 MME/day. Use of benzodiazepines** is not recommended.  Exceptions: There are only two exceptions to the rule of not receiving pain medications from other Healthcare Providers. 1. Exception #1 (Emergencies): In the event of an emergency (i.e.: accident requiring emergency care), you are allowed to receive additional pain medication. However, you are responsible for: As soon as you are able, call our office [phone], at any time of the day or night, and leave a message  stating your name, the date and nature of the emergency, and the name and dose of the medication prescribed. In the event that your call is answered by a member of our staff, make sure to document and save the date, time, and the name of the person that took your information.  2. Exception #2 (Planned Surgery): In the event that you are scheduled by another doctor or dentist to have any type of surgery or procedure, you are allowed (for a period no longer than 30 days), to receive additional pain medication, for the acute post-op pain. However, in this case, you are responsible for picking up a copy of our "Post-op Pain Management for Surgeons" handout, and giving it to your surgeon or dentist. This document is available at our office, and does not require an appointment to obtain it. Simply go to our office during business hours (Monday-Thursday from 8:00 AM to 4:00 PM) (Friday 8:00 AM to 12:00 Noon) or if you have a scheduled appointment with [place], prior to your surgery, and ask for it by name. In addition, you will need to provide [place] with your name, name of your surgeon, type of surgery, and date of procedure or surgery.  *Opioid medications include: morphine, codeine, oxycodone, oxymorphone, hydrocodone, hydromorphone, meperidine, tramadol, tapentadol, buprenorphine, fentanyl, methadone. **Benzodiazepine medications include: diazepam (Valium), alprazolam (Xanax), clonazepam (Klonopine), lorazepam (Ativan), clorazepate (Tranxene), chlordiazepoxide (Librium), estazolam (Prosom), oxazepam (Serax), temazepam (Restoril), triazolam (Halcion) (Last updated: 09/20/2017) ____________________________________________________________________________________________   BMI Assessment: Estimated body mass index is 43.08 kg/m as calculated from the following:   Height as of this encounter: 5\' 4"  (1.626 m).   Weight as of this encounter: 251 lb (113.9 kg).  BMI interpretation table: BMI level Category Range  association with higher incidence of chronic pain  <18 kg/m2 Underweight   18.5-24.9 kg/m2 Ideal body weight   25-29.9 kg/m2 Overweight Increased incidence by 20%  30-34.9 kg/m2 Obese (Class I) Increased incidence by 68%  35-39.9 kg/m2 Severe obesity (Class II) Increased incidence by 136%  >40 kg/m2 Extreme obesity (Class III) Increased incidence by 254%   Patient's current BMI Ideal Body weight  Body mass index is 43.08 kg/m. Ideal body weight: 54.7 kg (120 lb 9.5 oz) Adjusted ideal body weight: 78.4 kg (172 lb 12.1 oz)   BMI Readings from Last 4 Encounters:  01/22/18 43.08 kg/m  01/15/18 43.08 kg/m  01/01/18 41.77 kg/m  12/24/17 43.17 kg/m   Wt Readings from Last 4 Encounters:  01/22/18 251 lb (113.9 kg)  01/15/18 251 lb (113.9 kg)  01/01/18 251 lb (113.9 kg)  12/24/17 251 lb 8 oz (114.1 kg)

## 2018-01-22 NOTE — Progress Notes (Signed)
Nursing Pain Medication Assessment:  Safety precautions to be maintained throughout the outpatient stay will include: orient to surroundings, keep bed in low position, maintain call bell within reach at all times, provide assistance with transfer out of bed and ambulation.  Medication Inspection Compliance: Pill count conducted under aseptic conditions, in front of the patient. Neither the pills nor the bottle was removed from the patient's sight at any time. Once count was completed pills were immediately returned to the patient in their original bottle.  Medication: Oxycodone/APAP Pill/Patch Count: 29 of 120 pills remain Pill/Patch Appearance: Markings consistent with prescribed medication Bottle Appearance: Standard pharmacy container. Clearly labeled. Filled Date: 6 / 33 / 2019 Last Medication intake:  Today

## 2018-02-05 ENCOUNTER — Ambulatory Visit: Payer: Self-pay | Admitting: Pain Medicine

## 2018-02-07 ENCOUNTER — Ambulatory Visit: Payer: Medicare HMO | Attending: Pain Medicine | Admitting: Pain Medicine

## 2018-02-07 NOTE — Progress Notes (Deleted)
Patient's Name: Wanda [name]  MRN: [id number]  Referring Provider: Colon Branch, MD  DOB: Mar 04, 1946  PCP: Colon Branch, MD  DOS: 02/07/2018  Note by: Gaspar Cola, MD  Service setting: Ambulatory outpatient  Specialty: Interventional Pain Management  Patient type: Established  Location: [facility] (AMB) Pain Management Facility  Visit type: Interventional Procedure   Primary Reason for Visit: Interventional Pain Management Treatment. CC: No chief complaint on file.  Procedure:          Anesthesia, Analgesia, Anxiolysis:  Type: Therapeutic Intra-Articular Hyalgan Knee Injection #4  Region: Lateral infrapatellar Knee Region Level: Knee Joint Laterality: Left knee  Type: Local Anesthesia Indication(s): Analgesia         Local Anesthetic: Lidocaine 1-2% Route: Infiltration ([place]/IM) IV Access: Declined Sedation: Declined    Indications: 1. Osteoarthritis of knee (Bilateral)   2. Chronic knee pain (Bilateral) (L>R)    Pain Score: Pre-procedure:  /10 Post-procedure:  /10  Pre-op Assessment:  Wanda [name] is a 72 y.o. (year old), female patient, seen today for interventional treatment. She  has a past surgical history that includes G3 P3 . Wanda [name] has a current medication list which includes the following prescription(s): aspirin ec, atorvastatin, vitamin d, cyclobenzaprine, gabapentin, hydrochlorothiazide, hydrocodone-acetaminophen, hydrocodone-acetaminophen, hydrocodone-acetaminophen, lisinopril, and sertraline. Her primarily concern today is the No chief complaint on file.  Initial Vital Signs:  Pulse/HCG Rate:    Temp:   Resp:   BP:   SpO2:    BMI: Estimated body mass index is 43.08 kg/m as calculated from the following:   Height as of 01/22/18: 5\' 4"  (1.626 m).   Weight as of 01/22/18: 251 lb (113.9 kg).  Risk Assessment: Allergies: Reviewed. She is allergic to simvastatin.  Allergy Precautions: None required Coagulopathies: Reviewed. None identified.  Blood-thinner  therapy: None at this time Active Infection(s): Reviewed. None identified. Wanda [name] is afebrile  Site Confirmation: Wanda [name] was asked to confirm the procedure and laterality before marking the site Procedure checklist: Completed Consent: Before the procedure and under the influence of no sedative(s), amnesic(s), or anxiolytics, the patient was informed of the treatment options, risks and possible complications. To fulfill our ethical and legal obligations, as recommended by the American Medical Association's Code of Ethics, I have informed the patient of my clinical impression; the nature and purpose of the treatment or procedure; the risks, benefits, and possible complications of the intervention; the alternatives, including doing nothing; the risk(s) and benefit(s) of the alternative treatment(s) or procedure(s); and the risk(s) and benefit(s) of doing nothing. The patient was provided information about the general risks and possible complications associated with the procedure. These may include, but are not limited to: failure to achieve desired goals, infection, bleeding, organ or nerve damage, allergic reactions, paralysis, and death. In addition, the patient was informed of those risks and complications associated to the procedure, such as failure to decrease pain; infection; bleeding; organ or nerve damage with subsequent damage to sensory, motor, and/or autonomic systems, resulting in permanent pain, numbness, and/or weakness of one or several areas of the body; allergic reactions; (i.e.: anaphylactic reaction); and/or death. Furthermore, the patient was informed of those risks and complications associated with the medications. These include, but are not limited to: allergic reactions (i.e.: anaphylactic or anaphylactoid reaction(s)); adrenal axis suppression; blood sugar elevation that in diabetics may result in ketoacidosis or comma; water retention that in patients with history of  congestive heart failure may result in shortness of breath, pulmonary edema, and decompensation  with resultant heart failure; weight gain; swelling or edema; medication-induced neural toxicity; particulate matter embolism and blood vessel occlusion with resultant organ, and/or nervous system infarction; and/or aseptic necrosis of one or more joints. Finally, the patient was informed that Medicine is not an exact science; therefore, there is also the possibility of unforeseen or unpredictable risks and/or possible complications that may result in a catastrophic outcome. The patient indicated having understood very clearly. We have given the patient no guarantees and we have made no promises. Enough time was given to the patient to ask questions, all of which were answered to the patient's satisfaction. Wanda [name] has indicated that she wanted to continue with the procedure. Attestation: I, the ordering provider, attest that I have discussed with the patient the benefits, risks, side-effects, alternatives, likelihood of achieving goals, and potential problems during recovery for the procedure that I have provided informed consent. Date  Time: {CHL [facility]-PAIN TIME CHOICES:[id number]}  Pre-Procedure Preparation:  Monitoring: As per clinic protocol. Respiration, ETCO2, SpO2, BP, heart rate and rhythm monitor placed and checked for adequate function Safety Precautions: Patient was assessed for positional comfort and pressure points before starting the procedure. Time-out: I initiated and conducted the "Time-out" before starting the procedure, as per protocol. The patient was asked to participate by confirming the accuracy of the "Time Out" information. Verification of the correct person, site, and procedure were performed and confirmed by me, the nursing staff, and the patient. "Time-out" conducted as per Joint Commission's Universal Protocol (UP.01.01.01). Time:    Description of Procedure:          Position:  Sitting Target Area: Knee Joint Approach: Just above the Lateral tibial plateau, lateral to the infrapatellar tendon. Area Prepped: Entire knee area, from the mid-thigh to the mid-shin. Prepping solution: ChloraPrep (2% chlorhexidine gluconate and 70% isopropyl alcohol) Safety Precautions: Aspiration looking for blood return was conducted prior to all injections. At no point did we inject any substances, as a needle was being advanced. No attempts were made at seeking any paresthesias. Safe injection practices and needle disposal techniques used. Medications properly checked for expiration dates. SDV (single dose vial) medications used. Description of the Procedure: Protocol guidelines were followed. The patient was placed in position over the fluoroscopy table. The target area was identified and the area prepped in the usual manner. Skin & deeper tissues infiltrated with local anesthetic. Appropriate amount of time allowed to pass for local anesthetics to take effect. The procedure needles were then advanced to the target area. Proper needle placement secured. Negative aspiration confirmed. Solution injected in intermittent fashion, asking for systemic symptoms every 0.5cc of injectate. The needles were then removed and the area cleansed, making sure to leave some of the prepping solution back to take advantage of its long term bactericidal properties. There were no vitals filed for this visit.  Start Time:   hrs. End Time:   hrs. Materials:  Needle(s) Type: Regular needle Gauge: 22G Length: 3.5-in Medication(s): Please see orders for medications and dosing details.  Imaging Guidance:          Type of Imaging Technique: None used Indication(s): N/A Exposure Time: No patient exposure Contrast: None used. Fluoroscopic Guidance: N/A Ultrasound Guidance: N/A Interpretation: N/A  Antibiotic Prophylaxis:   Anti-infectives (From admission, onward)   None     Indication(s): None  identified  Post-operative Assessment:  Post-procedure Vital Signs:  Pulse/HCG Rate:    Temp:   Resp:   BP:   SpO2:    EBL:  None  Complications: No immediate post-treatment complications observed by team, or reported by patient.  Note: The patient tolerated the entire procedure well. A repeat set of vitals were taken after the procedure and the patient was kept under observation following institutional policy, for this type of procedure. Post-procedural neurological assessment was performed, showing return to baseline, prior to discharge. The patient was provided with post-procedure discharge instructions, including a section on how to identify potential problems. Should any problems arise concerning this procedure, the patient was given instructions to immediately contact us, at any time, without hesitation. In any case, we plan to contact the patient by telephone for a follow-up status report regarding this interventional procedure.  Comments:  No additional relevant information.  Plan of Care   Imaging Orders  No imaging studies ordered today   Procedure Orders    No procedure(s) ordered today    Medications ordered for procedure: No orders of the defined types were placed in this encounter.  Medications administered: Dan L. Ruffino had no medications administered during this visit.  See the medical record for exact dosing, route, and time of administration.  New Prescriptions   No medications on file   Disposition: Discharge home  Discharge Date & Time: 02/07/2018;   hrs.   Physician-requested Follow-up: No follow-ups on file.  Future Appointments  Date Time Provider [place]  02/07/2018 12:45 PM Milinda Pointer, MD [facility] None  04/24/2018 10:30 AM Vevelyn Francois, NP [facility] None   Primary Care Physician: Colon Branch, MD Location: [facility] Note by: Gaspar Cola, MD Date: 02/07/2018; Time: 7:42  AM  Disclaimer:  Medicine is not an Chief Strategy Officer. The only guarantee in medicine is that nothing is guaranteed. It is important to note that the decision to proceed with this intervention was based on the information collected from the patient. The Data and conclusions were drawn from the patient's questionnaire, the interview, and the physical examination. Because the information was provided in large part by the patient, it cannot be guaranteed that it has not been purposely or unconsciously manipulated. Every effort has been made to obtain as much relevant data as possible for this evaluation. It is important to note that the conclusions that lead to this procedure are derived in large part from the available data. Always take into account that the treatment will also be dependent on availability of resources and existing treatment guidelines, considered by other Pain Management Practitioners as being common knowledge and practice, at the time of the intervention. For Medico-Legal purposes, it is also important to point out that variation in procedural techniques and pharmacological choices are the acceptable norm. The indications, contraindications, technique, and results of the above procedure should only be interpreted and judged by a Board-Certified Interventional Pain Specialist with extensive familiarity and expertise in the same exact procedure and technique.

## 2018-02-11 ENCOUNTER — Encounter (HOSPITAL_BASED_OUTPATIENT_CLINIC_OR_DEPARTMENT_OTHER): Payer: Self-pay | Admitting: *Deleted

## 2018-02-11 ENCOUNTER — Encounter: Payer: Self-pay | Admitting: Family

## 2018-02-11 ENCOUNTER — Emergency Department (HOSPITAL_BASED_OUTPATIENT_CLINIC_OR_DEPARTMENT_OTHER)
Admission: EM | Admit: 2018-02-11 | Discharge: 2018-02-11 | Disposition: A | Discharge status: Home / Self Care | Payer: Medicare HMO | Attending: Emergency Medicine | Admitting: Emergency Medicine

## 2018-02-11 ENCOUNTER — Emergency Department (HOSPITAL_BASED_OUTPATIENT_CLINIC_OR_DEPARTMENT_OTHER): Payer: Medicare HMO

## 2018-02-11 ENCOUNTER — Ambulatory Visit (INDEPENDENT_AMBULATORY_CARE_PROVIDER_SITE_OTHER): Payer: Medicare HMO | Admitting: Family

## 2018-02-11 ENCOUNTER — Other Ambulatory Visit: Payer: Self-pay

## 2018-02-11 VITALS — BP 148/88 | HR 89 | Temp 98.2°F | Ht 64.0 in

## 2018-02-11 DIAGNOSIS — N3001 Acute cystitis with hematuria: Secondary | ICD-10-CM | POA: Diagnosis not present

## 2018-02-11 DIAGNOSIS — R109 Unspecified abdominal pain: Secondary | ICD-10-CM

## 2018-02-11 DIAGNOSIS — I10 Essential (primary) hypertension: Secondary | ICD-10-CM | POA: Insufficient documentation

## 2018-02-11 DIAGNOSIS — R1031 Right lower quadrant pain: Secondary | ICD-10-CM | POA: Insufficient documentation

## 2018-02-11 DIAGNOSIS — F419 Anxiety disorder, unspecified: Secondary | ICD-10-CM | POA: Insufficient documentation

## 2018-02-11 DIAGNOSIS — N83202 Unspecified ovarian cyst, left side: Secondary | ICD-10-CM | POA: Diagnosis not present

## 2018-02-11 DIAGNOSIS — F329 Major depressive disorder, single episode, unspecified: Secondary | ICD-10-CM | POA: Insufficient documentation

## 2018-02-11 DIAGNOSIS — M545 Low back pain: Secondary | ICD-10-CM | POA: Diagnosis not present

## 2018-02-11 DIAGNOSIS — I700 Atherosclerosis of aorta: Secondary | ICD-10-CM | POA: Diagnosis not present

## 2018-02-11 LAB — POC URINALSYSI DIPSTICK (AUTOMATED)
BILIRUBIN UA: NEGATIVE
Blood, UA: POSITIVE
GLUCOSE UA: NEGATIVE
Ketones, UA: POSITIVE
LEUKOCYTES UA: NEGATIVE
Nitrite, UA: NEGATIVE
Protein, UA: NEGATIVE
Spec Grav, UA: 1.02 (ref 1.010–1.025)
Urobilinogen, UA: 0.2 E.U./dL
pH, UA: 6 (ref 5.0–8.0)

## 2018-02-11 LAB — URINALYSIS, ROUTINE W REFLEX MICROSCOPIC
Bilirubin Urine: NEGATIVE
GLUCOSE, UA: NEGATIVE mg/dL
Ketones, ur: 15 mg/dL — AB
NITRITE: NEGATIVE
PH: 6 (ref 5.0–8.0)
PROTEIN: NEGATIVE mg/dL
Specific Gravity, Urine: 1.02 (ref 1.005–1.030)

## 2018-02-11 LAB — URINALYSIS, MICROSCOPIC (REFLEX)

## 2018-02-11 MED ORDER — KETOROLAC TROMETHAMINE 30 MG/ML IJ SOLN
30.0000 mg | Freq: Once | INTRAMUSCULAR | Status: AC
  Administered 2018-02-11: 30 mg via INTRAMUSCULAR
  Filled 2018-02-11: qty 1

## 2018-02-11 MED ORDER — TRAMADOL HCL 50 MG PO TABS: 50.0000 mg | ORAL_TABLET | Freq: Four times a day (QID) | ORAL | 0 refills | Status: DC | PRN

## 2018-02-11 MED ORDER — CEPHALEXIN 500 MG PO CAPS: 500.0000 mg | ORAL_CAPSULE | Freq: Three times a day (TID) | ORAL | 0 refills | Status: AC

## 2018-02-11 NOTE — ED Notes (Signed)
ED Provider at bedside. 

## 2018-02-11 NOTE — ED Provider Notes (Signed)
Emergency Department Provider Note   I have reviewed the triage vital signs and the nursing notes.   HISTORY  Chief Complaint Back Pain   HPI Wanda [name] is a 72 y.o. female with PMH of HLD, DJD, HTN, and MSK pain presents to the emergency department for evaluation of right flank pain.  Symptoms have been ongoing for the past 4 days.  Pain is severe and intermittent.  She went to her PCP today who performed a urine test which showed blood in the urine.  She was referred here for further testing.  She has no prior history of kidney stone.  No dysuria, hesitancy, urgency.  No fevers or chills.  No radiation of symptoms down the leg.  No numbness or weakness.  No difficulty walking.  Pain is severe and worse with movement.   Past Medical History:  Diagnosis Date  . Allergy    RHINITIS  . Chronic back pain   . Depression   . DJD (degenerative joint disease)    back pain , on disability  . Hyperlipidemia   . Hypertension   . Menopause    onset age 62   . Musculoskeletal pain 12/07/2015  . TMJ PAIN 05/17/2010   Qualifier: Diagnosis of  By: Larose Kells MD, [place]     Patient Active Problem List   Diagnosis Date Noted  . Osteoarthritis of knee (Bilateral) 01/01/2018  . Chronic knee pain (Bilateral) (L>R) 10/30/2017  . PCP NOTES >>>>>> 08/31/2016  . Osteoarthritis of hips (Bilateral) 03/21/2016  . Morbid obesity with body mass index (BMI) of 40.0 to 44.9 in adult [facility]) 03/21/2016  . Herpes zoster 09/28/2015  . Encounter for therapeutic drug level monitoring 08/18/2015  . Annual physical exam 08/18/2015  . Chronic pain syndrome 05/20/2015  . Lumbar spondylosis 05/20/2015  . Myofascial pain 05/20/2015  . Fibromyalgia 05/20/2015  . Osteoarthrosis 05/20/2015  . Leg cramps 02/01/2015  . Vitamin D deficiency 11/03/2008  . Dyslipidemia 07/01/2007  . Depression with anxiety 07/01/2007  . Essential hypertension 07/01/2007  . ALLERGIC RHINITIS 07/01/2007  . CLIMACTERIC STATE,  FEMALE 07/01/2007    Past Surgical History:  Procedure Laterality Date  . G3 P3       Allergies Simvastatin  Family History  Problem Relation Age of Onset  . Cancer Sister        ??CERVICAL  . Cancer Brother        STOMACH  . Breast cancer Sister        sister dx age 77, aunt, cousin x 2   . Colon cancer Brother        dx age ~ 60 ?  [name] Diabetes Unknown        aunt   . Coronary artery disease Neg Hx   . CAD Neg Hx     Social History Social History   Tobacco Use  . Smoking status: Never Smoker  . Smokeless tobacco: Never Used  Substance Use Topics  . Alcohol use: No  . Drug use: No    Review of Systems  Constitutional: No fever/chills Eyes: No visual changes. ENT: No sore throat. Cardiovascular: Denies chest pain. Respiratory: Denies shortness of breath. Gastrointestinal: No abdominal pain.  No nausea, no vomiting.  No diarrhea.  No constipation. Genitourinary: Negative for dysuria. Musculoskeletal: Positive lower back and right flank pain.  Skin: Negative for rash. Neurological: Negative for headaches, focal weakness or numbness.  10-point ROS otherwise negative.  ____________________________________________   PHYSICAL EXAM:  VITAL SIGNS: ED  Triage Vitals  Enc Vitals Group     BP 02/11/18 1453 135/87     Pulse Rate 02/11/18 1453 94     Resp 02/11/18 1453 16     Temp 02/11/18 1453 98.7 F (37.1 C)     Temp src --      SpO2 02/11/18 1453 100 %     Weight 02/11/18 1452 250 lb (113.4 kg)     Height 02/11/18 1452 5\' 4"  (1.626 m)     Pain Score 02/11/18 1452 10   Constitutional: Alert and oriented. Well appearing and in no acute distress. Eyes: Conjunctivae are normal. Head: Atraumatic. Nose: No congestion/rhinnorhea. Mouth/Throat: Mucous membranes are moist.  Neck: No stridor.  Cardiovascular: Normal rate, regular rhythm. Good peripheral circulation. Grossly normal heart sounds.   Respiratory: Normal respiratory effort.  No retractions. Lungs  CTAB. Gastrointestinal: Soft and nontender. No distention.  Musculoskeletal: No lower extremity tenderness nor edema. No gross deformities of extremities. No midline lumbar spine tenderness. Positive right flank tenderness to palpation.  Neurologic:  Normal speech and language. No gross focal neurologic deficits are appreciated.  Skin:  Skin is warm, dry and intact. No rash noted.  ____________________________________________   LABS (all labs ordered are listed, but only abnormal results are displayed)  Labs Reviewed  URINALYSIS, ROUTINE W REFLEX MICROSCOPIC - Abnormal; Notable for the following components:      Result Value   APPearance TURBID (*)    Hgb urine dipstick MODERATE (*)    Ketones, ur 15 (*)    Leukocytes, UA TRACE (*)    All other components within normal limits  URINALYSIS, MICROSCOPIC (REFLEX) - Abnormal; Notable for the following components:   Bacteria, UA MANY (*)    All other components within normal limits   ____________________________________________  RADIOLOGY  Ct Renal Stone Study  Result Date: 02/11/2018 CLINICAL DATA:  72 year old female with right flank pain for 5 days. Denies history of stones. Initial encounter. EXAM: CT ABDOMEN AND PELVIS WITHOUT CONTRAST TECHNIQUE: Multidetector CT imaging of the abdomen and pelvis was performed following the standard protocol without IV contrast. COMPARISON:  None. FINDINGS: Lower chest: Mild scarring/atelectasis lung bases. Heart size top-normal. Coronary artery calcifications. Trace pericardial fluid. Slightly asymmetric breast parenchyma incompletely assessed. Hepatobiliary: Taking into account limitation by non contrast imaging, no worrisome hepatic lesion. Possible gallbladder sludge without calcified gallstone or common bile duct stone. Pancreas: Taking into account limitation by non contrast imaging, no worrisome pancreatic mass or inflammation. Spleen: Taking into account limitation by non contrast imaging, no  splenic mass or enlargement. Adrenals/Urinary Tract: No obstructing stone is noted. Parapelvic cysts suspected rather than hydronephrosis. If it were necessary to confirm this and exclude hydronephrosis, CT with contrast with delayed imaging through the kidneys may then be considered. Taking into account limitation by non contrast imaging, no worrisome renal mass. Minimal haziness surrounds the left adrenal gland of indeterminate etiology/significance. Partially distended noncontrast filled views of the urinary bladder without gross abnormality. Stomach/Bowel: No inflammation surrounds the appendix. Large portions of colon under distended. Diverticula most notable sigmoid colon. No extraluminal bowel inflammatory process to suggest diverticulitis. Small hiatal hernia.  No gastric abnormality noted. Vascular/Lymphatic: Mild atherosclerotic changes aorta without aneurysm. Scattered small lymph nodes without adenopathy. Reproductive: Left ovarian 3.1 x 2.9 x 3.4 cm cyst. Given the patient's age, recommend pelvic sonogram for further delineation. Other: No free air or bowel containing hernia. Supraumbilical fat and vessel containing hernia (less than 1 cm rent through which fat and vessels traverse  measuring up to 6 cm) Musculoskeletal: Prominent facet degenerative changes L3-4 through L5-S1 with minimal anterior slip L3 and L4 contributes to foraminal narrowing. Sacroiliac joint degenerative changes. IMPRESSION: No obstructing renal or ureteral stone. Parapelvic cysts suspected rather than hydronephrosis. If it were necessary to confirm this and exclude hydronephrosis, CT with contrast with delayed imaging through the kidneys may then be considered. Supraumbilical fat and vessel containing hernia (less than 1 cm rent through which fat and vessels traverse measuring up to 6 cm) No inflammation surrounds the appendix. Diverticulosis most notable sigmoid colon. Small hiatal hernia. Minimal haziness surrounds the left  adrenal gland of indeterminate etiology/significance. Left ovarian 3.1 x 2.9 x 3.4 cm cyst. Given the patient's age, recommend pelvic sonogram for further delineation. Aortic Atherosclerosis (ICD10-I70.0). Coronary artery calcifications. Degenerative changes sacroiliac joints and lower lumbar spine. Electronically Signed   By: Genia Del M.D.   On: 02/11/2018 16:23    ____________________________________________   PROCEDURES  Procedure(s) performed:   Procedures  None ____________________________________________   INITIAL IMPRESSION / ASSESSMENT AND PLAN / ED COURSE  Pertinent labs & imaging results that were available during my care of the patient were reviewed by me and considered in my medical decision making (see chart for details).  Patient presents to the emergency department with right lower back/flank pain.  Symptoms worse with movement.  This may be musculoskeletal but patient tells me that she went to her PCP today and they found blood in the urine.  Will obtain a CT renal scan to rule out kidney stone, obtain urine analysis, give Toradol for pain and reassess.  No anterior abdominal tenderness.  CT scan reviewed with no acute findings. Plan for treatment of possible developing UTI. Doubt pyelo. Suspect MSK etiology. Patient feeling better and ready for discharge.   At this time, I do not feel there is any life-threatening condition present. I have reviewed and discussed all results (EKG, imaging, lab, urine as appropriate), exam findings with patient. I have reviewed nursing notes and appropriate previous records.  I feel the patient is safe to be discharged home without further emergent workup. Discussed usual and customary return precautions. Patient and family (if present) verbalize understanding and are comfortable with this plan.  Patient will follow-up with their primary care provider. If they do not have a primary care provider, information for follow-up has been  provided to them. All questions have been answered.  ____________________________________________  FINAL CLINICAL IMPRESSION(S) / ED DIAGNOSES  Final diagnoses:  Right flank pain  Cyst of left ovary  Acute cystitis with hematuria     MEDICATIONS GIVEN DURING THIS VISIT:  Medications  ketorolac (TORADOL) 30 MG/ML injection 30 mg (30 mg Intramuscular Given 02/11/18 1541)     NEW OUTPATIENT MEDICATIONS STARTED DURING THIS VISIT:  Discharge Medication List as of 02/11/2018  4:35 PM    START taking these medications   Details  cephALEXin (KEFLEX) 500 MG capsule Take 1 capsule (500 mg total) by mouth 3 (three) times daily for 7 days., Starting Mon 02/11/2018, Until Mon 02/18/2018, Normal    traMADol (ULTRAM) 50 MG tablet Take 1 tablet (50 mg total) by mouth every 6 (six) hours as needed., Starting Mon 02/11/2018, Normal        Note:  This document was prepared using Dragon voice recognition software and may include unintentional dictation errors.  Nanda Quinton, MD Emergency Medicine    Ashritha Desrosiers, [facility], MD 02/12/18 [phone]

## 2018-02-11 NOTE — Discharge Instructions (Addendum)
You have been seen in the Emergency Department (ED) for abdominal pain.  Your evaluation did not identify a clear cause of your symptoms but was generally reassuring.  You do have some cysts in the pelvis and will need to call your OB tomorrow to schedule a follow up appointment. You may also have a urine infection and I am starting medication for this. Take the Tramadol only for severe pain and do not take while driving.   Please follow up as instructed above regarding today?s emergent visit and the symptoms that are bothering you.  Return to the ED if your abdominal pain worsens or fails to improve, you develop bloody vomiting, bloody diarrhea, you are unable to tolerate fluids due to vomiting, fever greater than 101, or other symptoms that concern you.

## 2018-02-11 NOTE — Progress Notes (Signed)
Wanda [name] is a 72 y.o. female with the following history as recorded in [employer]:  Patient Active Problem List   Diagnosis Date Noted  . Osteoarthritis of knee (Bilateral) 01/01/2018  . Chronic knee pain (Bilateral) (L>R) 10/30/2017  . PCP NOTES >>>>>> 08/31/2016  . Osteoarthritis of hips (Bilateral) 03/21/2016  . Morbid obesity with body mass index (BMI) of 40.0 to 44.9 in adult [facility]) 03/21/2016  . Herpes zoster 09/28/2015  . Encounter for therapeutic drug level monitoring 08/18/2015  . Annual physical exam 08/18/2015  . Chronic pain syndrome 05/20/2015  . Lumbar spondylosis 05/20/2015  . Myofascial pain 05/20/2015  . Fibromyalgia 05/20/2015  . Osteoarthrosis 05/20/2015  . Leg cramps 02/01/2015  . Vitamin D deficiency 11/03/2008  . Dyslipidemia 07/01/2007  . Depression with anxiety 07/01/2007  . Essential hypertension 07/01/2007  . ALLERGIC RHINITIS 07/01/2007  . CLIMACTERIC STATE, FEMALE 07/01/2007    Current Outpatient Medications  Medication Sig Dispense Refill  . aspirin EC 81 MG tablet Take 81 mg by mouth daily.    [name] atorvastatin (LIPITOR) 10 MG tablet Take 1 tablet (10 mg total) by mouth at bedtime. 90 tablet 1  . Cholecalciferol (VITAMIN D) 2000 UNITS tablet Take 2,000 Units by mouth daily.    . cyclobenzaprine (FLEXERIL) 10 MG tablet Take 1 tablet (10 mg total) by mouth at bedtime. 30 tablet 2  . gabapentin (NEURONTIN) 300 MG capsule Take 1 capsule (300 mg total) by mouth 3 (three) times daily. 90 capsule 2  . hydrochlorothiazide (HYDRODIURIL) 25 MG tablet Take 1 tablet (25 mg total) by mouth daily. 90 tablet 1  . [START ON 04/01/2018] HYDROcodone-acetaminophen (NORCO/VICODIN) 5-325 MG tablet Take 1 tablet by mouth every 6 (six) hours as needed for severe pain. 120 tablet 0  . [START ON 03/02/2018] HYDROcodone-acetaminophen (NORCO/VICODIN) 5-325 MG tablet Take 1 tablet by mouth every 6 (six) hours as needed for severe pain. 120 tablet 0  . HYDROcodone-acetaminophen  (NORCO/VICODIN) 5-325 MG tablet Take 1 tablet by mouth every 6 (six) hours as needed for moderate pain. 120 tablet 0  . lisinopril (PRINIVIL,ZESTRIL) 10 MG tablet Take 1 tablet (10 mg total) by mouth daily. 30 tablet 6  . sertraline (ZOLOFT) 50 MG tablet Take 3 tablets (150 mg total) by mouth daily. 90 tablet 6  . cephALEXin (KEFLEX) 500 MG capsule Take 1 capsule (500 mg total) by mouth 3 (three) times daily for 7 days. 20 capsule 0  . traMADol (ULTRAM) 50 MG tablet Take 1 tablet (50 mg total) by mouth every 6 (six) hours as needed. 12 tablet 0   No current facility-administered medications for this visit.     Allergies: Simvastatin  Past Medical History:  Diagnosis Date  . Allergy    RHINITIS  . Chronic back pain   . Depression   . DJD (degenerative joint disease)    back pain , on disability  . Hyperlipidemia   . Hypertension   . Menopause    onset age 38   . Musculoskeletal pain 12/07/2015  . TMJ PAIN 05/17/2010   Qualifier: Diagnosis of  By: Larose Kells MD, [place]     Past Surgical History:  Procedure Laterality Date  . G3 P3       Family History  Problem Relation Age of Onset  . Cancer Sister        ??CERVICAL  . Cancer Brother        STOMACH  . Breast cancer Sister        sister dx  age 41, aunt, cousin x 2   . Colon cancer Brother        dx age ~ 71 ?  [name] Diabetes Unknown        aunt   . Coronary artery disease Neg Hx   . CAD Neg Hx     Social History   Tobacco Use  . Smoking status: Never Smoker  . Smokeless tobacco: Never Used  Substance Use Topics  . Alcohol use: No    Subjective:  Patient presents with concerns for worsening right flank pain; started last Wednesday but unable to determine obvious source of pain; notes that started as "dull pain" and progressively worsening; no prior history of kidney stones; does still have appendix; does have pain management- has been taking her Norco with minimal benefit; does ask about getting a Toradol shot today; no  increased urinary frequency; no blood in the urine; decreased appetite due to the pain; has "felt feverish"; no nausea, vomiting; normal bowel movements; appears visibly uncomfortable in office/ seen in wheelchair;   Objective:  Vitals:   02/11/18 1319  BP: (!) 148/88  Pulse: 89  Temp: 98.2 F (36.8 C)  TempSrc: Oral  SpO2: 97%  Height: 5\' 4"  (1.626 m)    General: Well developed, well nourished, in mild distress  Skin : Warm and dry.  Head: Normocephalic and atraumatic  Lungs: Respirations unlabored; clear to auscultation bilaterally without wheeze, rales, rhonchi  CVS exam: normal rate and regular rhythm.  Abdomen: Soft; nontender; nondistended; normoactive bowel sounds; no masses or hepatosplenomegaly  Musculoskeletal: No deformities; no active joint inflammation  Extremities: No edema, cyanosis, clubbing  Vessels: Symmetric bilaterally  Neurologic: Alert and oriented; speech intact; face symmetrical; uses wheelchair; CNII-XII intact without focal deficit  Assessment:  1. Right flank pain     Plan:  U/A in office does show blood; due to severity of pain and worsening of patient's symptoms, question kidney stone; unfortunately, not able to get a CT done this afternoon; recommended ER evaluation and patient/ daughter in agreement; she will follow-up with her PCP as needed.    No follow-ups on file.  Orders Placed This Encounter  Procedures  . Urinalysis    Standing Status:   Future    Standing Expiration Date:   02/11/2019  . POCT Urinalysis Dipstick (Automated)    Requested Prescriptions    No prescriptions requested or ordered in this encounter

## 2018-02-11 NOTE — ED Triage Notes (Signed)
Pt c/o right lower back pain increased with movt x 4 days

## 2018-02-11 NOTE — Patient Instructions (Signed)
Due to the amount of pain you are experiencing, please go to the ER;

## 2018-02-13 ENCOUNTER — Encounter: Payer: Self-pay | Admitting: Nurse Practitioner

## 2018-02-13 ENCOUNTER — Telehealth: Payer: Self-pay | Admitting: Pain Medicine

## 2018-02-13 ENCOUNTER — Ambulatory Visit: Payer: Medicare HMO | Attending: Nurse Practitioner | Admitting: Nurse Practitioner

## 2018-02-13 VITALS — BP 139/97 | HR 73 | Temp 97.8°F | Resp 16 | Ht 65.0 in | Wt 245.0 lb

## 2018-02-13 DIAGNOSIS — N9489 Other specified conditions associated with female genital organs and menstrual cycle: Secondary | ICD-10-CM | POA: Diagnosis not present

## 2018-02-13 DIAGNOSIS — M25562 Pain in left knee: Secondary | ICD-10-CM | POA: Insufficient documentation

## 2018-02-13 DIAGNOSIS — M150 Primary generalized (osteo)arthritis: Secondary | ICD-10-CM | POA: Diagnosis not present

## 2018-02-13 DIAGNOSIS — E785 Hyperlipidemia, unspecified: Secondary | ICD-10-CM | POA: Insufficient documentation

## 2018-02-13 DIAGNOSIS — M5125 Other intervertebral disc displacement, thoracolumbar region: Secondary | ICD-10-CM | POA: Diagnosis not present

## 2018-02-13 DIAGNOSIS — M5126 Other intervertebral disc displacement, lumbar region: Secondary | ICD-10-CM | POA: Diagnosis not present

## 2018-02-13 DIAGNOSIS — M159 Polyosteoarthritis, unspecified: Secondary | ICD-10-CM

## 2018-02-13 DIAGNOSIS — M170 Bilateral primary osteoarthritis of knee: Secondary | ICD-10-CM | POA: Insufficient documentation

## 2018-02-13 DIAGNOSIS — G894 Chronic pain syndrome: Secondary | ICD-10-CM | POA: Insufficient documentation

## 2018-02-13 DIAGNOSIS — Z803 Family history of malignant neoplasm of breast: Secondary | ICD-10-CM | POA: Insufficient documentation

## 2018-02-13 DIAGNOSIS — M4316 Spondylolisthesis, lumbar region: Secondary | ICD-10-CM | POA: Insufficient documentation

## 2018-02-13 DIAGNOSIS — E559 Vitamin D deficiency, unspecified: Secondary | ICD-10-CM | POA: Diagnosis not present

## 2018-02-13 DIAGNOSIS — Z5181 Encounter for therapeutic drug level monitoring: Secondary | ICD-10-CM | POA: Insufficient documentation

## 2018-02-13 DIAGNOSIS — I10 Essential (primary) hypertension: Secondary | ICD-10-CM | POA: Diagnosis not present

## 2018-02-13 DIAGNOSIS — Z780 Asymptomatic menopausal state: Secondary | ICD-10-CM | POA: Diagnosis not present

## 2018-02-13 DIAGNOSIS — B029 Zoster without complications: Secondary | ICD-10-CM | POA: Diagnosis not present

## 2018-02-13 DIAGNOSIS — Z7982 Long term (current) use of aspirin: Secondary | ICD-10-CM | POA: Diagnosis not present

## 2018-02-13 DIAGNOSIS — M25561 Pain in right knee: Secondary | ICD-10-CM | POA: Insufficient documentation

## 2018-02-13 DIAGNOSIS — Z6841 Body Mass Index (BMI) 40.0 and over, adult: Secondary | ICD-10-CM | POA: Insufficient documentation

## 2018-02-13 DIAGNOSIS — F418 Other specified anxiety disorders: Secondary | ICD-10-CM | POA: Insufficient documentation

## 2018-02-13 DIAGNOSIS — Z79891 Long term (current) use of opiate analgesic: Secondary | ICD-10-CM | POA: Insufficient documentation

## 2018-02-13 DIAGNOSIS — M47816 Spondylosis without myelopathy or radiculopathy, lumbar region: Secondary | ICD-10-CM | POA: Diagnosis not present

## 2018-02-13 DIAGNOSIS — J309 Allergic rhinitis, unspecified: Secondary | ICD-10-CM | POA: Diagnosis not present

## 2018-02-13 DIAGNOSIS — Z79899 Other long term (current) drug therapy: Secondary | ICD-10-CM | POA: Diagnosis not present

## 2018-02-13 DIAGNOSIS — M160 Bilateral primary osteoarthritis of hip: Secondary | ICD-10-CM | POA: Diagnosis not present

## 2018-02-13 DIAGNOSIS — M1991 Primary osteoarthritis, unspecified site: Secondary | ICD-10-CM | POA: Diagnosis not present

## 2018-02-13 DIAGNOSIS — M7918 Myalgia, other site: Secondary | ICD-10-CM | POA: Diagnosis not present

## 2018-02-13 DIAGNOSIS — M47819 Spondylosis without myelopathy or radiculopathy, site unspecified: Secondary | ICD-10-CM | POA: Diagnosis not present

## 2018-02-13 DIAGNOSIS — Z800 Family history of malignant neoplasm of digestive organs: Secondary | ICD-10-CM | POA: Insufficient documentation

## 2018-02-13 DIAGNOSIS — Z833 Family history of diabetes mellitus: Secondary | ICD-10-CM | POA: Insufficient documentation

## 2018-02-13 DIAGNOSIS — M545 Low back pain: Secondary | ICD-10-CM | POA: Diagnosis not present

## 2018-02-13 MED ORDER — KETOROLAC TROMETHAMINE 60 MG/2ML IM SOLN
60.0000 mg | Freq: Once | INTRAMUSCULAR | Status: AC
  Administered 2018-02-13: 60 mg via INTRAMUSCULAR
  Filled 2018-02-13: qty 2

## 2018-02-13 MED ORDER — ORPHENADRINE CITRATE 30 MG/ML IJ SOLN
60.0000 mg | Freq: Once | INTRAMUSCULAR | Status: AC
  Administered 2018-02-13: 60 mg via INTRAMUSCULAR
  Filled 2018-02-13: qty 2

## 2018-02-13 NOTE — Telephone Encounter (Signed)
This maybe acute UTI Per MRI 2017 several areas of impingement We can give her a toradol/noroflex and update images  She can in today

## 2018-02-13 NOTE — Progress Notes (Signed)
Safety precautions to be maintained throughout the outpatient stay will include: orient to surroundings, keep bed in low position, maintain call bell within reach at all times, provide assistance with transfer out of bed and ambulation.  

## 2018-02-13 NOTE — Telephone Encounter (Signed)
Called patient and she is coming in for injection with CKing

## 2018-02-13 NOTE — Telephone Encounter (Signed)
Daughter Phineas Real calling for Wanda [name] (patient) says patient feels like she pulled something in her back and went to ED on Monday for this, where she received Tramadol and anti-biotic due to some blood in her urine, she say GYN today, was told he feels this is from her back not her cysts. Patient would like to come in today and be evaluated by Dr. Dossie Arbour. Can she be added to schedule ? Please let patient know asap

## 2018-02-13 NOTE — Progress Notes (Signed)
Patient's Name: Wanda [name]  MRN: [id number]  Referring Provider: Colon Branch, MD  DOB: March 24, 1946  PCP: Colon Branch, MD  DOS: 02/13/2018  Note by: Vevelyn Francois NP  Service setting: Ambulatory outpatient  Specialty: Interventional Pain Management  Location: [facility] (AMB) Pain Management Facility    Patient type: Established    Primary Reason(s) for Visit: Evaluation of chronic illnesses with exacerbation, or progression (Level of risk: moderate) CC: Back Pain (lower right)  HPI  Wanda [name] is a 72 y.o. year old, female patient, who comes today for a follow-up evaluation. She has Vitamin D deficiency; Dyslipidemia; Depression with anxiety; Essential hypertension; ALLERGIC RHINITIS; CLIMACTERIC STATE, FEMALE; Leg cramps; Chronic pain syndrome; Lumbar spondylosis; Myofascial pain; Fibromyalgia; Osteoarthrosis; Encounter for therapeutic drug level monitoring; Annual physical exam; Herpes zoster; Osteoarthritis of hips (Bilateral); Morbid obesity with body mass index (BMI) of 40.0 to 44.9 in adult [facility]); PCP NOTES >>>>>>; Chronic knee pain (Bilateral) (L>R); and Osteoarthritis of knee (Bilateral) on their problem list. Wanda [name] was last seen on 01/22/2018. Her primarily concern today is the Back Pain (lower right)  Pain Assessment: Location: Lower, Right Back Radiating: denies Onset: More than a month ago Duration: Chronic pain Quality: Discomfort, Burning, Aching, Constant Severity: 7 /10 (subjective, self-reported pain score)  Note: Reported level is compatible with observation. Clinically the patient looks like a 3/10 A 3/10 is viewed as "Moderate" and described as significantly interfering with activities of daily living (ADL). It becomes difficult to feed, bathe, get dressed, get on and off the toilet or to perform personal hygiene functions. Difficult to get in and out of bed or a chair without assistance. Very distracting. With effort, it can be ignored when deeply involved in activities.        When using our objective Pain Scale, levels between 6 and 10/10 are said to belong in an emergency room, as it progressively worsens from a 6/10, described as severely limiting, requiring emergency care not usually available at an outpatient pain management facility. At a 6/10 level, communication becomes difficult and requires great effort. Assistance to reach the emergency department may be required. Facial flushing and profuse sweating along with potentially dangerous increases in heart rate and blood pressure will be evident. Effect on ADL: moving increases the pain Timing: Constant Modifying factors: hurts all the time nothing is relieving at present  BP: (!) 139/97  HR: 73  Further details on both, my assessment(s), as well as the proposed treatment plan, please see below. She is in today for a flare up her low back pain. She denies any injury or fall. She does have to care for her husband with dementia. She was seen in the ER and given Tramadol and treated for UTI. Her previous MRI 2017 shows Severe facet arthropathy from L3-4 to L5-S1 with grade 1 anterolisthesis at L4-5 and L5-S1. She denies any leg pain.   Laboratory Chemistry  Inflammation Markers (CRP: Acute Phase) (ESR: Chronic Phase) Lab Results  Component Value Date   CRP <0.5 08/18/2015   ESRSEDRATE 28 08/18/2015                         Rheumatology Markers No results found for: RF, ANA, LABURIC, URICUR, LYMEIGGIGMAB, LYMEABIGMQN, HLAB27                      Renal Function Markers Lab Results  Component Value Date   BUN 12 12/24/2017  CREATININE 0.61 12/24/2017   GFRAA >60 08/18/2015   GFRNONAA >60 08/18/2015                             Hepatic Function Markers Lab Results  Component Value Date   AST 19 12/24/2017   ALT 18 12/24/2017   ALBUMIN 4.0 12/24/2017   ALKPHOS 73 12/24/2017   HCVAB NEGATIVE 08/30/2016                        Electrolytes Lab Results  Component Value Date   NA 137 12/24/2017    K 4.4 12/24/2017   CL 102 12/24/2017   CALCIUM 9.5 12/24/2017   MG 1.8 12/24/2017                        Neuropathy Markers No results found for: Archie Balboa, HGBA1C, HIV                      Bone Pathology Markers Lab Results  Component Value Date   VD25OH 37 05/01/2011   VD125OH2TOT 68 12/24/2017   [id number] 68 12/24/2017   [id number] <8 12/24/2017                         Coagulation Parameters Lab Results  Component Value Date   PLT 174.0 12/24/2017                        Cardiovascular Markers Lab Results  Component Value Date   HGB 13.0 12/24/2017   HCT 38.9 12/24/2017                         CA Markers No results found for: CEA, CA125, LABCA2                      Note: Lab results reviewed.  Recent Diagnostic Imaging Review   Lumbosacral Imaging: Lumbar MR wo contrast:  Results for orders placed during the hospital encounter of 02/20/16  MR Lumbar Spine Wo Contrast   Narrative CLINICAL DATA:  Chronic low back pain. Left gluteal pain and lower leg numbness for 20 years. Evaluation prior to spinal injection. EXAM: MRI LUMBAR SPINE WITHOUT CONTRAST TECHNIQUE: Multiplanar, multisequence MR imaging of the lumbar spine was performed. No intravenous contrast was administered. COMPARISON:  Single sagittal T1 acquisition 08/30/2015. FINDINGS: Segmentation: Nonstandard. Based on the lowest ribs on radiography 11/15/2012 there are 6 lumbar type vertebral bodies, the lowest numbered S1. This gives an open disc space at S1-S2. Ribs are not clearly visible on this exam. Alignment: L4-5 and L5-S1 grade 1 anterolisthesis from facet arthropathy. Vertebrae: No fracture, evidence of discitis, or bone lesion. A benign L2 hemangioma is noted. Conus medullaris: Extends to the L1 level and appears normal. Paraspinal and other soft tissues: At least 24 mm cyst in the left pelvis, likely ovarian. Given partial visualization sonographic workup is recommended in this  postmenopausal patient. Disc levels: T12- L1: Ventral spondylotic spurring. Right foraminal to paracentral annular fissure and disc protrusion, only seen on sagittal acquisition, with foraminal fat effacement. L1-L2: Right foraminal disc protrusion with mild impingement, chronic. Ventral spondylosis. Negative facets. L2-L3: Small left foraminal protrusion which contacts the L2 nerve, chronic. Facet hypertrophy with asymmetric degenerative spurring on the right. L3-L4: Disc bulging with superimposed broad right  paracentral to foraminal disc protrusion most convincing on sagittal acquisition. Facet hypertrophy with advanced spurring. No suspected impingement. L4-L5: Bulging of the uncovered disc greatest in the right foraminal region. Advanced facet arthropathy with spurring and anterolisthesis. No suspected impingement. L5-S1:Bulging of the uncovered disc. Advanced facet arthropathy with bulky spurring and anterolisthesis. No suspected impingement. S1-S2: Disc narrowing with bulky left far-lateral spurring that does not convincingly compress the S1 nerve. Mild degenerative facet spurring. No suspected impingement. IMPRESSION: 1. 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 The lowest disc level is numbered S1-S2. Numbering scheme differs from 11/25/2012 MRI. 2. Severe facet arthropathy from L3-4 to L5-S1 with grade 1 anterolisthesis at L4-5 and L5-S1. 3. Chronic T12-L1 right foraminal protrusion with T12 impingement. 4. Chronic L1-2 right foraminal protrusion with mild impingement. 5. Chronic L2-3 left foraminal protrusion with mild impingement. 6. 24 mm left pelvic cyst, likely ovarian. Given partial visualization nonemergent sonography is recommended. Electronically Signed   By: Monte Fantasia M.D.   On: 02/20/2016 18:38   Hip Imaging:  Hip-R DG 2-3 views:  Results for orders placed during the hospital encounter of 03/22/16  DG HIP  UNILAT W OR W/O PELVIS 2-3 VIEWS RIGHT   Narrative CLINICAL DATA:  Chronic pain  EXAM: PELVIS AND BILATERAL HIPS:  4+ VIEWS  COMPARISON:  None.  FINDINGS: Standing frontal pelvis as well as standing frontal and lateral views of each hip-total five views -obtained. There is no fracture or dislocation. The joint spaces appear normal. No erosive change.  IMPRESSION: No fracture or dislocation.  No apparent arthropathy.   Electronically Signed   By: Lowella Grip III M.D.   On: 03/22/2016 11:23    Hip-L DG 2-3 views:  Results for orders placed during the hospital encounter of 03/22/16  DG HIP UNILAT W OR W/O PELVIS 2-3 VIEWS LEFT   Narrative CLINICAL DATA:  Chronic pain  EXAM: PELVIS AND BILATERAL HIPS:  4+ VIEWS  COMPARISON:  None.  FINDINGS: Standing frontal pelvis as well as standing frontal and lateral views of each hip-total five views -obtained. There is no fracture or dislocation. The joint spaces appear normal. No erosive change.  IMPRESSION: No fracture or dislocation.  No apparent arthropathy.   Electronically Signed   By: Lowella Grip III M.D.   On: 03/22/2016 11:23    results found for this or any previous visit.  Complexity Note: Imaging results reviewed. Results shared with Wanda [name], using Layman's terms.                         Meds   Current Outpatient Medications:  .  aspirin EC 81 MG tablet, Take 81 mg by mouth daily., Disp: , Rfl:  .  atorvastatin (LIPITOR) 10 MG tablet, Take 1 tablet (10 mg total) by mouth at bedtime., Disp: 90 tablet, Rfl: 1 .  cephALEXin (KEFLEX) 500 MG capsule, Take 1 capsule (500 mg total) by mouth 3 (three) times daily for 7 days., Disp: 20 capsule, Rfl: 0 .  Cholecalciferol (VITAMIN D) 2000 UNITS tablet, Take 2,000 Units by mouth daily., Disp: , Rfl:  .  cyclobenzaprine (FLEXERIL) 10 MG tablet, Take 1 tablet (10 mg total) by mouth at bedtime., Disp: 30 tablet, Rfl: 2 .  gabapentin (NEURONTIN) 300 MG capsule,  Take 1 capsule (300 mg total) by mouth 3 (three) times daily., Disp: 90 capsule, Rfl: 2 .  hydrochlorothiazide (HYDRODIURIL) 25 MG tablet, Take 1 tablet (25  mg total) by mouth daily., Disp: 90 tablet, Rfl: 1 .  [START ON 04/01/2018] HYDROcodone-acetaminophen (NORCO/VICODIN) 5-325 MG tablet, Take 1 tablet by mouth every 6 (six) hours as needed for severe pain., Disp: 120 tablet, Rfl: 0 .  [START ON 03/02/2018] HYDROcodone-acetaminophen (NORCO/VICODIN) 5-325 MG tablet, Take 1 tablet by mouth every 6 (six) hours as needed for severe pain., Disp: 120 tablet, Rfl: 0 .  HYDROcodone-acetaminophen (NORCO/VICODIN) 5-325 MG tablet, Take 1 tablet by mouth every 6 (six) hours as needed for moderate pain., Disp: 120 tablet, Rfl: 0 .  lisinopril (PRINIVIL,ZESTRIL) 10 MG tablet, Take 1 tablet (10 mg total) by mouth daily., Disp: 30 tablet, Rfl: 6 .  sertraline (ZOLOFT) 50 MG tablet, Take 3 tablets (150 mg total) by mouth daily., Disp: 90 tablet, Rfl: 6 .  traMADol (ULTRAM) 50 MG tablet, Take 1 tablet (50 mg total) by mouth every 6 (six) hours as needed., Disp: 12 tablet, Rfl: 0  ROS  Constitutional: Denies any fever or chills Gastrointestinal: No reported hemesis, hematochezia, vomiting, or acute GI distress Musculoskeletal: Denies any acute onset joint swelling, redness, loss of ROM, or weakness Neurological: No reported episodes of acute onset apraxia, aphasia, dysarthria, agnosia, amnesia, paralysis, loss of coordination, or loss of consciousness  Allergies  Wanda [name] is allergic to simvastatin.  [place]  Drug: Wanda [name]  reports that she does not use drugs. Alcohol:  reports that she does not drink alcohol. Tobacco:  reports that she has never smoked. She has never used smokeless tobacco. Medical:  has a past medical history of Allergy, Chronic back pain, Depression, DJD (degenerative joint disease), Hyperlipidemia, Hypertension, Menopause, Musculoskeletal pain (12/07/2015), and TMJ PAIN  (05/17/2010). Surgical: Wanda [name]  has a past surgical history that includes G3 P3 . Family: family history includes Breast cancer in her sister; Cancer in her brother and sister; Colon cancer in her brother; Diabetes in her unknown relative.  Constitutional Exam  General appearance: Well nourished, well developed, and well hydrated. In no apparent acute distress Vitals:   02/13/18 1440  BP: (!) 139/97  Pulse: 73  Resp: 16  Temp: 97.8 F (36.6 C)  TempSrc: Oral  SpO2: 99%  Weight: 245 lb (111.1 kg)  Height: _0  (1.651 m)   BMI Assessment: Estimated body mass index is 40.77 kg/m as calculated from the following:   Height as of this encounter: _1  (1.651 m).   Weight as of this encounter: 245 lb (111.1 kg). Psych/Mental status: Alert, oriented x 3 (person, place, & time)       Eyes: PERLA Respiratory: No evidence of acute respiratory distress  Lumbar Spine Area Exam  Skin & Axial Inspection: No masses, redness, or swelling Alignment: Symmetrical Functional ROM: Pain restricted ROM       Stability: No instability detected Muscle Tone/Strength: Increased muscle tone over affected area Sensory (Neurological): Unimpaired Palpation: Complains of area being tender to palpation       Provocative Tests: Lumbar Hyperextension/rotation test: (+) on the right for facet joint pain. Lumbar quadrant test (Kemp's test): deferred today       Lumbar Lateral bending test: deferred today       Patrick's Maneuver: deferred today                   FABER test: deferred today                   Thigh-thrust test: deferred today       S-I compression test: deferred  today       S-I distraction test: deferred today        Gait & Posture Assessment  Ambulation: Unassisted Gait: Antalgic Posture: Lumbar lordosis   Lower Extremity Exam    Side: Right lower extremity  Side: Left lower extremity  Stability: No instability observed          Stability: No instability observed          Skin &  Extremity Inspection: Skin color, temperature, and hair growth are WNL. No peripheral edema or cyanosis. No masses, redness, swelling, asymmetry, or associated skin lesions. No contractures.  Skin & Extremity Inspection: Skin color, temperature, and hair growth are WNL. No peripheral edema or cyanosis. No masses, redness, swelling, asymmetry, or associated skin lesions. No contractures.  Functional ROM: Unrestricted ROM                  Functional ROM: Unrestricted ROM                  Muscle Tone/Strength: Functionally intact. No obvious neuro-muscular anomalies detected.  Muscle Tone/Strength: Functionally intact. No obvious neuro-muscular anomalies detected.  Sensory (Neurological): Unimpaired  Sensory (Neurological): Unimpaired  Palpation: No palpable anomalies  Palpation: No palpable anomalies   Assessment  Primary Diagnosis & Pertinent Problem List: The primary encounter diagnosis was Lumbar spondylosis. Diagnoses of Myofascial pain, Primary osteoarthritis involving multiple joints, and Chronic pain syndrome were also pertinent to this visit.  Status Diagnosis  Having a Flare-up Having a Flare-up Persistent 1. Lumbar spondylosis   2. Myofascial pain   3. Primary osteoarthritis involving multiple joints   4. Chronic pain syndrome     Problems updated and reviewed during this visit: No problems updated. Plan of Care  Pharmacotherapy (Medications Ordered): Meds ordered this encounter  Medications  . orphenadrine (NORFLEX) injection 60 mg  . ketorolac (TORADOL) injection 60 mg   New Prescriptions   No medications on file   Medications administered today: We administered orphenadrine and ketorolac. Lab-work, procedure(s), and/or referral(s): Orders Placed This Encounter  Procedures  . MR LUMBAR SPINE WO CONTRAST   Imaging and/or referral(s): MR LUMBAR SPINE WO CONTRAST  Interventional therapies: Planned, scheduled, and/or pending: None at this time    Considering:   Diagnostic/palliative left intra-articular knee injection.  Diagnostic bilateral lumbar facet block Left L4-5 lumbar epiduralsteroid injection   Palliative PRN treatment(s):  Palliative bilateral lumbar facet block Left L4-5 lumbar epiduralsteroid injection Palliative left intra-articular knee injection   Provider-requested follow-up: Return for Appointment As Scheduled, MRI.  Future Appointments  Date Time Provider [place]  04/24/2018 10:30 AM Vevelyn Francois, NP [facility] None   Primary Care Physician: Colon Branch, MD Location: [facility] Note by: Vevelyn Francois NP Date: 02/13/2018; Time: 4:18 PM  Pain Score Disclaimer: We use the NRS-11 scale. This is a self-reported, subjective measurement of pain severity with only modest accuracy. It is used primarily to identify changes within a particular patient. It must be understood that outpatient pain scales are significantly less accurate that those used for research, where they can be applied under ideal controlled circumstances with minimal exposure to variables. In reality, the score is likely to be a combination of pain intensity and pain affect, where pain affect describes the degree of emotional arousal or changes in action readiness caused by the sensory experience of pain. Factors such as social and work situation, setting, emotional state, anxiety levels, expectation, and prior pain experience may influence pain  perception and show large inter-individual differences that may also be affected by time variables.  Patient instructions provided during this appointment: Patient Instructions

## 2018-02-13 NOTE — Telephone Encounter (Signed)
Asked Juliann Pulse to call patient with option to come in for toradol/norflex injection.

## 2018-02-20 ENCOUNTER — Ambulatory Visit (INDEPENDENT_AMBULATORY_CARE_PROVIDER_SITE_OTHER): Payer: Medicare HMO | Admitting: Internal Medicine

## 2018-02-20 ENCOUNTER — Encounter: Payer: Self-pay | Admitting: Internal Medicine

## 2018-02-20 VITALS — BP 128/68 | HR 78 | Temp 97.5°F | Resp 16 | Ht 65.0 in | Wt 246.0 lb

## 2018-02-20 DIAGNOSIS — R109 Unspecified abdominal pain: Secondary | ICD-10-CM | POA: Diagnosis not present

## 2018-02-20 DIAGNOSIS — N83209 Unspecified ovarian cyst, unspecified side: Secondary | ICD-10-CM | POA: Diagnosis not present

## 2018-02-20 DIAGNOSIS — R8271 Bacteriuria: Secondary | ICD-10-CM

## 2018-02-20 DIAGNOSIS — R10A0 Flank pain, unspecified side: Secondary | ICD-10-CM

## 2018-02-20 NOTE — Patient Instructions (Signed)
   GO TO THE FRONT DESK Schedule your next appointment for a  Check up in 6 months  

## 2018-02-20 NOTE — Progress Notes (Signed)
Subjective:    Patient ID: Wanda [name], female    DOB: October 20, 1945, 72 y.o.   MRN: [id number]  DOS:  02/20/2018 Type of visit - description : ER follow-up Interval history: Patient developed R flank pain around 02/07/2018, was seen at [facility] 02/11/18, UA show some blood, was referred to the ER,  CT showed  renal no obstructing renal stones. She had a small supraumbilical hernia. Also left ovarian cyst, ultrasound was recommended. A urinalysis showed bacteriuria, no culture done. Here for follow-up  Review of Systems Since the ER visit, saw her pain management doctor for the flank pain, apparently got a IM injection (steroids?). She is also taking ibuprofen to 3 times a day Overall much improved. She tells me that she was prescribed antibiotics for possible UTI, she just finishes them.  Wonders if she needs more antibiotics. Denies fever chills No dysuria or gross hematuria No nausea or vomiting.   Past Medical History:  Diagnosis Date  . Allergy    RHINITIS  . Chronic back pain   . Depression   . DJD (degenerative joint disease)    back pain , on disability  . Hyperlipidemia   . Hypertension   . Menopause    onset age 30   . Musculoskeletal pain 12/07/2015  . TMJ PAIN 05/17/2010   Qualifier: Diagnosis of  By: Larose Kells MD, [place]     Past Surgical History:  Procedure Laterality Date  . G3 P3       Social History   Socioeconomic History  . Marital status: Married    Spouse name: Not on file  . Number of children: 3  . Years of education: Not on file  . Highest education level: Not on file  Occupational History  . Occupation: [employer] DIS[employer]  Social Needs  . Financial resource strain: Not on file  . Food insecurity:    Worry: Not on file    Inability: Not on file  . Transportation needs:    Medical: Not on file    Non-medical: Not on file  Tobacco Use  . Smoking status: Never Smoker  . Smokeless tobacco: Never Used  Substance and Sexual Activity  .  Alcohol use: No  . Drug use: No  . Sexual activity: Not on file  Lifestyle  . Physical activity:    Days per week: Not on file    Minutes per session: Not on file  . Stress: Not on file  Relationships  . Social connections:    Talks on phone: Not on file    Gets together: Not on file    Attends religious service: Not on file    Active member of club or organization: Not on file    Attends meetings of clubs or organizations: Not on file    Relationship status: Not on file  . Intimate partner violence:    Fear of current or ex partner: Not on file    Emotionally abused: Not on file    Physically abused: Not on file    Forced sexual activity: Not on file  Other Topics Concern  . Not on file  Social History Narrative   Married, 3 kids, 6 G-kids    Household-- pt, husband, son      Allergies as of 02/20/2018      Reactions   Simvastatin    REACTION: cramps      Medication List        Accurate as of 02/20/18  8:30 AM. Always  use your most recent med list.          aspirin EC 81 MG tablet Take 81 mg by mouth daily.   atorvastatin 10 MG tablet Commonly known as:  LIPITOR Take 1 tablet (10 mg total) by mouth at bedtime.   cyclobenzaprine 10 MG tablet Commonly known as:  FLEXERIL Take 1 tablet (10 mg total) by mouth at bedtime.   gabapentin 300 MG capsule Commonly known as:  NEURONTIN Take 1 capsule (300 mg total) by mouth 3 (three) times daily.   hydrochlorothiazide 25 MG tablet Commonly known as:  HYDRODIURIL Take 1 tablet (25 mg total) by mouth daily.   HYDROcodone-acetaminophen 5-325 MG tablet Commonly known as:  NORCO/VICODIN Take 1 tablet by mouth every 6 (six) hours as needed for moderate pain.   HYDROcodone-acetaminophen 5-325 MG tablet Commonly known as:  NORCO/VICODIN Take 1 tablet by mouth every 6 (six) hours as needed for severe pain. Start taking on:  03/02/2018   HYDROcodone-acetaminophen 5-325 MG tablet Commonly known as:  NORCO/VICODIN Take  1 tablet by mouth every 6 (six) hours as needed for severe pain. Start taking on:  04/01/2018   lisinopril 10 MG tablet Commonly known as:  PRINIVIL,ZESTRIL Take 1 tablet (10 mg total) by mouth daily.   sertraline 50 MG tablet Commonly known as:  ZOLOFT Take 3 tablets (150 mg total) by mouth daily.   traMADol 50 MG tablet Commonly known as:  ULTRAM Take 1 tablet (50 mg total) by mouth every 6 (six) hours as needed.   Vitamin D 2000 units tablet Take 2,000 Units by mouth daily.          Objective:   Physical Exam BP 128/68 (BP Location: Left Arm, Patient Position: Sitting, Cuff Size: Normal)   Pulse 78   Temp (!) 97.5 F (36.4 C) (Oral)   Resp 16   Ht 5\' 5"  (1.651 m)   Wt 246 lb (111.6 kg)   SpO2 97%   BMI 40.94 kg/m   General:   Well developed, NAD, see BMI.  HEENT:  Normocephalic . Face symmetric, atraumatic Lungs:  CTA B Normal respiratory effort, no intercostal retractions, no accessory muscle use. Heart: RRR,  no murmur.  no pretibial edema bilaterally  Abdomen:  Not distended, soft, non-tender. No rebound or rigidity.    MSK: No TTP at the back or flanks Skin: Not pale. Not jaundice Neurologic:  alert & oriented X3.  Speech normal, gait appropriate for age and unassisted Psych--  Cognition and judgment appear intact.  Cooperative with normal attention span and concentration.  Behavior appropriate. No anxious or depressed appearing.     Assessment & Plan:    Assessment HTN Hyperlipidemia Depression Morbid obesity MSK: DJD, chronic back-knee pain, hydrocodone /gabapentin rx by DR Dossie Arbour  PLAN: Right flank pain: Resolved, CT negative for acute findings.  Possibly MSK Bacteriuria: UA at the emergency room showed bacteria, UCX not done to my knowledge, but she was prescribed antibiotics, I see in the chart that she was prescribed cephalexin.  At this point she is completely asymptomatic, no further antibiotics are needed. Ovarian cyst per CT:  Already evaluated by gynecology, no further testing was recommended. DJD, chronic back and knee pain: Was prescribed Ultram and the ER, recommend to stop it and continue with her routine hydrocodone.  Also okay to take ibuprofen as needed, few times a week. RTC 6 months  F2F 25 min/ chart review

## 2018-02-20 NOTE — Progress Notes (Signed)
Pre visit review using our clinic review tool, if applicable. No additional management support is needed unless otherwise documented below in the visit note. 

## 2018-02-20 NOTE — Assessment & Plan Note (Signed)
Right flank pain: Resolved, CT negative for acute findings.  Possibly MSK Bacteriuria: UA at the emergency room showed bacteria, UCX not done to my knowledge, but she was prescribed antibiotics, I see in the chart that she was prescribed cephalexin.  At this point she is completely asymptomatic, no further antibiotics are needed. Ovarian cyst per CT: Already evaluated by gynecology, no further testing was recommended. DJD, chronic back and knee pain: Was prescribed Ultram and the ER, recommend to stop it and continue with her routine hydrocodone.  Also okay to take ibuprofen as needed, few times a week. RTC 6 months

## 2018-03-05 ENCOUNTER — Ambulatory Visit: Payer: Medicare HMO

## 2018-03-20 ENCOUNTER — Other Ambulatory Visit: Payer: Self-pay | Admitting: Nurse Practitioner

## 2018-03-20 ENCOUNTER — Ambulatory Visit
Admission: RE | Admit: 2018-03-20 | Discharge: 2018-03-20 | Disposition: A | Discharge status: Home / Self Care | Payer: Medicare HMO | Source: Ambulatory Visit | Attending: Nurse Practitioner | Admitting: Nurse Practitioner

## 2018-03-20 DIAGNOSIS — M4316 Spondylolisthesis, lumbar region: Secondary | ICD-10-CM | POA: Diagnosis not present

## 2018-03-20 DIAGNOSIS — M5126 Other intervertebral disc displacement, lumbar region: Secondary | ICD-10-CM | POA: Diagnosis not present

## 2018-03-20 DIAGNOSIS — M545 Low back pain: Principal | ICD-10-CM

## 2018-03-20 DIAGNOSIS — M48061 Spinal stenosis, lumbar region without neurogenic claudication: Secondary | ICD-10-CM | POA: Insufficient documentation

## 2018-03-20 DIAGNOSIS — M47816 Spondylosis without myelopathy or radiculopathy, lumbar region: Secondary | ICD-10-CM

## 2018-03-20 DIAGNOSIS — G8929 Other chronic pain: Secondary | ICD-10-CM

## 2018-03-20 DIAGNOSIS — M4317 Spondylolisthesis, lumbosacral region: Secondary | ICD-10-CM | POA: Diagnosis not present

## 2018-03-20 NOTE — Progress Notes (Signed)
Results were reviewed and found to be: abnormal  Further testing recommended  Review would suggest the patient to be a possible candidate for interventional pain management options

## 2018-04-02 ENCOUNTER — Other Ambulatory Visit: Payer: Self-pay | Admitting: Internal Medicine

## 2018-04-24 ENCOUNTER — Encounter: Payer: Self-pay | Admitting: Nurse Practitioner

## 2018-05-16 ENCOUNTER — Encounter: Payer: Self-pay | Admitting: Nurse Practitioner

## 2018-05-16 ENCOUNTER — Other Ambulatory Visit: Payer: Self-pay

## 2018-05-16 ENCOUNTER — Ambulatory Visit: Payer: Medicare HMO | Attending: Nurse Practitioner | Admitting: Nurse Practitioner

## 2018-05-16 VITALS — BP 127/78 | HR 77 | Temp 98.0°F | Ht 63.0 in | Wt 250.0 lb

## 2018-05-16 DIAGNOSIS — Z79899 Other long term (current) drug therapy: Secondary | ICD-10-CM | POA: Diagnosis not present

## 2018-05-16 DIAGNOSIS — B029 Zoster without complications: Secondary | ICD-10-CM | POA: Insufficient documentation

## 2018-05-16 DIAGNOSIS — Z6841 Body Mass Index (BMI) 40.0 and over, adult: Secondary | ICD-10-CM | POA: Insufficient documentation

## 2018-05-16 DIAGNOSIS — M160 Bilateral primary osteoarthritis of hip: Secondary | ICD-10-CM | POA: Insufficient documentation

## 2018-05-16 DIAGNOSIS — I10 Essential (primary) hypertension: Secondary | ICD-10-CM | POA: Insufficient documentation

## 2018-05-16 DIAGNOSIS — J309 Allergic rhinitis, unspecified: Secondary | ICD-10-CM | POA: Diagnosis not present

## 2018-05-16 DIAGNOSIS — M792 Neuralgia and neuritis, unspecified: Secondary | ICD-10-CM | POA: Diagnosis not present

## 2018-05-16 DIAGNOSIS — E559 Vitamin D deficiency, unspecified: Secondary | ICD-10-CM | POA: Insufficient documentation

## 2018-05-16 DIAGNOSIS — M5126 Other intervertebral disc displacement, lumbar region: Secondary | ICD-10-CM | POA: Diagnosis not present

## 2018-05-16 DIAGNOSIS — M48061 Spinal stenosis, lumbar region without neurogenic claudication: Secondary | ICD-10-CM | POA: Diagnosis not present

## 2018-05-16 DIAGNOSIS — M170 Bilateral primary osteoarthritis of knee: Secondary | ICD-10-CM | POA: Diagnosis not present

## 2018-05-16 DIAGNOSIS — M549 Dorsalgia, unspecified: Secondary | ICD-10-CM | POA: Diagnosis present

## 2018-05-16 DIAGNOSIS — F418 Other specified anxiety disorders: Secondary | ICD-10-CM | POA: Insufficient documentation

## 2018-05-16 DIAGNOSIS — Z7982 Long term (current) use of aspirin: Secondary | ICD-10-CM | POA: Diagnosis not present

## 2018-05-16 DIAGNOSIS — M47816 Spondylosis without myelopathy or radiculopathy, lumbar region: Secondary | ICD-10-CM | POA: Insufficient documentation

## 2018-05-16 DIAGNOSIS — M797 Fibromyalgia: Secondary | ICD-10-CM | POA: Diagnosis not present

## 2018-05-16 DIAGNOSIS — M7918 Myalgia, other site: Secondary | ICD-10-CM | POA: Diagnosis not present

## 2018-05-16 DIAGNOSIS — E785 Hyperlipidemia, unspecified: Secondary | ICD-10-CM | POA: Insufficient documentation

## 2018-05-16 DIAGNOSIS — Z79891 Long term (current) use of opiate analgesic: Secondary | ICD-10-CM | POA: Diagnosis not present

## 2018-05-16 DIAGNOSIS — G894 Chronic pain syndrome: Secondary | ICD-10-CM | POA: Diagnosis not present

## 2018-05-16 MED ORDER — HYDROCODONE-ACETAMINOPHEN 5-325 MG PO TABS: 1.0000 | ORAL_TABLET | Freq: Four times a day (QID) | ORAL | 0 refills | Status: DC | PRN

## 2018-05-16 MED ORDER — GABAPENTIN 300 MG PO CAPS: 300.0000 mg | ORAL_CAPSULE | Freq: Three times a day (TID) | ORAL | 2 refills | Status: DC

## 2018-05-16 MED ORDER — CYCLOBENZAPRINE HCL 10 MG PO TABS: 10.0000 mg | ORAL_TABLET | Freq: Every day | ORAL | 2 refills | Status: DC

## 2018-05-16 NOTE — Progress Notes (Signed)
Nursing Pain Medication Assessment:  Safety precautions to be maintained throughout the outpatient stay will include: orient to surroundings, keep bed in low position, maintain call bell within reach at all times, provide assistance with transfer out of bed and ambulation.  Medication Inspection Compliance: Pill count conducted under aseptic conditions, in front of the patient. Neither the pills nor the bottle was removed from the patient's sight at any time. Once count was completed pills were immediately returned to the patient in their original bottle.  Medication: Hydrocodone/APAP Pill/Patch Count: 0 of 120 pills remain Pill/Patch Appearance: Markings consistent with prescribed medication Bottle Appearance: Standard pharmacy container. Clearly labeled. Filled Date: 29 / 13 / 2019 Last Medication intake:  Today

## 2018-05-16 NOTE — Patient Instructions (Signed)
____________________________________________________________________________________________  Medication Rules  Applies to: All patients receiving prescriptions (written or electronic).  Pharmacy of record: Pharmacy where electronic prescriptions will be sent. If written prescriptions are taken to a different pharmacy, please inform the nursing staff. The pharmacy listed in the electronic medical record should be the one where you would like electronic prescriptions to be sent.  Prescription refills: Only during scheduled appointments. Applies to both, written and electronic prescriptions.  NOTE: The following applies primarily to controlled substances (Opioid* Pain Medications).   Patient's responsibilities: 1. Pain Pills: Bring all pain pills to every appointment (except for procedure appointments). 2. Pill Bottles: Bring pills in original pharmacy bottle. Always bring newest bottle. Bring bottle, even if empty. 3. Medication refills: You are responsible for knowing and keeping track of what medications you need refilled. The day before your appointment, write a list of all prescriptions that need to be refilled. Bring that list to your appointment and give it to the admitting nurse. Prescriptions will be written only during appointments. If you forget a medication, it will not be "Called in", "Faxed", or "electronically sent". You will need to get another appointment to get these prescribed. 4. Prescription Accuracy: You are responsible for carefully inspecting your prescriptions before leaving our office. Have the discharge nurse carefully go over each prescription with you, before taking them home. Make sure that your name is accurately spelled, that your address is correct. Check the name and dose of your medication to make sure it is accurate. Check the number of pills, and the written instructions to make sure they are clear and accurate. Make sure that you are given enough medication to last  until your next medication refill appointment. 5. Taking Medication: Take medication as prescribed. Never take more pills than instructed. Never take medication more frequently than prescribed. Taking less pills or less frequently is permitted and encouraged, when it comes to controlled substances (written prescriptions).  6. Inform other Doctors: Always inform, all of your healthcare providers, of all the medications you take. 7. Pain Medication from other Providers: You are not allowed to accept any additional pain medication from any other Doctor or Healthcare provider. There are two exceptions to this rule. (see below) In the event that you require additional pain medication, you are responsible for notifying us, as stated below. 8. Medication Agreement: You are responsible for carefully reading and following our Medication Agreement. This must be signed before receiving any prescriptions from our practice. Safely store a copy of your signed Agreement. Violations to the Agreement will result in no further prescriptions. (Additional copies of our Medication Agreement are available upon request.) 9. Laws, Rules, & Regulations: All patients are expected to follow all Federal and State Laws, Statutes, Rules, & Regulations. Ignorance of the Laws does not constitute a valid excuse. The use of any illegal substances is prohibited. 10. Adopted CDC guidelines & recommendations: Target dosing levels will be at or below 60 MME/day. Use of benzodiazepines** is not recommended.  Exceptions: There are only two exceptions to the rule of not receiving pain medications from other Healthcare Providers. 1. Exception #1 (Emergencies): In the event of an emergency (i.e.: accident requiring emergency care), you are allowed to receive additional pain medication. However, you are responsible for: As soon as you are able, call our office [phone], at any time of the day or night, and leave a message stating your name, the  date and nature of the emergency, and the name and dose of the medication   prescribed. In the event that your call is answered by a member of our staff, make sure to document and save the date, time, and the name of the person that took your information.  2. Exception #2 (Planned Surgery): In the event that you are scheduled by another doctor or dentist to have any type of surgery or procedure, you are allowed (for a period no longer than 30 days), to receive additional pain medication, for the acute post-op pain. However, in this case, you are responsible for picking up a copy of our "Post-op Pain Management for Surgeons" handout, and giving it to your surgeon or dentist. This document is available at our office, and does not require an appointment to obtain it. Simply go to our office during business hours (Monday-Thursday from 8:00 AM to 4:00 PM) (Friday 8:00 AM to 12:00 Noon) or if you have a scheduled appointment with [place], prior to your surgery, and ask for it by name. In addition, you will need to provide [place] with your name, name of your surgeon, type of surgery, and date of procedure or surgery.  *Opioid medications include: morphine, codeine, oxycodone, oxymorphone, hydrocodone, hydromorphone, meperidine, tramadol, tapentadol, buprenorphine, fentanyl, methadone. **Benzodiazepine medications include: diazepam (Valium), alprazolam (Xanax), clonazepam (Klonopine), lorazepam (Ativan), clorazepate (Tranxene), chlordiazepoxide (Librium), estazolam (Prosom), oxazepam (Serax), temazepam (Restoril), triazolam (Halcion) (Last updated: 09/20/2017) ____________________________________________________________________________________________  BMI Assessment: Estimated body mass index is 40.94 kg/m as calculated from the following:   Height as of 02/20/18: 5\' 5"  (1.651 m).   Weight as of 02/20/18: 246 lb (111.6 kg).  BMI interpretation table: BMI level Category Range association with higher incidence of chronic  pain  <18 kg/m2 Underweight   18.5-24.9 kg/m2 Ideal body weight   25-29.9 kg/m2 Overweight Increased incidence by 20%  30-34.9 kg/m2 Obese (Class I) Increased incidence by 68%  35-39.9 kg/m2 Severe obesity (Class II) Increased incidence by 136%  >40 kg/m2 Extreme obesity (Class III) Increased incidence by 254%   Patient's current BMI Ideal Body weight  There is no height or weight on file to calculate BMI. Patient weight not recorded   BMI Readings from Last 4 Encounters:  02/20/18 40.94 kg/m  02/13/18 40.77 kg/m  02/11/18 42.91 kg/m  02/11/18 43.08 kg/m   Wt Readings from Last 4 Encounters:  02/20/18 246 lb (111.6 kg)  02/13/18 245 lb (111.1 kg)  02/11/18 250 lb (113.4 kg)  01/22/18 251 lb (113.9 kg)

## 2018-05-16 NOTE — Progress Notes (Signed)
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 until your next medication refill appointment. 5. Taking Medication: Take medication as prescribed. Never take more pills than instructed. Never take medication more frequently than prescribed. Taking less pills or less frequently is permitted and encouraged, when it comes to controlled substances (written prescriptions).  6. Inform other Doctors: Always inform, all of your healthcare providers, of all the medications you take. 7. Pain Medication from other Providers: You are not allowed to accept any additional pain medication from any other Doctor or Healthcare provider. There are two exceptions to this rule. (see below) In the event that you require additional pain medication, you are responsible for notifying us, as stated below. 8. Medication Agreement: You are  responsible for carefully reading and following our Medication Agreement. This must be signed before receiving any prescriptions from our practice. Safely store a copy of your signed Agreement. Violations to the Agreement will result in no further prescriptions. (Additional copies of our Medication Agreement are available upon request.) 9. Laws, Rules, & Regulations: All patients are expected to follow all Federal and [employer], [employer], Rules, [employer]. Ignorance of the Laws does not constitute a valid excuse. The use of any illegal substances is prohibited. 10. Adopted CDC guidelines & recommendations: Target dosing levels will be at or below 60 MME/day. Use of benzodiazepines** is not recommended.  Exceptions: There are only two exceptions to the rule of not receiving pain medications from other Healthcare Providers. 1. Exception #1 (Emergencies): In the event of an emergency (i.e.: accident requiring emergency care), you are allowed to receive additional pain medication. However, you are responsible for: As soon as you are able, call our office [phone], at any time of the day or night, and leave a message stating your name, the date and nature of the emergency, and the name and dose of the medication prescribed. In the event that your call is answered by a member of our staff, make sure to document and save the date, time, and the name of the person that took your information.  2. Exception #2 (Planned Surgery): In the event that you are scheduled by another doctor or dentist to have any type of surgery or procedure, you are allowed (for a period no longer than 30 days), to receive additional pain medication, for the acute post-op pain. However, in this case, you are responsible for picking up a copy of our "Post-op Pain Management for Surgeons" handout, and giving it to your surgeon or dentist. This document is available at our office, and does not require an appointment to obtain it. Simply  go to our office during business hours (Monday-Thursday from 8:00 AM to 4:00 PM) (Friday 8:00 AM to 12:00 Noon) or if you have a scheduled appointment with [place], prior to your surgery, and ask for it by name. In addition, you will need to provide [place] with your name, name of your surgeon, type of surgery, and date of procedure or surgery.  *  Opioid medications include: morphine, codeine, oxycodone, oxymorphone, hydrocodone, hydromorphone, meperidine, tramadol, tapentadol, buprenorphine, fentanyl, methadone. **Benzodiazepine medications include: diazepam (Valium), alprazolam (Xanax), clonazepam (Klonopine), lorazepam (Ativan), clorazepate (Tranxene), chlordiazepoxide (Librium), estazolam (Prosom), oxazepam (Serax), temazepam (Restoril), triazolam (Halcion) (Last updated: 09/20/2017) ____________________________________________________________________________________________  BMI Assessment: Estimated body mass index is 40.94 kg/m as calculated from the following:   Height as of 02/20/18: _0  (1.651 m).   Weight as of 02/20/18: 246 lb (111.6 kg).  BMI interpretation table: BMI level Category Range association with higher incidence of chronic pain  <18 kg/m2 Underweight   18.5-24.9 kg/m2 Ideal body weight   25-29.9 kg/m2 Overweight Increased incidence by 20%  30-34.9 kg/m2 Obese (Class I) Increased incidence by 68%  35-39.9 kg/m2 Severe obesity (Class II) Increased incidence by 136%  >40 kg/m2 Extreme obesity (Class III) Increased incidence by 254%   Patient's current BMI Ideal Body weight  There is no height or weight on file to calculate BMI. Patient weight not recorded   BMI Readings from Last 4 Encounters:  02/20/18 40.94 kg/m  02/13/18 40.77 kg/m  02/11/18 42.91 kg/m  02/11/18 43.08 kg/m   Wt Readings from Last 4 Encounters:  02/20/18 246 lb (111.6 kg)  02/13/18 245 lb (111.1 kg)  02/11/18 250 lb (113.4 kg)  01/22/18 251 lb (113.9 kg)

## 2018-05-20 LAB — TOXASSURE SELECT 13 (MW), URINE

## 2018-05-30 ENCOUNTER — Other Ambulatory Visit: Payer: Self-pay | Admitting: Internal Medicine

## 2018-06-03 ENCOUNTER — Other Ambulatory Visit: Payer: Self-pay | Admitting: Nurse Practitioner

## 2018-06-25 ENCOUNTER — Other Ambulatory Visit: Payer: Self-pay | Admitting: Internal Medicine

## 2018-06-25 DIAGNOSIS — Z1231 Encounter for screening mammogram for malignant neoplasm of breast: Secondary | ICD-10-CM

## 2018-06-27 ENCOUNTER — Ambulatory Visit: Payer: Self-pay

## 2018-07-22 ENCOUNTER — Other Ambulatory Visit: Payer: Self-pay | Admitting: Pharmacist

## 2018-07-22 NOTE — Patient Outreach (Signed)
[place] [facility]) Care Management  07/22/2018  SHALOM WARE 11-Dec-1945 [id number]  Patient was called regarding medication assistance with Lisinopril. Unfortunately, she did not answer the phone. HIPAA compliant messages were left on both her home and mobile voicemails.   Plan: Document on the [facility] Adherence spreadsheet.   Elayne Guerin, PharmD, [place] Clinical Pharmacist [phone]

## 2018-08-08 ENCOUNTER — Ambulatory Visit
Admission: RE | Admit: 2018-08-08 | Discharge: 2018-08-08 | Disposition: A | Discharge status: Home / Self Care | Payer: Medicare HMO | Source: Ambulatory Visit | Attending: Internal Medicine | Admitting: Internal Medicine

## 2018-08-08 DIAGNOSIS — Z1231 Encounter for screening mammogram for malignant neoplasm of breast: Secondary | ICD-10-CM

## 2018-08-13 ENCOUNTER — Other Ambulatory Visit: Payer: Self-pay

## 2018-08-13 ENCOUNTER — Ambulatory Visit: Payer: Medicare HMO | Attending: Nurse Practitioner | Admitting: Nurse Practitioner

## 2018-08-13 ENCOUNTER — Encounter: Payer: Self-pay | Admitting: Nurse Practitioner

## 2018-08-13 VITALS — BP 133/84 | HR 80 | Temp 98.0°F | Resp 18 | Ht 64.0 in | Wt 250.0 lb

## 2018-08-13 DIAGNOSIS — G894 Chronic pain syndrome: Secondary | ICD-10-CM | POA: Diagnosis not present

## 2018-08-13 DIAGNOSIS — M160 Bilateral primary osteoarthritis of hip: Secondary | ICD-10-CM | POA: Diagnosis not present

## 2018-08-13 DIAGNOSIS — M792 Neuralgia and neuritis, unspecified: Secondary | ICD-10-CM

## 2018-08-13 DIAGNOSIS — M47816 Spondylosis without myelopathy or radiculopathy, lumbar region: Secondary | ICD-10-CM

## 2018-08-13 DIAGNOSIS — G8929 Other chronic pain: Secondary | ICD-10-CM | POA: Diagnosis not present

## 2018-08-13 DIAGNOSIS — M797 Fibromyalgia: Secondary | ICD-10-CM

## 2018-08-13 DIAGNOSIS — M25562 Pain in left knee: Secondary | ICD-10-CM | POA: Diagnosis not present

## 2018-08-13 DIAGNOSIS — M25561 Pain in right knee: Secondary | ICD-10-CM | POA: Diagnosis not present

## 2018-08-13 MED ORDER — HYDROCODONE-ACETAMINOPHEN 5-325 MG PO TABS: 1.0000 | ORAL_TABLET | Freq: Four times a day (QID) | ORAL | 0 refills | Status: DC | PRN

## 2018-08-13 MED ORDER — GABAPENTIN 300 MG PO CAPS: 300.0000 mg | ORAL_CAPSULE | Freq: Three times a day (TID) | ORAL | 2 refills | Status: DC

## 2018-08-13 NOTE — Progress Notes (Addendum)
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 for: As soon as you are able, call our office [phone], at any time of the day or night, and leave a message stating your name, the date and nature of the emergency, and the name and dose of the medication prescribed. In the event that your call is answered by a member of our staff, make sure to document and save the date, time, and the name of the person that took your information.  2. Exception #2 (Planned Surgery): In the event that you are scheduled by another doctor or dentist to have any type of surgery or procedure, you are allowed (for a period no longer than 30 days), to receive additional pain medication, for the acute post-op pain. However, in this case, you are responsible for picking up a copy of our "Post-op Pain  Management for Surgeons" handout, and giving it to your surgeon or dentist. This document is available at our office, and does not require an appointment to obtain it. Simply go to our office during business hours (Monday-Thursday from 8:00 AM to 4:00 PM) (Friday 8:00 AM to 12:00 Noon) or if you have a scheduled appointment with [place], prior to your surgery, and ask for it by name. In addition, you will need to provide [place] with your name, name of your surgeon, type of surgery, and date of procedure or surgery.  *Opioid medications include: morphine, codeine, oxycodone, oxymorphone, hydrocodone, hydromorphone, meperidine, tramadol, tapentadol, buprenorphine, fentanyl, methadone. **Benzodiazepine medications include: diazepam (Valium), alprazolam (Xanax), clonazepam (Klonopine), lorazepam (Ativan), clorazepate (Tranxene), chlordiazepoxide (Librium), estazolam (Prosom), oxazepam (Serax), temazepam (Restoril), triazolam (Halcion) (Last updated: 09/20/2017) ____________________________________________________________________________________________   BMI Assessment: Estimated body mass index is 42.91 kg/m as calculated from the following:   Height as of this encounter: '5\' 4"'$  (1.626 m).   Weight as of this encounter: 250 lb (113.4 kg).  BMI interpretation table: BMI level Category Range association with higher incidence of chronic pain  <18 kg/m2 Underweight   18.5-24.9 kg/m2 Ideal body weight   25-29.9 kg/m2 Overweight Increased incidence by 20%  30-34.9 kg/m2 Obese (Class I) Increased incidence by 68%  35-39.9 kg/m2 Severe obesity (Class II) Increased incidence by 136%  >40 kg/m2 Extreme obesity (Class III) Increased incidence by 254%   Patient's current BMI Ideal Body weight  Body mass index is 42.91 kg/m. Ideal body weight: 54.7 kg (120 lb 9.5 oz) Adjusted ideal body weight: 78.2 kg (172 lb 5.7 oz)   BMI Readings from Last 4 Encounters:  08/13/18 42.91 kg/m  05/16/18 44.29 kg/m  02/20/18  40.94 kg/m  02/13/18 40.77 kg/m   Wt Readings from Last 4 Encounters:  08/13/18 250 lb (113.4 kg)  05/16/18 250 lb (113.4 kg)  02/20/18 246 lb (111.6 kg)  02/13/18 245 lb (111.1 kg)

## 2018-08-13 NOTE — Progress Notes (Signed)
   Nursing Pain Medication Assessment:  Safety precautions to be maintained throughout the outpatient stay will include: orient to surroundings, keep bed in low position, maintain call bell within reach at all times, provide assistance with transfer out of bed and ambulation.  Medication Inspection Compliance: Wanda [name] did not comply with our request to bring her pills to be counted. She was reminded that bringing the medication bottles, even when empty, is a requirement.  Medication: None brought in. Pill/Patch Count: None available to be counted. Bottle Appearance: No container available. Did not bring bottle(s) to appointment. Filled Date: N/A Last Medication intake:  Today

## 2018-08-13 NOTE — Patient Instructions (Addendum)
____________________________________________________________________________________________  Medication Rules  Purpose: To inform patients, and their family members, of our rules and regulations.  Applies to: All patients receiving prescriptions (written or electronic).  Pharmacy of record: Pharmacy where electronic prescriptions will be sent. If written prescriptions are taken to a different pharmacy, please inform the nursing staff. The pharmacy listed in the electronic medical record should be the one where you would like electronic prescriptions to be sent.  Electronic prescriptions: In compliance with the [place] Strengthen Opioid Misuse Prevention (STOP) Act of 2017 (Session Law 2017-74/H243), effective July 24, 2018, all controlled substances must be electronically prescribed. Calling prescriptions to the pharmacy will cease to exist.  Prescription refills: Only during scheduled appointments. Applies to all prescriptions.  NOTE: The following applies primarily to controlled substances (Opioid* Pain Medications).   Patient's responsibilities: 1. Pain Pills: Bring all pain pills to every appointment (except for procedure appointments). 2. Pill Bottles: Bring pills in original pharmacy bottle. Always bring the newest bottle. Bring bottle, even if empty. 3. Medication refills: You are responsible for knowing and keeping track of what medications you take and those you need refilled. The day before your appointment: write a list of all prescriptions that need to be refilled. The day of the appointment: give the list to the admitting nurse. Prescriptions will be written only during appointments. If you forget a medication: it will not be "Called in", "Faxed", or "electronically sent". You will need to get another appointment to get these prescribed. No early refills. Do not call asking to have your prescription filled early. 4. Prescription Accuracy: You are responsible for  carefully inspecting your prescriptions before leaving our office. Have the discharge nurse carefully go over each prescription with you, before taking them home. Make sure that your name is accurately spelled, that your address is correct. Check the name and dose of your medication to make sure it is accurate. Check the number of pills, and the written instructions to make sure they are clear and accurate. Make sure that you are given enough medication to last until your next medication refill appointment. 5. Taking Medication: Take medication as prescribed. When it comes to controlled substances, taking less pills or less frequently than prescribed is permitted and encouraged. Never take more pills than instructed. Never take medication more frequently than prescribed.  6. Inform other Doctors: Always inform, all of your healthcare providers, of all the medications you take. 7. Pain Medication from other Providers: You are not allowed to accept any additional pain medication from any other Doctor or Healthcare provider. There are two exceptions to this rule. (see below) In the event that you require additional pain medication, you are responsible for notifying us, as stated below. 8. Medication Agreement: You are responsible for carefully reading and following our Medication Agreement. This must be signed before receiving any prescriptions from our practice. Safely store a copy of your signed Agreement. Violations to the Agreement will result in no further prescriptions. (Additional copies of our Medication Agreement are available upon request.) 9. Laws, Rules, & Regulations: All patients are expected to follow all Federal and State Laws, Statutes, Rules, & Regulations. Ignorance of the Laws does not constitute a valid excuse. The use of any illegal substances is prohibited. 10. Adopted CDC guidelines & recommendations: Target dosing levels will be at or below 60 MME/day. Use of benzodiazepines** is not  recommended.  Exceptions: There are only two exceptions to the rule of not receiving pain medications from other Healthcare Providers. 1.   Exception #1 (Emergencies): In the event of an emergency (i.e.: accident requiring emergency care), you are allowed to receive additional pain medication. However, you are responsible for: As soon as you are able, call our office [phone], at any time of the day or night, and leave a message stating your name, the date and nature of the emergency, and the name and dose of the medication prescribed. In the event that your call is answered by a member of our staff, make sure to document and save the date, time, and the name of the person that took your information.  2. Exception #2 (Planned Surgery): In the event that you are scheduled by another doctor or dentist to have any type of surgery or procedure, you are allowed (for a period no longer than 30 days), to receive additional pain medication, for the acute post-op pain. However, in this case, you are responsible for picking up a copy of our "Post-op Pain Management for Surgeons" handout, and giving it to your surgeon or dentist. This document is available at our office, and does not require an appointment to obtain it. Simply go to our office during business hours (Monday-Thursday from 8:00 AM to 4:00 PM) (Friday 8:00 AM to 12:00 Noon) or if you have a scheduled appointment with [place], prior to your surgery, and ask for it by name. In addition, you will need to provide [place] with your name, name of your surgeon, type of surgery, and date of procedure or surgery.  *Opioid medications include: morphine, codeine, oxycodone, oxymorphone, hydrocodone, hydromorphone, meperidine, tramadol, tapentadol, buprenorphine, fentanyl, methadone. **Benzodiazepine medications include: diazepam (Valium), alprazolam (Xanax), clonazepam (Klonopine), lorazepam (Ativan), clorazepate (Tranxene), chlordiazepoxide (Librium), estazolam (Prosom),  oxazepam (Serax), temazepam (Restoril), triazolam (Halcion) (Last updated: 09/20/2017) ____________________________________________________________________________________________   BMI Assessment: Estimated body mass index is 42.91 kg/m as calculated from the following:   Height as of this encounter: 5\' 4"  (1.626 m).   Weight as of this encounter: 250 lb (113.4 kg).  BMI interpretation table: BMI level Category Range association with higher incidence of chronic pain  <18 kg/m2 Underweight   18.5-24.9 kg/m2 Ideal body weight   25-29.9 kg/m2 Overweight Increased incidence by 20%  30-34.9 kg/m2 Obese (Class I) Increased incidence by 68%  35-39.9 kg/m2 Severe obesity (Class II) Increased incidence by 136%  >40 kg/m2 Extreme obesity (Class III) Increased incidence by 254%   Patient's current BMI Ideal Body weight  Body mass index is 42.91 kg/m. Ideal body weight: 54.7 kg (120 lb 9.5 oz) Adjusted ideal body weight: 78.2 kg (172 lb 5.7 oz)   BMI Readings from Last 4 Encounters:  08/13/18 42.91 kg/m  05/16/18 44.29 kg/m  02/20/18 40.94 kg/m  02/13/18 40.77 kg/m   Wt Readings from Last 4 Encounters:  08/13/18 250 lb (113.4 kg)  05/16/18 250 lb (113.4 kg)  02/20/18 246 lb (111.6 kg)  02/13/18 245 lb (111.1 kg)

## 2018-08-15 ENCOUNTER — Ambulatory Visit: Payer: Medicare HMO | Attending: Pain Medicine | Admitting: Pain Medicine

## 2018-08-15 ENCOUNTER — Other Ambulatory Visit: Payer: Self-pay

## 2018-08-15 ENCOUNTER — Encounter: Payer: Self-pay | Admitting: Pain Medicine

## 2018-08-15 ENCOUNTER — Telehealth: Payer: Self-pay | Admitting: Nurse Practitioner

## 2018-08-15 VITALS — BP 143/89 | HR 76 | Temp 98.6°F | Resp 16 | Ht 64.0 in | Wt 250.0 lb

## 2018-08-15 DIAGNOSIS — G8929 Other chronic pain: Secondary | ICD-10-CM

## 2018-08-15 DIAGNOSIS — M25562 Pain in left knee: Secondary | ICD-10-CM | POA: Insufficient documentation

## 2018-08-15 DIAGNOSIS — M25561 Pain in right knee: Secondary | ICD-10-CM | POA: Diagnosis not present

## 2018-08-15 DIAGNOSIS — M170 Bilateral primary osteoarthritis of knee: Secondary | ICD-10-CM | POA: Insufficient documentation

## 2018-08-15 MED ORDER — LIDOCAINE HCL (PF) 1 % IJ SOLN
INTRAMUSCULAR | Status: AC
  Filled 2018-08-15: qty 5

## 2018-08-15 MED ORDER — METHYLPREDNISOLONE ACETATE 80 MG/ML IJ SUSP
INTRAMUSCULAR | Status: AC
  Filled 2018-08-15: qty 1

## 2018-08-15 MED ORDER — LIDOCAINE HCL (PF) 1 % IJ SOLN
5.0000 mL | Freq: Once | INTRAMUSCULAR | Status: AC
  Administered 2018-08-15: 5 mL

## 2018-08-15 MED ORDER — ROPIVACAINE HCL 2 MG/ML IJ SOLN
2.0000 mL | Freq: Once | INTRAMUSCULAR | Status: AC
  Administered 2018-08-15: 10 mL via INTRA_ARTICULAR

## 2018-08-15 MED ORDER — ROPIVACAINE HCL 2 MG/ML IJ SOLN
INTRAMUSCULAR | Status: AC
  Filled 2018-08-15: qty 10

## 2018-08-15 MED ORDER — METHYLPREDNISOLONE ACETATE 80 MG/ML IJ SUSP
80.0000 mg | Freq: Once | INTRAMUSCULAR | Status: AC
  Administered 2018-08-15: 80 mg via INTRA_ARTICULAR

## 2018-08-15 NOTE — Telephone Encounter (Signed)
Pt called and stated that the pharmacy still hasn't received her prescriptions that Crystal was supposed to send Tuesday after her appt.

## 2018-08-15 NOTE — Telephone Encounter (Signed)
Patient informed that 3 scripts for Hydrocodone and one for Gabapentin were sent to [facility].

## 2018-08-15 NOTE — Patient Instructions (Signed)

## 2018-08-15 NOTE — Progress Notes (Signed)
Patient's Name: Wanda [name]  MRN: [id number]  Referring Provider: Colon Branch, MD  DOB: [birth year]  PCP: Colon Branch, MD  DOS: 08/15/2018  Note by: Gaspar Cola, MD  Service setting: Ambulatory outpatient  Specialty: Interventional Pain Management  Patient type: Established  Location: [facility] (AMB) Pain Management Facility  Visit type: Interventional Procedure   Primary Reason for Visit: Interventional Pain Management Treatment. CC: Knee Pain (left)  Procedure:          Anesthesia, Analgesia, Anxiolysis:  Type: Diagnostic Intra-Articular Local anesthetic and steroid Knee Injection #1 (for this year) Region: Lateral infrapatellar Knee Region Level: Knee Joint Laterality: Bilateral  Type: Local Anesthesia Indication(s): Analgesia         Local Anesthetic: Lidocaine 1-2% Route: Infiltration ([place]/IM) IV Access: Declined Sedation: Declined   Position: Sitting   Indications: 1. Osteoarthritis of knee (Bilateral)   2. Chronic knee pain (Bilateral) (L>R)    Pain Score: Pre-procedure: 6 /10 Post-procedure: 0-No pain/10  Pre-op Assessment:  Wanda [name] is a 73 y.o. (year old), female patient, seen today for interventional treatment. She  has a past surgical history that includes G3 P3 . Wanda [name] has a current medication list which includes the following prescription(s): aspirin ec, atorvastatin, vitamin d, cyclobenzaprine, gabapentin, hydrochlorothiazide, hydrocodone-acetaminophen, hydrocodone-acetaminophen, hydrocodone-acetaminophen, lisinopril, and sertraline. Her primarily concern today is the Knee Pain (left)  Initial Vital Signs:  Pulse/HCG Rate: 73  Temp: 98.6 F (37 C) Resp: 16 BP: 120/83 SpO2: 100 %  BMI: Estimated body mass index is 42.91 kg/m as calculated from the following:   Height as of this encounter: 5\' 4"  (1.626 m).   Weight as of this encounter: 250 lb (113.4 kg).  Risk Assessment: Allergies: Reviewed. She is allergic to simvastatin.  Allergy  Precautions: None required Coagulopathies: Reviewed. None identified.  Blood-thinner therapy: None at this time Active Infection(s): Reviewed. None identified. Wanda [name] is afebrile  Site Confirmation: Wanda [name] was asked to confirm the procedure and laterality before marking the site Procedure checklist: Completed Consent: Before the procedure and under the influence of no sedative(s), amnesic(s), or anxiolytics, the patient was informed of the treatment options, risks and possible complications. To fulfill our ethical and legal obligations, as recommended by the American Medical Association's Code of Ethics, I have informed the patient of my clinical impression; the nature and purpose of the treatment or procedure; the risks, benefits, and possible complications of the intervention; the alternatives, including doing nothing; the risk(s) and benefit(s) of the alternative treatment(s) or procedure(s); and the risk(s) and benefit(s) of doing nothing. The patient was provided information about the general risks and possible complications associated with the procedure. These may include, but are not limited to: failure to achieve desired goals, infection, bleeding, organ or nerve damage, allergic reactions, paralysis, and death. In addition, the patient was informed of those risks and complications associated to the procedure, such as failure to decrease pain; infection; bleeding; organ or nerve damage with subsequent damage to sensory, motor, and/or autonomic systems, resulting in permanent pain, numbness, and/or weakness of one or several areas of the body; allergic reactions; (i.e.: anaphylactic reaction); and/or death. Furthermore, the patient was informed of those risks and complications associated with the medications. These include, but are not limited to: allergic reactions (i.e.: anaphylactic or anaphylactoid reaction(s)); adrenal axis suppression; blood sugar elevation that in diabetics may  result in ketoacidosis or comma; water retention that in patients with history of congestive heart failure may result in shortness of breath,  pulmonary edema, and decompensation with resultant heart failure; weight gain; swelling or edema; medication-induced neural toxicity; particulate matter embolism and blood vessel occlusion with resultant organ, and/or nervous system infarction; and/or aseptic necrosis of one or more joints. Finally, the patient was informed that Medicine is not an exact science; therefore, there is also the possibility of unforeseen or unpredictable risks and/or possible complications that may result in a catastrophic outcome. The patient indicated having understood very clearly. We have given the patient no guarantees and we have made no promises. Enough time was given to the patient to ask questions, all of which were answered to the patient's satisfaction. Wanda [name] has indicated that she wanted to continue with the procedure. Attestation: I, the ordering provider, attest that I have discussed with the patient the benefits, risks, side-effects, alternatives, likelihood of achieving goals, and potential problems during recovery for the procedure that I have provided informed consent. Date  Time: 08/15/2018  9:35 AM  Pre-Procedure Preparation:  Monitoring: As per clinic protocol. Respiration, ETCO2, SpO2, BP, heart rate and rhythm monitor placed and checked for adequate function Safety Precautions: Patient was assessed for positional comfort and pressure points before starting the procedure. Time-out: I initiated and conducted the "Time-out" before starting the procedure, as per protocol. The patient was asked to participate by confirming the accuracy of the "Time Out" information. Verification of the correct person, site, and procedure were performed and confirmed by me, the nursing staff, and the patient. "Time-out" conducted as per Joint Commission's Universal Protocol  (UP.01.01.01). Time: 1008  Description of Procedure:          Target Area: Knee Joint Approach: Just above the Lateral tibial plateau, lateral to the infrapatellar tendon. Area Prepped: Entire knee area, from the mid-thigh to the mid-shin. Prepping solution: ChloraPrep (2% chlorhexidine gluconate and 70% isopropyl alcohol) Safety Precautions: Aspiration looking for blood return was conducted prior to all injections. At no point did we inject any substances, as a needle was being advanced. No attempts were made at seeking any paresthesias. Safe injection practices and needle disposal techniques used. Medications properly checked for expiration dates. SDV (single dose vial) medications used. Description of the Procedure: Protocol guidelines were followed. The patient was placed in position over the fluoroscopy table. The target area was identified and the area prepped in the usual manner. Skin & deeper tissues infiltrated with local anesthetic. Appropriate amount of time allowed to pass for local anesthetics to take effect. The procedure needles were then advanced to the target area. Proper needle placement secured. Negative aspiration confirmed. Solution injected in intermittent fashion, asking for systemic symptoms every 0.5cc of injectate. The needles were then removed and the area cleansed, making sure to leave some of the prepping solution back to take advantage of its long term bactericidal properties. Vitals:   08/15/18 0934 08/15/18 0935 08/15/18 1011  BP:  120/83 (!) 143/89  Pulse: 73  76  Resp: 16  16  Temp: 98.6 F (37 C)    SpO2: 100%  100%  Weight: 250 lb (113.4 kg)    Height: 5\' 4"  (1.626 m)      Start Time: 1008 hrs. End Time: 1010 hrs. Materials:  Needle(s) Type: Regular needle Gauge: 25G Length: 1.5-in Medication(s): Please see orders for medications and dosing details.  Imaging Guidance:          Type of Imaging Technique: None used Indication(s): N/A Exposure Time: No  patient exposure Contrast: None used. Fluoroscopic Guidance: N/A Ultrasound Guidance: N/A Interpretation:  N/A  Antibiotic Prophylaxis:   Anti-infectives (From admission, onward)   None     Indication(s): None identified  Post-operative Assessment:  Post-procedure Vital Signs:  Pulse/HCG Rate: 76  Temp: 98.6 F (37 C) Resp: 16 BP: (!) 143/89 SpO2: 100 %  EBL: None  Complications: No immediate post-treatment complications observed by team, or reported by patient.  Note: The patient tolerated the entire procedure well. A repeat set of vitals were taken after the procedure and the patient was kept under observation following institutional policy, for this type of procedure. Post-procedural neurological assessment was performed, showing return to baseline, prior to discharge. The patient was provided with post-procedure discharge instructions, including a section on how to identify potential problems. Should any problems arise concerning this procedure, the patient was given instructions to immediately contact us, at any time, without hesitation. In any case, we plan to contact the patient by telephone for a follow-up status report regarding this interventional procedure.  Comments:  No additional relevant information.  Plan of Care   Imaging Orders  No imaging studies ordered today    Procedure Orders     KNEE INJECTION  Medications ordered for procedure: Meds ordered this encounter  Medications  . lidocaine (PF) (XYLOCAINE) 1 % injection 5 mL  . ropivacaine (PF) 2 mg/mL (0.2%) (NAROPIN) injection 2 mL  . methylPREDNISolone acetate (DEPO-MEDROL) injection 80 mg   Medications administered: We administered lidocaine (PF), ropivacaine (PF) 2 mg/mL (0.2%), and methylPREDNISolone acetate.  See the medical record for exact dosing, route, and time of administration.  Disposition: Discharge home  Discharge Date & Time: 08/15/2018; 1011 hrs.   Physician-requested Follow-up:  Return for post-procedure eval (2 wks), w/ Dr. Dossie Arbour.  Future Appointments  Date Time Provider [place]  08/23/2018  8:40 AM Colon Branch, MD [facility]  09/02/2018  1:15 PM Milinda Pointer, MD [facility] None  11/11/2018  1:30 PM Vevelyn Francois, NP [facility] None   Primary Care Physician: Colon Branch, MD Location: [facility] Note by: Gaspar Cola, MD Date: 08/15/2018; Time: 11:55 AM  Disclaimer:  Medicine is not an Chief Strategy Officer. The only guarantee in medicine is that nothing is guaranteed. It is important to note that the decision to proceed with this intervention was based on the information collected from the patient. The Data and conclusions were drawn from the patient's questionnaire, the interview, and the physical examination. Because the information was provided in large part by the patient, it cannot be guaranteed that it has not been purposely or unconsciously manipulated. Every effort has been made to obtain as much relevant data as possible for this evaluation. It is important to note that the conclusions that lead to this procedure are derived in large part from the available data. Always take into account that the treatment will also be dependent on availability of resources and existing treatment guidelines, considered by other Pain Management Practitioners as being common knowledge and practice, at the time of the intervention. For Medico-Legal purposes, it is also important to point out that variation in procedural techniques and pharmacological choices are the acceptable norm. The indications, contraindications, technique, and results of the above procedure should only be interpreted and judged by a Board-Certified Interventional Pain Specialist with extensive familiarity and expertise in the same exact procedure and technique.

## 2018-08-15 NOTE — Progress Notes (Signed)
Safety precautions to be maintained throughout the outpatient stay will include: orient to surroundings, keep bed in low position, maintain call bell within reach at all times, provide assistance with transfer out of bed and ambulation.  

## 2018-08-16 ENCOUNTER — Telehealth: Payer: Self-pay

## 2018-08-16 NOTE — Telephone Encounter (Signed)
Call was placed to pt, no answer and answering service was full.

## 2018-08-23 ENCOUNTER — Ambulatory Visit: Payer: Medicare HMO | Admitting: Internal Medicine

## 2018-08-23 DIAGNOSIS — Z0289 Encounter for other administrative examinations: Secondary | ICD-10-CM

## 2018-09-02 ENCOUNTER — Ambulatory Visit: Payer: Medicare HMO | Admitting: Pain Medicine

## 2018-09-02 DIAGNOSIS — G8929 Other chronic pain: Secondary | ICD-10-CM | POA: Insufficient documentation

## 2018-09-02 DIAGNOSIS — M25551 Pain in right hip: Secondary | ICD-10-CM

## 2018-09-02 DIAGNOSIS — R937 Abnormal findings on diagnostic imaging of other parts of musculoskeletal system: Secondary | ICD-10-CM | POA: Insufficient documentation

## 2018-09-02 DIAGNOSIS — M25552 Pain in left hip: Secondary | ICD-10-CM

## 2018-09-02 DIAGNOSIS — M5442 Lumbago with sciatica, left side: Secondary | ICD-10-CM

## 2018-09-02 DIAGNOSIS — M7918 Myalgia, other site: Secondary | ICD-10-CM | POA: Insufficient documentation

## 2018-09-02 NOTE — Progress Notes (Deleted)
Patient's Name: Wanda [name]  MRN: [id number]  Referring Provider: Colon Branch, MD  DOB: 01/16/1946  PCP: Colon Branch, MD  DOS: 09/02/2018  Note by: Gaspar Cola, MD  Service setting: Ambulatory outpatient  Specialty: Interventional Pain Management  Location: [facility] (AMB) Pain Management Facility    Patient type: Established   Primary Reason(s) for Visit: Encounter for post-procedure evaluation of chronic illness with mild to moderate exacerbation CC: No chief complaint on file.  HPI  Ms. Zelaya is a 73 y.o. year old, female patient, who comes today for a post-procedure evaluation. 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 with possible L3 nerve root compression); Encounter for therapeutic drug level monitoring; Annual physical exam; Chronic sacroiliac joint pain (Right); Chronic radicular pain of lower extremity (Left) (S1 dermatome); Herpes zoster; Osteoarthritis of hips (Bilateral); Morbid obesity with body mass index (BMI) of 40.0 to 44.9 in adult ([facility]; PCP NOTES >>>>>>; Chronic knee pain (Bilateral) (L>R); Osteoarthritis of knee (Bilateral); Long term current use of opiate analgesic; Chronic hip pain (Bilateral) (L>R); Chronic low back pain (Bilateral) (L>R) w/ sciatica (Left); Chronic musculoskeletal pain; and Abnormal MRI, lumbar spine (03/20/2018) on their problem list. Her primarily concern today is the No chief complaint on file.  Pain Assessment: Location:     Radiating:   Onset:   Duration:   Quality:   Severity:  /10 (subjective,  self-reported pain score)  Note: Reported level is compatible with observation.                         When using our objective Pain Scale, levels between 6 and 10/10 are said to belong in an emergency room, as it progressively worsens from a 6/10, described as severely limiting, requiring emergency care not usually available at an outpatient pain management facility. At a 6/10 level, communication becomes difficult and requires great effort. Assistance to reach the emergency department may be required. Facial flushing and profuse sweating along with potentially dangerous increases in heart rate and blood pressure will be evident. Effect on ADL:   Timing:   Modifying factors:   BP:    HR:    Ms. TShuffieldcomes in today for post-procedure evaluation.  Further details on both, my assessment(s), as well as the proposed treatment plan, please see below.  Post-Procedure Assessment  08/15/2018 Procedure: Diagnostic bilateral intra-articular knee joint injection #1 with local anesthetic and steroid, no fluoroscopy or IV sedation Pre-procedure pain score:  6/10 Post-procedure pain score: 0/10 (100% relief) Influential Factors: BMI:   Intra-procedural challenges: None observed.         Assessment challenges: None detected.              Reported side-effects: None.        Post-procedural adverse reactions or complications: None reported         Sedation: No sedation used. When no sedatives are used, the analgesic levels obtained are directly associated to the effectiveness of the local anesthetics. However, when sedation is provided, the level of analgesia obtained during the initial 1 hour following the intervention, is believed to be the result of a combination of factors. These  factors may include, but are not limited to: 1. The effectiveness of the local anesthetics used. 2. The effects of the analgesic(s) and/or anxiolytic(s) used. 3. The degree of discomfort experienced by the patient at the  time of the procedure. 4. The patients ability and reliability in recalling and recording the events. 5. The presence and influence of possible secondary gains and/or psychosocial factors. Reported result: Relief experienced during the 1st hour after the procedure:   (Ultra-Short Term Relief)            Interpretative annotation: Clinically appropriate result. No IV Analgesic or Anxiolytic given, therefore benefits are completely due to Local Anesthetic effects.          Effects of local anesthetic: The analgesic effects attained during this period are directly associated to the localized infiltration of local anesthetics and therefore cary significant diagnostic value as to the etiological location, or anatomical origin, of the pain. Expected duration of relief is directly dependent on the pharmacodynamics of the local anesthetic used. Long-acting (4-6 hours) anesthetics used.  Reported result: Relief during the next 4 to 6 hour after the procedure:   (Short-Term Relief)            Interpretative annotation: Clinically appropriate result. Analgesia during this period is likely to be Local Anesthetic-related.          Long-term benefit: Defined as the period of time past the expected duration of local anesthetics (1 hour for short-acting and 4-6 hours for long-acting). With the possible exception of prolonged sympathetic blockade from the local anesthetics, benefits during this period are typically attributed to, or associated with, other factors such as analgesic sensory neuropraxia, antiinflammatory effects, or beneficial biochemical changes provided by agents other than the local anesthetics.  Reported result: Extended relief following procedure:   (Long-Term Relief)            Interpretative annotation: Clinically possible results. Good relief. No permanent benefit expected. Inflammation plays a part in the etiology to the pain.          Current benefits: Defined as reported results that persistent  at this point in time.   Analgesia: *** %            Function: Somewhat improved ROM: Somewhat improved Interpretative annotation: Recurrence of symptoms. No permanent benefit expected. Effective diagnostic intervention.          Interpretation: Results would suggest a successful diagnostic intervention.                  Plan:  Please see "Plan of Care" for details.                Laboratory Chemistry  Inflammation Markers (CRP: Acute Phase) (ESR: Chronic Phase) Lab Results  Component Value Date   CRP <0.5 08/18/2015   ESRSEDRATE 28 08/18/2015                         Rheumatology Markers No results found for: RF, ANA, LABURIC, URICUR, LYMEIGGIGMAB, LYMEABIGMQN, HLAB27                      Renal Markers Lab Results  Component Value Date   BUN 12 12/24/2017   CREATININE 0.61 12/24/2017   GFRAA >60 08/18/2015   GFRNONAA >60 08/18/2015                             Hepatic  Markers Lab Results  Component Value Date   AST 19 12/24/2017   ALT 18 12/24/2017   ALBUMIN 4.0 12/24/2017   HCVAB NEGATIVE 08/30/2016                        Neuropathy Markers No results found for: VITAMINB12, HGBA1C, HIV                      Hematology Parameters Lab Results  Component Value Date   PLT 174.0 12/24/2017   HGB 13.0 12/24/2017   HCT 38.9 12/24/2017                        CV Markers No results found for: BNP, CKTOTAL, CKMB, TROPONINI                       Note: Lab results reviewed.  Recent Imaging Results   Results for orders placed in visit on 01/08/17  DG C-Arm 1-60 Min-No Report   Narrative Fluoroscopy was utilized by the requesting physician.  No radiographic  interpretation.    Interpretation Report: Fluoroscopy was used during the procedure to assist with needle guidance. The images were interpreted intraoperatively by the requesting physician.  Meds   Current Outpatient Medications:  .  aspirin EC 81 MG tablet, Take 81 mg by mouth daily., Disp: , Rfl:  .   atorvastatin (LIPITOR) 10 MG tablet, Take 1 tablet (10 mg total) by mouth at bedtime., Disp: 90 tablet, Rfl: 1 .  Cholecalciferol (VITAMIN D) 2000 UNITS tablet, Take 2,000 Units by mouth daily., Disp: , Rfl:  .  cyclobenzaprine (FLEXERIL) 10 MG tablet, Take 1 tablet (10 mg total) by mouth at bedtime., Disp: 30 tablet, Rfl: 2 .  gabapentin (NEURONTIN) 300 MG capsule, Take 1 capsule (300 mg total) by mouth 3 (three) times daily., Disp: 90 capsule, Rfl: 2 .  hydrochlorothiazide (HYDRODIURIL) 25 MG tablet, Take 1 tablet (25 mg total) by mouth daily., Disp: 90 tablet, Rfl: 1 .  [START ON 10/13/2018] HYDROcodone-acetaminophen (NORCO/VICODIN) 5-325 MG tablet, Take 1 tablet by mouth every 6 (six) hours as needed for up to 30 days for severe pain., Disp: 120 tablet, Rfl: 0 .  [START ON 09/13/2018] HYDROcodone-acetaminophen (NORCO/VICODIN) 5-325 MG tablet, Take 1 tablet by mouth every 6 (six) hours as needed for up to 30 days for severe pain., Disp: 120 tablet, Rfl: 0 .  HYDROcodone-acetaminophen (NORCO/VICODIN) 5-325 MG tablet, Take 1 tablet by mouth every 6 (six) hours as needed for up to 30 days for moderate pain., Disp: 120 tablet, Rfl: 0 .  lisinopril (PRINIVIL,ZESTRIL) 10 MG tablet, Take 1 tablet (10 mg total) by mouth daily., Disp: 90 tablet, Rfl: 1 .  sertraline (ZOLOFT) 50 MG tablet, Take 3 tablets (150 mg total) by mouth daily., Disp: 90 tablet, Rfl: 6  ROS  Constitutional: Denies any fever or chills Gastrointestinal: No reported hemesis, hematochezia, vomiting, or acute GI distress Musculoskeletal: Denies any acute onset joint swelling, redness, loss of ROM, or weakness Neurological: No reported episodes of acute onset apraxia, aphasia, dysarthria, agnosia, amnesia, paralysis, loss of coordination, or loss of consciousness  Allergies  Ms. Bake is allergic to simvastatin.  [place]  Drug: Ms. Hohman  reports no history of drug use. Alcohol:  reports no history of alcohol use. Tobacco:  reports  that she has never smoked. She has never used smokeless tobacco. Medical:  has a past medical history  of Allergy, Chronic back pain, Depression, DJD (degenerative joint disease), Hyperlipidemia, Hypertension, Menopause, Musculoskeletal pain (12/07/2015), and TMJ PAIN (05/17/2010). Surgical: Ms. Riveron  has a past surgical history that includes G3 P3 . Family: family history includes Breast cancer in her sister; Cancer in her brother and sister; Colon cancer in her brother; Diabetes in an other family member.  Constitutional Exam  General appearance: Well nourished, well developed, and well hydrated. In no apparent acute distress There were no vitals filed for this visit. BMI Assessment: Estimated body mass index is 42.91 kg/m as calculated from the following:   Height as of 08/15/18: '5\' 4"'$  (1.626 m).   Weight as of 08/15/18: 250 lb (113.4 kg).  BMI interpretation table: BMI level Category Range association with higher incidence of chronic pain  <18 kg/m2 Underweight   18.5-24.9 kg/m2 Ideal body weight   25-29.9 kg/m2 Overweight Increased incidence by 20%  30-34.9 kg/m2 Obese (Class I) Increased incidence by 68%  35-39.9 kg/m2 Severe obesity (Class II) Increased incidence by 136%  >40 kg/m2 Extreme obesity (Class III) Increased incidence by 254%   Patient's current BMI Ideal Body weight  There is no height or weight on file to calculate BMI. Patient weight not recorded   BMI Readings from Last 4 Encounters:  08/15/18 42.91 kg/m  08/13/18 42.91 kg/m  05/16/18 44.29 kg/m  02/20/18 40.94 kg/m   Wt Readings from Last 4 Encounters:  08/15/18 250 lb (113.4 kg)  08/13/18 250 lb (113.4 kg)  05/16/18 250 lb (113.4 kg)  02/20/18 246 lb (111.6 kg)  Psych/Mental status: Alert, oriented x 3 (person, place, & time)       Eyes: PERLA Respiratory: No evidence of acute respiratory distress  Cervical Spine Area Exam  Skin & Axial Inspection: No masses, redness, edema, swelling, or associated  skin lesions Alignment: Symmetrical Functional ROM: Unrestricted ROM      Stability: No instability detected Muscle Tone/Strength: Functionally intact. No obvious neuro-muscular anomalies detected. Sensory (Neurological): Unimpaired Palpation: No palpable anomalies              Upper Extremity (UE) Exam    Side: Right upper extremity  Side: Left upper extremity  Skin & Extremity Inspection: Skin color, temperature, and hair growth are WNL. No peripheral edema or cyanosis. No masses, redness, swelling, asymmetry, or associated skin lesions. No contractures.  Skin & Extremity Inspection: Skin color, temperature, and hair growth are WNL. No peripheral edema or cyanosis. No masses, redness, swelling, asymmetry, or associated skin lesions. No contractures.  Functional ROM: Unrestricted ROM          Functional ROM: Unrestricted ROM          Muscle Tone/Strength: Functionally intact. No obvious neuro-muscular anomalies detected.  Muscle Tone/Strength: Functionally intact. No obvious neuro-muscular anomalies detected.  Sensory (Neurological): Unimpaired          Sensory (Neurological): Unimpaired          Palpation: No palpable anomalies              Palpation: No palpable anomalies              Provocative Test(s):  Phalen's test: deferred Tinel's test: deferred Apley's scratch test (touch opposite shoulder):  Action 1 (Across chest): deferred Action 2 (Overhead): deferred Action 3 (LB reach): deferred   Provocative Test(s):  Phalen's test: deferred Tinel's test: deferred Apley's scratch test (touch opposite shoulder):  Action 1 (Across chest): deferred Action 2 (Overhead): deferred Action 3 (LB reach): deferred  Thoracic Spine Area Exam  Skin & Axial Inspection: No masses, redness, or swelling Alignment: Symmetrical Functional ROM: Unrestricted ROM Stability: No instability detected Muscle Tone/Strength: Functionally intact. No obvious neuro-muscular anomalies detected. Sensory  (Neurological): Unimpaired Muscle strength & Tone: No palpable anomalies  Lumbar Spine Area Exam  Skin & Axial Inspection: No masses, redness, or swelling Alignment: Symmetrical Functional ROM: Unrestricted ROM       Stability: No instability detected Muscle Tone/Strength: Functionally intact. No obvious neuro-muscular anomalies detected. Sensory (Neurological): Unimpaired Palpation: No palpable anomalies       Provocative Tests: Hyperextension/rotation test: deferred today       Lumbar quadrant test (Kemp's test): deferred today       Lateral bending test: deferred today       Patrick's Maneuver: deferred today                   FABER* test: deferred today                   S-I anterior distraction/compression test: deferred today         S-I lateral compression test: deferred today         S-I Thigh-thrust test: deferred today         S-I Gaenslen's test: deferred today         *(Flexion, ABduction and External Rotation)  Gait & Posture Assessment  Ambulation: Unassisted Gait: Relatively normal for age and body habitus Posture: WNL   Lower Extremity Exam    Side: Right lower extremity  Side: Left lower extremity  Stability: No instability observed          Stability: No instability observed          Skin & Extremity Inspection: Skin color, temperature, and hair growth are WNL. No peripheral edema or cyanosis. No masses, redness, swelling, asymmetry, or associated skin lesions. No contractures.  Skin & Extremity Inspection: Skin color, temperature, and hair growth are WNL. No peripheral edema or cyanosis. No masses, redness, swelling, asymmetry, or associated skin lesions. No contractures.  Functional ROM: Unrestricted ROM                  Functional ROM: Unrestricted ROM                  Muscle Tone/Strength: Functionally intact. No obvious neuro-muscular anomalies detected.  Muscle Tone/Strength: Functionally intact. No obvious neuro-muscular anomalies detected.  Sensory  (Neurological): Unimpaired        Sensory (Neurological): Unimpaired        DTR: Patellar: deferred today Achilles: deferred today Plantar: deferred today  DTR: Patellar: deferred today Achilles: deferred today Plantar: deferred today  Palpation: No palpable anomalies  Palpation: No palpable anomalies   Assessment   Status Diagnosis  Controlled Controlled Controlled 1. Chronic knee pain (Bilateral) (L>R)   2. Chronic hip pain (Bilateral) (L>R)   3. Chronic low back pain (Bilateral) (L>R) w/ sciatica (Left)   4. Chronic musculoskeletal pain   5. Morbid obesity with body mass index (BMI) of 40.0 to 44.9 in adult ([place])   6. Abnormal MRI, lumbar spine (03/20/2018)   7. Grade 1 (33m) Anterolisthesis of L3 over L4.      Updated Problems: Problem  Chronic hip pain (Bilateral) (L>R)  Chronic low back pain (Bilateral) (L>R) w/ sciatica (Left)  Chronic Musculoskeletal Pain  Abnormal MRI, lumbar spine (03/20/2018)   FINDINGS: Segmentation: Nonstandard anatomy. Numbering scheme employed in prior MR is continued today. Lowest open  disc space is S1-S2.  Alignment: 3 mm anterolisthesis L4-5. 5 mm anterolisthesis L5-S1. These are facet related.  Vertebrae: Benign hemangioma L2, without expansion. Modic type 2 endplate changes O9-B3.  Conus medullaris and cauda equina: Conus extends to the L1 level. Conus and cauda equina appear normal.  Paraspinal and other soft tissues: Unremarkable.  Disc levels:  T12-L1: Foraminal protrusion on the RIGHT, seen only on sagittal imaging.  L1-L2: RIGHT foraminal disc protrusion. RIGHT T1 neural impingement is likely.  L2-L3: Preserved disc height. Far-lateral and foraminal protrusion on the LEFT. LEFT L2 neural impingement likely.  L3-L4: Disc space narrowing. Central disc extrusion with posterior element hypertrophy. Superimposed foraminal protrusion on the RIGHT. Mild to moderate stenosis. RIGHT L3 and RIGHT L4 neural  impingement.  L4-L5: 3 mm slip. Uncovering of the disc with annular bulge. Borderline stenosis. Posterior element hypertrophy. No definite impingement.  L5-S1: 5 mm slip. Uncovering of the disc with central and leftward protrusion. Posterior element hypertrophy. Mild stenosis. Possible LEFT S1 neural impingement.  S1-S2: Transitional level.  Normal disc space.  No impingement.  IMPRESSION: Transitional anatomy.  See discussion above.  Progression of degenerative change since prior study in 2017, with the dominant RIGHT-sided abnormality at L3-4. Central disc extrusion with posterior element hypertrophy along with a superimposed foraminal protrusion on the RIGHT; mild to moderate stenosis. Correlate clinically for RIGHT L3 and RIGHT L4 radicular symptoms.  Foraminal protrusion at L2-3 on the LEFT, unchanged from priors.  Foraminal protrusions at T12-L1 and L1-2 both on the RIGHT, are also unchanged from 2017.  Facet mediated slip at L4-5 and L5-S1, both grade 1. Consider standing lumbar flexion extension radiographs to evaluate for dynamic instability.   Electronically Signed   By: Staci Righter M.D.   On: 03/20/2018 15:40   Chronic sacroiliac joint pain (Right)  Chronic radicular pain of lower extremity (Left) (S1 dermatome)  Chronic lumbar radicular pain (Left) (S1 dermatome)  Lumbar facet syndrome (Bilateral) (R>L)  Grade 1 (24m) Anterolisthesis of L3 over L4.  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  Medications administered today: Leelah L. Mankey had no medications administered during this  visit.  Procedure Orders    No procedure(s) ordered today   Lab Orders  No laboratory test(s) ordered today   Imaging Orders  No imaging studies ordered today   Referral Orders  No referral(s) requested today   Interventional management options: Planned, scheduled, and/or pending:   NOTE: NO RFA until BMI <35. Flexion and extension diagnostic x-rays of the lumbar spine to evaluate possible instability of anterolisthesis. ***   Considering:   Diagnostic left vs. bilateral genicular nerve block  Possible left vs. bilateral genicular nerve RFA   Diagnostic right-sided lumbar facet block #2  Diagnostic bilateral lumbar facet block #2  Diagnostic bilateral intra-articular hip joint injection  Possible bilateral femoral nerve + obturator nerve block  Possible bilateral femoral nerve + obturator nerve RFA     Palliative PRN treatment(s):   Palliative left vs. bilateral Hyalgan knee injection series #2 (only 3 injections done) (last one done 01/15/2018) Palliative bilateral lumbar facet blocks  Palliative left-sided L4-5 LESI  Provider-requested follow-up: No follow-ups on file.  Future Appointments  Date Time Provider [place]  09/02/2018  1:15 PM Milinda Pointer, MD [facility] None  11/11/2018  1:30 PM Vevelyn Francois, NP [facility] None   Primary Care Physician: Colon Branch, MD Location: [facility] Outpatient Pain Management Facility Note by: Gaspar Cola, MD Date: 09/02/2018; Time: 6:44 AM

## 2018-09-18 ENCOUNTER — Ambulatory Visit: Payer: Medicare HMO | Admitting: Internal Medicine

## 2018-09-23 ENCOUNTER — Encounter: Payer: Self-pay | Admitting: Internal Medicine

## 2018-09-23 ENCOUNTER — Ambulatory Visit (INDEPENDENT_AMBULATORY_CARE_PROVIDER_SITE_OTHER): Payer: Medicare HMO | Admitting: Internal Medicine

## 2018-09-23 VITALS — BP 128/64 | HR 59 | Temp 97.9°F | Resp 16 | Ht 65.0 in | Wt 242.2 lb

## 2018-09-23 DIAGNOSIS — E559 Vitamin D deficiency, unspecified: Secondary | ICD-10-CM

## 2018-09-23 DIAGNOSIS — E538 Deficiency of other specified B group vitamins: Secondary | ICD-10-CM | POA: Diagnosis not present

## 2018-09-23 DIAGNOSIS — I10 Essential (primary) hypertension: Secondary | ICD-10-CM | POA: Diagnosis not present

## 2018-09-23 DIAGNOSIS — R252 Cramp and spasm: Secondary | ICD-10-CM | POA: Diagnosis not present

## 2018-09-23 DIAGNOSIS — F418 Other specified anxiety disorders: Secondary | ICD-10-CM

## 2018-09-23 LAB — B12 AND FOLATE PANEL
Folate: 7 ng/mL (ref >5.9)
Vitamin B-12: 252 pg/mL (ref 211–911)

## 2018-09-23 LAB — MAGNESIUM: MAGNESIUM: 1.9 mg/dL (ref 1.5–2.5)

## 2018-09-23 MED ORDER — HYDROCHLOROTHIAZIDE 25 MG PO TABS: 25.0000 mg | ORAL_TABLET | Freq: Every day | ORAL | 2 refills | Status: DC

## 2018-09-23 MED ORDER — LISINOPRIL 10 MG PO TABS: 10.0000 mg | ORAL_TABLET | Freq: Every day | ORAL | 2 refills | Status: DC

## 2018-09-23 MED ORDER — ATORVASTATIN CALCIUM 10 MG PO TABS: 10.0000 mg | ORAL_TABLET | Freq: Every day | ORAL | 2 refills | Status: DC

## 2018-09-23 MED ORDER — SERTRALINE HCL 50 MG PO TABS: 150.0000 mg | ORAL_TABLET | Freq: Every day | ORAL | 2 refills | Status: DC

## 2018-09-23 NOTE — Progress Notes (Signed)
Subjective:    Patient ID: Wanda [name], female    DOB: 17-Dec-1945, 73 y.o.   MRN: [id number]  DOS:  09/23/2018 Type of visit - description: Routine visit Depression: Overall doing better, she still under a lot of stress, taking care of her husband. Knee pain: got a local injection, slt better?. Continue with feet > leg cramps, worse at night. Thinks this could be related to atorvastatin.  Hold atorvastatin?[name] Also concerned about vitamin deficiencies.   Review of Systems Denies suicidal or homicidal ideas  Past Medical History:  Diagnosis Date  . Allergy    RHINITIS  . Chronic back pain   . Depression   . DJD (degenerative joint disease)    back pain , on disability  . Hyperlipidemia   . Hypertension   . Menopause    onset age 46   . Musculoskeletal pain 12/07/2015  . TMJ PAIN 05/17/2010   Qualifier: Diagnosis of  By: Larose Kells MD, [place]     Past Surgical History:  Procedure Laterality Date  . G3 P3       Social History   Socioeconomic History  . Marital status: Married    Spouse name: Not on file  . Number of children: 3  . Years of education: Not on file  . Highest education level: Not on file  Occupational History  . Occupation: [employer] DISABILITY  Social Needs  . Financial resource strain: Not on file  . Food insecurity:    Worry: Not on file    Inability: Not on file  . Transportation needs:    Medical: Not on file    Non-medical: Not on file  Tobacco Use  . Smoking status: Never Smoker  . Smokeless tobacco: Never Used  Substance and Sexual Activity  . Alcohol use: No  . Drug use: No  . Sexual activity: Not Currently  Lifestyle  . Physical activity:    Days per week: Not on file    Minutes per session: Not on file  . Stress: Not on file  Relationships  . Social connections:    Talks on phone: Not on file    Gets together: Not on file    Attends religious service: Not on file    Active member of club or organization: Not on file    Attends  meetings of clubs or organizations: Not on file    Relationship status: Not on file  . Intimate partner violence:    Fear of current or ex partner: Not on file    Emotionally abused: Not on file    Physically abused: Not on file    Forced sexual activity: Not on file  Other Topics Concern  . Not on file  Social History Narrative   Married, 3 kids, 6 G-kids    Household-- pt, husband, son      Allergies as of 09/23/2018      Reactions   Simvastatin    REACTION: cramps      Medication List       Accurate as of September 23, 2018 11:10 AM. Always use your most recent med list.        aspirin EC 81 MG tablet Take 81 mg by mouth daily.   atorvastatin 10 MG tablet Commonly known as:  LIPITOR Take 1 tablet (10 mg total) by mouth at bedtime.   cyclobenzaprine 10 MG tablet Commonly known as:  FLEXERIL Take 1 tablet (10 mg total) by mouth at bedtime.   gabapentin 300  MG capsule Commonly known as:  NEURONTIN Take 1 capsule (300 mg total) by mouth 3 (three) times daily.   hydrochlorothiazide 25 MG tablet Commonly known as:  HYDRODIURIL Take 1 tablet (25 mg total) by mouth daily.   HYDROcodone-acetaminophen 5-325 MG tablet Commonly known as:  NORCO/VICODIN Take 1 tablet by mouth every 6 (six) hours as needed for up to 30 days for moderate pain.   HYDROcodone-acetaminophen 5-325 MG tablet Commonly known as:  NORCO/VICODIN Take 1 tablet by mouth every 6 (six) hours as needed for up to 30 days for severe pain.   HYDROcodone-acetaminophen 5-325 MG tablet Commonly known as:  NORCO/VICODIN Take 1 tablet by mouth every 6 (six) hours as needed for up to 30 days for severe pain. Start taking on:  October 13, 2018   lisinopril 10 MG tablet Commonly known as:  PRINIVIL,ZESTRIL Take 1 tablet (10 mg total) by mouth daily.   sertraline 50 MG tablet Commonly known as:  ZOLOFT Take 3 tablets (150 mg total) by mouth daily.   Vitamin D 50 MCG (2000 UT) tablet Take 2,000 Units by mouth  daily.           Objective:   Physical Exam BP 128/64 (BP Location: Left Arm, Patient Position: Sitting, Cuff Size: Normal)   Pulse (!) 59   Temp 97.9 F (36.6 C) (Oral)   Resp 16   Ht 5\' 5"  (1.651 m)   Wt 242 lb 4 oz (109.9 kg)   SpO2 91%   BMI 40.31 kg/m  General:   Well developed, NAD, BMI noted. HEENT:  Normocephalic . Face symmetric, atraumatic Lungs:  CTA B Normal respiratory effort, no intercostal retractions, no accessory muscle use. Heart: RRR,  no murmur.  No pretibial edema bilaterally  Skin: Not pale. Not jaundice Neurologic:  alert & oriented X3.  Speech normal, gait appropriate for age and unassisted Psych--  Cognition and judgment appear intact.  Cooperative with normal attention span and concentration.  Behavior appropriate. No anxious or depressed appearing.      Assessment      Assessment HTN Hyperlipidemia Depression Morbid obesity MSK: DJD, chronic back-knee pain, hydrocodone /gabapentin rx by DR Dossie Arbour  PLAN: HTN: Continue with HCTZ, lisinopril.  Refill medications Depression: Under a lot of stress, caregiver for her husband who has dementia.  Since we increase Zoloft she is overall better although her PHQ 9 test today is 13, moderate depression. Extensive listening therapy provided, declined need to increase the sertraline dose, asked patient to consider see a counselor. MSK, DJD, chronic back pain: Follow-up elsewhere. Feet and leg cramps: Request blood work, will check Z02, folic acid and magnesium level.  Encouraged to take tonic water at night. B12 deficiency?  Checking labs Vitamin D deficiency: Not taking supplements, encouraged to go back to OTC vitamin D Hyperlipidemia: Hold atorvastatin for few weeks to see if that improves the leg cramps. RTC June 2020 CPX

## 2018-09-23 NOTE — Patient Instructions (Addendum)
Please schedule Medicare Wellness with Glenard Haring.   GO TO THE LAB : Get the blood work     GO TO THE FRONT DESK Schedule your next appointment for a physical exam June 2020  Drink diet tonic water at night to help you with leg cramps  Do not take Lipitor (atorvastatin) for the remaining of the month, go back on it on Adrian   The 36-Hour Day a book by Eda Keys and Collier Salina Rabins is a detailed self-help guide for people caring for loved ones with Alzheimer's disease, dementia, and other memory impairments.  Web Resources : [employer]  http://rojas.com/  Local Resource: [facility] [place] [facility]

## 2018-09-23 NOTE — Progress Notes (Signed)
Pre visit review using our clinic review tool, if applicable. No additional management support is needed unless otherwise documented below in the visit note. 

## 2018-09-24 NOTE — Assessment & Plan Note (Signed)
HTN: Continue with HCTZ, lisinopril.  Refill medications Depression: Under a lot of stress, caregiver for her husband who has dementia.  Since we increase Zoloft she is overall better although her PHQ 9 test today is 13, moderate depression. Extensive listening therapy provided, declined need to increase the sertraline dose, asked patient to consider see a counselor. MSK, DJD, chronic back pain: Follow-up elsewhere. Feet and leg cramps: Request blood work, will check N39, folic acid and magnesium level.  Encouraged to take tonic water at night. B12 deficiency?  Checking labs Vitamin D deficiency: Not taking supplements, encouraged to go back to OTC vitamin D Hyperlipidemia: Hold atorvastatin for few weeks to see if that improves the leg cramps. RTC June 2020 CPX

## 2018-11-10 ENCOUNTER — Other Ambulatory Visit: Payer: Self-pay | Admitting: Internal Medicine

## 2018-11-11 ENCOUNTER — Other Ambulatory Visit: Payer: Self-pay

## 2018-11-11 ENCOUNTER — Ambulatory Visit: Payer: Medicare HMO | Attending: Nurse Practitioner | Admitting: Nurse Practitioner

## 2018-11-11 DIAGNOSIS — M159 Polyosteoarthritis, unspecified: Secondary | ICD-10-CM

## 2018-11-11 DIAGNOSIS — M797 Fibromyalgia: Secondary | ICD-10-CM

## 2018-11-11 DIAGNOSIS — G894 Chronic pain syndrome: Secondary | ICD-10-CM

## 2018-11-11 DIAGNOSIS — M150 Primary generalized (osteo)arthritis: Secondary | ICD-10-CM | POA: Diagnosis not present

## 2018-11-11 DIAGNOSIS — M792 Neuralgia and neuritis, unspecified: Secondary | ICD-10-CM | POA: Diagnosis not present

## 2018-11-11 DIAGNOSIS — M47816 Spondylosis without myelopathy or radiculopathy, lumbar region: Secondary | ICD-10-CM | POA: Diagnosis not present

## 2018-11-11 MED ORDER — HYDROCODONE-ACETAMINOPHEN 5-325 MG PO TABS: 1.0000 | ORAL_TABLET | Freq: Four times a day (QID) | ORAL | 0 refills | Status: DC | PRN

## 2018-11-11 MED ORDER — CYCLOBENZAPRINE HCL 10 MG PO TABS: 10.0000 mg | ORAL_TABLET | Freq: Every day | ORAL | 2 refills | Status: DC

## 2018-11-11 MED ORDER — GABAPENTIN 300 MG PO CAPS: 300.0000 mg | ORAL_CAPSULE | Freq: Three times a day (TID) | ORAL | 2 refills | Status: DC

## 2018-11-11 NOTE — Progress Notes (Signed)
Pain Management Encounter Note - Virtual Visit via Telephone Telehealth (real-time audio visits between healthcare provider and patient).  Patient's Phone No. & Preferred Pharmacy:  [phone] (home); [phone] (mobile); (Preferred) [phone]  [facility] [phone] - [address], [address] AT [address] [place] [address] Phone: [phone] Fax: [phone]  [facility] [phone] - [place], [place] - [address] [place] [address] Phone: [phone] Fax: [phone]   Pre-screening note:  Our staff contacted [name] and offered her an "in person", "face-to-face" appointment versus a telephone encounter. She indicated preferring the telephone encounter, at this time.  Reason for Virtual Visit: COVID-19*  Social distancing based on CDC and AMA recommendations.   I contacted REMMY CRASS on 11/11/2018 at 9:30 AM by telephone and clearly identified myself as Dionisio David, NP. I verified that I was speaking with the correct person using two identifiers (Name and date of birth: 1945/12/15).  Advanced Informed Consent I sought verbal advanced consent from [name] for telemedicine interactions and virtual visit. I informed Ms. Suhre of the security and privacy concerns, risks, and limitations associated with performing an evaluation and management service by telephone. I also informed Ms. Everard of the availability of "in person" appointments and I informed her of the possibility of a patient responsible charge related to this service. Ms. Anes expressed understanding and agreed to proceed.   Historic Elements   [name] is a 73 y.o. year old, female patient evaluated today after her last encounter by our practice on 08/16/2018. [name]  has a past medical history of Allergy, Chronic back pain, Depression,  DJD (degenerative joint disease), Hyperlipidemia, Hypertension, Menopause, Musculoskeletal pain (12/07/2015), and TMJ PAIN (05/17/2010). She also  has a past surgical history that includes G3 P3 . [name] has a current medication list which includes the following prescription(s): aspirin ec, atorvastatin, vitamin d, cyclobenzaprine, gabapentin, hydrochlorothiazide, hydrocodone-acetaminophen, hydrocodone-acetaminophen, hydrocodone-acetaminophen, lisinopril, and sertraline. She  reports that she has never smoked. She has never used smokeless tobacco. She reports that she does not drink alcohol or use drugs. Ms. Keadle is allergic to simvastatin.   HPI  I last saw her on 08/15/2018. She is being evaluated for both, medication management and a post-procedure assessment. She is having 4-5/10. She admits that she has not taken any medication yet. She is having middle back pain. She is having right sided numbness into her thigh and hip. She will want an injection in the future. She is having aching pain. She has used some [employer] which was effective.   Pharmacotherapy Assessment  Analgesic:Hydrocodone/APAP 5/325 one every 6 hours (20 mg/day of hydrocodone) MME/day:20 mg/day  Monitoring: Pharmacotherapy: No side-effects or adverse reactions reported. [place] PMP: PDMP not reviewed this encounter.       Compliance: No problems identified. Plan: Refer to "POC". Evaluation of last interventional procedure  08/15/2018 Procedure: Bilateral knee injections Pre-procedure pain score:  6/10 Post-procedure pain score: 0/10         Influential Factors: Intra-procedural challenges: None observed.         Reported side-effects: None.        Post-procedural adverse reactions or complications: None reported         Sedation: Please see nurses note for DOS. When no sedatives are used, the analgesic levels obtained are directly associated to the effectiveness of the local anesthetics. However, when sedation is provided,  the level of analgesia obtained during the initial 1 hour following the intervention, is believed to be the result of a combination of factors. These factors may include, but are not limited to: 1. The effectiveness of the local anesthetics used. 2. The effects of the analgesic(s) and/or anxiolytic(s) used. 3. The degree of discomfort experienced by the patient at the time of the procedure. 4. The patients ability and reliability in recalling and recording the events. 5. The presence and influence of possible secondary gains and/or psychosocial factors. Reported result: Relief experienced during the 1st hour after the procedure:   (Ultra-Short Term Relief)            Interpretative annotation: Clinically appropriate result. Analgesia during this period is likely to be Local Anesthetic and/or IV Sedative (Analgesic/Anxiolytic) related.          Effects of local anesthetic: The analgesic effects attained during this period are directly associated to the localized infiltration of local anesthetics and therefore cary significant diagnostic value as to the etiological location, or anatomical origin, of the pain. Expected duration of relief is directly dependent on the pharmacodynamics of the local anesthetic used. Long-acting (4-6 hours) anesthetics used.  Reported result: Relief during the next 4 to 6 hour after the procedure:   (Short-Term Relief)            Interpretative annotation: Clinically appropriate result. Analgesia during this period is likely to be Local Anesthetic-related.          Long-term benefit: Defined as the period of time past the expected duration of local anesthetics (1 hour for short-acting and 4-6 hours for long-acting). With the possible exception of prolonged sympathetic blockade from the local anesthetics, benefits during this period are typically attributed to, or associated with, other factors such as analgesic sensory neuropraxia, antiinflammatory effects, or beneficial  biochemical changes provided by agents other than the local anesthetics.  Reported result: Extended relief following procedure:   (Long-Term Relief)            Interpretative annotation: Clinically appropriate result. Good relief. No permanent benefit expected. Inflammation plays a part in the etiology to the pain.         Review of recent tests  MM 3D SCREEN BREAST BILATERAL CLINICAL DATA:  Screening.  EXAM: DIGITAL SCREENING BILATERAL MAMMOGRAM WITH TOMO AND CAD  COMPARISON:  Previous exam(s).  ACR Breast Density Category c: The breast tissue is heterogeneously dense, which may obscure small masses.  FINDINGS: There are no findings suspicious for malignancy. Images were processed with CAD.  IMPRESSION: No mammographic evidence of malignancy. A result letter of this screening mammogram will be mailed directly to the patient.  RECOMMENDATION: Screening mammogram in one year. (Code:SM-B-01Y)  BI-RADS CATEGORY  1: Negative.  Electronically Signed   By: Dorise Bullion III M.D   On: 08/08/2018 17:24   Office Visit on 09/23/2018  Component Date Value Ref Range Status  . Vitamin B-12 09/23/2018 252  211 - 911 pg/mL Final  . Folate 09/23/2018 7.0  >5.9 ng/mL Final  . Magnesium 09/23/2018 1.9  1.5 - 2.5 mg/dL Final   Assessment  The primary encounter diagnosis was Lumbar spondylosis. Diagnoses of Fibromyalgia, Neurogenic pain, Primary osteoarthritis involving multiple joints, and Chronic pain syndrome were also pertinent to this visit.  Plan of Care  I am having Casondra L. Wroblewski maintain her aspirin EC, Vitamin D, atorvastatin, hydrochlorothiazide, sertraline, lisinopril, gabapentin, HYDROcodone-acetaminophen, HYDROcodone-acetaminophen, HYDROcodone-acetaminophen, and cyclobenzaprine.  Pharmacotherapy (Medications Ordered): Meds ordered this encounter  Medications  .  gabapentin (NEURONTIN) 300 MG capsule    Sig: Take 1 capsule (300 mg total) by mouth 3 (three) times daily.     Dispense:  90 capsule    Refill:  2    Do not place this medication, or any other prescription from our practice, on "Automatic Refill". Patient may have prescription filled one day early if pharmacy is closed on scheduled refill date.    Order Specific Question:   Supervising Provider    Answer:   Milinda Pointer [phone]  . HYDROcodone-acetaminophen (NORCO/VICODIN) 5-325 MG tablet    Sig: Take 1 tablet by mouth every 6 (six) hours as needed for up to 30 days for severe pain.    Dispense:  120 tablet    Refill:  0    Do not place this medication, or any other prescription from our practice, on "Automatic Refill". Patient may have prescription filled one day early if pharmacy is closed on scheduled refill date.    Order Specific Question:   Supervising Provider    Answer:   Milinda Pointer [phone]  . HYDROcodone-acetaminophen (NORCO/VICODIN) 5-325 MG tablet    Sig: Take 1 tablet by mouth every 6 (six) hours as needed for up to 30 days for severe pain.    Dispense:  120 tablet    Refill:  0    Do not place this medication, or any other prescription from our practice, on "Automatic Refill". Patient may have prescription filled one day early if pharmacy is closed on scheduled refill date.    Order Specific Question:   Supervising Provider    Answer:   Milinda Pointer [phone]  . HYDROcodone-acetaminophen (NORCO/VICODIN) 5-325 MG tablet    Sig: Take 1 tablet by mouth every 6 (six) hours as needed for up to 30 days for moderate pain.    Dispense:  120 tablet    Refill:  0    Do not place this medication, or any other prescription from our practice, on "Automatic Refill". Patient may have prescription filled one day early if pharmacy is closed on scheduled refill date.    Order Specific Question:   Supervising Provider    Answer:   Milinda Pointer [phone]  . cyclobenzaprine (FLEXERIL) 10 MG tablet    Sig: Take 1 tablet (10 mg total) by mouth at bedtime.    Dispense:  30 tablet     Refill:  2    Order Specific Question:   Supervising Provider    Answer:   Milinda Pointer [phone]   Orders:  No orders of the defined types were placed in this encounter.  Follow-up plan:   Return in about 3 months (around 02/10/2019) for MedMgmt.   I discussed the assessment and treatment plan with the patient. The patient was provided an opportunity to ask questions and all were answered. The patient agreed with the plan and demonstrated an understanding of the instructions.  Patient advised to call back or seek an in-person evaluation if the symptoms or condition worsens.  Total duration of non-face-to-face encounter: 15 minutes.  Note by: Dionisio David, NP Date: 11/11/2018; Time: 12:35 PM  Disclaimer:  * Given the special circumstances of the COVID-19 pandemic, the federal government has announced that the Office for Civil Rights (OCR) will exercise its enforcement discretion and will not impose penalties on physicians using telehealth in the event of noncompliance with regulatory requirements under the [place] (HIPAA) in connection with the good faith provision of telehealth during the COVID-19  Retail banker. ([place])

## 2018-11-11 NOTE — Patient Instructions (Signed)
____________________________________________________________________________________________  Medication Rules  Purpose: To inform patients, and their family members, of our rules and regulations.  Applies to: All patients receiving prescriptions (written or electronic).  Pharmacy of record: Pharmacy where electronic prescriptions will be sent. If written prescriptions are taken to a different pharmacy, please inform the nursing staff. The pharmacy listed in the electronic medical record should be the one where you would like electronic prescriptions to be sent.  Electronic prescriptions: In compliance with the [place] Strengthen Opioid Misuse Prevention (STOP) Act of 2017 (Session Law 2017-74/H243), effective July 24, 2018, all controlled substances must be electronically prescribed. Calling prescriptions to the pharmacy will cease to exist.  Prescription refills: Only during scheduled appointments. Applies to all prescriptions.  NOTE: The following applies primarily to controlled substances (Opioid* Pain Medications).   Patient's responsibilities: 1. Pain Pills: Bring all pain pills to every appointment (except for procedure appointments). 2. Pill Bottles: Bring pills in original pharmacy bottle. Always bring the newest bottle. Bring bottle, even if empty. 3. Medication refills: You are responsible for knowing and keeping track of what medications you take and those you need refilled. The day before your appointment: write a list of all prescriptions that need to be refilled. The day of the appointment: give the list to the admitting nurse. Prescriptions will be written only during appointments. No prescriptions will be written on procedure days. If you forget a medication: it will not be "Called in", "Faxed", or "electronically sent". You will need to get another appointment to get these prescribed. No early refills. Do not call asking to have your prescription filled  early. 4. Prescription Accuracy: You are responsible for carefully inspecting your prescriptions before leaving our office. Have the discharge nurse carefully go over each prescription with you, before taking them home. Make sure that your name is accurately spelled, that your address is correct. Check the name and dose of your medication to make sure it is accurate. Check the number of pills, and the written instructions to make sure they are clear and accurate. Make sure that you are given enough medication to last until your next medication refill appointment. 5. Taking Medication: Take medication as prescribed. When it comes to controlled substances, taking less pills or less frequently than prescribed is permitted and encouraged. Never take more pills than instructed. Never take medication more frequently than prescribed.  6. Inform other Doctors: Always inform, all of your healthcare providers, of all the medications you take. 7. Pain Medication from other Providers: You are not allowed to accept any additional pain medication from any other Doctor or Healthcare provider. There are two exceptions to this rule. (see below) In the event that you require additional pain medication, you are responsible for notifying us, as stated below. 8. Medication Agreement: You are responsible for carefully reading and following our Medication Agreement. This must be signed before receiving any prescriptions from our practice. Safely store a copy of your signed Agreement. Violations to the Agreement will result in no further prescriptions. (Additional copies of our Medication Agreement are available upon request.) 9. Laws, Rules, & Regulations: All patients are expected to follow all Federal and State Laws, Statutes, Rules, & Regulations. Ignorance of the Laws does not constitute a valid excuse. The use of any illegal substances is prohibited. 10. Adopted CDC guidelines & recommendations: Target dosing levels will be  at or below 60 MME/day. Use of benzodiazepines** is not recommended.  Exceptions: There are only two exceptions to the rule of not   receiving pain medications from other Healthcare Providers. 1. Exception #1 (Emergencies): In the event of an emergency (i.e.: accident requiring emergency care), you are allowed to receive additional pain medication. However, you are responsible for: As soon as you are able, call our office [phone], at any time of the day or night, and leave a message stating your name, the date and nature of the emergency, and the name and dose of the medication prescribed. In the event that your call is answered by a member of our staff, make sure to document and save the date, time, and the name of the person that took your information.  2. Exception #2 (Planned Surgery): In the event that you are scheduled by another doctor or dentist to have any type of surgery or procedure, you are allowed (for a period no longer than 30 days), to receive additional pain medication, for the acute post-op pain. However, in this case, you are responsible for picking up a copy of our "Post-op Pain Management for Surgeons" handout, and giving it to your surgeon or dentist. This document is available at our office, and does not require an appointment to obtain it. Simply go to our office during business hours (Monday-Thursday from 8:00 AM to 4:00 PM) (Friday 8:00 AM to 12:00 Noon) or if you have a scheduled appointment with us, prior to your surgery, and ask for it by name. In addition, you will need to provide us with your name, name of your surgeon, type of surgery, and date of procedure or surgery.  *Opioid medications include: morphine, codeine, oxycodone, oxymorphone, hydrocodone, hydromorphone, meperidine, tramadol, tapentadol, buprenorphine, fentanyl, methadone. **Benzodiazepine medications include: diazepam (Valium), alprazolam (Xanax), clonazepam (Klonopine), lorazepam (Ativan), clorazepate  (Tranxene), chlordiazepoxide (Librium), estazolam (Prosom), oxazepam (Serax), temazepam (Restoril), triazolam (Halcion) (Last updated: 09/20/2017) ____________________________________________________________________________________________    

## 2018-11-21 ENCOUNTER — Telehealth: Payer: Self-pay | Admitting: Nurse Practitioner

## 2018-11-21 NOTE — Telephone Encounter (Signed)
Pt called stating that she had her virtual visit with Crystal last week and she was supposed to send in refills and the pharmacy states they have not received any yet.

## 2018-11-21 NOTE — Telephone Encounter (Signed)
Returned the patient's call.  Called to pharmacy, [facility] in [place] and they do have her Rx for hydrocodone ready to be picked up.  Today was the earliest time she could pick up based on last fill.

## 2018-12-24 ENCOUNTER — Ambulatory Visit (INDEPENDENT_AMBULATORY_CARE_PROVIDER_SITE_OTHER): Payer: Medicare HMO | Admitting: Internal Medicine

## 2018-12-24 ENCOUNTER — Other Ambulatory Visit: Payer: Self-pay

## 2018-12-24 DIAGNOSIS — E785 Hyperlipidemia, unspecified: Secondary | ICD-10-CM

## 2018-12-24 DIAGNOSIS — I10 Essential (primary) hypertension: Secondary | ICD-10-CM

## 2018-12-24 DIAGNOSIS — N390 Urinary tract infection, site not specified: Secondary | ICD-10-CM | POA: Diagnosis not present

## 2018-12-24 DIAGNOSIS — R309 Painful micturition, unspecified: Secondary | ICD-10-CM

## 2018-12-24 LAB — POC URINALSYSI DIPSTICK (AUTOMATED)
Bilirubin, UA: NEGATIVE
Blood, UA: NEGATIVE
Glucose, UA: NEGATIVE
Ketones, UA: NEGATIVE
Leukocytes, UA: NEGATIVE
Nitrite, UA: NEGATIVE
Protein, UA: POSITIVE — AB
Spec Grav, UA: 1.015 (ref 1.010–1.025)
Urobilinogen, UA: 1 E.U./dL
pH, UA: 7.5 (ref 5.0–8.0)

## 2018-12-24 LAB — CBC WITH DIFFERENTIAL/PLATELET
Basophils Absolute: 0 10*3/uL (ref 0.0–0.1)
Basophils Relative: 0.9 % (ref 0.0–3.0)
Eosinophils Absolute: 0.1 10*3/uL (ref 0.0–0.7)
Eosinophils Relative: 2.9 % (ref 0.0–5.0)
HCT: 40.6 % (ref 36.0–46.0)
Hemoglobin: 13.8 g/dL (ref 12.0–15.0)
Lymphocytes Relative: 42.9 % (ref 12.0–46.0)
Lymphs Abs: 1.9 10*3/uL (ref 0.7–4.0)
MCHC: 34.1 g/dL (ref 30.0–36.0)
MCV: 94.2 fl (ref 78.0–100.0)
Monocytes Absolute: 0.5 10*3/uL (ref 0.1–1.0)
Monocytes Relative: 10.6 % (ref 3.0–12.0)
Neutro Abs: 1.9 10*3/uL (ref 1.4–7.7)
Neutrophils Relative %: 42.7 % — ABNORMAL LOW (ref 43.0–77.0)
Platelets: 167 10*3/uL (ref 150.0–400.0)
RBC: 4.31 Mil/uL (ref 3.87–5.11)
RDW: 15.6 % — ABNORMAL HIGH (ref 11.5–15.5)
WBC: 4.5 10*3/uL (ref 4.0–10.5)

## 2018-12-24 LAB — BASIC METABOLIC PANEL
BUN: 12 mg/dL (ref 6–23)
CO2: 30 mEq/L (ref 19–32)
Calcium: 9.5 mg/dL (ref 8.4–10.5)
Chloride: 101 mEq/L (ref 96–112)
Creatinine, Ser: 0.67 mg/dL (ref 0.40–1.20)
GFR: 105.13 mL/min (ref >60.00)
Glucose, Bld: 78 mg/dL (ref 70–99)
Potassium: 4 mEq/L (ref 3.5–5.1)
Sodium: 137 mEq/L (ref 135–145)

## 2018-12-24 LAB — URINALYSIS, MICROSCOPIC ONLY

## 2018-12-24 NOTE — Progress Notes (Signed)
Subjective:    Patient ID: Wanda [name], female    DOB: 1946-01-03, 73 y.o.   MRN: [id number]  DOS:  12/24/2018 Type of visit - description: Virtual Visit via Video Note  I connected with@ on 12/25/18 at  1:20 PM EDT by a video enabled telemedicine application and verified that I am speaking with the correct person using two identifiers.   THIS ENCOUNTER IS A VIRTUAL VISIT DUE TO COVID-19 - PATIENT WAS NOT SEEN IN THE OFFICE. PATIENT HAS CONSENTED TO VIRTUAL VISIT / TELEMEDICINE VISIT   Location of patient: home  Location of provider: office  I discussed the limitations of evaluation and management by telemedicine and the availability of in person appointments. The patient expressed understanding and agreed to proceed.  History of Present Illness: Acute Symptoms of started 3 days ago with lower back discomfort different to her typical MSK type of pain associated with urinary frequency and dysuria. Symptoms feel like her previous UTIs.     Review of Systems No fever chills No nausea or vomiting Question of mild suprapubic discomfort No vaginal bleeding or vaginal discharge.  Past Medical History:  Diagnosis Date  . Allergy    RHINITIS  . Chronic back pain   . Depression   . DJD (degenerative joint disease)    back pain , on disability  . Hyperlipidemia   . Hypertension   . Menopause    onset age 56   . Musculoskeletal pain 12/07/2015  . TMJ PAIN 05/17/2010   Qualifier: Diagnosis of  By: Larose Kells MD, [place]     Past Surgical History:  Procedure Laterality Date  . G3 P3       Social History   Socioeconomic History  . Marital status: Married    Spouse name: Not on file  . Number of children: 3  . Years of education: Not on file  . Highest education level: Not on file  Occupational History  . Occupation: ON DISABILITY  Social Needs  . Financial resource strain: Not on file  . Food insecurity:    Worry: Not on file    Inability: Not on file  .  Transportation needs:    Medical: Not on file    Non-medical: Not on file  Tobacco Use  . Smoking status: Never Smoker  . Smokeless tobacco: Never Used  Substance and Sexual Activity  . Alcohol use: No  . Drug use: No  . Sexual activity: Not Currently  Lifestyle  . Physical activity:    Days per week: Not on file    Minutes per session: Not on file  . Stress: Not on file  Relationships  . Social connections:    Talks on phone: Not on file    Gets together: Not on file    Attends religious service: Not on file    Active member of club or organization: Not on file    Attends meetings of clubs or organizations: Not on file    Relationship status: Not on file  . Intimate partner violence:    Fear of current or ex partner: Not on file    Emotionally abused: Not on file    Physically abused: Not on file    Forced sexual activity: Not on file  Other Topics Concern  . Not on file  Social History Narrative   Married, 3 kids, 6 G-kids    Household-- pt, husband, son      Allergies as of 12/24/2018  Reactions   Simvastatin    REACTION: cramps      Medication List       Accurate as of December 24, 2018 11:59 PM. If you have any questions, ask your nurse or doctor.        aspirin EC 81 MG tablet Take 81 mg by mouth daily.   atorvastatin 10 MG tablet Commonly known as:  LIPITOR Take 1 tablet (10 mg total) by mouth at bedtime.   cyclobenzaprine 10 MG tablet Commonly known as:  FLEXERIL Take 1 tablet (10 mg total) by mouth at bedtime.   gabapentin 300 MG capsule Commonly known as:  NEURONTIN Take 1 capsule (300 mg total) by mouth 3 (three) times daily.   hydrochlorothiazide 25 MG tablet Commonly known as:  HYDRODIURIL Take 1 tablet (25 mg total) by mouth daily.   HYDROcodone-acetaminophen 5-325 MG tablet Commonly known as:  NORCO/VICODIN Take 1 tablet by mouth every 6 (six) hours as needed for up to 30 days for moderate pain.   HYDROcodone-acetaminophen 5-325 MG  tablet Commonly known as:  NORCO/VICODIN Take 1 tablet by mouth every 6 (six) hours as needed for up to 30 days for severe pain.   HYDROcodone-acetaminophen 5-325 MG tablet Commonly known as:  NORCO/VICODIN Take 1 tablet by mouth every 6 (six) hours as needed for up to 30 days for severe pain. Start taking on:  January 11, 2019   lisinopril 10 MG tablet Commonly known as:  ZESTRIL Take 1 tablet (10 mg total) by mouth daily.   sertraline 50 MG tablet Commonly known as:  ZOLOFT Take 3 tablets (150 mg total) by mouth daily.   Vitamin D 50 MCG (2000 UT) tablet Take 2,000 Units by mouth daily.           Objective:   Physical Exam There were no vitals taken for this visit. This is a virtual video visit.  Alert oriented x3, no apparent distress    Assessment      Assessment HTN Hyperlipidemia Depression Morbid obesity MSK: DJD, chronic back-knee pain, hydrocodone /gabapentin rx by DR Dossie Arbour  PLAN: UTI: sxs c/w UTI.  We will get a UA, urine culture, further advised with results. HTN: Currently on HCTZ, lisinopril, check a BMP and CBC. Hyperlipidemia: See last visit, we did a trial holding atorvastatin to see if that improved her leg cramps, it did not.  She is back on statins. Plan: Labs today, CPX already scheduled for 03-2019.      I discussed the assessment and treatment plan with the patient. The patient was provided an opportunity to ask questions and all were answered. The patient agreed with the plan and demonstrated an understanding of the instructions.   The patient was advised to call back or seek an in-person evaluation if the symptoms worsen or if the condition fails to improve as anticipated.

## 2018-12-25 NOTE — Assessment & Plan Note (Signed)
UTI: sxs c/w UTI.  We will get a UA, urine culture, further advised with results. HTN: Currently on HCTZ, lisinopril, check a BMP and CBC. Hyperlipidemia: See last visit, we did a trial holding atorvastatin to see if that improved her leg cramps, it did not.  She is back on statins. Plan: Labs today, CPX already scheduled for 03-2019.

## 2018-12-26 LAB — URINE CULTURE
MICRO NUMBER:: 528456
Result:: NO GROWTH
SPECIMEN QUALITY:: ADEQUATE

## 2019-01-30 ENCOUNTER — Encounter: Payer: Self-pay | Admitting: Pain Medicine

## 2019-01-31 DIAGNOSIS — M899 Disorder of bone, unspecified: Secondary | ICD-10-CM | POA: Insufficient documentation

## 2019-01-31 DIAGNOSIS — Z79899 Other long term (current) drug therapy: Secondary | ICD-10-CM | POA: Insufficient documentation

## 2019-01-31 DIAGNOSIS — Z789 Other specified health status: Secondary | ICD-10-CM | POA: Insufficient documentation

## 2019-01-31 NOTE — Progress Notes (Addendum)
Pain Management Virtual Encounter Note - Virtual Visit via Telephone Telehealth (real-time audio visits between healthcare provider and patient).   Patient's Phone No. & Preferred Pharmacy:  [phone] (home); [phone] (mobile); (Preferred) [phone] No e-mail address on record  [facility] [place] [address] Phone: [phone] Fax: [phone]    Pre-screening note:  Our staff contacted Wanda [name] and offered her an "in person", "face-to-face" appointment versus a telephone encounter. She indicated preferring the telephone encounter, at this time.   Reason for Virtual Visit: COVID-19*  Social distancing based on CDC and AMA recommendations.   I contacted Wanda [name] on 02/03/2019 via telephone.      I clearly identified myself as Gaspar Cola, MD. I verified that I was speaking with the correct person using two identifiers (Name: Wanda ROTERT, and date of birth: [birth year]).  Advanced Informed Consent I sought verbal advanced consent from Wanda [name] for virtual visit interactions. I informed Wanda [name] of possible security and privacy concerns, risks, and limitations associated with providing "not-in-person" medical evaluation and management services. I also informed [name] of the availability of "in-person" appointments. Finally, I informed her that there would be a charge for the virtual visit and that she could be  personally, fully or partially, financially responsible for it. Wanda [name] expressed understanding and agreed to proceed.   Historic Elements   [name] is a 73 y.o. year old, female patient evaluated today after her last encounter by our practice on 11/21/2018. [name]  has a past medical history of Allergy, Chronic back pain, Depression, DJD (degenerative joint disease), Hyperlipidemia, Hypertension,  Menopause, Musculoskeletal pain (12/07/2015), and TMJ PAIN (05/17/2010). She also  has a past surgical history that includes G3 P3 . Wanda [name] has a current medication list which includes the following prescription(s): aspirin ec, atorvastatin, vitamin d, cyclobenzaprine, gabapentin, hydrochlorothiazide, hydrocodone-acetaminophen, hydrocodone-acetaminophen, hydrocodone-acetaminophen, lisinopril, and sertraline. She  reports that she has never smoked. She has never used smokeless tobacco. She reports that she does not drink alcohol or use drugs. Wanda [name] is allergic to simvastatin.   HPI  Today, she is being contacted for medication management.  The patient refers doing well on her current medication regimen without any side effects or complications.  Today we talked about the possibility of going up on her gabapentin, but she was not really sure if this was making her little sleepy and therefore I decided to just keep the same dose.  She is having more pain in the area of the left knee and she has requested to come in for an intra-articular left knee injection.  The last injection done in August 15, 2018 was a bilateral intra-articular knee joint injection with steroids.  She did complete a series of 5 intra-articular Hyalgan knee injections on the left knee on 01/15/2018, more than a year ago.  I will give her the option of trying another series.  Ultimately, what she needs is to lose some weight so as to improve her osteoarthritis.  She is still at 242 pounds.  In addition, we need to consider the possibility of doing a diagnostic left-sided genicular nerve block, which if effective, we may then move onto a radiofrequency ablation.  Pharmacotherapy Assessment  Analgesic: Hydrocodone/APAP 5/325 one every 6 hours (20 mg/day of hydrocodone) MME/day:20 mg/day.   Monitoring: Pharmacotherapy: No side-effects or adverse reactions reported. [place] PMP: PDMP  reviewed during this encounter.       Compliance: No  problems identified. Effectiveness: Clinically acceptable. Plan: Refer to "POC".  Pertinent Labs   SAFETY SCREENING Profile Lab Results  Component Value Date   HCVAB NEGATIVE 08/30/2016   Renal Function Lab Results  Component Value Date   BUN 12 12/24/2018   CREATININE 0.67 12/24/2018   GFRAA >60 08/18/2015   GFRNONAA >60 08/18/2015   Hepatic Function Lab Results  Component Value Date   AST 19 12/24/2017   ALT 18 12/24/2017   ALBUMIN 4.0 12/24/2017   UDS Summary  Date Value Ref Range Status  05/16/2018 FINAL  Final    Comment:    ==================================================================== TOXASSURE SELECT 13 (MW) ==================================================================== Test                             Result       Flag       Units Drug Present and Declared for Prescription Verification   Hydrocodone                    695          EXPECTED   ng/mg creat   Hydromorphone                  127          EXPECTED   ng/mg creat   Norhydrocodone                 1165         EXPECTED   ng/mg creat    Sources of hydrocodone include scheduled prescription    medications. Hydromorphone and norhydrocodone are expected    metabolites of hydrocodone. Hydromorphone is also available as a    scheduled prescription medication. ==================================================================== Test                      Result    Flag   Units      Ref Range   Creatinine              243              mg/dL      >=20 ==================================================================== Declared Medications:  The flagging and interpretation on this report are based on the  following declared medications.  Unexpected results may arise from  inaccuracies in the declared medications.  **Note: The testing scope of this panel includes these medications:  Hydrocodone (Hydrocodone-Acetaminophen)  **Note: The testing scope of this panel does not include following   reported medications:  Acetaminophen (Hydrocodone-Acetaminophen)  Aspirin (Aspirin 81)  Atorvastatin  Cholecalciferol  Cyclobenzaprine  Gabapentin  Hydrochlorothiazide  Lisinopril  Sertraline ==================================================================== For clinical consultation, please call [phone]. ====================================================================    Note: Above Lab results reviewed.  Recent imaging  MM 3D SCREEN BREAST BILATERAL CLINICAL DATA:  Screening.  EXAM: DIGITAL SCREENING BILATERAL MAMMOGRAM WITH TOMO AND CAD  COMPARISON:  Previous exam(s).  ACR Breast Density Category c: The breast tissue is heterogeneously dense, which may obscure small masses.  FINDINGS: There are no findings suspicious for malignancy. Images were processed with CAD.  IMPRESSION: No mammographic evidence of malignancy. A result letter of this screening mammogram will be mailed directly to the patient.  RECOMMENDATION: Screening mammogram in one year. (Code:[id number]-B-01Y)  BI-RADS CATEGORY  1: Negative.  Electronically Signed   By: Dorise Bullion III M.D   On: 08/08/2018 17:24  Assessment  The primary encounter diagnosis was Chronic pain syndrome. Diagnoses of Chronic knee pain (Bilateral) (L>R), Osteoarthritis of knee (Bilateral), Grade 1 (18mm) Anterolisthesis of L3 over L4., Chronic musculoskeletal pain, Fibromyalgia, Neurogenic pain, Pharmacologic therapy, Disorder of skeletal system, Problems influencing health status, and Vitamin D deficiency were also pertinent to this visit.  Plan of Care  I have discontinued Ashantia L. Murtagh's HYDROcodone-acetaminophen and HYDROcodone-acetaminophen. I have also changed her HYDROcodone-acetaminophen. Additionally, I am having her start on HYDROcodone-acetaminophen and HYDROcodone-acetaminophen. Lastly, I am having her maintain her aspirin EC, Vitamin D, atorvastatin, hydrochlorothiazide, sertraline, lisinopril,  gabapentin, and cyclobenzaprine.  Pharmacotherapy (Medications Ordered): Meds ordered this encounter  Medications  . HYDROcodone-acetaminophen (NORCO/VICODIN) 5-325 MG tablet    Sig: Take 1 tablet by mouth every 6 (six) hours as needed for severe pain. Must last 30 days    Dispense:  120 tablet    Refill:  0    Chronic Pain: STOP Act (Not applicable) Fill 1 day early if closed on refill date. Do not fill until: 02/10/2019. To last until: 03/12/2019. Avoid benzodiazepines within 8 hours of opioids  . gabapentin (NEURONTIN) 300 MG capsule    Sig: Take 1 capsule (300 mg total) by mouth 3 (three) times daily.    Dispense:  90 capsule    Refill:  2    Fill one day early if pharmacy is closed on scheduled refill date. May substitute for generic if available.  . cyclobenzaprine (FLEXERIL) 10 MG tablet    Sig: Take 1 tablet (10 mg total) by mouth at bedtime.    Dispense:  30 tablet    Refill:  2    Fill one day early if pharmacy is closed on scheduled refill date. May substitute for generic if available.  [name] HYDROcodone-acetaminophen (NORCO/VICODIN) 5-325 MG tablet    Sig: Take 1 tablet by mouth every 6 (six) hours as needed for severe pain. Must last 30 days    Dispense:  120 tablet    Refill:  0    Chronic Pain: STOP Act (Not applicable) Fill 1 day early if closed on refill date. Do not fill until: 03/12/2019. To last until: 04/11/2019. Avoid benzodiazepines within 8 hours of opioids  . HYDROcodone-acetaminophen (NORCO/VICODIN) 5-325 MG tablet    Sig: Take 1 tablet by mouth every 6 (six) hours as needed for severe pain. Must last 30 days    Dispense:  120 tablet    Refill:  0    Chronic Pain: STOP Act (Not applicable) Fill 1 day early if closed on refill date. Do not fill until: 04/11/2019. To last until: 05/11/2019. Avoid benzodiazepines within 8 hours of opioids   Orders:  Orders Placed This Encounter  Procedures  . KNEE INJECTION    Hyalgan knee injection. Please order Hyalgan.    Standing  Status:   Future    Standing Expiration Date:   03/06/2019    Scheduling Instructions:     Procedure: Intra-articular Hyalgan Knee injection #1     Side: Left-sided     Sedation: None     Timeframe: ASAA    Order Specific Question:   Where will this procedure be performed?    Answer:   [facility]  . KNEE INJECTION    Local Anesthetic & Steroid injection.    Standing Status:   Future    Standing Expiration Date:   03/05/2019    Scheduling Instructions:     Side: Left-sided     Sedation: None  Timeframe: As soon as schedule allows    Order Specific Question:   Where will this procedure be performed?    Answer:   [facility]  . ToxASSURE Select 13 (MW), Urine    Volume: 30 ml(s). Minimum 3 ml of urine is needed. Document temperature of fresh sample. Indications: Long term (current) use of opiate analgesic (Z79.891)  . Comp. Metabolic Panel (12)    With GFR. Indications: Chronic Pain Syndrome (G89.4) & Pharmacotherapy (P53.614)    Order Specific Question:   Has the patient fasted?    Answer:   No    Order Specific Question:   CC Results    Answer:   PCP-NURSE [[id number]]  . Magnesium    Indication: Pharmacologic therapy (G86.761)    Order Specific Question:   CC Results    Answer:   PCP-NURSE [[id number]]  . Vitamin B12    Indication: Pharmacologic therapy (I71.245).    Order Specific Question:   CC Results    Answer:   PCP-NURSE [[id number]]  . Sedimentation rate    Indication: Disorder of skeletal system (M89.9)    Order Specific Question:   CC Results    Answer:   PCP-NURSE [[id number]]  . 25-Hydroxyvitamin D Lcms D2+D3    Indication: Disorder of skeletal system (M89.9).    Order Specific Question:   CC Results    Answer:   PCP-NURSE [[id number]]  . C-reactive protein    Indication: Problems influencing health status (Z78.9)    Order Specific Question:   CC Results    Answer:   PCP-NURSE [[id number]]   Follow-up plan:   Return in about 13 weeks (around 05/05/2019) for  (VV), E/M (MM), in addition, Procedure (no sedation): (L) Knee inj.     Considering:  NOTE: NO RFA until BMI <35. Diagnostic/palliative left intra-articular knee injection.  Diagnostic bilateral lumbar facet block #3 Diagnostic left L4-5 LESI    Palliative PRN treatment(s):  Palliative bilateral lumbar facet block #3  Palliative left intra-articular Hyalgan knee injection #S2/N1  Palliative left L4-5 LESI  Palliative bilateral intra-articular knee injection #3 (w/ steroid) (last done 08/15/18)     Recent Visits Date Type Provider Dept  11/11/18 Office Visit Vevelyn Francois, NP [facility] recent visits within past 90 days and meeting all other requirements   Today's Visits Date Type Provider Dept  02/03/19 Office Visit Milinda Pointer, MD [facility]  Showing today's visits and meeting all other requirements   Future Appointments No visits were found meeting these conditions.  Showing future appointments within next 90 days and meeting all other requirements   I discussed the assessment and treatment plan with the patient. The patient was provided an opportunity to ask questions and all were answered. The patient agreed with the plan and demonstrated an understanding of the instructions.  Patient advised to call back or seek an in-person evaluation if the symptoms or condition worsens.  Total duration of non-face-to-face encounter: 15 minutes.  Note by: Gaspar Cola, MD Date: 02/03/2019; Time: 12:33 PM  Note: This dictation was prepared with Dragon dictation. Any transcriptional errors that may result from this process are unintentional.  Disclaimer:  * Given the special circumstances of the COVID-19 pandemic, the federal government has announced that the Office for Civil Rights (OCR) will exercise its enforcement discretion and will not impose penalties on physicians using telehealth in the event of noncompliance with regulatory  requirements under the [place] (HIPAA) in  connection with the good faith provision of telehealth during the EMLJQ-49 national public health emergency. (AMA)

## 2019-02-03 ENCOUNTER — Other Ambulatory Visit: Payer: Self-pay

## 2019-02-03 ENCOUNTER — Ambulatory Visit: Payer: Medicare HMO | Attending: Nurse Practitioner | Admitting: Pain Medicine

## 2019-02-03 DIAGNOSIS — M797 Fibromyalgia: Secondary | ICD-10-CM | POA: Diagnosis not present

## 2019-02-03 DIAGNOSIS — M25562 Pain in left knee: Secondary | ICD-10-CM

## 2019-02-03 DIAGNOSIS — M7918 Myalgia, other site: Secondary | ICD-10-CM

## 2019-02-03 DIAGNOSIS — M4310 Spondylolisthesis, site unspecified: Secondary | ICD-10-CM

## 2019-02-03 DIAGNOSIS — Z79899 Other long term (current) drug therapy: Secondary | ICD-10-CM

## 2019-02-03 DIAGNOSIS — M25561 Pain in right knee: Secondary | ICD-10-CM

## 2019-02-03 DIAGNOSIS — G8929 Other chronic pain: Secondary | ICD-10-CM

## 2019-02-03 DIAGNOSIS — G894 Chronic pain syndrome: Secondary | ICD-10-CM

## 2019-02-03 DIAGNOSIS — E559 Vitamin D deficiency, unspecified: Secondary | ICD-10-CM

## 2019-02-03 DIAGNOSIS — M792 Neuralgia and neuritis, unspecified: Secondary | ICD-10-CM

## 2019-02-03 DIAGNOSIS — M899 Disorder of bone, unspecified: Secondary | ICD-10-CM | POA: Diagnosis not present

## 2019-02-03 DIAGNOSIS — M170 Bilateral primary osteoarthritis of knee: Secondary | ICD-10-CM

## 2019-02-03 DIAGNOSIS — Z789 Other specified health status: Secondary | ICD-10-CM

## 2019-02-03 MED ORDER — CYCLOBENZAPRINE HCL 10 MG PO TABS: 10.0000 mg | ORAL_TABLET | Freq: Every day | ORAL | 2 refills | Status: DC

## 2019-02-03 MED ORDER — HYDROCODONE-ACETAMINOPHEN 5-325 MG PO TABS: 1.0000 | ORAL_TABLET | Freq: Four times a day (QID) | ORAL | 0 refills | Status: DC | PRN

## 2019-02-03 MED ORDER — GABAPENTIN 300 MG PO CAPS: 300.0000 mg | ORAL_CAPSULE | Freq: Three times a day (TID) | ORAL | 2 refills | Status: DC

## 2019-02-03 NOTE — Addendum Note (Signed)
Addended by: Milinda Pointer A on: 02/03/2019 12:33 PM   Modules accepted: Orders, SmartSet

## 2019-02-03 NOTE — Patient Instructions (Signed)
____________________________________________________________________________________________  Preparing for your procedure (without sedation)  Procedure appointments are limited to planned procedures: . No Prescription Refills. . No disability issues will be discussed. . No medication changes will be discussed.  Instructions: . Oral Intake: Do not eat or drink anything for at least 3 hours prior to your procedure. . Transportation: Unless otherwise stated by your physician, you may drive yourself after the procedure. . Blood Pressure Medicine: Take your blood pressure medicine with a sip of water the morning of the procedure. . Blood thinners: Notify our staff if you are taking any blood thinners. Depending on which one you take, there will be specific instructions on how and when to stop it. . Diabetics on insulin: Notify the staff so that you can be scheduled 1st case in the morning. If your diabetes requires high dose insulin, take only  of your normal insulin dose the morning of the procedure and notify the staff that you have done so. . Preventing infections: Shower with an antibacterial soap the morning of your procedure.  . Build-up your immune system: Take 1000 mg of Vitamin C with every meal (3 times a day) the day prior to your procedure. . Antibiotics: Inform the staff if you have a condition or reason that requires you to take antibiotics before dental procedures. . Pregnancy: If you are pregnant, call and cancel the procedure. . Sickness: If you have a cold, fever, or any active infections, call and cancel the procedure. . Arrival: You must be in the facility at least 30 minutes prior to your scheduled procedure. . Children: Do not bring any children with you. . Dress appropriately: Bring dark clothing that you would not mind if they get stained. . Valuables: Do not bring any jewelry or valuables.  Reasons to call and reschedule or cancel your procedure: (Following these  recommendations will minimize the risk of a serious complication.) . Surgeries: Avoid having procedures within 2 weeks of any surgery. (Avoid for 2 weeks before or after any surgery). . Flu Shots: Avoid having procedures within 2 weeks of a flu shots or . (Avoid for 2 weeks before or after immunizations). . Barium: Avoid having a procedure within 7-10 days after having had a radiological study involving the use of radiological contrast. (Myelograms, Barium swallow or enema study). . Heart attacks: Avoid any elective procedures or surgeries for the initial 6 months after a "Myocardial Infarction" (Heart Attack). . Blood thinners: It is imperative that you stop these medications before procedures. Let us know if you if you take any blood thinner.  . Infection: Avoid procedures during or within two weeks of an infection (including chest colds or gastrointestinal problems). Symptoms associated with infections include: Localized redness, fever, chills, night sweats or profuse sweating, burning sensation when voiding, cough, congestion, stuffiness, runny nose, sore throat, diarrhea, nausea, vomiting, cold or Flu symptoms, recent or current infections. It is specially important if the infection is over the area that we intend to treat. . Heart and lung problems: Symptoms that may suggest an active cardiopulmonary problem include: cough, chest pain, breathing difficulties or shortness of breath, dizziness, ankle swelling, uncontrolled high or unusually low blood pressure, and/or palpitations. If you are experiencing any of these symptoms, cancel your procedure and contact your primary care physician for an evaluation.  Remember:  Regular Business hours are:  Monday to Thursday 8:00 AM to 4:00 PM  Provider's Schedule: Kalin Kyler, MD:  Procedure days: Tuesday and Thursday 7:30 AM to 4:00 PM  Bilal   Lateef, MD:  Procedure days: Monday and Wednesday 7:30 AM to 4:00  PM ____________________________________________________________________________________________    

## 2019-02-13 ENCOUNTER — Ambulatory Visit: Payer: Medicare HMO | Attending: Pain Medicine | Admitting: Pain Medicine

## 2019-02-13 ENCOUNTER — Other Ambulatory Visit: Payer: Self-pay

## 2019-02-13 ENCOUNTER — Encounter: Payer: Self-pay | Admitting: Pain Medicine

## 2019-02-13 VITALS — BP 122/97 | HR 79 | Temp 98.8°F | Resp 18 | Ht 64.0 in | Wt 242.0 lb

## 2019-02-13 DIAGNOSIS — E559 Vitamin D deficiency, unspecified: Secondary | ICD-10-CM | POA: Diagnosis not present

## 2019-02-13 DIAGNOSIS — Z789 Other specified health status: Secondary | ICD-10-CM | POA: Diagnosis not present

## 2019-02-13 DIAGNOSIS — M25561 Pain in right knee: Secondary | ICD-10-CM | POA: Diagnosis not present

## 2019-02-13 DIAGNOSIS — M170 Bilateral primary osteoarthritis of knee: Secondary | ICD-10-CM | POA: Insufficient documentation

## 2019-02-13 DIAGNOSIS — M25562 Pain in left knee: Secondary | ICD-10-CM | POA: Diagnosis not present

## 2019-02-13 DIAGNOSIS — G8929 Other chronic pain: Secondary | ICD-10-CM | POA: Insufficient documentation

## 2019-02-13 DIAGNOSIS — Z79899 Other long term (current) drug therapy: Secondary | ICD-10-CM | POA: Diagnosis not present

## 2019-02-13 DIAGNOSIS — M899 Disorder of bone, unspecified: Secondary | ICD-10-CM | POA: Diagnosis not present

## 2019-02-13 DIAGNOSIS — G894 Chronic pain syndrome: Secondary | ICD-10-CM | POA: Diagnosis not present

## 2019-02-13 MED ORDER — ROPIVACAINE HCL 2 MG/ML IJ SOLN
5.0000 mL | Freq: Once | INTRAMUSCULAR | Status: AC
  Administered 2019-02-13: 10 mL via INTRA_ARTICULAR

## 2019-02-13 MED ORDER — ROPIVACAINE HCL 2 MG/ML IJ SOLN
INTRAMUSCULAR | Status: AC
  Filled 2019-02-13: qty 10

## 2019-02-13 MED ORDER — LIDOCAINE HCL (PF) 1 % IJ SOLN
5.0000 mL | Freq: Once | INTRAMUSCULAR | Status: AC
  Administered 2019-02-13: 5 mL

## 2019-02-13 MED ORDER — LIDOCAINE HCL (PF) 1 % IJ SOLN
INTRAMUSCULAR | Status: AC
  Filled 2019-02-13: qty 5

## 2019-02-13 MED ORDER — LIDOCAINE HCL 2 % IJ SOLN: 20.0000 mL | Freq: Once | INTRAMUSCULAR | Status: DC

## 2019-02-13 MED ORDER — SODIUM HYALURONATE (VISCOSUP) 20 MG/2ML IX SOSY
2.0000 mL | PREFILLED_SYRINGE | Freq: Once | INTRA_ARTICULAR | Status: AC
  Administered 2019-02-13: 09:00:00 via INTRA_ARTICULAR

## 2019-02-13 NOTE — Patient Instructions (Signed)

## 2019-02-13 NOTE — Progress Notes (Signed)
Patient's Name: Wanda [name]  MRN: [id number]  Referring Provider: Milinda Pointer, MD  DOB: Nov 28, 1945  PCP: Colon Branch, MD  DOS: 02/13/2019  Note by: Gaspar Cola, MD  Service setting: Ambulatory outpatient  Specialty: Interventional Pain Management  Patient type: Established  Location: [facility] (AMB) Pain Management Facility  Visit type: Interventional Procedure   Primary Reason for Visit: Interventional Pain Management Treatment. CC: Knee Pain (left)  Procedure:          Anesthesia, Analgesia, Anxiolysis:  Type: Therapeutic Intra-Articular Hyalgan Knee Injection S2N1  Region: Lateral infrapatellar Knee Region Level: Knee Joint Laterality: Left knee  Type: Local Anesthesia Indication(s): Analgesia         Local Anesthetic: Lidocaine 1-2% Route: Infiltration ([place]/IM) IV Access: Declined Sedation: Declined   Position: Sitting   Indications: 1. Chronic knee pain (Bilateral) (L>R)   2. Osteoarthritis of knee (Bilateral)    Pain Score: Pre-procedure: 4 /10 Post-procedure: 4 /10   Pertinent Labs  COVID-19 screennig: No results found for: SARSCOV2NAA  Pre-op Assessment:  Wanda [name] is a 73 y.o. (year old), female patient, seen today for interventional treatment. She  has a past surgical history that includes G3 P3 . Wanda [name] has a current medication list which includes the following prescription(s): aspirin ec, atorvastatin, vitamin d, cyclobenzaprine, gabapentin, hydrochlorothiazide, hydrocodone-acetaminophen, hydrocodone-acetaminophen, hydrocodone-acetaminophen, lisinopril, and sertraline, and the following Facility-Administered Medications: lidocaine. Her primarily concern today is the Knee Pain (left)  Initial Vital Signs:  Pulse/HCG Rate: 79  Temp: 98.8 F (37.1 C) Resp: 18 BP: (!) 122/97 SpO2: 99 %  BMI: Estimated body mass index is 41.54 kg/m as calculated from the following:   Height as of this encounter: 5\' 4"  (1.626 m).   Weight as of this encounter:  242 lb (109.8 kg).  Risk Assessment: Allergies: Reviewed. She is allergic to simvastatin.  Allergy Precautions: None required Coagulopathies: Reviewed. None identified.  Blood-thinner therapy: None at this time Active Infection(s): Reviewed. None identified. Wanda [name] is afebrile  Site Confirmation: Wanda [name] was asked to confirm the procedure and laterality before marking the site Procedure checklist: Completed Consent: Before the procedure and under the influence of no sedative(s), amnesic(s), or anxiolytics, the patient was informed of the treatment options, risks and possible complications. To fulfill our ethical and legal obligations, as recommended by the American Medical Association's Code of Ethics, I have informed the patient of my clinical impression; the nature and purpose of the treatment or procedure; the risks, benefits, and possible complications of the intervention; the alternatives, including doing nothing; the risk(s) and benefit(s) of the alternative treatment(s) or procedure(s); and the risk(s) and benefit(s) of doing nothing. The patient was provided information about the general risks and possible complications associated with the procedure. These may include, but are not limited to: failure to achieve desired goals, infection, bleeding, organ or nerve damage, allergic reactions, paralysis, and death. In addition, the patient was informed of those risks and complications associated to the procedure, such as failure to decrease pain; infection; bleeding; organ or nerve damage with subsequent damage to sensory, motor, and/or autonomic systems, resulting in permanent pain, numbness, and/or weakness of one or several areas of the body; allergic reactions; (i.e.: anaphylactic reaction); and/or death. Furthermore, the patient was informed of those risks and complications associated with the medications. These include, but are not limited to: allergic reactions (i.e.: anaphylactic or  anaphylactoid reaction(s)); adrenal axis suppression; blood sugar elevation that in diabetics may result in ketoacidosis or comma; water retention that  in patients with history of congestive heart failure may result in shortness of breath, pulmonary edema, and decompensation with resultant heart failure; weight gain; swelling or edema; medication-induced neural toxicity; particulate matter embolism and blood vessel occlusion with resultant organ, and/or nervous system infarction; and/or aseptic necrosis of one or more joints. Finally, the patient was informed that Medicine is not an exact science; therefore, there is also the possibility of unforeseen or unpredictable risks and/or possible complications that may result in a catastrophic outcome. The patient indicated having understood very clearly. We have given the patient no guarantees and we have made no promises. Enough time was given to the patient to ask questions, all of which were answered to the patient's satisfaction. Wanda [name] has indicated that she wanted to continue with the procedure. Attestation: I, the ordering provider, attest that I have discussed with the patient the benefits, risks, side-effects, alternatives, likelihood of achieving goals, and potential problems during recovery for the procedure that I have provided informed consent. Date  Time: 02/13/2019  8:59 AM  Pre-Procedure Preparation:  Monitoring: As per clinic protocol. Respiration, ETCO2, SpO2, BP, heart rate and rhythm monitor placed and checked for adequate function Safety Precautions: Patient was assessed for positional comfort and pressure points before starting the procedure. Time-out: I initiated and conducted the "Time-out" before starting the procedure, as per protocol. The patient was asked to participate by confirming the accuracy of the "Time Out" information. Verification of the correct person, site, and procedure were performed and confirmed by me, the nursing  staff, and the patient. "Time-out" conducted as per Joint Commission's Universal Protocol (UP.01.01.01). Time: 0917  Description of Procedure:          Target Area: Knee Joint Approach: Just above the Lateral tibial plateau, lateral to the infrapatellar tendon. Area Prepped: Entire knee area, from the mid-thigh to the mid-shin. Prepping solution: DuraPrep (Iodine Povacrylex [0.7% available iodine] and Isopropyl Alcohol, 74% w/w) Safety Precautions: Aspiration looking for blood return was conducted prior to all injections. At no point did we inject any substances, as a needle was being advanced. No attempts were made at seeking any paresthesias. Safe injection practices and needle disposal techniques used. Medications properly checked for expiration dates. SDV (single dose vial) medications used. Description of the Procedure: Protocol guidelines were followed. The patient was placed in position over the fluoroscopy table. The target area was identified and the area prepped in the usual manner. Skin & deeper tissues infiltrated with local anesthetic. Appropriate amount of time allowed to pass for local anesthetics to take effect. The procedure needles were then advanced to the target area. Proper needle placement secured. Negative aspiration confirmed. Solution injected in intermittent fashion, asking for systemic symptoms every 0.5cc of injectate. The needles were then removed and the area cleansed, making sure to leave some of the prepping solution back to take advantage of its long term bactericidal properties. Vitals:   02/13/19 0857  BP: (!) 122/97  Pulse: 79  Resp: 18  Temp: 98.8 F (37.1 C)  TempSrc: Oral  SpO2: 99%  Weight: 242 lb (109.8 kg)  Height: 5\' 4"  (1.626 m)    Start Time: 0917 hrs. End Time: 0917 hrs. Materials:  Needle(s) Type: Regular needle Gauge: 25G Length: 1.5-in Medication(s): Please see orders for medications and dosing details.  Imaging Guidance:          Type  of Imaging Technique: None used Indication(s): N/A Exposure Time: No patient exposure Contrast: None used. Fluoroscopic Guidance: N/A Ultrasound Guidance:  N/A Interpretation: N/A  Antibiotic Prophylaxis:   Anti-infectives (From admission, onward)   None     Indication(s): None identified  Post-operative Assessment:  Post-procedure Vital Signs:  Pulse/HCG Rate: 79  Temp: 98.8 F (37.1 C) Resp: 18 BP: (!) 122/97 SpO2: 99 %  EBL: None  Complications: No immediate post-treatment complications observed by team, or reported by patient.  Note: The patient tolerated the entire procedure well. A repeat set of vitals were taken after the procedure and the patient was kept under observation following institutional policy, for this type of procedure. Post-procedural neurological assessment was performed, showing return to baseline, prior to discharge. The patient was provided with post-procedure discharge instructions, including a section on how to identify potential problems. Should any problems arise concerning this procedure, the patient was given instructions to immediately contact us, at any time, without hesitation. In any case, we plan to contact the patient by telephone for a follow-up status report regarding this interventional procedure.  Comments:  No additional relevant information.  Plan of Care  Orders:  Orders Placed This Encounter  Procedures  . KNEE INJECTION    Hyalgan knee injection to be done by MD.    Scheduling Instructions:     Procedure: Intra-articular Hyalgan Knee injection #1     Side(s): Left Knee     Sedation: None     Timeframe: Today    Order Specific Question:   Where will this procedure be performed?    Answer:   [facility]  . KNEE INJECTION    Hyalgan knee injection. Please order Hyalgan.    Standing Status:   Future    Standing Expiration Date:   03/16/2019    Scheduling Instructions:     Procedure: Intra-articular Hyalgan Knee  injection #2     Side: Left-sided     Sedation: None     Timeframe: in two (2) weeks    Order Specific Question:   Where will this procedure be performed?    Answer:   [facility]  . Provider attestation of informed consent for procedure/surgical case    I, the ordering provider, attest that I have discussed with the patient the benefits, risks, side effects, alternatives, likelihood of achieving goals and potential problems during recovery for the procedure that I have provided informed consent.    Standing Status:   Standing    Number of Occurrences:   1  . Informed Consent Details: Transcribe to consent form and obtain patient signature    Standing Status:   Standing    Number of Occurrences:   1    Order Specific Question:   Procedure    Answer:   Left intra-articular viscosupplementation knee injection with Hyalgan    Order Specific Question:   Surgeon    Answer:   Romelo Sciandra A. Dossie Arbour, MD    Order Specific Question:   Indication/Reason    Answer:   Left knee osteoarthritis and arthralgia   Chronic Opioid Analgesic:  Hydrocodone/APAP 5/325, 1 tab PO q 6 hrs (20 mg/day of hydrocodone) MME/day:20 mg/day.   Medications ordered for procedure: Meds ordered this encounter  Medications  . lidocaine (XYLOCAINE) 2 % (with pres) injection 400 mg  . lidocaine (PF) (XYLOCAINE) 1 % injection 5 mL  . ropivacaine (PF) 2 mg/mL (0.2%) (NAROPIN) injection 5 mL  . Sodium Hyaluronate SOSY 2 mL   Medications administered: We administered lidocaine (PF), ropivacaine (PF) 2 mg/mL (0.2%), and Sodium Hyaluronate.  See the medical record for exact  dosing, route, and time of administration.  Follow-up plan:   Return in about 2 weeks (around 02/27/2019) for Procedure (no sedation): (L) Hyalgan #2.       Considering:  NOTE: NO RFA until BMI <35. Diagnostic/palliative left intra-articular knee injection.  Diagnostic bilateral lumbar facet block #3 Diagnostic/palliative left L4-5 LESI     Palliative PRN treatment(s):  Palliative bilateral lumbar facet block #3  Palliative left intra-articular Hyalgan knee injection #S2/N2 (last completed 01/15/2018) Palliative bilateral intra-articular knee injection #3 (w/ steroid) (last done 08/15/18)      Recent Visits Date Type Provider Dept  02/03/19 Office Visit Milinda Pointer, MD [facility]  Showing recent visits within past 90 days and meeting all other requirements   Today's Visits Date Type Provider Dept  02/13/19 Procedure visit Milinda Pointer, MD [facility]  Showing today's visits and meeting all other requirements   Future Appointments Date Type Provider Dept  03/11/19 Appointment Milinda Pointer, MD [facility]  04/28/19 Appointment Milinda Pointer, MD [facility]  Showing future appointments within next 90 days and meeting all other requirements   Disposition: Discharge home  Discharge Date & Time: 02/13/2019; 0920 hrs.   Primary Care Physician: Colon Branch, MD Location: [facility] Note by: Gaspar Cola, MD Date: 02/13/2019; Time: 9:37 AM  Disclaimer:  Medicine is not an Chief Strategy Officer. The only guarantee in medicine is that nothing is guaranteed. It is important to note that the decision to proceed with this intervention was based on the information collected from the patient. The Data and conclusions were drawn from the patient's questionnaire, the interview, and the physical examination. Because the information was provided in large part by the patient, it cannot be guaranteed that it has not been purposely or unconsciously manipulated. Every effort has been made to obtain as much relevant data as possible for this evaluation. It is important to note that the conclusions that lead to this procedure are derived in large part from the available data. Always take into account that the treatment will also be dependent on  availability of resources and existing treatment guidelines, considered by other Pain Management Practitioners as being common knowledge and practice, at the time of the intervention. For Medico-Legal purposes, it is also important to point out that variation in procedural techniques and pharmacological choices are the acceptable norm. The indications, contraindications, technique, and results of the above procedure should only be interpreted and judged by a Board-Certified Interventional Pain Specialist with extensive familiarity and expertise in the same exact procedure and technique.

## 2019-02-14 ENCOUNTER — Telehealth: Payer: Self-pay | Admitting: *Deleted

## 2019-02-14 NOTE — Telephone Encounter (Signed)
Voicemail left with patient to please call our office if there are questions or concerns re; knee injection on yesterday.

## 2019-02-17 LAB — COMP. METABOLIC PANEL (12)
AST: 20 IU/L (ref 0–40)
Albumin/Globulin Ratio: 1.9 (ref 1.2–2.2)
Albumin: 4.5 g/dL (ref 3.8–4.8)
Alkaline Phosphatase: 87 IU/L (ref 39–117)
BUN/Creatinine Ratio: 19 (ref 12–28)
BUN: 13 mg/dL (ref 8–27)
Bilirubin Total: 0.3 mg/dL (ref 0.0–1.2)
Calcium: 9.7 mg/dL (ref 8.7–10.3)
Chloride: 100 mmol/L (ref 96–106)
Creatinine, Ser: 0.67 mg/dL (ref 0.57–1.00)
GFR calc Af Amer: 103 mL/min/{1.73_m2} (ref >59)
GFR calc non Af Amer: 89 mL/min/{1.73_m2} (ref >59)
Globulin, Total: 2.4 g/dL (ref 1.5–4.5)
Glucose: 99 mg/dL (ref 65–99)
Potassium: 4.2 mmol/L (ref 3.5–5.2)
Sodium: 139 mmol/L (ref 134–144)
Total Protein: 6.9 g/dL (ref 6.0–8.5)

## 2019-02-17 LAB — 25-HYDROXY VITAMIN D LCMS D2+D3
25-Hydroxy, Vitamin D-2: 3.4 ng/mL
25-Hydroxy, Vitamin D-3: 19 ng/mL
25-Hydroxy, Vitamin D: 22 ng/mL — ABNORMAL LOW

## 2019-02-17 LAB — SEDIMENTATION RATE: Sed Rate: 47 mm/hr — ABNORMAL HIGH (ref 0–40)

## 2019-02-17 LAB — C-REACTIVE PROTEIN: CRP: 8 mg/L (ref 0–10)

## 2019-02-17 LAB — VITAMIN B12: Vitamin B-12: 355 pg/mL (ref 232–1245)

## 2019-02-17 LAB — MAGNESIUM: Magnesium: 1.8 mg/dL (ref 1.6–2.3)

## 2019-02-17 LAB — TOXASSURE SELECT 13 (MW), URINE

## 2019-03-11 ENCOUNTER — Other Ambulatory Visit: Payer: Self-pay

## 2019-03-11 ENCOUNTER — Ambulatory Visit: Payer: Medicare HMO | Attending: Pain Medicine | Admitting: Pain Medicine

## 2019-03-11 ENCOUNTER — Encounter: Payer: Self-pay | Admitting: Pain Medicine

## 2019-03-11 VITALS — BP 129/78 | HR 68 | Temp 98.2°F | Resp 16 | Ht 64.0 in | Wt 250.0 lb

## 2019-03-11 DIAGNOSIS — M25562 Pain in left knee: Secondary | ICD-10-CM | POA: Diagnosis not present

## 2019-03-11 DIAGNOSIS — G8929 Other chronic pain: Secondary | ICD-10-CM | POA: Insufficient documentation

## 2019-03-11 DIAGNOSIS — M170 Bilateral primary osteoarthritis of knee: Secondary | ICD-10-CM | POA: Diagnosis not present

## 2019-03-11 DIAGNOSIS — M25561 Pain in right knee: Secondary | ICD-10-CM | POA: Insufficient documentation

## 2019-03-11 MED ORDER — SODIUM HYALURONATE (VISCOSUP) 20 MG/2ML IX SOSY
2.0000 mL | PREFILLED_SYRINGE | Freq: Once | INTRA_ARTICULAR | Status: AC
  Administered 2019-03-11: 09:00:00 via INTRA_ARTICULAR

## 2019-03-11 MED ORDER — ROPIVACAINE HCL 2 MG/ML IJ SOLN
5.0000 mL | Freq: Once | INTRAMUSCULAR | Status: AC
  Administered 2019-03-11: 09:00:00 5 mL via INTRA_ARTICULAR

## 2019-03-11 MED ORDER — ROPIVACAINE HCL 2 MG/ML IJ SOLN
INTRAMUSCULAR | Status: AC
  Filled 2019-03-11: qty 10

## 2019-03-11 MED ORDER — LIDOCAINE HCL (PF) 1 % IJ SOLN
5.0000 mL | Freq: Once | INTRAMUSCULAR | Status: AC
  Administered 2019-03-11: 09:00:00 5 mL

## 2019-03-11 MED ORDER — LIDOCAINE HCL (PF) 1 % IJ SOLN
INTRAMUSCULAR | Status: AC
  Filled 2019-03-11: qty 5

## 2019-03-11 NOTE — Progress Notes (Signed)
Safety precautions to be maintained throughout the outpatient stay will include: orient to surroundings, keep bed in low position, maintain call bell within reach at all times, provide assistance with transfer out of bed and ambulation.  

## 2019-03-11 NOTE — Patient Instructions (Addendum)
____________________________________________________________________________________________  Post-Procedure Discharge Instructions  Instructions:  Apply ice:   Purpose: This will minimize any swelling and discomfort after procedure.   When: Day of procedure, as soon as you get home.  How: Fill a plastic sandwich bag with crushed ice. Cover it with a small towel and apply to injection site.  How long: (15 min on, 15 min off) Apply for 15 minutes then remove x 15 minutes.  Repeat sequence on day of procedure, until you go to bed.  Apply heat:   Purpose: To treat any soreness and discomfort from the procedure.  When: Starting the next day after the procedure.  How: Apply heat to procedure site starting the day following the procedure.  How long: May continue to repeat daily, until discomfort goes away.  Food intake: Start with clear liquids (like water) and advance to regular food, as tolerated.   Physical activities: Keep activities to a minimum for the first 8 hours after the procedure. After that, then as tolerated.  Driving: If you have received any sedation, be responsible and do not drive. You are not allowed to drive for 24 hours after having sedation.  Blood thinner: (Applies only to those taking blood thinners) You may restart your blood thinner 6 hours after your procedure.  Insulin: (Applies only to Diabetic patients taking insulin) As soon as you can eat, you may resume your normal dosing schedule.  Infection prevention: Keep procedure site clean and dry. Shower daily and clean area with soap and water.  Post-procedure Pain Diary: Extremely important that this be done correctly and accurately. Recorded information will be used to determine the next step in treatment. For the purpose of accuracy, follow these rules:  Evaluate only the area treated. Do not report or include pain from an untreated area. For the purpose of this evaluation, ignore all other areas of pain,  except for the treated area.  After your procedure, avoid taking a long nap and attempting to complete the pain diary after you wake up. Instead, set your alarm clock to go off every hour, on the hour, for the initial 8 hours after the procedure. Document the duration of the numbing medicine, and the relief you are getting from it.  Do not go to sleep and attempt to complete it later. It will not be accurate. If you received sedation, it is likely that you were given a medication that may cause amnesia. Because of this, completing the diary at a later time may cause the information to be inaccurate. This information is needed to plan your care.  Follow-up appointment: Keep your post-procedure follow-up evaluation appointment after the procedure (usually 2 weeks for most procedures, 6 weeks for radiofrequencies). DO NOT FORGET to bring you pain diary with you.   Expect: (What should I expect to see with my procedure?)  From numbing medicine (AKA: Local Anesthetics): Numbness or decrease in pain. You may also experience some weakness, which if present, could last for the duration of the local anesthetic.  Onset: Full effect within 15 minutes of injected.  Duration: It will depend on the type of local anesthetic used. On the average, 1 to 8 hours.   From steroids (Applies only if steroids were used): Decrease in swelling or inflammation. Once inflammation is improved, relief of the pain will follow.  Onset of benefits: Depends on the amount of swelling present. The more swelling, the longer it will take for the benefits to be seen. In some cases, up to 10 days.    Duration: Steroids will stay in the system x 2 weeks. Duration of benefits will depend on multiple posibilities including persistent irritating factors.  Side-effects: If present, they may typically last 2 weeks (the duration of the steroids).  Frequent: Cramps (if they occur, drink Gatorade and take over-the-counter Magnesium 450-500 mg  once to twice a day); water retention with temporary weight gain; increases in blood sugar; decreased immune system response; increased appetite.  Occasional: Facial flushing (red, warm cheeks); mood swings; menstrual changes.  Uncommon: Long-term decrease or suppression of natural hormones; bone thinning. (These are more common with higher doses or more frequent use. This is why we prefer that our patients avoid having any injection therapies in other practices.)   Very Rare: Severe mood changes; psychosis; aseptic necrosis.  From procedure: Some discomfort is to be expected once the numbing medicine wears off. This should be minimal if ice and heat are applied as instructed.  Call if: (When should I call?)  You experience numbness and weakness that gets worse with time, as opposed to wearing off.  New onset bowel or bladder incontinence. (Applies only to procedures done in the spine)  Emergency Numbers:  Durning business hours (Monday - Thursday, 8:00 AM - 4:00 PM) (Friday, 9:00 AM - 12:00 Noon): [phone]  After hours: [phone]  NOTE: If you are having a problem and are unable connect with, or to talk to a provider, then go to your nearest urgent care or emergency department. If the problem is serious and urgent, please call 911. ____________________________________________________________________________________________   ____________________________________________________________________________________________  Preparing for your procedure (without sedation)  Procedure appointments are limited to planned procedures: . No Prescription Refills. . No disability issues will be discussed. . No medication changes will be discussed.  Instructions: . Oral Intake: Do not eat or drink anything for at least 3 hours prior to your procedure. . Transportation: Unless otherwise stated by your physician, you may drive yourself after the procedure. . Blood Pressure Medicine:  Take your blood pressure medicine with a sip of water the morning of the procedure. . Blood thinners: Notify our staff if you are taking any blood thinners. Depending on which one you take, there will be specific instructions on how and when to stop it. . Diabetics on insulin: Notify the staff so that you can be scheduled 1st case in the morning. If your diabetes requires high dose insulin, take only  of your normal insulin dose the morning of the procedure and notify the staff that you have done so. . Preventing infections: Shower with an antibacterial soap the morning of your procedure.  . Build-up your immune system: Take 1000 mg of Vitamin C with every meal (3 times a day) the day prior to your procedure. . Antibiotics: Inform the staff if you have a condition or reason that requires you to take antibiotics before dental procedures. . Pregnancy: If you are pregnant, call and cancel the procedure. . Sickness: If you have a cold, fever, or any active infections, call and cancel the procedure. . Arrival: You must be in the facility at least 30 minutes prior to your scheduled procedure. . Children: Do not bring any children with you. . Dress appropriately: Bring dark clothing that you would not mind if they get stained. . Valuables: Do not bring any jewelry or valuables.  Reasons to call and reschedule or cancel your procedure: (Following these recommendations will minimize the risk of a serious complication.) . Surgeries: Avoid having procedures within 2 weeks   of any surgery. (Avoid for 2 weeks before or after any surgery). . Flu Shots: Avoid having procedures within 2 weeks of a flu shots or . (Avoid for 2 weeks before or after immunizations). . Barium: Avoid having a procedure within 7-10 days after having had a radiological study involving the use of radiological contrast. (Myelograms, Barium swallow or enema study). . Heart attacks: Avoid any elective procedures or surgeries for the initial 6  months after a "Myocardial Infarction" (Heart Attack). . Blood thinners: It is imperative that you stop these medications before procedures. Let us know if you if you take any blood thinner.  . Infection: Avoid procedures during or within two weeks of an infection (including chest colds or gastrointestinal problems). Symptoms associated with infections include: Localized redness, fever, chills, night sweats or profuse sweating, burning sensation when voiding, cough, congestion, stuffiness, runny nose, sore throat, diarrhea, nausea, vomiting, cold or Flu symptoms, recent or current infections. It is specially important if the infection is over the area that we intend to treat. [name] Heart and lung problems: Symptoms that may suggest an active cardiopulmonary problem include: cough, chest pain, breathing difficulties or shortness of breath, dizziness, ankle swelling, uncontrolled high or unusually low blood pressure, and/or palpitations. If you are experiencing any of these symptoms, cancel your procedure and contact your primary care physician for an evaluation.  Remember:  Regular Business hours are:  Monday to Thursday 8:00 AM to 4:00 PM  Provider's Schedule: Milinda Pointer, MD:  Procedure days: Tuesday and Thursday 7:30 AM to 4:00 PM  Gillis Santa, MD:  Procedure days: Monday and Wednesday 7:30 AM to 4:00 PM ____________________________________________________________________________________________   Post-procedure Information What to expect: Most procedures involve the use of a local anesthetic (numbing medicine), and a steroid (anti-inflammatory medicine).  The local anesthetics may cause temporary numbness and weakness of the legs or arms, depending on the location of the block. This numbness/weakness may last 4-6 hours, depending on the local anesthetic used. In rare instances, it can last up to 24 hours. While numb, you must be very careful not to injure the extremity.  After any  procedure, you could expect the pain to get better within 15-20 minutes. This relief is temporary and may last 4-6 hours. Once the local anesthetics wears off, you could experience discomfort, possibly more than usual, for up to 10 (ten) days. In the case of radiofrequencies, it may last up to 6 weeks. Surgeries may take up to 8 weeks for the healing process. The discomfort is due to the irritation caused by needles going through skin and muscle. To minimize the discomfort, we recommend using ice the first day, and heat from then on. The ice should be applied for 15 minutes on, and 15 minutes off. Keep repeating this cycle until bedtime. Avoid applying the ice directly to the skin, to prevent frostbite. Heat should be used daily, until the pain improves (4-10 days). Be careful not to burn yourself.  Occasionally you may experience muscle spasms or cramps. These occur as a consequence of the irritation caused by the needle sticks to the muscle and the blood that will inevitably be lost into the surrounding muscle tissue. Blood tends to be very irritating to tissues, which tend to react by going into spasm. These spasms may start the same day of your procedure, but they may also take days to develop. This late onset type of spasm or cramp is usually caused by electrolyte imbalances triggered by the steroids, at the level  of the kidney. Cramps and spasms tend to respond well to muscle relaxants, multivitamins (some are triggered by the procedure, but may have their origins in vitamin deficiencies), and "Gatorade", or any sports drinks that can replenish any electrolyte imbalances. (If you are a diabetic, ask your pharmacist to get you a sugar-free brand.) Warm showers or baths may also be helpful. Stretching exercises are highly recommended. General Instructions:  Be alert for signs of possible infection: redness, swelling, heat, red streaks, elevated temperature, and/or fever. These typically appear 4 to 6 days  after the procedure. Immediately notify your doctor if you experience unusual bleeding, difficulty breathing, or loss of bowel or bladder control. If you experience increased pain, do not increase your pain medicine intake, unless instructed by your pain physician. Post-Procedure Care:  Be careful in moving about. Muscle spasms in the area of the injection may occur. Applying ice or heat to the area is often helpful. The incidence of spinal headaches after epidural injections ranges between 1.4% and 6%. If you develop a headache that does not seem to respond to conservative therapy, please let your physician know. This can be treated with an epidural blood patch.   Post-procedure numbness or redness is to be expected, however it should average 4 to 6 hours. If numbness and weakness of your extremities begins to develop 4 to 6 hours after your procedure, and is felt to be progressing and worsening, immediately contact your physician.   Diet:  If you experience nausea, do not eat until this sensation goes away. If you had a "Stellate Ganglion Block" for upper extremity "Reflex Sympathetic Dystrophy", do not eat or drink until your hoarseness goes away. In any case, always start with liquids first and if you tolerate them well, then slowly progress to more solid foods. Activity:  For the first 4 to 6 hours after the procedure, use caution in moving about as you may experience numbness and/or weakness. Use caution in cooking, using household electrical appliances, and climbing steps. If you need to reach your Doctor call our office: [phone] Monday-Thursday 8:00 am - 4:00 PM    Fridays: Closed     In case of an emergency: In case of emergency, call 911 or go to the nearest emergency room and have the physician there call us.  Interpretation of Procedure Every nerve block has two components: a diagnostic component, and a treatment component. Unrealistic expectations are the most common causes of  "perceived failure".  In a perfect world, a single nerve block should be able to completely and permanently eliminate the pain. Sadly, the world is not perfect.  Most pain management nerve blocks are performed using local anesthetics and steroids. Steroids are responsible for any long-term benefit that you may experience. Their purpose is to decrease any chronic swelling that may exist in the area. Steroids begin to work immediately after being injected. However, most patients will not experience any benefits until 5 to 10 days after the injection, when the swelling has come down to the point where they can tell a difference. Steroids will only help if there is swelling to be treated. As such, they can assist with the diagnosis. If effective, they suggest an inflammatory component to the pain, and if ineffective, they rule out inflammation as the main cause or component of the problem. If the problem is one of mechanical compression, you will get no benefit from those steroids.   In the case of local anesthetics, they have a crucial role  in the diagnosis of your condition. Most will begin to work within15 to 20 minutes after injection. The duration will depend on the type used (short- vs. Long-acting). It is of outmost importance that patients keep tract of their pain, after the procedure. To assist with this matter, a "Post-procedure Pain Diary" is provided. Make sure to complete it and to bring it back to your follow-up appointment.  As long as the patient keeps accurate, detailed records of their symptoms after every procedure, and returns to have those interpreted, every procedure will provide [place] with invaluable information. Even a block that does not provide the patient with any relief, will always provide [place] with information about the mechanism and the origin of the pain. The only time a nerve block can be considered a waste of time is when patients do not keep track of the results, or do not keep their  post-procedure appointment.  Reporting the results back to your physician The Pain Score  Pain is a subjective complaint. It cannot be seen, touched, or measured. We depend entirely on the patient's report of the pain in order to assess your condition and treatment. To evaluate the pain, we use a pain scale, where "0" means "No Pain", and a "10" is "the worst possible pain that you can even imagine" (i.e. something like been eaten alive by a shark or being torn apart by a lion).   You will frequently be asked to rate your pain. Please be as accurate, remember that medical decisions will be based on your responses. Please do not rate your pain above a 10. Doing so is actually interpreted as "symptom magnification" (exaggeration), as well as lack of understanding with regards to the scale. To put this into perspective, when you tell us that your pain is at a 10 (ten), what you are saying is that there is nothing we can do to make this pain any worse. (Carefully think about that.)

## 2019-03-11 NOTE — Progress Notes (Signed)
Patient's Name: Wanda [name]  MRN: [id number]  Referring Provider: Colon Branch, MD  DOB: 09/29/1945  PCP: Colon Branch, MD  DOS: 03/11/2019  Note by: Gaspar Cola, MD  Service setting: Ambulatory outpatient  Specialty: Interventional Pain Management  Patient type: Established  Location: [facility] (AMB) Pain Management Facility  Visit type: Interventional Procedure   Primary Reason for Visit: Interventional Pain Management Treatment. CC: Knee Pain (left)  Procedure:          Anesthesia, Analgesia, Anxiolysis:  Type: Therapeutic Intra-Articular Hyalgan Knee Injection S2N2  Region: Lateral infrapatellar Knee Region Level: Knee Joint Laterality: Left knee  Type: Local Anesthesia Indication(s): Analgesia         Local Anesthetic: Lidocaine 1-2% Route: Infiltration ([place]/IM) IV Access: Declined Sedation: Declined   Position: Sitting   Indications: 1. Osteoarthritis of knee (Bilateral)   2. Chronic knee pain (Bilateral) (L>R)    Pain Score: Pre-procedure: 2 /10 Post-procedure: 0-No pain/10   Pre-op Assessment:  Wanda [name] is a 73 y.o. (year old), female patient, seen today for interventional treatment. She  has a past surgical history that includes G3 P3 . [name] has a current medication list which includes the following prescription(s): aspirin ec, atorvastatin, vitamin d, cyclobenzaprine, gabapentin, hydrochlorothiazide, hydrocodone-acetaminophen, hydrocodone-acetaminophen, hydrocodone-acetaminophen, lisinopril, and sertraline. Her primarily concern today is the Knee Pain (left)  Initial Vital Signs:  Pulse/HCG Rate: 81  Temp: 98.2 F (36.8 C) Resp: 18 BP: (!) 129/91 SpO2: 99 %  BMI: Estimated body mass index is 42.91 kg/m as calculated from the following:   Height as of this encounter: 5\' 4"  (1.626 m).   Weight as of this encounter: 250 lb (113.4 kg).  Risk Assessment: Allergies: Reviewed. She is allergic to simvastatin.  Allergy Precautions: None  required Coagulopathies: Reviewed. None identified.  Blood-thinner therapy: None at this time Active Infection(s): Reviewed. None identified. Wanda [name] is afebrile  Site Confirmation: Wanda [name] was asked to confirm the procedure and laterality before marking the site Procedure checklist: Completed Consent: Before the procedure and under the influence of no sedative(s), amnesic(s), or anxiolytics, the patient was informed of the treatment options, risks and possible complications. To fulfill our ethical and legal obligations, as recommended by the American Medical Association's Code of Ethics, I have informed the patient of my clinical impression; the nature and purpose of the treatment or procedure; the risks, benefits, and possible complications of the intervention; the alternatives, including doing nothing; the risk(s) and benefit(s) of the alternative treatment(s) or procedure(s); and the risk(s) and benefit(s) of doing nothing. The patient was provided information about the general risks and possible complications associated with the procedure. These may include, but are not limited to: failure to achieve desired goals, infection, bleeding, organ or nerve damage, allergic reactions, paralysis, and death. In addition, the patient was informed of those risks and complications associated to the procedure, such as failure to decrease pain; infection; bleeding; organ or nerve damage with subsequent damage to sensory, motor, and/or autonomic systems, resulting in permanent pain, numbness, and/or weakness of one or several areas of the body; allergic reactions; (i.e.: anaphylactic reaction); and/or death. Furthermore, the patient was informed of those risks and complications associated with the medications. These include, but are not limited to: allergic reactions (i.e.: anaphylactic or anaphylactoid reaction(s)); adrenal axis suppression; blood sugar elevation that in diabetics may result in ketoacidosis  or comma; water retention that in patients with history of congestive heart failure may result in shortness of breath, pulmonary edema,  and decompensation with resultant heart failure; weight gain; swelling or edema; medication-induced neural toxicity; particulate matter embolism and blood vessel occlusion with resultant organ, and/or nervous system infarction; and/or aseptic necrosis of one or more joints. Finally, the patient was informed that Medicine is not an exact science; therefore, there is also the possibility of unforeseen or unpredictable risks and/or possible complications that may result in a catastrophic outcome. The patient indicated having understood very clearly. We have given the patient no guarantees and we have made no promises. Enough time was given to the patient to ask questions, all of which were answered to the patient's satisfaction. Wanda [name] has indicated that she wanted to continue with the procedure. Attestation: I, the ordering provider, attest that I have discussed with the patient the benefits, risks, side-effects, alternatives, likelihood of achieving goals, and potential problems during recovery for the procedure that I have provided informed consent. Date  Time: 03/11/2019  8:44 AM  Pre-Procedure Preparation:  Monitoring: As per clinic protocol. Respiration, ETCO2, SpO2, BP, heart rate and rhythm monitor placed and checked for adequate function Safety Precautions: Patient was assessed for positional comfort and pressure points before starting the procedure. Time-out: I initiated and conducted the "Time-out" before starting the procedure, as per protocol. The patient was asked to participate by confirming the accuracy of the "Time Out" information. Verification of the correct person, site, and procedure were performed and confirmed by me, the nursing staff, and the patient. "Time-out" conducted as per Joint Commission's Universal Protocol (UP.01.01.01). Time:  0907  Description of Procedure:          Target Area: Knee Joint Approach: Just above the Lateral tibial plateau, lateral to the infrapatellar tendon. Area Prepped: Entire knee area, from the mid-thigh to the mid-shin. Prepping solution: DuraPrep (Iodine Povacrylex [0.7% available iodine] and Isopropyl Alcohol, 74% w/w) Safety Precautions: Aspiration looking for blood return was conducted prior to all injections. At no point did we inject any substances, as a needle was being advanced. No attempts were made at seeking any paresthesias. Safe injection practices and needle disposal techniques used. Medications properly checked for expiration dates. SDV (single dose vial) medications used. Description of the Procedure: Protocol guidelines were followed. The patient was placed in position over the fluoroscopy table. The target area was identified and the area prepped in the usual manner. Skin & deeper tissues infiltrated with local anesthetic. Appropriate amount of time allowed to pass for local anesthetics to take effect. The procedure needles were then advanced to the target area. Proper needle placement secured. Negative aspiration confirmed. Solution injected in intermittent fashion, asking for systemic symptoms every 0.5cc of injectate. The needles were then removed and the area cleansed, making sure to leave some of the prepping solution back to take advantage of its long term bactericidal properties. Vitals:   03/11/19 0844 03/11/19 0909  BP: (!) 129/91 129/78  Pulse: 81 68  Resp: 18 16  Temp: 98.2 F (36.8 C)   SpO2: 99% 99%  Weight: 250 lb (113.4 kg)   Height: 5\' 4"  (1.626 m)     Start Time: 0907 hrs. End Time:   hrs. Materials:  Needle(s) Type: Regular needle Gauge: 25G Length: 1.5-in Medication(s): Please see orders for medications and dosing details.  Imaging Guidance:          Type of Imaging Technique: None used Indication(s): N/A Exposure Time: No patient exposure Contrast:  None used. Fluoroscopic Guidance: N/A Ultrasound Guidance: N/A Interpretation: N/A  Antibiotic Prophylaxis:   Anti-infectives (  From admission, onward)   None     Indication(s): None identified  Post-operative Assessment:  Post-procedure Vital Signs:  Pulse/HCG Rate: 68  Temp: 98.2 F (36.8 C) Resp: 16 BP: 129/78 SpO2: 99 %  EBL: None  Complications: No immediate post-treatment complications observed by team, or reported by patient.  Note: The patient tolerated the entire procedure well. A repeat set of vitals were taken after the procedure and the patient was kept under observation following institutional policy, for this type of procedure. Post-procedural neurological assessment was performed, showing return to baseline, prior to discharge. The patient was provided with post-procedure discharge instructions, including a section on how to identify potential problems. Should any problems arise concerning this procedure, the patient was given instructions to immediately contact us, at any time, without hesitation. In any case, we plan to contact the patient by telephone for a follow-up status report regarding this interventional procedure.  Comments:  No additional relevant information.  Plan of Care  Orders:  Orders Placed This Encounter  Procedures  . KNEE INJECTION    Hyalgan knee injection to be done by MD.    Scheduling Instructions:     Procedure: Intra-articular Hyalgan Knee injection #2     Side(s): Left Knee     Sedation: None     Timeframe: Today    Order Specific Question:   Where will this procedure be performed?    Answer:   [facility]  . KNEE INJECTION    Hyalgan knee injection. Please order Hyalgan.    Standing Status:   Future    Standing Expiration Date:   04/11/2019    Scheduling Instructions:     Procedure: Intra-articular Hyalgan Knee injection #3     Side: Left-sided     Sedation: None     Timeframe: in two (2) weeks    Order Specific  Question:   Where will this procedure be performed?    Answer:   [facility]  . Provider attestation of informed consent for procedure/surgical case    I, the ordering provider, attest that I have discussed with the patient the benefits, risks, side effects, alternatives, likelihood of achieving goals and potential problems during recovery for the procedure that I have provided informed consent.    Standing Status:   Standing    Number of Occurrences:   1  . Informed Consent Details: Transcribe to consent form and obtain patient signature    Standing Status:   Standing    Number of Occurrences:   1    Order Specific Question:   Procedure    Answer:   Left intra-articular viscosupplementation knee injection with Hyalgan    Order Specific Question:   Surgeon    Answer:   Kalisi Bevill A. Dossie Arbour, MD    Order Specific Question:   Indication/Reason    Answer:   Left knee osteoarthritis and arthralgia   Chronic Opioid Analgesic:  Hydrocodone/APAP 5/325, 1 tab PO q 6 hrs (20 mg/day of hydrocodone) MME/day:20 mg/day.   Medications ordered for procedure: Meds ordered this encounter  Medications  . lidocaine (PF) (XYLOCAINE) 1 % injection 5 mL  . ropivacaine (PF) 2 mg/mL (0.2%) (NAROPIN) injection 5 mL  . Sodium Hyaluronate SOSY 2 mL   Medications administered: We administered lidocaine (PF), ropivacaine (PF) 2 mg/mL (0.2%), and Sodium Hyaluronate.  See the medical record for exact dosing, route, and time of administration.  Follow-up plan:   Return in about 2 weeks (around 03/25/2019) for Procedure (no  sedation): (L) Hyalgan #3.       Considering:  NOTE: NO RFA until BMI <35. Diagnostic/palliative left L4-5 LESI    Palliative PRN treatment(s):  Palliative bilateral lumbar facet block #3  Palliative left IA Hyalgan knee injection #S2/N3 (last series completed 01/15/2018) Palliative left vs bilateral IA knee injection #3 (w/ steroid) (last done 08/15/18)       Recent Visits Date  Type Provider Dept  02/13/19 Procedure visit Milinda Pointer, MD [facility]  02/03/19 Office Visit Milinda Pointer, MD [facility]  Showing recent visits within past 90 days and meeting all other requirements   Today's Visits Date Type Provider Dept  03/11/19 Procedure visit Milinda Pointer, MD [facility]  Showing today's visits and meeting all other requirements   Future Appointments Date Type Provider Dept  04/08/19 Appointment Milinda Pointer, MD [facility]  04/28/19 Appointment Milinda Pointer, MD [facility]  Showing future appointments within next 90 days and meeting all other requirements   Disposition: Discharge home  Discharge Date & Time: 03/11/2019; 0910 hrs.   Primary Care Physician: Colon Branch, MD Location: [facility] Note by: Gaspar Cola, MD Date: 03/11/2019; Time: 10:51 AM  Disclaimer:  Medicine is not an Chief Strategy Officer. The only guarantee in medicine is that nothing is guaranteed. It is important to note that the decision to proceed with this intervention was based on the information collected from the patient. The Data and conclusions were drawn from the patient's questionnaire, the interview, and the physical examination. Because the information was provided in large part by the patient, it cannot be guaranteed that it has not been purposely or unconsciously manipulated. Every effort has been made to obtain as much relevant data as possible for this evaluation. It is important to note that the conclusions that lead to this procedure are derived in large part from the available data. Always take into account that the treatment will also be dependent on availability of resources and existing treatment guidelines, considered by other Pain Management Practitioners as being common knowledge and practice, at the time of the intervention. For Medico-Legal purposes, it is also  important to point out that variation in procedural techniques and pharmacological choices are the acceptable norm. The indications, contraindications, technique, and results of the above procedure should only be interpreted and judged by a Board-Certified Interventional Pain Specialist with extensive familiarity and expertise in the same exact procedure and technique.

## 2019-03-12 ENCOUNTER — Telehealth: Payer: Self-pay | Admitting: *Deleted

## 2019-03-12 NOTE — Telephone Encounter (Signed)
No answer. LVM for her to return call for any post procedure issues.

## 2019-03-21 ENCOUNTER — Other Ambulatory Visit: Payer: Self-pay | Admitting: Pain Medicine

## 2019-03-21 DIAGNOSIS — E559 Vitamin D deficiency, unspecified: Secondary | ICD-10-CM

## 2019-03-21 DIAGNOSIS — R700 Elevated erythrocyte sedimentation rate: Secondary | ICD-10-CM

## 2019-03-21 MED ORDER — VITAMIN D3 125 MCG (5000 UT) PO CAPS: 1.0000 | ORAL_CAPSULE | Freq: Every day | ORAL | 5 refills | Status: DC

## 2019-03-21 MED ORDER — CALCIUM CARBONATE 600 MG PO TABS: 600.0000 mg | ORAL_TABLET | Freq: Two times a day (BID) | ORAL | 5 refills | Status: DC

## 2019-03-21 MED ORDER — ERGOCALCIFEROL 1.25 MG (50000 UT) PO CAPS: 50000.0000 [IU] | ORAL_CAPSULE | ORAL | 0 refills | Status: AC

## 2019-03-26 ENCOUNTER — Ambulatory Visit (INDEPENDENT_AMBULATORY_CARE_PROVIDER_SITE_OTHER): Payer: Medicare HMO | Admitting: Internal Medicine

## 2019-03-26 ENCOUNTER — Encounter: Payer: Self-pay | Admitting: Internal Medicine

## 2019-03-26 ENCOUNTER — Other Ambulatory Visit: Payer: Self-pay

## 2019-03-26 VITALS — BP 127/73 | HR 70 | Temp 95.3°F | Ht 65.0 in | Wt 242.1 lb

## 2019-03-26 DIAGNOSIS — Z0000 Encounter for general adult medical examination without abnormal findings: Secondary | ICD-10-CM | POA: Diagnosis not present

## 2019-03-26 DIAGNOSIS — E785 Hyperlipidemia, unspecified: Secondary | ICD-10-CM | POA: Diagnosis not present

## 2019-03-26 DIAGNOSIS — Z23 Encounter for immunization: Secondary | ICD-10-CM | POA: Diagnosis not present

## 2019-03-26 DIAGNOSIS — F418 Other specified anxiety disorders: Secondary | ICD-10-CM

## 2019-03-26 LAB — TSH: TSH: 1.69 u[IU]/mL (ref 0.35–4.50)

## 2019-03-26 LAB — LIPID PANEL
Cholesterol: 206 mg/dL — ABNORMAL HIGH (ref 0–200)
HDL: 54.8 mg/dL (ref >39.00)
LDL Cholesterol: 135 mg/dL — ABNORMAL HIGH (ref 0–99)
NonHDL: 151.56
Total CHOL/HDL Ratio: 4
Triglycerides: 85 mg/dL (ref 0.0–149.0)
VLDL: 17 mg/dL (ref 0.0–40.0)

## 2019-03-26 NOTE — Progress Notes (Signed)
Pre visit review using our clinic review tool, if applicable. No additional management support is needed unless otherwise documented below in the visit note. 

## 2019-03-26 NOTE — Patient Instructions (Addendum)
Please schedule Medicare Wellness with Glenard Haring.   GO TO THE LAB : Get the blood work     GO TO THE FRONT DESK Schedule your next appointment   for a physical exam in 1 year  Please consider seeing one of the lab our counselors, they do virtual appointments.

## 2019-03-26 NOTE — Progress Notes (Signed)
Subjective:    Patient ID: Wanda [name], female    DOB: [birth year], 73 y.o.   MRN: [id number]  DOS:  03/26/2019 Type of visit - description: CPX Here for CPX Physically is doing about the same, knee arthritis still very bothersome. Mentally she is very stressed, her son was diagnosed with cancer, her husband has dementia  Review of Systems Not sleeping well.  Other than above, a 14 point review of systems is negative    Past Medical History:  Diagnosis Date  . Allergy    RHINITIS  . Chronic back pain   . Depression   . DJD (degenerative joint disease)    back pain , on disability  . Hyperlipidemia   . Hypertension   . Menopause    onset age 38   . Musculoskeletal pain 12/07/2015  . TMJ PAIN 05/17/2010   Qualifier: Diagnosis of  By: Larose Kells MD, [place]     Past Surgical History:  Procedure Laterality Date  . G3 P3       Social History   Socioeconomic History  . Marital status: Married    Spouse name: Not on file  . Number of children: 3  . Years of education: Not on file  . Highest education level: Not on file  Occupational History  . Occupation: ON DISABILITY  Social Needs  . Financial resource strain: Not on file  . Food insecurity    Worry: Not on file    Inability: Not on file  . Transportation needs    Medical: Not on file    Non-medical: Not on file  Tobacco Use  . Smoking status: Never Smoker  . Smokeless tobacco: Never Used  Substance and Sexual Activity  . Alcohol use: No  . Drug use: No  . Sexual activity: Not Currently  Lifestyle  . Physical activity    Days per week: Not on file    Minutes per session: Not on file  . Stress: Not on file  Relationships  . Social Herbalist on phone: Not on file    Gets together: Not on file    Attends religious service: Not on file    Active member of club or organization: Not on file    Attends meetings of clubs or organizations: Not on file    Relationship status: Not on file  .  Intimate partner violence    Fear of current or ex partner: Not on file    Emotionally abused: Not on file    Physically abused: Not on file    Forced sexual activity: Not on file  Other Topics Concern  . Not on file  Social History Narrative   Married, 3 kids, 6 G-kids    Household-- pt, husband, son     Family History  Problem Relation Age of Onset  . Cancer Sister        ??CERVICAL  . Cancer Brother        STOMACH  . Breast cancer Sister        sister dx age 61, aunt, cousin x 2   . Colon cancer Brother        dx age ~ 41 ?  [name] Diabetes Other        aunt   . Kidney cancer Son   . Coronary artery disease Neg Hx   . CAD Neg Hx      Allergies as of 03/26/2019      Reactions   Simvastatin  REACTION: cramps      Medication List       Accurate as of March 26, 2019 11:59 PM. If you have any questions, ask your nurse or doctor.        aspirin EC 81 MG tablet Take 81 mg by mouth daily.   atorvastatin 10 MG tablet Commonly known as: LIPITOR Take 1 tablet (10 mg total) by mouth at bedtime.   calcium carbonate 600 MG Tabs tablet Commonly known as: Calcium 600 Take 1 tablet (600 mg total) by mouth 2 (two) times daily with a meal.   cyclobenzaprine 10 MG tablet Commonly known as: FLEXERIL Take 1 tablet (10 mg total) by mouth at bedtime.   ergocalciferol 1.25 MG (50000 UT) capsule Commonly known as: VITAMIN D2 Take 1 capsule (50,000 Units total) by mouth 2 (two) times a week. X 6 weeks.   gabapentin 300 MG capsule Commonly known as: NEURONTIN Take 1 capsule (300 mg total) by mouth 3 (three) times daily.   hydrochlorothiazide 25 MG tablet Commonly known as: HYDRODIURIL Take 1 tablet (25 mg total) by mouth daily.   HYDROcodone-acetaminophen 5-325 MG tablet Commonly known as: NORCO/VICODIN Take 1 tablet by mouth every 6 (six) hours as needed for severe pain. Must last 30 days What changed: Another medication with the same name was removed. Continue taking  this medication, and follow the directions you see here. Changed by: Kathlene November, MD   HYDROcodone-acetaminophen 5-325 MG tablet Commonly known as: NORCO/VICODIN Take 1 tablet by mouth every 6 (six) hours as needed for severe pain. Must last 30 days Start taking on: April 11, 2019 What changed: Another medication with the same name was removed. Continue taking this medication, and follow the directions you see here. Changed by: Kathlene November, MD   lisinopril 10 MG tablet Commonly known as: ZESTRIL Take 1 tablet (10 mg total) by mouth daily.   sertraline 50 MG tablet Commonly known as: ZOLOFT Take 3 tablets (150 mg total) by mouth daily.   Vitamin D3 125 MCG (5000 UT) Caps Take 1 capsule (5,000 Units total) by mouth daily with breakfast. Take along with calcium and magnesium.           Objective:   Physical Exam BP 127/73 (BP Location: Left Arm, Patient Position: Sitting, Cuff Size: Small)   Pulse 70   Temp (!) 95.3 F (35.2 C) (Temporal)   Ht 5\' 5"  (1.651 m)   Wt 242 lb 2 oz (109.8 kg)   SpO2 100%   BMI 40.29 kg/m  General: Well developed, NAD, BMI noted Neck: No  thyromegaly  HEENT:  Normocephalic . Face symmetric, atraumatic Lungs:  CTA B Normal respiratory effort, no intercostal retractions, no accessory muscle use. Heart: RRR,  no murmur.  No pretibial edema bilaterally  Abdomen:  Not distended, soft, non-tender. No rebound or rigidity.   Skin: Exposed areas without rash. Not pale. Not jaundice Neurologic:  alert & oriented X3.  Speech normal, gait limited by BMI and knee arthritis Strength symmetric and appropriate for age.  Psych: Cognition and judgment appear intact.  Cooperative with normal attention span and concentration.  Behavior appropriate. No anxious or depressed appearing.     Assessment    Assessment HTN Hyperlipidemia Depression Morbid obesity MSK: DJD, chronic back-knee pain, hydrocodone /gabapentin rx by DR Shelly Rubenstein D  Def   PLAN: Here for CPX HTN: On HCTZ, lisinopril, last BMP satisfactory.  BP today is very good, BP is frequently checked at the pain management clinic.  Hyperlipidemia: On Lipitor, check FLP Depression: On Zoloft, recently her 67 year old son was diagnosed with kidney cancer, stress has definitely increased.  Despite that, she does not like to increase her medication or take sleeping aids. She has a lot on her, her husband has dementia. Warned  about caregiver fatigue.  Counseled.  Information about counselors provided. Vitamin D deficiency: Recently prescribed ergocalciferol by pain management DJD, chronic pain: Managed elsewhere RTC 1 year

## 2019-03-26 NOTE — Assessment & Plan Note (Addendum)
-   Td 2019 -PNM 23 2016 -PNM 13 2015 - zostavax: 2014 -Shingrix: discussed, declined for now  -Flu shot today -Female care: last PAP 2017, last visit w/ Dr Deatra Ina ; he retire, will refer to new gyn next year [facility] 07/2018 per [facility] -CCS: cscope 2014, due for arepeat cscope, not ready at this point to proceed (son w/ cancer) -DEXA  2012: Normal (and  improved compared to 2009 per report).  Referral for a DEXA last year to failed, not ready to proceed . -Labs: Reviewed, will check FLP and TSH -Diet and exercise discussed although she is currently quite limited due to knee pain

## 2019-03-27 NOTE — Assessment & Plan Note (Signed)
Here for CPX HTN: On HCTZ, lisinopril, last BMP satisfactory.  BP today is very good, BP is frequently checked at the pain management clinic. Hyperlipidemia: On Lipitor, check FLP Depression: On Zoloft, recently her 71 year old son was diagnosed with kidney cancer, stress has definitely increased.  Despite that, she does not like to increase her medication or take sleeping aids. She has a lot on her, her husband has dementia. Warned  about caregiver fatigue.  Counseled.  Information about counselors provided. Vitamin D deficiency: Recently prescribed ergocalciferol by pain management DJD, chronic pain: Managed elsewhere RTC 1 year

## 2019-03-28 MED ORDER — ATORVASTATIN CALCIUM 40 MG PO TABS: 40.0000 mg | ORAL_TABLET | Freq: Every day | ORAL | 3 refills | Status: DC

## 2019-03-28 NOTE — Addendum Note (Signed)
Addended byDamita Dunnings D on: 03/28/2019 04:44 PM   Modules accepted: Orders

## 2019-04-08 ENCOUNTER — Ambulatory Visit: Payer: Medicare HMO | Admitting: Pain Medicine

## 2019-04-08 NOTE — Progress Notes (Deleted)
Patient's Name: Wanda [name]  MRN: [id number]  Referring Provider: Colon Branch, MD  DOB: 06-21-1946  PCP: Colon Branch, MD  DOS: 04/08/2019  Note by: Gaspar Cola, MD  Service setting: Ambulatory outpatient  Specialty: Interventional Pain Management  Patient type: Established  Location: [facility] (AMB) Pain Management Facility  Visit type: Interventional Procedure   Primary Reason for Visit: Interventional Pain Management Treatment. CC: No chief complaint on file.  Procedure:          Anesthesia, Analgesia, Anxiolysis:  Type: Therapeutic Intra-Articular Hyalgan Knee Injection S2N3  Region: Lateral infrapatellar Knee Region Level: Knee Joint Laterality: Left knee  Type: Local Anesthesia Indication(s): Analgesia         Local Anesthetic: Lidocaine 1-2% Route: Infiltration (/IM) IV Access: Declined Sedation: Declined   Position: Sitting   Indications: 1. Chronic knee pain (Bilateral) (L>R)   2. Osteoarthritis of knee (Bilateral)    Pain Score: Pre-procedure:  /10 Post-procedure:  /10   Pre-op Assessment:  [name] is a 73 y.o. (year old), female patient, seen today for interventional treatment. She  has a past surgical history that includes G3 P3 . [name] has a current medication list which includes the following prescription(s): aspirin ec, atorvastatin, calcium carbonate, vitamin d3, cyclobenzaprine, ergocalciferol, gabapentin, hydrochlorothiazide, hydrocodone-acetaminophen, hydrocodone-acetaminophen, lisinopril, and sertraline. Her primarily concern today is the No chief complaint on file.  Initial Vital Signs:  Pulse/HCG Rate:    Temp:   Resp:   BP:   SpO2:    BMI: Estimated body mass index is 40.29 kg/m as calculated from the following:   Height as of 03/26/19: 5\' 5"  (1.651 m).   Weight as of 03/26/19: 242 lb 2 oz (109.8 kg).  Risk Assessment: Allergies: Reviewed. She is allergic to simvastatin.  Allergy Precautions: None required Coagulopathies: Reviewed.  None identified.  Blood-thinner therapy: None at this time Active Infection(s): Reviewed. None identified. [name] is afebrile  Site Confirmation: Ms. Keylon was asked to confirm the procedure and laterality before marking the site Procedure checklist: Completed Consent: Before the procedure and under the influence of no sedative(s), amnesic(s), or anxiolytics, the patient was informed of the treatment options, risks and possible complications. To fulfill our ethical and legal obligations, as recommended by the American Medical Association's Code of Ethics, I have informed the patient of my clinical impression; the nature and purpose of the treatment or procedure; the risks, benefits, and possible complications of the intervention; the alternatives, including doing nothing; the risk(s) and benefit(s) of the alternative treatment(s) or procedure(s); and the risk(s) and benefit(s) of doing nothing. The patient was provided information about the general risks and possible complications associated with the procedure. These may include, but are not limited to: failure to achieve desired goals, infection, bleeding, organ or nerve damage, allergic reactions, paralysis, and death. In addition, the patient was informed of those risks and complications associated to the procedure, such as failure to decrease pain; infection; bleeding; organ or nerve damage with subsequent damage to sensory, motor, and/or autonomic systems, resulting in permanent pain, numbness, and/or weakness of one or several areas of the body; allergic reactions; (i.e.: anaphylactic reaction); and/or death. Furthermore, the patient was informed of those risks and complications associated with the medications. These include, but are not limited to: allergic reactions (i.e.: anaphylactic or anaphylactoid reaction(s)); adrenal axis suppression; blood sugar elevation that in diabetics may result in ketoacidosis or comma; water retention that in  patients with history of congestive heart failure may result  in shortness of breath, pulmonary edema, and decompensation with resultant heart failure; weight gain; swelling or edema; medication-induced neural toxicity; particulate matter embolism and blood vessel occlusion with resultant organ, and/or nervous system infarction; and/or aseptic necrosis of one or more joints. Finally, the patient was informed that Medicine is not an exact science; therefore, there is also the possibility of unforeseen or unpredictable risks and/or possible complications that may result in a catastrophic outcome. The patient indicated having understood very clearly. We have given the patient no guarantees and we have made no promises. Enough time was given to the patient to ask questions, all of which were answered to the patient's satisfaction. [name] has indicated that she wanted to continue with the procedure. Attestation: I, the ordering provider, attest that I have discussed with the patient the benefits, risks, side-effects, alternatives, likelihood of achieving goals, and potential problems during recovery for the procedure that I have provided informed consent. Date  Time: {CHL [facility]-PAIN TIME CHOICES:[id number]}  Pre-Procedure Preparation:  Monitoring: As per clinic protocol. Respiration, ETCO2, SpO2, BP, heart rate and rhythm monitor placed and checked for adequate function Safety Precautions: Patient was assessed for positional comfort and pressure points before starting the procedure. Time-out: I initiated and conducted the "Time-out" before starting the procedure, as per protocol. The patient was asked to participate by confirming the accuracy of the "Time Out" information. Verification of the correct person, site, and procedure were performed and confirmed by me, the nursing staff, and the patient. "Time-out" conducted as per Joint Commission's Universal Protocol (UP.01.01.01). Time:    Description of  Procedure:          Target Area: Knee Joint Approach: Just above the Lateral tibial plateau, lateral to the infrapatellar tendon. Area Prepped: Entire knee area, from the mid-thigh to the mid-shin. Prepping solution: DuraPrep (Iodine Povacrylex [0.7% available iodine] and Isopropyl Alcohol, 74% w/w) Safety Precautions: Aspiration looking for blood return was conducted prior to all injections. At no point did we inject any substances, as a needle was being advanced. No attempts were made at seeking any paresthesias. Safe injection practices and needle disposal techniques used. Medications properly checked for expiration dates. SDV (single dose vial) medications used. Description of the Procedure: Protocol guidelines were followed. The patient was placed in position over the fluoroscopy table. The target area was identified and the area prepped in the usual manner. Skin & deeper tissues infiltrated with local anesthetic. Appropriate amount of time allowed to pass for local anesthetics to take effect. The procedure needles were then advanced to the target area. Proper needle placement secured. Negative aspiration confirmed. Solution injected in intermittent fashion, asking for systemic symptoms every 0.5cc of injectate. The needles were then removed and the area cleansed, making sure to leave some of the prepping solution back to take advantage of its long term bactericidal properties. There were no vitals filed for this visit.  Start Time:   hrs. End Time:   hrs. Materials:  Needle(s) Type: Regular needle Gauge: 25G Length: 1.5-in Medication(s): Please see orders for medications and dosing details.  Imaging Guidance:          Type of Imaging Technique: None used Indication(s): N/A Exposure Time: No patient exposure Contrast: None used. Fluoroscopic Guidance: N/A Ultrasound Guidance: N/A Interpretation: N/A  Antibiotic Prophylaxis:   Anti-infectives (From admission, onward)   None      Indication(s): None identified  Post-operative Assessment:  Post-procedure Vital Signs:  Pulse/HCG Rate:    Temp:   Resp:  BP:   SpO2:    EBL: None  Complications: No immediate post-treatment complications observed by team, or reported by patient.  Note: The patient tolerated the entire procedure well. A repeat set of vitals were taken after the procedure and the patient was kept under observation following institutional policy, for this type of procedure. Post-procedural neurological assessment was performed, showing return to baseline, prior to discharge. The patient was provided with post-procedure discharge instructions, including a section on how to identify potential problems. Should any problems arise concerning this procedure, the patient was given instructions to immediately contact us, at any time, without hesitation. In any case, we plan to contact the patient by telephone for a follow-up status report regarding this interventional procedure.  Comments:  No additional relevant information.  Plan of Care  Orders:  No orders of the defined types were placed in this encounter.  Chronic Opioid Analgesic:  Hydrocodone/APAP 5/325, 1 tab PO q 6 hrs (20 mg/day of hydrocodone) MME/day:20 mg/day.   Medications ordered for procedure: No orders of the defined types were placed in this encounter.  Medications administered: Jinelle L. Neal had no medications administered during this visit.  See the medical record for exact dosing, route, and time of administration.  Follow-up plan:   No follow-ups on file.       Considering:  NOTE: NO RFA until BMI <35. Diagnostic/palliative left L4-5 LESI    Palliative PRN treatment(s):  Palliative bilateral lumbar facet block #3  Palliative left IA Hyalgan knee injection #S2/N4 (last series completed 01/15/2018) Palliative left vs bilateral IA knee injection #3 (w/ steroid) (last done 08/15/18)     Recent Visits Date Type Provider  Dept  03/11/19 Procedure visit Milinda Pointer, MD [facility]  02/13/19 Procedure visit Milinda Pointer, MD [facility]  02/03/19 Office Visit Milinda Pointer, MD [facility]  Showing recent visits within past 90 days and meeting all other requirements   Today's Visits Date Type Provider Dept  04/08/19 Appointment Milinda Pointer, MD [facility]  Showing today's visits and meeting all other requirements   Future Appointments Date Type Provider Dept  04/28/19 Appointment Milinda Pointer, MD [facility]  Showing future appointments within next 90 days and meeting all other requirements   Disposition: Discharge home  Discharge Date & Time: 04/08/2019;   hrs.   Primary Care Physician: Colon Branch, MD Location: [facility] Note by: Gaspar Cola, MD Date: 04/08/2019; Time: 6:29 AM  Disclaimer:  Medicine is not an Chief Strategy Officer. The only guarantee in medicine is that nothing is guaranteed. It is important to note that the decision to proceed with this intervention was based on the information collected from the patient. The Data and conclusions were drawn from the patient's questionnaire, the interview, and the physical examination. Because the information was provided in large part by the patient, it cannot be guaranteed that it has not been purposely or unconsciously manipulated. Every effort has been made to obtain as much relevant data as possible for this evaluation. It is important to note that the conclusions that lead to this procedure are derived in large part from the available data. Always take into account that the treatment will also be dependent on availability of resources and existing treatment guidelines, considered by other Pain Management Practitioners as being common knowledge and practice, at the time of the intervention. For Medico-Legal purposes, it is also important to point  out that variation in procedural techniques  and pharmacological choices are the acceptable norm. The indications, contraindications, technique, and results of the above procedure should only be interpreted and judged by a Board-Certified Interventional Pain Specialist with extensive familiarity and expertise in the same exact procedure and technique.

## 2019-04-25 ENCOUNTER — Telehealth: Payer: Self-pay

## 2019-04-25 NOTE — Telephone Encounter (Signed)
Left message for patient to call us to go over virtual appointment questions.

## 2019-04-28 ENCOUNTER — Ambulatory Visit: Payer: Medicare HMO | Attending: Pain Medicine | Admitting: Pain Medicine

## 2019-04-28 ENCOUNTER — Telehealth: Payer: Self-pay | Admitting: *Deleted

## 2019-04-28 ENCOUNTER — Encounter: Payer: Self-pay | Admitting: Pain Medicine

## 2019-04-28 ENCOUNTER — Other Ambulatory Visit: Payer: Self-pay

## 2019-04-28 DIAGNOSIS — M5410 Radiculopathy, site unspecified: Secondary | ICD-10-CM | POA: Diagnosis not present

## 2019-04-28 DIAGNOSIS — G8929 Other chronic pain: Secondary | ICD-10-CM

## 2019-04-28 DIAGNOSIS — M5416 Radiculopathy, lumbar region: Secondary | ICD-10-CM | POA: Diagnosis not present

## 2019-04-28 DIAGNOSIS — M48061 Spinal stenosis, lumbar region without neurogenic claudication: Secondary | ICD-10-CM

## 2019-04-28 DIAGNOSIS — M5442 Lumbago with sciatica, left side: Secondary | ICD-10-CM | POA: Diagnosis not present

## 2019-04-28 DIAGNOSIS — M5126 Other intervertebral disc displacement, lumbar region: Secondary | ICD-10-CM | POA: Diagnosis not present

## 2019-04-28 DIAGNOSIS — M170 Bilateral primary osteoarthritis of knee: Secondary | ICD-10-CM | POA: Diagnosis not present

## 2019-04-28 DIAGNOSIS — M25562 Pain in left knee: Secondary | ICD-10-CM

## 2019-04-28 DIAGNOSIS — G894 Chronic pain syndrome: Secondary | ICD-10-CM

## 2019-04-28 DIAGNOSIS — M4310 Spondylolisthesis, site unspecified: Secondary | ICD-10-CM | POA: Diagnosis not present

## 2019-04-28 DIAGNOSIS — M792 Neuralgia and neuritis, unspecified: Secondary | ICD-10-CM

## 2019-04-28 DIAGNOSIS — M5136 Other intervertebral disc degeneration, lumbar region: Secondary | ICD-10-CM

## 2019-04-28 DIAGNOSIS — M25561 Pain in right knee: Secondary | ICD-10-CM | POA: Diagnosis not present

## 2019-04-28 DIAGNOSIS — M797 Fibromyalgia: Secondary | ICD-10-CM

## 2019-04-28 DIAGNOSIS — M7918 Myalgia, other site: Secondary | ICD-10-CM

## 2019-04-28 MED ORDER — HYDROCODONE-ACETAMINOPHEN 5-325 MG PO TABS: 1.0000 | ORAL_TABLET | Freq: Four times a day (QID) | ORAL | 0 refills | Status: DC | PRN

## 2019-04-28 MED ORDER — GABAPENTIN 300 MG PO CAPS: 300.0000 mg | ORAL_CAPSULE | Freq: Three times a day (TID) | ORAL | 2 refills | Status: DC

## 2019-04-28 MED ORDER — CYCLOBENZAPRINE HCL 10 MG PO TABS: 10.0000 mg | ORAL_TABLET | Freq: Every day | ORAL | 2 refills | Status: DC

## 2019-04-28 NOTE — Progress Notes (Signed)
Pain Management Virtual Encounter Note - Virtual Visit via Telephone Telehealth (real-time audio visits between healthcare provider and patient).   Patient's Phone No. & Preferred Pharmacy:  [phone] (home); [phone] (mobile); (Preferred) [phone] No e-mail address on record  [facility] [address] [place] [address] Phone: [phone] Fax: [phone]    Pre-screening note:  Our staff contacted Wanda [name] and offered her an "in person", "face-to-face" appointment versus a telephone encounter. She indicated preferring the telephone encounter, at this time.   Reason for Virtual Visit: COVID-19*  Social distancing based on CDC and AMA recommendations.   I contacted Wanda [name] on 04/28/2019 via telephone.      I clearly identified myself as Gaspar Cola, MD. I verified that I was speaking with the correct person using two identifiers (Name: Wanda [name], and date of birth: Apr 04, 1946).  Advanced Informed Consent I sought verbal advanced consent from Wanda [name] for virtual visit interactions. I informed Wanda [name] of possible security and privacy concerns, risks, and limitations associated with providing "not-in-person" medical evaluation and management services. I also informed Wanda [name] of the availability of "in-person" appointments. Finally, I informed her that there would be a charge for the virtual visit and that she could be  personally, fully or partially, financially responsible for it. Wanda [name] expressed understanding and agreed to proceed.   Historic Elements   Wanda [name] is a 73 y.o. year old, female patient evaluated today after her last encounter by our practice on 04/25/2019. Wanda [name]  has a past medical history of Allergy, Chronic back pain, Depression, DJD (degenerative joint disease), Hyperlipidemia, Hypertension,  Menopause, Musculoskeletal pain (12/07/2015), and TMJ PAIN (05/17/2010). She also  has a past surgical history that includes G3 P3 . Wanda [name] has a current medication list which includes the following prescription(s): aspirin ec, atorvastatin, calcium carbonate, vitamin d3, ergocalciferol, hydrochlorothiazide, lisinopril, sertraline, cyclobenzaprine, gabapentin, hydrocodone-acetaminophen, hydrocodone-acetaminophen, and hydrocodone-acetaminophen. She  reports that she has never smoked. She has never used smokeless tobacco. She reports that she does not drink alcohol or use drugs. Wanda [name] is allergic to simvastatin.   HPI  Today, she is being contacted for both, medication management and a post-procedure assessment.  The patient indicates doing well with the current medication regimen. No adverse reactions or side effects reported to the medications.  She did get some benefit from the Hyalgan knee injection, but clearly she needs to complete the series of 5 since the pain is beginning to come back.  We will make arrangements for her to complete the series of 5 over the next several weeks.  Post-Procedure Evaluation  Procedure: Therapeutic left-sided intra-articular Hyalgan knee injection S2N2  Pre-procedure pain level:  2/10 Post-procedure: 0/10 (100% relief)  Sedation: None.  Effectiveness during initial hour after procedure(Ultra-Short Term Relief): 100 %   Local anesthetic used: Long-acting (4-6 hours) Effectiveness: Defined as any analgesic benefit obtained secondary to the administration of local anesthetics. This carries significant diagnostic value as to the etiological location, or anatomical origin, of the pain. Duration of benefit is expected to coincide with the duration of the local anesthetic used.  Effectiveness during initial 4-6 hours after procedure(Short-Term Relief): 100 %   Long-term benefit: Defined as any relief past the pharmacologic duration of the local anesthetics.   Effectiveness past the initial 6 hours after procedure(Long-Term Relief): 50 %   Current benefits: Defined as benefit  that persist at this time.   Analgesia:  50% improved Function: Somewhat improved ROM: Somewhat improved  Pharmacotherapy Assessment  Analgesic: Hydrocodone/APAP 5/325, 1 tab PO q 6 hrs (20 mg/day of hydrocodone) MME/day:20 mg/day.   Monitoring: Pharmacotherapy: No side-effects or adverse reactions reported. [place] PMP: PDMP reviewed during this encounter.       Compliance: No problems identified. Effectiveness: Clinically acceptable. Plan: Refer to "POC".  UDS:  Summary  Date Value Ref Range Status  02/13/2019 Note  Final    Comment:    ==================================================================== [employer] Select 13 (MW) ==================================================================== Test                             Result       Flag       Units Drug Present and Declared for Prescription Verification   Hydrocodone                    645          EXPECTED   ng/mg creat   Hydromorphone                  68           EXPECTED   ng/mg creat   Dihydrocodeine                 54           EXPECTED   ng/mg creat   Norhydrocodone                 905          EXPECTED   ng/mg creat    Sources of hydrocodone include scheduled prescription medications.    Hydromorphone, dihydrocodeine and norhydrocodone are expected    metabolites of hydrocodone. Hydromorphone and dihydrocodeine are    also available as scheduled prescription medications. ==================================================================== Test                      Result    Flag   Units      Ref Range   Creatinine              265              mg/dL      >=20 ==================================================================== Declared Medications:  The flagging and interpretation on this report are based on the  following declared medications.  Unexpected results may arise from   inaccuracies in the declared medications.  **Note: The testing scope of this panel includes these medications:  Hydrocodone  **Note: The testing scope of this panel does not include the  following reported medications:  Acetaminophen  Aspirin  Atorvastatin (Lipitor)  Cholecalciferol  Cyclobenzaprine  Gabapentin  Hydrochlorothiazide  Lisinopril  Sertraline ==================================================================== For clinical consultation, please call [phone]. ====================================================================    Laboratory Chemistry Profile (12 mo)  Renal: 02/13/2019: BUN 13; BUN/Creatinine Ratio 19; Creatinine, Ser 0.67  Lab Results  Component Value Date   GFR 105.13 12/24/2018   GFRAA 103 02/13/2019   GFRNONAA 89 02/13/2019   Hepatic: 02/13/2019: Albumin 4.5 Lab Results  Component Value Date   AST 20 02/13/2019   ALT 18 12/24/2017   Other: 02/13/2019: 25-Hydroxy, Vitamin D 22; 25-Hydroxy, Vitamin D-2 3.4; 25-Hydroxy, Vitamin D-3 19; CRP 8; Sed Rate 47; Vitamin B-12 355 Note: Above Lab results reviewed.  Imaging  Last 90 days:  No results found.  Assessment  The primary encounter  diagnosis was Chronic pain syndrome. 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  Plan of Care  I am having Lexxi L. [name] start on HYDROcodone-acetaminophen and HYDROcodone-acetaminophen. I am also having her maintain her aspirin EC,  hydrochlorothiazide, sertraline, lisinopril, ergocalciferol, Vitamin D3, calcium carbonate, atorvastatin, cyclobenzaprine, gabapentin, and HYDROcodone-acetaminophen.  Pharmacotherapy (Medications Ordered): Meds ordered this encounter  Medications  . cyclobenzaprine (FLEXERIL) 10 MG tablet    Sig: Take 1 tablet (10 mg total) by mouth at bedtime.    Dispense:  30 tablet    Refill:  2    Fill one day early if pharmacy is closed on scheduled refill date. May substitute for generic if available.  . gabapentin (NEURONTIN) 300 MG capsule    Sig: Take 1 capsule (300 mg total) by mouth 3 (three) times daily.    Dispense:  90 capsule    Refill:  2    Fill one day early if pharmacy is closed on scheduled refill date. May substitute for generic if available.  [name] HYDROcodone-acetaminophen (NORCO/VICODIN) 5-325 MG tablet    Sig: Take 1 tablet by mouth every 6 (six) hours as needed for severe pain. Must last 30 days    Dispense:  120 tablet    Refill:  0    Chronic Pain: STOP Act (Not applicable) Fill 1 day early if closed on refill date. Do not fill until: 05/11/2019. To last until: 06/10/2019. Avoid benzodiazepines within 8 hours of opioids  . HYDROcodone-acetaminophen (NORCO/VICODIN) 5-325 MG tablet    Sig: Take 1 tablet by mouth every 6 (six) hours as needed for severe pain. Must last 30 days    Dispense:  120 tablet    Refill:  0    Chronic Pain: STOP Act (Not applicable) Fill 1 day early if closed on refill date. Do not fill until: 06/10/2019. To last until: 07/10/2019. Avoid benzodiazepines within 8 hours of opioids  . HYDROcodone-acetaminophen (NORCO/VICODIN) 5-325 MG tablet    Sig: Take 1 tablet by mouth every 6 (six) hours as needed for severe pain. Must last 30 days    Dispense:  120 tablet    Refill:  0    Chronic Pain: STOP Act (Not applicable) Fill 1 day early if closed on refill date. Do not fill until: 07/10/2019. To last until: 08/09/2019. Avoid benzodiazepines within 8 hours of opioids    Orders:  Orders Placed This Encounter  Procedures  . KNEE INJECTION    Hyalgan knee injection. Please order Hyalgan.    Standing Status:   Future    Standing Expiration Date:   05/29/2019    Scheduling Instructions:     Procedure: Intra-articular Hyalgan Knee injection #3     Side: Left-sided     Sedation: None     Timeframe: ASAP    Order Specific Question:   Where will this procedure be performed?    Answer:   [facility]   Follow-up plan:   Return in about 3 months (around 08/06/2019) for (VV), (MM), in addition, Procedure (no sedation): (L) Hyalgan #S2N3, (ASAP).  Considering:  NOTE: NO RFA until BMI <35. Diagnostic/palliative left L4-5 LESI  Diagnostic left L3 TFESI    Palliative PRN treatment(s):  Palliative bilateral lumbar facet block #3  Palliative left IA Hyalgan knee injection #S2/N3 (last series completed 01/15/2018) Palliative left vs bilateral IA knee injection #3 (w/ steroid) (last done 08/15/18)     Recent Visits Date Type Provider Dept  03/11/19 Procedure visit Milinda Pointer, MD [facility]  02/13/19 Procedure visit Milinda Pointer, MD [facility]  02/03/19 Office Visit Milinda Pointer, MD [facility]  Showing recent visits within past 90 days and meeting all other requirements   Today's Visits Date Type Provider Dept  04/28/19 Office Visit Milinda Pointer, MD [facility]  Showing today's visits and meeting all other requirements   Future Appointments No visits were found meeting these conditions.  Showing future appointments within next 90 days and meeting all other requirements   I discussed the assessment and treatment plan with the patient. The patient was provided an opportunity to ask questions and all were answered. The patient agreed with the plan and demonstrated an understanding of the instructions.  Patient advised to call back or seek an in-person evaluation if the symptoms  or condition worsens.  Total duration of non-face-to-face encounter: 14 minutes.  Note by: Gaspar Cola, MD Date: 04/28/2019; Time: 9:56 AM  Note: This dictation was prepared with Dragon dictation. Any transcriptional errors that may result from this process are unintentional.  Disclaimer:  * Given the special circumstances of the COVID-19 pandemic, the federal government has announced that the Office for Civil Rights (OCR) will exercise its enforcement discretion and will not impose penalties on physicians using telehealth in the event of noncompliance with regulatory requirements under the [place] (HIPAA) in connection with the good faith provision of telehealth during the XX123456 national public health emergency. ([place])

## 2019-04-28 NOTE — Patient Instructions (Signed)
____________________________________________________________________________________________  Preparing for your procedure (without sedation)  Procedure appointments are limited to planned procedures: . No Prescription Refills. . No disability issues will be discussed. . No medication changes will be discussed.  Instructions: . Oral Intake: Do not eat or drink anything for at least 3 hours prior to your procedure. . Transportation: Unless otherwise stated by your physician, you may drive yourself after the procedure. . Blood Pressure Medicine: Take your blood pressure medicine with a sip of water the morning of the procedure. . Blood thinners: Notify our staff if you are taking any blood thinners. Depending on which one you take, there will be specific instructions on how and when to stop it. . Diabetics on insulin: Notify the staff so that you can be scheduled 1st case in the morning. If your diabetes requires high dose insulin, take only  of your normal insulin dose the morning of the procedure and notify the staff that you have done so. . Preventing infections: Shower with an antibacterial soap the morning of your procedure.  . Build-up your immune system: Take 1000 mg of Vitamin C with every meal (3 times a day) the day prior to your procedure. . Antibiotics: Inform the staff if you have a condition or reason that requires you to take antibiotics before dental procedures. . Pregnancy: If you are pregnant, call and cancel the procedure. . Sickness: If you have a cold, fever, or any active infections, call and cancel the procedure. . Arrival: You must be in the facility at least 30 minutes prior to your scheduled procedure. . Children: Do not bring any children with you. . Dress appropriately: Bring dark clothing that you would not mind if they get stained. . Valuables: Do not bring any jewelry or valuables.  Reasons to call and reschedule or cancel your procedure: (Following these  recommendations will minimize the risk of a serious complication.) . Surgeries: Avoid having procedures within 2 weeks of any surgery. (Avoid for 2 weeks before or after any surgery). . Flu Shots: Avoid having procedures within 2 weeks of a flu shots or . (Avoid for 2 weeks before or after immunizations). . Barium: Avoid having a procedure within 7-10 days after having had a radiological study involving the use of radiological contrast. (Myelograms, Barium swallow or enema study). . Heart attacks: Avoid any elective procedures or surgeries for the initial 6 months after a "Myocardial Infarction" (Heart Attack). . Blood thinners: It is imperative that you stop these medications before procedures. Let us know if you if you take any blood thinner.  . Infection: Avoid procedures during or within two weeks of an infection (including chest colds or gastrointestinal problems). Symptoms associated with infections include: Localized redness, fever, chills, night sweats or profuse sweating, burning sensation when voiding, cough, congestion, stuffiness, runny nose, sore throat, diarrhea, nausea, vomiting, cold or Flu symptoms, recent or current infections. It is specially important if the infection is over the area that we intend to treat. . Heart and lung problems: Symptoms that may suggest an active cardiopulmonary problem include: cough, chest pain, breathing difficulties or shortness of breath, dizziness, ankle swelling, uncontrolled high or unusually low blood pressure, and/or palpitations. If you are experiencing any of these symptoms, cancel your procedure and contact your primary care physician for an evaluation.  Remember:  Regular Business hours are:  Monday to Thursday 8:00 AM to 4:00 PM  Provider's Schedule: Norina Cowper, MD:  Procedure days: Tuesday and Thursday 7:30 AM to 4:00 PM  Bilal   Lateef, MD:  Procedure days: Monday and Wednesday 7:30 AM to 4:00  PM ____________________________________________________________________________________________    

## 2019-04-30 ENCOUNTER — Telehealth: Payer: Self-pay

## 2019-04-30 NOTE — Telephone Encounter (Signed)
Copied from [place] [phone]. Topic: General - Inquiry >> Apr 30, 2019 11:10 AM Wanda [name] wrote: Reason for CRM: Pt called and is requesting to have PCP recommend her some otc medication to help her with stuffy nose/ allergies. Please advise.

## 2019-04-30 NOTE — Telephone Encounter (Signed)
Please advise 

## 2019-04-30 NOTE — Telephone Encounter (Signed)
Spoke w/ Pt- informed of PCP recommendations. Pt verbalized understanding.  

## 2019-04-30 NOTE — Telephone Encounter (Signed)
Recommend OTC Flonase 2 sprays on each side of the nose daily. She could also take OTC Claritin 10 mg 1 tablet daily prn. Avoid decongestants such as pseudoephedrine

## 2019-05-01 ENCOUNTER — Ambulatory Visit: Payer: Medicare HMO | Admitting: Pain Medicine

## 2019-05-06 ENCOUNTER — Ambulatory Visit: Payer: Medicare HMO | Attending: Pain Medicine | Admitting: Pain Medicine

## 2019-05-06 ENCOUNTER — Encounter: Payer: Self-pay | Admitting: Pain Medicine

## 2019-05-06 ENCOUNTER — Other Ambulatory Visit: Payer: Self-pay

## 2019-05-06 VITALS — BP 136/94 | HR 84 | Temp 98.5°F | Ht 60.0 in | Wt 245.0 lb

## 2019-05-06 DIAGNOSIS — G8929 Other chronic pain: Secondary | ICD-10-CM | POA: Diagnosis not present

## 2019-05-06 DIAGNOSIS — Z6841 Body Mass Index (BMI) 40.0 and over, adult: Secondary | ICD-10-CM | POA: Insufficient documentation

## 2019-05-06 DIAGNOSIS — M170 Bilateral primary osteoarthritis of knee: Secondary | ICD-10-CM

## 2019-05-06 DIAGNOSIS — M25562 Pain in left knee: Secondary | ICD-10-CM | POA: Diagnosis not present

## 2019-05-06 DIAGNOSIS — M25561 Pain in right knee: Secondary | ICD-10-CM | POA: Insufficient documentation

## 2019-05-06 MED ORDER — SODIUM HYALURONATE (VISCOSUP) 20 MG/2ML IX SOSY
2.0000 mL | PREFILLED_SYRINGE | Freq: Once | INTRA_ARTICULAR | Status: AC
  Administered 2019-05-06: 10:00:00 via INTRA_ARTICULAR

## 2019-05-06 MED ORDER — LIDOCAINE HCL (PF) 1 % IJ SOLN
INTRAMUSCULAR | Status: AC
  Filled 2019-05-06: qty 5

## 2019-05-06 MED ORDER — ROPIVACAINE HCL 2 MG/ML IJ SOLN
5.0000 mL | Freq: Once | INTRAMUSCULAR | Status: AC
  Administered 2019-05-06: 10 mL via INTRA_ARTICULAR

## 2019-05-06 MED ORDER — LIDOCAINE HCL (PF) 1 % IJ SOLN
5.0000 mL | Freq: Once | INTRAMUSCULAR | Status: AC
  Administered 2019-05-06: 5 mL

## 2019-05-06 MED ORDER — ROPIVACAINE HCL 2 MG/ML IJ SOLN
INTRAMUSCULAR | Status: AC
  Filled 2019-05-06: qty 10

## 2019-05-06 NOTE — Progress Notes (Signed)
Patient's Name: Wanda [name]  MRN: [id number]  Referring Provider: Colon Branch, MD  DOB: 1945/09/03  PCP: Colon Branch, MD  DOS: 05/06/2019  Note by: Gaspar Cola, MD  Service setting: Ambulatory outpatient  Specialty: Interventional Pain Management  Patient type: Established  Location: [facility] (AMB) Pain Management Facility  Visit type: Interventional Procedure   Primary Reason for Visit: Interventional Pain Management Treatment. CC: Knee Pain (left)  Procedure:          Anesthesia, Analgesia, Anxiolysis:  Type: Therapeutic Intra-Articular Hyalgan Knee Injection S2/N3  Region: Lateral infrapatellar Knee Region Level: Knee Joint Laterality: Left knee  Type: Local Anesthesia Indication(s): Analgesia         Local Anesthetic: Lidocaine 1-2% Route: Infiltration ([place]/IM) IV Access: Declined Sedation: Declined   Position: Sitting   Indications: 1. Osteoarthritis of knee (Bilateral)   2. Chronic knee pain (Primary Area of Pain) (Bilateral) (L>R)   3. Morbid obesity with body mass index (BMI) of 40.0 to 44.9 in adult [facility])    Pain Score: Pre-procedure: 6 /10 Post-procedure: 0-No pain/10   Pre-op Assessment:  [name] is a 73 y.o. (year old), female patient, seen today for interventional treatment. She  has a past surgical history that includes G3 P3 . [name] has a current medication list which includes the following prescription(s): aspirin ec, atorvastatin, calcium carbonate, vitamin d3, cyclobenzaprine, gabapentin, hydrochlorothiazide, hydrocodone-acetaminophen, hydrocodone-acetaminophen, hydrocodone-acetaminophen, lisinopril, and sertraline. Her primarily concern today is the Knee Pain (left)  Initial Vital Signs:  Pulse/HCG Rate: 84  Temp: 98.5 F (36.9 C) Resp:   BP: (!) 136/94 SpO2: 99 %  BMI: Estimated body mass index is 47.85 kg/m as calculated from the following:   Height as of this encounter: 5' (1.524 m).   Weight as of this encounter: 245 lb (111.1  kg).  Risk Assessment: Allergies: Reviewed. She is allergic to simvastatin.  Allergy Precautions: None required Coagulopathies: Reviewed. None identified.  Blood-thinner therapy: None at this time Active Infection(s): Reviewed. None identified. [name] is afebrile  Site Confirmation: [name] was asked to confirm the procedure and laterality before marking the site Procedure checklist: Completed Consent: Before the procedure and under the influence of no sedative(s), amnesic(s), or anxiolytics, the patient was informed of the treatment options, risks and possible complications. To fulfill our ethical and legal obligations, as recommended by the American Medical Association's Code of Ethics, I have informed the patient of my clinical impression; the nature and purpose of the treatment or procedure; the risks, benefits, and possible complications of the intervention; the alternatives, including doing nothing; the risk(s) and benefit(s) of the alternative treatment(s) or procedure(s); and the risk(s) and benefit(s) of doing nothing. The patient was provided information about the general risks and possible complications associated with the procedure. These may include, but are not limited to: failure to achieve desired goals, infection, bleeding, organ or nerve damage, allergic reactions, paralysis, and death. In addition, the patient was informed of those risks and complications associated to the procedure, such as failure to decrease pain; infection; bleeding; organ or nerve damage with subsequent damage to sensory, motor, and/or autonomic systems, resulting in permanent pain, numbness, and/or weakness of one or several areas of the body; allergic reactions; (i.e.: anaphylactic reaction); and/or death. Furthermore, the patient was informed of those risks and complications associated with the medications. These include, but are not limited to: allergic reactions (i.e.: anaphylactic or anaphylactoid  reaction(s)); adrenal axis suppression; blood sugar elevation that in diabetics may result  in ketoacidosis or comma; water retention that in patients with history of congestive heart failure may result in shortness of breath, pulmonary edema, and decompensation with resultant heart failure; weight gain; swelling or edema; medication-induced neural toxicity; particulate matter embolism and blood vessel occlusion with resultant organ, and/or nervous system infarction; and/or aseptic necrosis of one or more joints. Finally, the patient was informed that Medicine is not an exact science; therefore, there is also the possibility of unforeseen or unpredictable risks and/or possible complications that may result in a catastrophic outcome. The patient indicated having understood very clearly. We have given the patient no guarantees and we have made no promises. Enough time was given to the patient to ask questions, all of which were answered to the patient's satisfaction. [name] has indicated that she wanted to continue with the procedure. Attestation: I, the ordering provider, attest that I have discussed with the patient the benefits, risks, side-effects, alternatives, likelihood of achieving goals, and potential problems during recovery for the procedure that I have provided informed consent. Date  Time: 05/06/2019  8:53 AM  Pre-Procedure Preparation:  Monitoring: As per clinic protocol. Respiration, ETCO2, SpO2, BP, heart rate and rhythm monitor placed and checked for adequate function Safety Precautions: Patient was assessed for positional comfort and pressure points before starting the procedure. Time-out: I initiated and conducted the "Time-out" before starting the procedure, as per protocol. The patient was asked to participate by confirming the accuracy of the "Time Out" information. Verification of the correct person, site, and procedure were performed and confirmed by me, the nursing staff, and the  patient. "Time-out" conducted as per Joint Commission's Universal Protocol (UP.01.01.01). Time: 0949  Description of Procedure:          Target Area: Knee Joint Approach: Just above the Lateral tibial plateau, lateral to the infrapatellar tendon. Area Prepped: Entire knee area, from the mid-thigh to the mid-shin. Prepping solution: DuraPrep (Iodine Povacrylex [0.7% available iodine] and Isopropyl Alcohol, 74% w/w) Safety Precautions: Aspiration looking for blood return was conducted prior to all injections. At no point did we inject any substances, as a needle was being advanced. No attempts were made at seeking any paresthesias. Safe injection practices and needle disposal techniques used. Medications properly checked for expiration dates. SDV (single dose vial) medications used. Description of the Procedure: Protocol guidelines were followed. The patient was placed in position over the fluoroscopy table. The target area was identified and the area prepped in the usual manner. Skin & deeper tissues infiltrated with local anesthetic. Appropriate amount of time allowed to pass for local anesthetics to take effect. The procedure needles were then advanced to the target area. Proper needle placement secured. Negative aspiration confirmed. Solution injected in intermittent fashion, asking for systemic symptoms every 0.5cc of injectate. The needles were then removed and the area cleansed, making sure to leave some of the prepping solution back to take advantage of its long term bactericidal properties. Vitals:   05/06/19 0850  BP: (!) 136/94  Pulse: 84  Temp: 98.5 F (36.9 C)  SpO2: 99%  Weight: 245 lb (111.1 kg)  Height: 5' (1.524 m)    Start Time: 0949 hrs. End Time:   hrs. Materials:  Needle(s) Type: Regular needle Gauge: 25G Length: 1.5-in Medication(s): Please see orders for medications and dosing details.  Imaging Guidance:          Type of Imaging Technique: None used Indication(s):  N/A Exposure Time: No patient exposure Contrast: None used. Fluoroscopic Guidance: N/A Ultrasound  Guidance: N/A Interpretation: N/A  Antibiotic Prophylaxis:   Anti-infectives (From admission, onward)   None     Indication(s): None identified  Post-operative Assessment:  Post-procedure Vital Signs:  Pulse/HCG Rate: 84  Temp: 98.5 F (36.9 C) Resp:   BP: (!) 136/94 SpO2: 99 %  EBL: None  Complications: No immediate post-treatment complications observed by team, or reported by patient.  Note: The patient tolerated the entire procedure well. A repeat set of vitals were taken after the procedure and the patient was kept under observation following institutional policy, for this type of procedure. Post-procedural neurological assessment was performed, showing return to baseline, prior to discharge. The patient was provided with post-procedure discharge instructions, including a section on how to identify potential problems. Should any problems arise concerning this procedure, the patient was given instructions to immediately contact us, at any time, without hesitation. In any case, we plan to contact the patient by telephone for a follow-up status report regarding this interventional procedure.  Comments:  No additional relevant information.  Plan of Care  Orders:  Orders Placed This Encounter  Procedures  . KNEE INJECTION    Hyalgan knee injection to be done by MD.    Scheduling Instructions:     Procedure: Intra-articular Hyalgan Knee injection #3     Side(s): Left Knee     Sedation: None     Timeframe: Today    Order Specific Question:   Where will this procedure be performed?    Answer:   [facility]  . KNEE INJECTION    Hyalgan knee injection. Please order Hyalgan.    Standing Status:   Future    Standing Expiration Date:   06/06/2019    Scheduling Instructions:     Procedure: Intra-articular Hyalgan Knee injection #4     Side: Left-sided     Sedation: None      Timeframe: in two (2) weeks    Order Specific Question:   Where will this procedure be performed?    Answer:   [facility]  . Informed Consent Details: Physician/Practitioner Attestation; Transcribe to consent form and obtain patient signature    Provider Attestation: I, [place] Dossie Arbour, MD, (Pain Management Specialist), the physician/practitioner, attest that I have discussed with the patient the benefits, risks, side effects, alternatives, likelihood of achieving goals and potential problems during recovery for the procedure that I have provided informed consent.    Scheduling Instructions:     Procedure: Therapeutic, left sided, intra-articular Hyalgan knee injection     Indications: Chronic left knee pain secondary to primary osteoarthritis of the knee     Note: Always confirm laterality of pain with Ms. Messimer, before procedure.     Transcribe to consent form and obtain patient signature.   Chronic Opioid Analgesic:  Hydrocodone/APAP 5/325, 1 tab PO q 6 hrs (20 mg/day of hydrocodone) MME/day:20 mg/day.   Medications ordered for procedure: Meds ordered this encounter  Medications  . lidocaine (PF) (XYLOCAINE) 1 % injection 5 mL  . ropivacaine (PF) 2 mg/mL (0.2%) (NAROPIN) injection 5 mL  . Sodium Hyaluronate SOSY 2 mL   Medications administered: We administered lidocaine (PF), ropivacaine (PF) 2 mg/mL (0.2%), and Sodium Hyaluronate.  See the medical record for exact dosing, route, and time of administration.  Follow-up plan:   Return in about 2 weeks (around 05/20/2019) for Procedure (no sedation): (L) Hyalgan #4.       Considering:  NOTE: NO RFA until BMI <35. Diagnostic/palliative left L4-5 LESI  Diagnostic left L3 TFESI    Palliative PRN treatment(s):  Palliative bilateral lumbar facet block #3  Palliative left IA Hyalgan knee injection #S2/N3 (last series completed 01/15/2018) Palliative left vs bilateral IA knee injection #3 (w/ steroid) (last done  08/15/18)      Recent Visits Date Type Provider Dept  04/28/19 Office Visit Milinda Pointer, MD [facility]  03/11/19 Procedure visit Milinda Pointer, MD [facility]  02/13/19 Procedure visit Milinda Pointer, MD [facility]  Showing recent visits within past 90 days and meeting all other requirements   Today's Visits Date Type Provider Dept  05/06/19 Procedure visit Milinda Pointer, MD [facility]  Showing today's visits and meeting all other requirements   Future Appointments Date Type Provider Dept  05/20/19 Appointment Milinda Pointer, MD [facility]  08/04/19 Appointment Milinda Pointer, MD [facility]  Showing future appointments within next 90 days and meeting all other requirements   Disposition: Discharge home  Discharge Date & Time: 05/06/2019; 1000 hrs.   Primary Care Physician: Colon Branch, MD Location: [facility] Note by: Gaspar Cola, MD Date: 05/06/2019; Time: 11:12 AM  Disclaimer:  Medicine is not an Chief Strategy Officer. The only guarantee in medicine is that nothing is guaranteed. It is important to note that the decision to proceed with this intervention was based on the information collected from the patient. The Data and conclusions were drawn from the patient's questionnaire, the interview, and the physical examination. Because the information was provided in large part by the patient, it cannot be guaranteed that it has not been purposely or unconsciously manipulated. Every effort has been made to obtain as much relevant data as possible for this evaluation. It is important to note that the conclusions that lead to this procedure are derived in large part from the available data. Always take into account that the treatment will also be dependent on availability of resources and existing treatment guidelines, considered by other Pain Management Practitioners as  being common knowledge and practice, at the time of the intervention. For Medico-Legal purposes, it is also important to point out that variation in procedural techniques and pharmacological choices are the acceptable norm. The indications, contraindications, technique, and results of the above procedure should only be interpreted and judged by a Board-Certified Interventional Pain Specialist with extensive familiarity and expertise in the same exact procedure and technique.

## 2019-05-06 NOTE — Patient Instructions (Signed)
____________________________________________________________________________________________  Post-Procedure Discharge Instructions  Instructions:  Apply ice:   Purpose: This will minimize any swelling and discomfort after procedure.   When: Day of procedure, as soon as you get home.  How: Fill a plastic sandwich bag with crushed ice. Cover it with a small towel and apply to injection site.  How long: (15 min on, 15 min off) Apply for 15 minutes then remove x 15 minutes.  Repeat sequence on day of procedure, until you go to bed.  Apply heat:   Purpose: To treat any soreness and discomfort from the procedure.  When: Starting the next day after the procedure.  How: Apply heat to procedure site starting the day following the procedure.  How long: May continue to repeat daily, until discomfort goes away.  Food intake: Start with clear liquids (like water) and advance to regular food, as tolerated.   Physical activities: Keep activities to a minimum for the first 8 hours after the procedure. After that, then as tolerated.  Driving: If you have received any sedation, be responsible and do not drive. You are not allowed to drive for 24 hours after having sedation.  Blood thinner: (Applies only to those taking blood thinners) You may restart your blood thinner 6 hours after your procedure.  Insulin: (Applies only to Diabetic patients taking insulin) As soon as you can eat, you may resume your normal dosing schedule.  Infection prevention: Keep procedure site clean and dry. Shower daily and clean area with soap and water.  Post-procedure Pain Diary: Extremely important that this be done correctly and accurately. Recorded information will be used to determine the next step in treatment. For the purpose of accuracy, follow these rules:  Evaluate only the area treated. Do not report or include pain from an untreated area. For the purpose of this evaluation, ignore all other areas of pain,  except for the treated area.  After your procedure, avoid taking a long nap and attempting to complete the pain diary after you wake up. Instead, set your alarm clock to go off every hour, on the hour, for the initial 8 hours after the procedure. Document the duration of the numbing medicine, and the relief you are getting from it.  Do not go to sleep and attempt to complete it later. It will not be accurate. If you received sedation, it is likely that you were given a medication that may cause amnesia. Because of this, completing the diary at a later time may cause the information to be inaccurate. This information is needed to plan your care.  Follow-up appointment: Keep your post-procedure follow-up evaluation appointment after the procedure (usually 2 weeks for most procedures, 6 weeks for radiofrequencies). DO NOT FORGET to bring you pain diary with you.   Expect: (What should I expect to see with my procedure?)  From numbing medicine (AKA: Local Anesthetics): Numbness or decrease in pain. You may also experience some weakness, which if present, could last for the duration of the local anesthetic.  Onset: Full effect within 15 minutes of injected.  Duration: It will depend on the type of local anesthetic used. On the average, 1 to 8 hours.   From steroids (Applies only if steroids were used): Decrease in swelling or inflammation. Once inflammation is improved, relief of the pain will follow.  Onset of benefits: Depends on the amount of swelling present. The more swelling, the longer it will take for the benefits to be seen. In some cases, up to 10 days.    Duration: Steroids will stay in the system x 2 weeks. Duration of benefits will depend on multiple posibilities including persistent irritating factors.  Side-effects: If present, they may typically last 2 weeks (the duration of the steroids).  Frequent: Cramps (if they occur, drink Gatorade and take over-the-counter Magnesium 450-500 mg  once to twice a day); water retention with temporary weight gain; increases in blood sugar; decreased immune system response; increased appetite.  Occasional: Facial flushing (red, warm cheeks); mood swings; menstrual changes.  Uncommon: Long-term decrease or suppression of natural hormones; bone thinning. (These are more common with higher doses or more frequent use. This is why we prefer that our patients avoid having any injection therapies in other practices.)   Very Rare: Severe mood changes; psychosis; aseptic necrosis.  From procedure: Some discomfort is to be expected once the numbing medicine wears off. This should be minimal if ice and heat are applied as instructed.  Call if: (When should I call?)  You experience numbness and weakness that gets worse with time, as opposed to wearing off.  New onset bowel or bladder incontinence. (Applies only to procedures done in the spine)  Emergency Numbers:  Durning business hours (Monday - Thursday, 8:00 AM - 4:00 PM) (Friday, 9:00 AM - 12:00 Noon): [phone]  After hours: [phone]  NOTE: If you are having a problem and are unable connect with, or to talk to a provider, then go to your nearest urgent care or emergency department. If the problem is serious and urgent, please call 911. ____________________________________________________________________________________________   ____________________________________________________________________________________________  Preparing for your procedure (without sedation)  Procedure appointments are limited to planned procedures: . No Prescription Refills. . No disability issues will be discussed. . No medication changes will be discussed.  Instructions: . Oral Intake: Do not eat or drink anything for at least 3 hours prior to your procedure. . Transportation: Unless otherwise stated by your physician, you may drive yourself after the procedure. . Blood Pressure Medicine:  Take your blood pressure medicine with a sip of water the morning of the procedure. . Blood thinners: Notify our staff if you are taking any blood thinners. Depending on which one you take, there will be specific instructions on how and when to stop it. . Diabetics on insulin: Notify the staff so that you can be scheduled 1st case in the morning. If your diabetes requires high dose insulin, take only  of your normal insulin dose the morning of the procedure and notify the staff that you have done so. . Preventing infections: Shower with an antibacterial soap the morning of your procedure.  . Build-up your immune system: Take 1000 mg of Vitamin C with every meal (3 times a day) the day prior to your procedure. . Antibiotics: Inform the staff if you have a condition or reason that requires you to take antibiotics before dental procedures. . Pregnancy: If you are pregnant, call and cancel the procedure. . Sickness: If you have a cold, fever, or any active infections, call and cancel the procedure. . Arrival: You must be in the facility at least 30 minutes prior to your scheduled procedure. . Children: Do not bring any children with you. . Dress appropriately: Bring dark clothing that you would not mind if they get stained. . Valuables: Do not bring any jewelry or valuables.  Reasons to call and reschedule or cancel your procedure: (Following these recommendations will minimize the risk of a serious complication.) . Surgeries: Avoid having procedures within 2 weeks   of any surgery. (Avoid for 2 weeks before or after any surgery). . Flu Shots: Avoid having procedures within 2 weeks of a flu shots or . (Avoid for 2 weeks before or after immunizations). . Barium: Avoid having a procedure within 7-10 days after having had a radiological study involving the use of radiological contrast. (Myelograms, Barium swallow or enema study). . Heart attacks: Avoid any elective procedures or surgeries for the initial 6  months after a "Myocardial Infarction" (Heart Attack). . Blood thinners: It is imperative that you stop these medications before procedures. Let us know if you if you take any blood thinner.  . Infection: Avoid procedures during or within two weeks of an infection (including chest colds or gastrointestinal problems). Symptoms associated with infections include: Localized redness, fever, chills, night sweats or profuse sweating, burning sensation when voiding, cough, congestion, stuffiness, runny nose, sore throat, diarrhea, nausea, vomiting, cold or Flu symptoms, recent or current infections. It is specially important if the infection is over the area that we intend to treat. . Heart and lung problems: Symptoms that may suggest an active cardiopulmonary problem include: cough, chest pain, breathing difficulties or shortness of breath, dizziness, ankle swelling, uncontrolled high or unusually low blood pressure, and/or palpitations. If you are experiencing any of these symptoms, cancel your procedure and contact your primary care physician for an evaluation.  Remember:  Regular Business hours are:  Monday to Thursday 8:00 AM to 4:00 PM  Provider's Schedule: Arlin Savona, MD:  Procedure days: Tuesday and Thursday 7:30 AM to 4:00 PM  Bilal Lateef, MD:  Procedure days: Monday and Wednesday 7:30 AM to 4:00 PM ____________________________________________________________________________________________     

## 2019-05-07 ENCOUNTER — Telehealth: Payer: Self-pay

## 2019-05-07 NOTE — Telephone Encounter (Signed)
Pt was called and no problem was reported. 

## 2019-05-19 NOTE — Progress Notes (Deleted)
No show

## 2019-05-20 ENCOUNTER — Ambulatory Visit: Payer: Medicare HMO | Admitting: Pain Medicine

## 2019-05-21 ENCOUNTER — Ambulatory Visit: Payer: Medicare HMO | Admitting: Pain Medicine

## 2019-05-29 ENCOUNTER — Other Ambulatory Visit: Payer: Medicare HMO

## 2019-06-18 ENCOUNTER — Other Ambulatory Visit: Payer: Self-pay | Admitting: Internal Medicine

## 2019-06-18 MED ORDER — ATORVASTATIN CALCIUM 40 MG PO TABS: 40.0000 mg | ORAL_TABLET | Freq: Every day | ORAL | 0 refills | Status: DC

## 2019-07-01 DIAGNOSIS — H524 Presbyopia: Secondary | ICD-10-CM | POA: Diagnosis not present

## 2019-07-03 ENCOUNTER — Telehealth: Payer: Self-pay | Admitting: Internal Medicine

## 2019-07-03 NOTE — Telephone Encounter (Signed)
Please call the patient, arrange a lab appointment, we recently increase Lipitor and need to check her cholesterol.

## 2019-07-10 ENCOUNTER — Other Ambulatory Visit (INDEPENDENT_AMBULATORY_CARE_PROVIDER_SITE_OTHER): Payer: Medicare HMO

## 2019-07-10 ENCOUNTER — Other Ambulatory Visit: Payer: Self-pay

## 2019-07-10 DIAGNOSIS — E785 Hyperlipidemia, unspecified: Secondary | ICD-10-CM | POA: Diagnosis not present

## 2019-07-10 LAB — ALT: ALT: 18 U/L (ref 0–35)

## 2019-07-10 LAB — LIPID PANEL
Cholesterol: 190 mg/dL (ref 0–200)
HDL: 56.3 mg/dL (ref >39.00)
LDL Cholesterol: 120 mg/dL — ABNORMAL HIGH (ref 0–99)
NonHDL: 133.41
Total CHOL/HDL Ratio: 3
Triglycerides: 67 mg/dL (ref 0.0–149.0)
VLDL: 13.4 mg/dL (ref 0.0–40.0)

## 2019-07-10 LAB — AST: AST: 18 U/L (ref 0–37)

## 2019-07-15 ENCOUNTER — Other Ambulatory Visit: Payer: Self-pay | Admitting: Internal Medicine

## 2019-08-03 NOTE — Progress Notes (Signed)
Patient: Wanda [name]  Service Category: E/M  Provider: Gaspar Cola, MD  DOB: [birth year]  DOS: 08/04/2019  Referring Provider: Colon Branch, MD  MRN: [id number]  Setting: Ambulatory outpatient  PCP: Colon Branch, MD  Type: Established Patient  Specialty: Interventional Pain Management    Location: External  Delivery: TeleHealth     Virtual Encounter - Pain Management PROVIDER NOTE: Information contained herein reflects review and annotations entered in association with encounter. Interpretation of such information and data should be left to medically-trained personnel. Information provided to patient can be located elsewhere in the medical record under "Patient Instructions". Document created using STT-dictation technology, any transcriptional errors that may result from process are unintentional.    Contact & Pharmacy Preferred: [phone] Home: [phone] (home) Mobile: [phone] (mobile) E-mail: No e-mail address on record  [facility] [phone] [place], [place] [address] [place] [address] Phone: [phone] Fax: [phone]   Pre-screening  Ms. Dickerson offered "in-person" vs "virtual" encounter. She indicated preferring virtual for this encounter.   Reason COVID-19*  Social distancing based on CDC and AMA recommendations.   I contacted Elonda Krut Lair on 08/04/2019 via telephone.      I clearly identified myself as Gaspar Cola, MD. I verified that I was speaking with the correct person using two identifiers (Name: Wanda [name], and date of birth: 1946/02/03).  Consent I sought verbal advanced consent from [name] for virtual visit interactions. I informed Ms. Kreamer of possible security and privacy concerns, risks, and limitations associated with providing "not-in-person" medical evaluation and management services. I also informed Ms. Albritton of the availability of  "in-person" appointments. Finally, I informed her that there would be a charge for the virtual visit and that she could be  personally, fully or partially, financially responsible for it. Ms. Rolstad expressed understanding and agreed to proceed.   Historic Elements   [name] is a 74 y.o. year old, female patient evaluated today after her last encounter by our practice on 05/07/2019. Ms. Frizell  has a past medical history of Allergy, Chronic back pain, Depression, DJD (degenerative joint disease), Hyperlipidemia, Hypertension, Menopause, Musculoskeletal pain (12/07/2015), and TMJ PAIN (05/17/2010). She also  has a past surgical history that includes G3 P3 . Ms. Arciniega has a current medication list which includes the following prescription(s): aspirin ec, atorvastatin, [START ON 09/17/2019] calcium carbonate, [START ON 09/17/2019] vitamin d3, [START ON 08/09/2019] cyclobenzaprine, [START ON 08/09/2019] gabapentin, hydrochlorothiazide, [START ON 08/09/2019] hydrocodone-acetaminophen, [START ON 09/08/2019] hydrocodone-acetaminophen, [START ON 10/08/2019] hydrocodone-acetaminophen, lisinopril, and sertraline. She  reports that she has never smoked. She has never used smokeless tobacco. She reports that she does not drink alcohol or use drugs. Ms. Werning is allergic to simvastatin.   HPI  Today, she is being contacted for medication management.  The patient indicates doing well with the current medication regimen. No adverse reactions or side effects reported to the medications.  Today she also indicates having more of the low back pain, bilaterally, with the left being worse than the right.  She indicates that she also has a little bit of a numbness going down the right lower extremity but only to the area of the thigh.  She indicated that she will probably be calling us to set up a bilateral lumbar facet block under fluoroscopic guidance and IV sedation, as soon as she can get something straighten up at  home.  Pharmacotherapy Assessment  Analgesic: Hydrocodone/APAP 5/325, 1 tab PO q 6 hrs (20 mg/day of hydrocodone) MME/day:20 mg/day.   Monitoring: Pharmacotherapy: No side-effects or adverse reactions reported. [place] PMP: PDMP reviewed during this encounter.       Compliance: No problems identified. Effectiveness: Clinically acceptable. Plan: Refer to "POC".  UDS:  Summary  Date Value Ref Range Status  02/13/2019 Note  Final    Comment:    ==================================================================== [employer] Select 13 (MW) ==================================================================== Test                             Result       Flag       Units Drug Present and Declared for Prescription Verification   Hydrocodone                    645          EXPECTED   ng/mg creat   Hydromorphone                  68           EXPECTED   ng/mg creat   Dihydrocodeine                 54           EXPECTED   ng/mg creat   Norhydrocodone                 905          EXPECTED   ng/mg creat    Sources of hydrocodone include scheduled prescription medications.    Hydromorphone, dihydrocodeine and norhydrocodone are expected    metabolites of hydrocodone. Hydromorphone and dihydrocodeine are    also available as scheduled prescription medications. ==================================================================== Test                      Result    Flag   Units      Ref Range   Creatinine              265              mg/dL      >=20 ==================================================================== Declared Medications:  The flagging and interpretation on this report are based on the  following declared medications.  Unexpected results may arise from  inaccuracies in the declared medications.  **Note: The testing scope of this panel includes these medications:  Hydrocodone  **Note: The testing scope of this panel does not include the  following reported medications:   Acetaminophen  Aspirin  Atorvastatin (Lipitor)  Cholecalciferol  Cyclobenzaprine  Gabapentin  Hydrochlorothiazide  Lisinopril  Sertraline ==================================================================== For clinical consultation, please call [phone]. ====================================================================    Laboratory Chemistry Profile (12 mo)  Renal: 02/13/2019: BUN 13; BUN/Creatinine Ratio 19; Creatinine, Ser 0.67  Lab Results  Component Value Date   GFR 105.13 12/24/2018   GFRAA 103 02/13/2019   GFRNONAA 89 02/13/2019   Hepatic: 02/13/2019: Albumin 4.5 Lab Results  Component Value Date   AST 18 07/10/2019   ALT 18 07/10/2019   Other: 02/13/2019: 25-Hydroxy, Vitamin D 22; 25-Hydroxy, Vitamin D-2 3.4; 25-Hydroxy, Vitamin D-3 19; CRP 8; Sed Rate 47; Vitamin B-12 355 Note: Above Lab results reviewed.  Imaging  MM 3D SCREEN BREAST BILATERAL CLINICAL DATA:  Screening.  EXAM: DIGITAL SCREENING BILATERAL MAMMOGRAM WITH TOMO AND CAD  COMPARISON:  Previous exam(s).  ACR Breast Density Category c: The  breast tissue is heterogeneously dense, which may obscure small masses.  FINDINGS: There are no findings suspicious for malignancy. Images were processed with CAD.  IMPRESSION: No mammographic evidence of malignancy. A result letter of this screening mammogram will be mailed directly to the patient.  RECOMMENDATION: Screening mammogram in one year. (Code:[id number]-01Y)  BI-RADS CATEGORY  1: Negative.  Electronically Signed   By: Dorise Bullion III M.D   On: 08/08/2018 17:24   Assessment  The primary encounter diagnosis was Chronic pain syndrome. Diagnoses of Chronic low back pain (Bilateral) (L>R) w/ sciatica (Left), Lumbar facet syndrome (Bilateral) (R>L), Chronic knee pain (Primary Area of Pain) (Bilateral) (L>R), Chronic musculoskeletal pain, Neurogenic pain, Fibromyalgia, and Vitamin D deficiency were also pertinent to this visit.  Plan of  Care  Problem-specific:  No problem-specific Assessment & Plan notes found for this encounter.  I am having Son[place] L. Basic start on HYDROcodone-acetaminophen and HYDROcodone-acetaminophen. I am also having her maintain her aspirin EC, hydrochlorothiazide, sertraline, lisinopril, atorvastatin, HYDROcodone-acetaminophen, cyclobenzaprine, gabapentin, calcium carbonate, and Vitamin D3.  Pharmacotherapy (Medications Ordered): Meds ordered this encounter  Medications  . HYDROcodone-acetaminophen (NORCO/VICODIN) 5-325 MG tablet    Sig: Take 1 tablet by mouth every 6 (six) hours as needed for severe pain. Must last 30 days    Dispense:  120 tablet    Refill:  0    Chronic Pain: STOP Act (Not applicable) Fill 1 day early if closed on refill date. Do not fill until: 08/09/2019. To last until: 09/08/2019. Avoid benzodiazepines within 8 hours of opioids  . HYDROcodone-acetaminophen (NORCO/VICODIN) 5-325 MG tablet    Sig: Take 1 tablet by mouth every 6 (six) hours as needed for severe pain. Must last 30 days    Dispense:  120 tablet    Refill:  0    Chronic Pain: STOP Act (Not applicable) Fill 1 day early if closed on refill date. Do not fill until: 09/08/2019. To last until: 10/08/2019. Avoid benzodiazepines within 8 hours of opioids  . HYDROcodone-acetaminophen (NORCO/VICODIN) 5-325 MG tablet    Sig: Take 1 tablet by mouth every 6 (six) hours as needed for severe pain. Must last 30 days    Dispense:  120 tablet    Refill:  0    Chronic Pain: STOP Act (Not applicable) Fill 1 day early if closed on refill date. Do not fill until: 10/08/2019. To last until: 11/07/2019. Avoid benzodiazepines within 8 hours of opioids  . cyclobenzaprine (FLEXERIL) 10 MG tablet    Sig: Take 1 tablet (10 mg total) by mouth at bedtime.    Dispense:  90 tablet    Refill:  1    Fill one day early if pharmacy is closed on scheduled refill date. May substitute for generic if available.  . gabapentin (NEURONTIN) 300 MG capsule     Sig: Take 1 capsule (300 mg total) by mouth 3 (three) times daily.    Dispense:  90 capsule    Refill:  5    Fill one day early if pharmacy is closed on scheduled refill date. May substitute for generic if available.  . calcium carbonate (CALCIUM 600) 600 MG TABS tablet    Sig: Take 1 tablet (600 mg total) by mouth 2 (two) times daily with a meal.    Dispense:  180 tablet    Refill:  3    Fill one day early if pharmacy is closed on scheduled refill date. May substitute for generic if available.  . Cholecalciferol (VITAMIN D3) 125 MCG (  5000 UT) CAPS    Sig: Take 1 capsule (5,000 Units total) by mouth daily with breakfast. Take along with calcium and magnesium.    Dispense:  90 capsule    Refill:  3    Fill one day early if pharmacy is closed on scheduled refill date. May substitute for generic if available. May substitute with similar over-the-counter product.   Orders:  Orders Placed This Encounter  Procedures  . L-FCT Blk (PRN)    Scheduling timeframe: (PRN procedure) Ms. Stricker will call when needed. Clinical indication: Axial low back pain. 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  Sedation: Usually done with sedation. (May be done without sedation if so desired by patient.) Requirements: NPO x 8 hrs.; Driver; Stop blood thinners. Interval: No sooner than two weeks for diagnostic or therapeutic. No sooner than every other month for palliative.    Standing Status:   Standing    Number of Occurrences:   5    Standing Expiration Date:   08/03/2020    Scheduling Instructions:     Procedure: Lumbar facet block (AKA.: Lumbosacral medial branch nerve block)     Level: L3-4, L4-5, & L5-S1 Facets (L2, L3, L4, L5, & S1 Medial Branch Nerves)     Laterality: Bilateral    Order Specific Question:   Where will this procedure be performed?    Answer:   [facility]   Follow-up plan:   Return in about 3 months (around 11/05/2019) for (VV), (MM), in addition, PRN Procedure(s): (B) L-FCT BLK #3,  (w/ Sedation).      Considering:  NOTE: NO RFA until BMI <35. Diagnostic/palliative left L4-5 LESI  Diagnostic left L3 TFESI    Palliative PRN treatment(s):  Palliative bilateral lumbar facet block #3  Palliative left IA Hyalgan knee injection #S2/N3 (last series completed 01/15/2018) Palliative left vs bilateral IA knee injection #3 (w/ steroid) (last done 08/15/18)     Recent Visits Date Type Provider Dept  05/06/19 Procedure visit Milinda Pointer, MD [facility]  Showing recent visits within past 90 days and meeting all other requirements   Today's Visits Date Type Provider Dept  08/04/19 Telemedicine Milinda Pointer, MD [facility]  Showing today's visits and meeting all other requirements   Future Appointments No visits were found meeting these conditions.  Showing future appointments within next 90 days and meeting all other requirements   I discussed the assessment and treatment plan with the patient. The patient was provided an opportunity to ask questions and all were answered. The patient agreed with the plan and demonstrated an understanding of the instructions.  Patient advised to call back or seek an in-person evaluation if the symptoms or condition worsens.  Total duration of non-face-to-face encounter: 13 minutes.  Note by: Gaspar Cola, MD Date: 08/04/2019; Time: 10:15 AM

## 2019-08-04 ENCOUNTER — Other Ambulatory Visit: Payer: Self-pay

## 2019-08-04 ENCOUNTER — Ambulatory Visit: Payer: Medicare HMO | Attending: Pain Medicine | Admitting: Pain Medicine

## 2019-08-04 ENCOUNTER — Telehealth: Payer: Self-pay

## 2019-08-04 DIAGNOSIS — M797 Fibromyalgia: Secondary | ICD-10-CM | POA: Diagnosis not present

## 2019-08-04 DIAGNOSIS — M47896 Other spondylosis, lumbar region: Secondary | ICD-10-CM

## 2019-08-04 DIAGNOSIS — E559 Vitamin D deficiency, unspecified: Secondary | ICD-10-CM

## 2019-08-04 DIAGNOSIS — M25562 Pain in left knee: Secondary | ICD-10-CM | POA: Diagnosis not present

## 2019-08-04 DIAGNOSIS — G8929 Other chronic pain: Secondary | ICD-10-CM

## 2019-08-04 DIAGNOSIS — M25561 Pain in right knee: Secondary | ICD-10-CM

## 2019-08-04 DIAGNOSIS — G894 Chronic pain syndrome: Secondary | ICD-10-CM | POA: Diagnosis not present

## 2019-08-04 DIAGNOSIS — M5442 Lumbago with sciatica, left side: Secondary | ICD-10-CM

## 2019-08-04 DIAGNOSIS — M47816 Spondylosis without myelopathy or radiculopathy, lumbar region: Secondary | ICD-10-CM

## 2019-08-04 DIAGNOSIS — M7918 Myalgia, other site: Secondary | ICD-10-CM

## 2019-08-04 DIAGNOSIS — M5441 Lumbago with sciatica, right side: Secondary | ICD-10-CM | POA: Diagnosis not present

## 2019-08-04 DIAGNOSIS — M792 Neuralgia and neuritis, unspecified: Secondary | ICD-10-CM

## 2019-08-04 MED ORDER — HYDROCODONE-ACETAMINOPHEN 5-325 MG PO TABS: 1.0000 | ORAL_TABLET | Freq: Four times a day (QID) | ORAL | 0 refills | Status: DC | PRN

## 2019-08-04 MED ORDER — GABAPENTIN 300 MG PO CAPS: 300.0000 mg | ORAL_CAPSULE | Freq: Three times a day (TID) | ORAL | 5 refills | Status: DC

## 2019-08-04 MED ORDER — CALCIUM CARBONATE 600 MG PO TABS: 600.0000 mg | ORAL_TABLET | Freq: Two times a day (BID) | ORAL | 3 refills | Status: AC

## 2019-08-04 MED ORDER — CYCLOBENZAPRINE HCL 10 MG PO TABS: 10.0000 mg | ORAL_TABLET | Freq: Every day | ORAL | 1 refills | Status: DC

## 2019-08-04 MED ORDER — VITAMIN D3 125 MCG (5000 UT) PO CAPS: 1.0000 | ORAL_CAPSULE | Freq: Every day | ORAL | 3 refills | Status: DC

## 2019-08-04 NOTE — Patient Instructions (Signed)

## 2019-08-04 NOTE — Telephone Encounter (Signed)
LM to call office to go over pre appointment questions.

## 2019-08-13 ENCOUNTER — Ambulatory Visit: Payer: Medicare HMO

## 2019-08-14 ENCOUNTER — Ambulatory Visit: Payer: Medicare HMO | Attending: Internal Medicine

## 2019-08-14 DIAGNOSIS — Z23 Encounter for immunization: Secondary | ICD-10-CM

## 2019-08-14 NOTE — Progress Notes (Signed)
   Covid-19 Vaccination Clinic  Name:  OLEAN BASHER    MRN: [id number] DOB: 1946-02-19  08/14/2019  Ms. Gonzalo was observed post Covid-19 immunization for 15 minutes without incidence. She was provided with Vaccine Information Sheet and instruction to access the V-Safe system.   Ms. Sung was instructed to call 911 with any severe reactions post vaccine: [name] Difficulty breathing  . Swelling of your face and throat  . A fast heartbeat  . A bad rash all over your body  . Dizziness and weakness    Immunizations Administered    Name Date Dose VIS Date Route   Pfizer COVID-19 Vaccine 08/14/2019 10:48 AM 0.3 mL 07/04/2019 Intramuscular   Manufacturer: [employer]   Lot: [id number]   [place]: [id number]

## 2019-09-04 ENCOUNTER — Ambulatory Visit: Payer: Medicare HMO | Attending: Internal Medicine

## 2019-09-04 DIAGNOSIS — Z23 Encounter for immunization: Secondary | ICD-10-CM | POA: Insufficient documentation

## 2019-09-04 NOTE — Progress Notes (Signed)
   Covid-19 Vaccination Clinic  Name:  Wanda [name]    MRN: [id number] DOB: 11/28/1945  09/04/2019  [name] was observed post Covid-19 immunization for 15 minutes without incidence. She was provided with Vaccine Information Sheet and instruction to access the V-Safe system.   [name] was instructed to call 911 with any severe reactions post vaccine: [name] Difficulty breathing  . Swelling of your face and throat  . A fast heartbeat  . A bad rash all over your body  . Dizziness and weakness    Immunizations Administered    Name Date Dose VIS Date Route   Pfizer COVID-19 Vaccine 09/04/2019 12:39 PM 0.3 mL 07/04/2019 Intramuscular   Manufacturer: [place]   Lot: [id number]   [place]: [id number]

## 2019-09-16 ENCOUNTER — Other Ambulatory Visit: Payer: Self-pay | Admitting: Internal Medicine

## 2019-09-24 ENCOUNTER — Telehealth: Payer: Self-pay | Admitting: Internal Medicine

## 2019-09-24 ENCOUNTER — Other Ambulatory Visit: Payer: Self-pay

## 2019-09-24 NOTE — Telephone Encounter (Signed)
Needs an appt please  

## 2019-09-24 NOTE — Telephone Encounter (Signed)
Called back # [phone]  Patient request something for urinary complaint, believes she has a uti.

## 2019-09-25 ENCOUNTER — Encounter: Payer: Self-pay | Admitting: Internal Medicine

## 2019-09-25 ENCOUNTER — Other Ambulatory Visit: Payer: Self-pay

## 2019-09-25 ENCOUNTER — Other Ambulatory Visit (HOSPITAL_COMMUNITY)
Admission: RE | Admit: 2019-09-25 | Discharge: 2019-09-25 | Disposition: A | Discharge status: Home / Self Care | Payer: Medicare HMO | Source: Ambulatory Visit | Attending: Internal Medicine | Admitting: Internal Medicine

## 2019-09-25 ENCOUNTER — Ambulatory Visit (INDEPENDENT_AMBULATORY_CARE_PROVIDER_SITE_OTHER): Payer: Medicare HMO | Admitting: Internal Medicine

## 2019-09-25 VITALS — BP 117/67 | HR 73 | Temp 96.0°F | Resp 18 | Ht 65.0 in | Wt 245.4 lb

## 2019-09-25 DIAGNOSIS — N390 Urinary tract infection, site not specified: Secondary | ICD-10-CM | POA: Diagnosis not present

## 2019-09-25 DIAGNOSIS — N898 Other specified noninflammatory disorders of vagina: Secondary | ICD-10-CM

## 2019-09-25 DIAGNOSIS — E785 Hyperlipidemia, unspecified: Secondary | ICD-10-CM | POA: Diagnosis not present

## 2019-09-25 DIAGNOSIS — R399 Unspecified symptoms and signs involving the genitourinary system: Secondary | ICD-10-CM | POA: Insufficient documentation

## 2019-09-25 DIAGNOSIS — Z6841 Body Mass Index (BMI) 40.0 and over, adult: Secondary | ICD-10-CM | POA: Diagnosis not present

## 2019-09-25 DIAGNOSIS — E559 Vitamin D deficiency, unspecified: Secondary | ICD-10-CM | POA: Diagnosis not present

## 2019-09-25 LAB — URINALYSIS, ROUTINE W REFLEX MICROSCOPIC
Bilirubin Urine: NEGATIVE
Hgb urine dipstick: NEGATIVE
Ketones, ur: NEGATIVE
Leukocytes,Ua: NEGATIVE
Nitrite: NEGATIVE
RBC / HPF: NONE SEEN (ref 0–?)
Specific Gravity, Urine: 1.02 (ref 1.000–1.030)
Total Protein, Urine: NEGATIVE
Urine Glucose: NEGATIVE
Urobilinogen, UA: 0.2 (ref 0.0–1.0)
pH: 5.5 (ref 5.0–8.0)

## 2019-09-25 LAB — POC URINALSYSI DIPSTICK (AUTOMATED)
Bilirubin, UA: NEGATIVE
Blood, UA: NEGATIVE
Glucose, UA: NEGATIVE
Ketones, UA: NEGATIVE
Leukocytes, UA: NEGATIVE
Nitrite, UA: NEGATIVE
Protein, UA: NEGATIVE
Spec Grav, UA: 1.02 (ref 1.010–1.025)
Urobilinogen, UA: 0.2 E.U./dL
pH, UA: 6 (ref 5.0–8.0)

## 2019-09-25 NOTE — Progress Notes (Signed)
Pre visit review using our clinic review tool, if applicable. No additional management support is needed unless otherwise documented below in the visit note. 

## 2019-09-25 NOTE — Assessment & Plan Note (Signed)
UTI: Udip: wnl  We will get a UA, urine culture and urinary wet prep (Gardnerella and Candida). Treat based on results. High cholesterol: Started Lipitor several months ago, follow-up FLP did not improve, admits to poor compliance.  Benefits of statins including prevention of CAD and strokes discussed, recommend good compliance and labs in 2 months. Vitamin D deficiency: S/p ergocalciferol, currently on no meds, recommend OTC vitamin D 2000 units labs in 2 months RTC for labs 2 months Next physical 9-20 21

## 2019-09-25 NOTE — Progress Notes (Signed)
Subjective:    Patient ID: Wanda [name], female    DOB: 1945-10-30, 74 y.o.   MRN: [id number]  DOS:  09/25/2019 Type of visit - description: Acute Thinks she has a UTI: 10 days history of urinary frequency and some different urinary odor Admits to some dysuria but no gross hematuria. I asked about vaginal discharge and she said maybe "a little". No vaginal rash or itching. Mild suprapubic discomfort. Denies fever chills  High cholesterol: Poor compliance  Vitamin D deficiency: Currently not on supplements    Review of Systems See above   Past Medical History:  Diagnosis Date  . Allergy    RHINITIS  . Chronic back pain   . Depression   . DJD (degenerative joint disease)    back pain , on disability  . Hyperlipidemia   . Hypertension   . Menopause    onset age 29   . Musculoskeletal pain 12/07/2015  . TMJ PAIN 05/17/2010   Qualifier: Diagnosis of  By: Larose Kells MD, [place]     Past Surgical History:  Procedure Laterality Date  . G3 P3       Allergies as of 09/25/2019      Reactions   Simvastatin    REACTION: cramps      Medication List       Accurate as of September 25, 2019  9:52 AM. If you have any questions, ask your nurse or doctor.        aspirin EC 81 MG tablet Take 81 mg by mouth daily.   atorvastatin 40 MG tablet Commonly known as: LIPITOR Take 1 tablet (40 mg total) by mouth at bedtime.   calcium carbonate 600 MG Tabs tablet Commonly known as: Calcium 600 Take 1 tablet (600 mg total) by mouth 2 (two) times daily with a meal.   cyclobenzaprine 10 MG tablet Commonly known as: FLEXERIL Take 1 tablet (10 mg total) by mouth at bedtime.   gabapentin 300 MG capsule Commonly known as: NEURONTIN Take 1 capsule (300 mg total) by mouth 3 (three) times daily.   hydrochlorothiazide 25 MG tablet Commonly known as: HYDRODIURIL Take 1 tablet (25 mg total) by mouth daily.   HYDROcodone-acetaminophen 5-325 MG tablet Commonly known as: NORCO/VICODIN Take 1  tablet by mouth every 6 (six) hours as needed for severe pain. Must last 30 days   HYDROcodone-acetaminophen 5-325 MG tablet Commonly known as: NORCO/VICODIN Take 1 tablet by mouth every 6 (six) hours as needed for severe pain. Must last 30 days   HYDROcodone-acetaminophen 5-325 MG tablet Commonly known as: NORCO/VICODIN Take 1 tablet by mouth every 6 (six) hours as needed for severe pain. Must last 30 days Start taking on: October 08, 2019   lisinopril 10 MG tablet Commonly known as: ZESTRIL Take 1 tablet (10 mg total) by mouth daily.   sertraline 50 MG tablet Commonly known as: ZOLOFT Take 3 tablets (150 mg total) by mouth daily.   Vitamin D3 125 MCG (5000 UT) Caps Take 1 capsule (5,000 Units total) by mouth daily with breakfast. Take along with calcium and magnesium.         Objective:   Physical Exam BP 117/67 (BP Location: Left Arm, Patient Position: Sitting, Cuff Size: Normal)   Pulse 73   Temp (!) 96 F (35.6 C) (Temporal)   Resp 18   Ht 5\' 5"  (1.651 m)   Wt 245 lb 6 oz (111.3 kg)   SpO2 100%   BMI 40.83 kg/m  General:  Well developed, NAD, BMI noted.  HEENT:  Normocephalic . Face symmetric, atraumatic  Abdomen:  Not distended, soft mild suprapubic discomfort upon palpation Skin: Not pale. Not jaundice Lower extremities: no pretibial edema bilaterally  Neurologic:  alert & oriented X3.  Speech normal, gait and transfer limited by MSK issues Psych--  Cognition and judgment appear intact.  Cooperative with normal attention span and concentration.  Behavior appropriate. No anxious or depressed appearing.     Assessment      Assessment HTN Hyperlipidemia Depression Morbid obesity MSK: DJD, chronic back-knee pain, hydrocodone /gabapentin rx by DR Shelly Rubenstein D  Def  PLAN: UTI: Udip: wnl  We will get a UA, urine culture and urinary wet prep (Gardnerella and Candida). Treat based on results. High cholesterol: Started Lipitor several months ago,  follow-up FLP did not improve, admits to poor compliance.  Benefits of statins including prevention of CAD and strokes discussed, recommend good compliance and labs in 2 months. Vitamin D deficiency: S/p ergocalciferol, currently on no meds, recommend OTC vitamin D 2000 units labs in 2 months RTC for labs 2 months Next physical 9-20 21  This visit occurred during the SARS-CoV-2 public health emergency.  Safety protocols were in place, including screening questions prior to the visit, additional usage of staff PPE, and extensive cleaning of exam room while observing appropriate contact time as indicated for disinfecting solutions.

## 2019-09-25 NOTE — Patient Instructions (Addendum)
   GO TO THE FRONT DESK Come back for labs in 2 months, fasting we will check your cholesterol and vitamin D  Start taking vitamin D over-the-counter, 2000 units every day

## 2019-09-26 LAB — URINE CULTURE
MICRO NUMBER:: 10214547
SPECIMEN QUALITY:: ADEQUATE

## 2019-09-30 LAB — URINE CYTOLOGY ANCILLARY ONLY
Bacterial Vaginitis-Urine: NEGATIVE
Candida Urine: NEGATIVE

## 2019-10-23 ENCOUNTER — Encounter: Payer: Self-pay | Admitting: Pain Medicine

## 2019-10-26 NOTE — Progress Notes (Addendum)
Patient: Wanda [name]  Service Category: E/M  Provider: Gaspar Cola, MD  DOB: 06/17/1946  DOS: 10/27/2019  Location: Office  MRN: [id number]  Setting: Ambulatory outpatient  Referring Provider: Colon Branch, MD  Type: Established Patient  Specialty: Interventional Pain Management  PCP: Colon Branch, MD  Location: Remote location  Delivery: TeleHealth     Virtual Encounter - Pain Management PROVIDER NOTE: Information contained herein reflects review and annotations entered in association with encounter. Interpretation of such information and data should be left to medically-trained personnel. Information provided to patient can be located elsewhere in the medical record under "Patient Instructions". Document created using STT-dictation technology, any transcriptional errors that may result from process are unintentional.    Contact & Pharmacy Preferred: [phone] Home: [phone] (home) Mobile: [phone] (mobile) E-mail: No e-mail address on record  [facility] [phone] [place], [place] - [address] [place] [address] Phone: [phone] Fax: [phone]   Pre-screening  Wanda [name] offered "in-person" vs "virtual" encounter. She indicated preferring virtual for this encounter.   Reason COVID-19*  Social distancing based on CDC and AMA recommendations.   I contacted Avina Eberle [place] on 10/27/2019 via telephone.      I clearly identified myself as Gaspar Cola, MD. I verified that I was speaking with the correct person using two identifiers (Name: Wanda [name], and date of birth: 02-20-1946).  Consent I sought verbal advanced consent from [name] for virtual visit interactions. I informed Ms. Deshmukh of possible security and privacy concerns, risks, and limitations associated with providing "not-in-person" medical evaluation and management services. I also informed Ms. Haller  of the availability of "in-person" appointments. Finally, I informed her that there would be a charge for the virtual visit and that she could be  personally, fully or partially, financially responsible for it. Ms. Bankert expressed understanding and agreed to proceed.   Historic Elements   [name] is a 74 y.o. year old, female patient evaluated today after her last contact with our practice on 08/04/2019. [name]  has a past medical history of Allergy, Chronic back pain, Depression, DJD (degenerative joint disease), Hyperlipidemia, Hypertension, Menopause, Musculoskeletal pain (12/07/2015), and TMJ PAIN (05/17/2010). She also  has a past surgical history that includes G3 P3 . Ms. Adriano has a current medication list which includes the following prescription(s): aspirin ec, atorvastatin, calcium carbonate, vitamin d, cyclobenzaprine, gabapentin, hydrochlorothiazide, hydrocodone-acetaminophen, lisinopril, and sertraline. She  reports that she has never smoked. She has never used smokeless tobacco. She reports that she does not drink alcohol or use drugs. Wanda [name] is allergic to simvastatin.   HPI  Today, she is being contacted for medication management. The patient indicates doing well with the current medication regimen. No adverse reactions or side effects reported to the medications.  Today the patient was given the appropriate warning regarding the [facility] problem with analgesic opioid supplies.  She has decided to stay with them for now.  I have given her the appropriate warning about not refilling partial prescriptions or sending prescriptions to other [facility] pharmacies should they not have enough medication.  He understood and accepted.  Pharmacotherapy Assessment  Analgesic: Hydrocodone/APAP 5/325, 1 tab PO q 6 hrs (20 mg/day of hydrocodone) MME/day:20 mg/day.   Monitoring: [place] PMP: PDMP reviewed during this encounter.       Pharmacotherapy: No side-effects or  adverse reactions reported. Compliance: No problems identified.  Effectiveness: Clinically acceptable. Plan: Refer to "POC".  UDS:  Summary  Date Value Ref Range Status  02/13/2019 Note  Final    Comment:    ==================================================================== [facility] Select 13 (MW) ==================================================================== Test                             Result       Flag       Units Drug Present and Declared for Prescription Verification   Hydrocodone                    645          EXPECTED   ng/mg creat   Hydromorphone                  68           EXPECTED   ng/mg creat   Dihydrocodeine                 54           EXPECTED   ng/mg creat   Norhydrocodone                 905          EXPECTED   ng/mg creat    Sources of hydrocodone include scheduled prescription medications.    Hydromorphone, dihydrocodeine and norhydrocodone are expected    metabolites of hydrocodone. Hydromorphone and dihydrocodeine are    also available as scheduled prescription medications. ==================================================================== Test                      Result    Flag   Units      Ref Range   Creatinine              265              mg/dL      >=20 ==================================================================== Declared Medications:  The flagging and interpretation on this report are based on the  following declared medications.  Unexpected results may arise from  inaccuracies in the declared medications.  **Note: The testing scope of this panel includes these medications:  Hydrocodone  **Note: The testing scope of this panel does not include the  following reported medications:  Acetaminophen  Aspirin  Atorvastatin (Lipitor)  Cholecalciferol  Cyclobenzaprine  Gabapentin  Hydrochlorothiazide  Lisinopril  Sertraline ==================================================================== For clinical consultation, please  call [phone]. ====================================================================    Laboratory Chemistry Profile   Renal Lab Results  Component Value Date   BUN 13 02/13/2019   CREATININE 0.67 02/13/2019   BCR 19 02/13/2019   GFR 105.13 12/24/2018   GFRAA 103 02/13/2019   GFRNONAA 89 02/13/2019     Hepatic Lab Results  Component Value Date   AST 18 07/10/2019   ALT 18 07/10/2019   ALBUMIN 4.5 02/13/2019   ALKPHOS 87 02/13/2019   HCVAB NEGATIVE 08/30/2016     Electrolytes Lab Results  Component Value Date   NA 139 02/13/2019   K 4.2 02/13/2019   CL 100 02/13/2019   CALCIUM 9.7 02/13/2019   MG 1.8 02/13/2019     Bone Lab Results  Component Value Date   VD25OH 37 05/01/2011   VD125OH2TOT 68 12/24/2017   JS2831DV7 68 12/24/2017   VD2125OH2 <8 12/24/2017   25OHVITD1 22 (L) 02/13/2019   25OHVITD2 3.4 02/13/2019   25OHVITD3 19 02/13/2019     Inflammation (CRP: Acute  Phase) (ESR: Chronic Phase) Lab Results  Component Value Date   CRP 8 02/13/2019   ESRSEDRATE 47 (H) 02/13/2019       Note: Above Lab results reviewed.  Imaging  MM 3D SCREEN BREAST BILATERAL CLINICAL DATA:  Screening.  EXAM: DIGITAL SCREENING BILATERAL MAMMOGRAM WITH TOMO AND CAD  COMPARISON:  Previous exam(s).  ACR Breast Density Category c: The breast tissue is heterogeneously dense, which may obscure small masses.  FINDINGS: There are no findings suspicious for malignancy. Images were processed with CAD.  IMPRESSION: No mammographic evidence of malignancy. A result letter of this screening mammogram will be mailed directly to the patient.  RECOMMENDATION: Screening mammogram in one year. (Code:[id number])  BI-RADS CATEGORY  1: Negative.  Electronically Signed   By: Dorise Bullion III M.D   On: 08/08/2018 17:24  Assessment  The encounter diagnosis was Chronic pain syndrome.  Plan of Care  Problem-specific:  No problem-specific Assessment & Plan notes found for  this encounter.  Ms. MARKALA SITTS has a current medication list which includes the following long-term medication(s): atorvastatin, calcium carbonate, cyclobenzaprine, gabapentin, hydrochlorothiazide, hydrocodone-acetaminophen, lisinopril, and sertraline.  Pharmacotherapy (Medications Ordered): No orders of the defined types were placed in this encounter.  Orders:  No orders of the defined types were placed in this encounter.  Follow-up plan:   No follow-ups on file.      Considering:  NOTE: NO RFA until BMI <35. Diagnostic/palliative left L4-5 LESI  Diagnostic left L3 TFESI    Palliative PRN treatment(s):  Palliative bilateral lumbar facet block #3  Palliative left IA Hyalgan knee injection #S2/N3 (last series completed 01/15/2018) Palliative left vs bilateral IA knee injection #3 (w/ steroid) (last done 08/15/18)      Recent Visits Date Type Provider Dept  08/04/19 Telemedicine Milinda Pointer, MD [facility]  Showing recent visits within past 90 days and meeting all other requirements   Future Appointments Date Type Provider Dept  10/27/19 Telemedicine Milinda Pointer, MD [facility]  Showing future appointments within next 90 days and meeting all other requirements   I discussed the assessment and treatment plan with the patient. The patient was provided an opportunity to ask questions and all were answered. The patient agreed with the plan and demonstrated an understanding of the instructions.  Patient advised to call back or seek an in-person evaluation if the symptoms or condition worsens.  Duration of encounter: 13 minutes.  Note by: Gaspar Cola, MD Date: 10/27/2019; Time: 9:53 PM

## 2019-10-27 ENCOUNTER — Other Ambulatory Visit: Payer: Self-pay

## 2019-10-27 ENCOUNTER — Ambulatory Visit: Payer: Medicare HMO | Attending: Pain Medicine | Admitting: Pain Medicine

## 2019-10-27 DIAGNOSIS — G8929 Other chronic pain: Secondary | ICD-10-CM | POA: Diagnosis not present

## 2019-10-27 DIAGNOSIS — M25562 Pain in left knee: Secondary | ICD-10-CM | POA: Diagnosis not present

## 2019-10-27 DIAGNOSIS — M5442 Lumbago with sciatica, left side: Secondary | ICD-10-CM

## 2019-10-27 DIAGNOSIS — G894 Chronic pain syndrome: Secondary | ICD-10-CM

## 2019-10-27 DIAGNOSIS — M47816 Spondylosis without myelopathy or radiculopathy, lumbar region: Secondary | ICD-10-CM

## 2019-10-27 DIAGNOSIS — M25561 Pain in right knee: Secondary | ICD-10-CM

## 2019-10-27 DIAGNOSIS — M48061 Spinal stenosis, lumbar region without neurogenic claudication: Secondary | ICD-10-CM | POA: Diagnosis not present

## 2019-10-27 DIAGNOSIS — M4310 Spondylolisthesis, site unspecified: Secondary | ICD-10-CM

## 2019-10-27 MED ORDER — HYDROCODONE-ACETAMINOPHEN 5-325 MG PO TABS: 1.0000 | ORAL_TABLET | Freq: Four times a day (QID) | ORAL | 0 refills | Status: DC | PRN

## 2019-10-27 NOTE — Addendum Note (Signed)
Addended by: Milinda Pointer A on: 10/27/2019 10:38 AM   Modules accepted: Orders

## 2019-10-27 NOTE — Patient Instructions (Signed)
____________________________________________________________________________________________  Preparing for Procedure with Sedation  Procedure appointments are limited to planned procedures: . No Prescription Refills. . No disability issues will be discussed. . No medication changes will be discussed.  Instructions: . Oral Intake: Do not eat or drink anything for at least 3 hours prior to your procedure. (Exception: Blood Pressure Medication. See below.) . Transportation: Unless otherwise stated by your physician, you may drive yourself after the procedure. . Blood Pressure Medicine: Do not forget to take your blood pressure medicine with a sip of water the morning of the procedure. If your Diastolic (lower reading)is above 100 mmHg, elective cases will be cancelled/rescheduled. . Blood thinners: These will need to be stopped for procedures. Notify our staff if you are taking any blood thinners. Depending on which one you take, there will be specific instructions on how and when to stop it. . Diabetics on insulin: Notify the staff so that you can be scheduled 1st case in the morning. If your diabetes requires high dose insulin, take only  of your normal insulin dose the morning of the procedure and notify the staff that you have done so. . Preventing infections: Shower with an antibacterial soap the morning of your procedure. . Build-up your immune system: Take 1000 mg of Vitamin C with every meal (3 times a day) the day prior to your procedure. . Antibiotics: Inform the staff if you have a condition or reason that requires you to take antibiotics before dental procedures. . Pregnancy: If you are pregnant, call and cancel the procedure. . Sickness: If you have a cold, fever, or any active infections, call and cancel the procedure. . Arrival: You must be in the facility at least 30 minutes prior to your scheduled procedure. . Children: Do not bring children with you. . Dress appropriately:  Bring dark clothing that you would not mind if they get stained. . Valuables: Do not bring any jewelry or valuables.  Reasons to call and reschedule or cancel your procedure: (Following these recommendations will minimize the risk of a serious complication.) . Surgeries: Avoid having procedures within 2 weeks of any surgery. (Avoid for 2 weeks before or after any surgery). . Flu Shots: Avoid having procedures within 2 weeks of a flu shots or . (Avoid for 2 weeks before or after immunizations). . Barium: Avoid having a procedure within 7-10 days after having had a radiological study involving the use of radiological contrast. (Myelograms, Barium swallow or enema study). . Heart attacks: Avoid any elective procedures or surgeries for the initial 6 months after a "Myocardial Infarction" (Heart Attack). . Blood thinners: It is imperative that you stop these medications before procedures. Let us know if you if you take any blood thinner.  . Infection: Avoid procedures during or within two weeks of an infection (including chest colds or gastrointestinal problems). Symptoms associated with infections include: Localized redness, fever, chills, night sweats or profuse sweating, burning sensation when voiding, cough, congestion, stuffiness, runny nose, sore throat, diarrhea, nausea, vomiting, cold or Flu symptoms, recent or current infections. It is specially important if the infection is over the area that we intend to treat. . Heart and lung problems: Symptoms that may suggest an active cardiopulmonary problem include: cough, chest pain, breathing difficulties or shortness of breath, dizziness, ankle swelling, uncontrolled high or unusually low blood pressure, and/or palpitations. If you are experiencing any of these symptoms, cancel your procedure and contact your primary care physician for an evaluation.  Remember:  Regular Business hours are:    Monday to Thursday 8:00 AM to 4:00 PM  Provider's  Schedule: Zander Ingham, MD:  Procedure days: Tuesday and Thursday 7:30 AM to 4:00 PM  Bilal Lateef, MD:  Procedure days: Monday and Wednesday 7:30 AM to 4:00 PM ____________________________________________________________________________________________    

## 2019-11-05 ENCOUNTER — Ambulatory Visit: Payer: Medicare HMO | Admitting: Pain Medicine

## 2019-11-18 ENCOUNTER — Ambulatory Visit (HOSPITAL_BASED_OUTPATIENT_CLINIC_OR_DEPARTMENT_OTHER): Payer: Medicare HMO | Admitting: Pain Medicine

## 2019-11-18 ENCOUNTER — Ambulatory Visit
Admission: RE | Admit: 2019-11-18 | Discharge: 2019-11-18 | Disposition: A | Discharge status: Home / Self Care | Payer: Medicare HMO | Source: Ambulatory Visit | Attending: Pain Medicine | Admitting: Pain Medicine

## 2019-11-18 ENCOUNTER — Encounter: Payer: Self-pay | Admitting: Pain Medicine

## 2019-11-18 ENCOUNTER — Other Ambulatory Visit: Payer: Self-pay

## 2019-11-18 VITALS — BP 143/69 | HR 76 | Temp 98.0°F | Resp 16 | Ht 65.0 in | Wt 242.0 lb

## 2019-11-18 DIAGNOSIS — M545 Low back pain, unspecified: Secondary | ICD-10-CM | POA: Insufficient documentation

## 2019-11-18 DIAGNOSIS — M47816 Spondylosis without myelopathy or radiculopathy, lumbar region: Secondary | ICD-10-CM | POA: Diagnosis not present

## 2019-11-18 DIAGNOSIS — M5442 Lumbago with sciatica, left side: Secondary | ICD-10-CM | POA: Diagnosis not present

## 2019-11-18 DIAGNOSIS — G8929 Other chronic pain: Secondary | ICD-10-CM

## 2019-11-18 DIAGNOSIS — M47817 Spondylosis without myelopathy or radiculopathy, lumbosacral region: Secondary | ICD-10-CM | POA: Diagnosis not present

## 2019-11-18 DIAGNOSIS — M4310 Spondylolisthesis, site unspecified: Secondary | ICD-10-CM

## 2019-11-18 DIAGNOSIS — M5137 Other intervertebral disc degeneration, lumbosacral region: Secondary | ICD-10-CM | POA: Diagnosis not present

## 2019-11-18 MED ORDER — FENTANYL CITRATE (PF) 100 MCG/2ML IJ SOLN
25.0000 ug | INTRAMUSCULAR | Status: DC | PRN
  Administered 2019-11-18: 50 ug via INTRAVENOUS
  Filled 2019-11-18: qty 2

## 2019-11-18 MED ORDER — ROPIVACAINE HCL 2 MG/ML IJ SOLN
18.0000 mL | Freq: Once | INTRAMUSCULAR | Status: AC
  Administered 2019-11-18: 11:00:00 18 mL via PERINEURAL
  Filled 2019-11-18: qty 20

## 2019-11-18 MED ORDER — TRIAMCINOLONE ACETONIDE 40 MG/ML IJ SUSP
80.0000 mg | Freq: Once | INTRAMUSCULAR | Status: AC
  Administered 2019-11-18: 11:00:00 80 mg
  Filled 2019-11-18: qty 2

## 2019-11-18 MED ORDER — LIDOCAINE HCL 2 % IJ SOLN
20.0000 mL | Freq: Once | INTRAMUSCULAR | Status: AC
  Administered 2019-11-18: 11:00:00 400 mg
  Filled 2019-11-18: qty 20

## 2019-11-18 MED ORDER — MIDAZOLAM HCL 5 MG/5ML IJ SOLN
1.0000 mg | INTRAMUSCULAR | Status: DC | PRN
  Administered 2019-11-18 (×2): 1 mg via INTRAVENOUS
  Filled 2019-11-18: qty 5

## 2019-11-18 MED ORDER — LACTATED RINGERS IV SOLN
1000.0000 mL | Freq: Once | INTRAVENOUS | Status: AC
  Administered 2019-11-18: 1000 mL via INTRAVENOUS

## 2019-11-18 NOTE — Progress Notes (Signed)
PROVIDER NOTE: Information contained herein reflects review and annotations entered in association with encounter. Interpretation of such information and data should be left to medically-trained personnel. Information provided to patient can be located elsewhere in the medical record under "Patient Instructions". Document created using STT-dictation technology, any transcriptional errors that may result from process are unintentional.    Patient: Wanda [name]  Service Category: Procedure  Provider: Gaspar Cola, MD  DOB: [birth year]  DOS: 11/18/2019  Location: [facility]  MRN: [id number]  Setting: Ambulatory - outpatient  Referring Provider: Milinda Pointer, MD  Type: Established Patient  Specialty: Interventional Pain Management  PCP: Colon Branch, MD   Primary Reason for Visit: Interventional Pain Management Treatment. CC: Back Pain (low)  Procedure:          Anesthesia, Analgesia, Anxiolysis:  Type: Lumbar Facet, Medial Branch Block(s) #3  Primary Purpose: Diagnostic Region: Posterolateral Lumbosacral Spine Level: L2, L3, L4, L5, & S1 Medial Branch Level(s). Injecting these levels blocks the L3-4, L4-5, and L5-S1 lumbar facet joints. Laterality: Bilateral  Type: Moderate (Conscious) Sedation combined with Local Anesthesia Indication(s): Analgesia and Anxiety Route: Intravenous (IV) IV Access: Secured Sedation: Meaningful verbal contact was maintained at all times during the procedure  Local Anesthetic: Lidocaine 1-2%  Position: Prone   Indications: 1. Lumbar facet syndrome (Bilateral) (R>L)   2. Spondylosis without myelopathy or radiculopathy, lumbosacral region   3. Grade 1 (17mm) Anterolisthesis of L3 over L4.   4. DDD (degenerative disc disease), lumbosacral   5. Chronic low back pain (Bilateral) w/o sciatica   6. Lumbar spondylosis    Pain Score: Pre-procedure: 6 /10 Post-procedure: 0-No pain/10   Pre-op Assessment:  Wanda [name] is a 74 y.o.  (year old), female patient, seen today for interventional treatment. She  has a past surgical history that includes G3 P3 . Wanda [name] has a current medication list which includes the following prescription(s): aspirin ec, atorvastatin, calcium carbonate, vitamin d, cyclobenzaprine, gabapentin, hydrochlorothiazide, hydrocodone-acetaminophen, [START ON 12/07/2019] hydrocodone-acetaminophen, [START ON 01/06/2020] hydrocodone-acetaminophen, lisinopril, and sertraline, and the following Facility-Administered Medications: fentanyl and midazolam. Her primarily concern today is the Back Pain (low)  Initial Vital Signs:  Pulse/HCG Rate: 84ECG Heart Rate: 73 Temp: (!) 97.4 F (36.3 C) Resp: 16 BP: 138/87 SpO2: 99 %  BMI: Estimated body mass index is 40.27 kg/m as calculated from the following:   Height as of this encounter: 5\' 5"  (1.651 m).   Weight as of this encounter: 242 lb (109.8 kg).  Risk Assessment: Allergies: Reviewed. She is allergic to simvastatin.  Allergy Precautions: None required Coagulopathies: Reviewed. None identified.  Blood-thinner therapy: None at this time Active Infection(s): Reviewed. None identified. Wanda [name] is afebrile  Site Confirmation: Wanda [name] was asked to confirm the procedure and laterality before marking the site Procedure checklist: Completed Consent: Before the procedure and under the influence of no sedative(s), amnesic(s), or anxiolytics, the patient was informed of the treatment options, risks and possible complications. To fulfill our ethical and legal obligations, as recommended by the American Medical Association's Code of Ethics, I have informed the patient of my clinical impression; the nature and purpose of the treatment or procedure; the risks, benefits, and possible complications of the intervention; the alternatives, including doing nothing; the risk(s) and benefit(s) of the alternative treatment(s) or procedure(s); and the risk(s) and benefit(s) of  doing nothing. The patient was provided information about the general risks and possible complications associated with the procedure. These may include, but are not  limited to: failure to achieve desired goals, infection, bleeding, organ or nerve damage, allergic reactions, paralysis, and death. In addition, the patient was informed of those risks and complications associated to Spine-related procedures, such as failure to decrease pain; infection (i.e.: Meningitis, epidural or intraspinal abscess); bleeding (i.e.: epidural hematoma, subarachnoid hemorrhage, or any other type of intraspinal or peri-dural bleeding); organ or nerve damage (i.e.: Any type of peripheral nerve, nerve root, or spinal cord injury) with subsequent damage to sensory, motor, and/or autonomic systems, resulting in permanent pain, numbness, and/or weakness of one or several areas of the body; allergic reactions; (i.e.: anaphylactic reaction); and/or death. Furthermore, the patient was informed of those risks and complications associated with the medications. These include, but are not limited to: allergic reactions (i.e.: anaphylactic or anaphylactoid reaction(s)); adrenal axis suppression; blood sugar elevation that in diabetics may result in ketoacidosis or comma; water retention that in patients with history of congestive heart failure may result in shortness of breath, pulmonary edema, and decompensation with resultant heart failure; weight gain; swelling or edema; medication-induced neural toxicity; particulate matter embolism and blood vessel occlusion with resultant organ, and/or nervous system infarction; and/or aseptic necrosis of one or more joints. Finally, the patient was informed that Medicine is not an exact science; therefore, there is also the possibility of unforeseen or unpredictable risks and/or possible complications that may result in a catastrophic outcome. The patient indicated having understood very clearly. We have  given the patient no guarantees and we have made no promises. Enough time was given to the patient to ask questions, all of which were answered to the patient's satisfaction. Wanda [name] has indicated that she wanted to continue with the procedure. Attestation: I, the ordering provider, attest that I have discussed with the patient the benefits, risks, side-effects, alternatives, likelihood of achieving goals, and potential problems during recovery for the procedure that I have provided informed consent. Date  Time: 11/18/2019  9:52 AM  Pre-Procedure Preparation:  Monitoring: As per clinic protocol. Respiration, ETCO2, SpO2, BP, heart rate and rhythm monitor placed and checked for adequate function Safety Precautions: Patient was assessed for positional comfort and pressure points before starting the procedure. Time-out: I initiated and conducted the "Time-out" before starting the procedure, as per protocol. The patient was asked to participate by confirming the accuracy of the "Time Out" information. Verification of the correct person, site, and procedure were performed and confirmed by me, the nursing staff, and the patient. "Time-out" conducted as per Joint Commission's Universal Protocol (UP.01.01.01). Time: 1052  Description of Procedure:          Laterality: Bilateral. The procedure was performed in identical fashion on both sides. Levels:  L2, L3, L4, L5, & S1 Medial Branch Level(s) Area Prepped: Posterior Lumbosacral Region DuraPrep (Iodine Povacrylex [0.7% available iodine] and Isopropyl Alcohol, 74% w/w) Safety Precautions: Aspiration looking for blood return was conducted prior to all injections. At no point did we inject any substances, as a needle was being advanced. Before injecting, the patient was told to immediately notify me if she was experiencing any new onset of "ringing in the ears, or metallic taste in the mouth". No attempts were made at seeking any paresthesias. Safe injection  practices and needle disposal techniques used. Medications properly checked for expiration dates. SDV (single dose vial) medications used. After the completion of the procedure, all disposable equipment used was discarded in the proper designated medical waste containers. Local Anesthesia: Protocol guidelines were followed. The patient was positioned over the  fluoroscopy table. The area was prepped in the usual manner. The time-out was completed. The target area was identified using fluoroscopy. A 12-in long, straight, sterile hemostat was used with fluoroscopic guidance to locate the targets for each level blocked. Once located, the skin was marked with an approved surgical skin marker. Once all sites were marked, the skin (epidermis, dermis, and hypodermis), as well as deeper tissues (fat, connective tissue and muscle) were infiltrated with a small amount of a short-acting local anesthetic, loaded on a 10cc syringe with a 25G, 1.5-in  Needle. An appropriate amount of time was allowed for local anesthetics to take effect before proceeding to the next step. Local Anesthetic: Lidocaine 2.0% The unused portion of the local anesthetic was discarded in the proper designated containers. Technical explanation of process:  L2 Medial Branch Nerve Block (MBB): The target area for the L2 medial branch is at the junction of the postero-lateral aspect of the superior articular process and the superior, posterior, and medial edge of the transverse process of L3. Under fluoroscopic guidance, a Quincke needle was inserted until contact was made with os over the superior postero-lateral aspect of the pedicular shadow (target area). After negative aspiration for blood, 0.5 mL of the nerve block solution was injected without difficulty or complication. The needle was removed intact. L3 Medial Branch Nerve Block (MBB): The target area for the L3 medial branch is at the junction of the postero-lateral aspect of the superior  articular process and the superior, posterior, and medial edge of the transverse process of L4. Under fluoroscopic guidance, a Quincke needle was inserted until contact was made with os over the superior postero-lateral aspect of the pedicular shadow (target area). After negative aspiration for blood, 0.5 mL of the nerve block solution was injected without difficulty or complication. The needle was removed intact. L4 Medial Branch Nerve Block (MBB): The target area for the L4 medial branch is at the junction of the postero-lateral aspect of the superior articular process and the superior, posterior, and medial edge of the transverse process of L5. Under fluoroscopic guidance, a Quincke needle was inserted until contact was made with os over the superior postero-lateral aspect of the pedicular shadow (target area). After negative aspiration for blood, 0.5 mL of the nerve block solution was injected without difficulty or complication. The needle was removed intact. L5 Medial Branch Nerve Block (MBB): The target area for the L5 medial branch is at the junction of the postero-lateral aspect of the superior articular process and the superior, posterior, and medial edge of the sacral ala. Under fluoroscopic guidance, a Quincke needle was inserted until contact was made with os over the superior postero-lateral aspect of the pedicular shadow (target area). After negative aspiration for blood, 0.5 mL of the nerve block solution was injected without difficulty or complication. The needle was removed intact. S1 Medial Branch Nerve Block (MBB): The target area for the S1 medial branch is at the posterior and inferior 6 o'[name] position of the L5-S1 facet joint. Under fluoroscopic guidance, the Quincke needle inserted for the L5 MBB was redirected until contact was made with os over the inferior and postero aspect of the sacrum, at the 6 o' [name] position under the L5-S1 facet joint (Target area). After negative aspiration  for blood, 0.5 mL of the nerve block solution was injected without difficulty or complication. The needle was removed intact.  Nerve block solution: 0.2% PF-Ropivacaine + Triamcinolone (40 mg/mL) diluted to a final concentration of 4 mg of  Triamcinolone/mL of Ropivacaine The unused portion of the solution was discarded in the proper designated containers. Procedural Needles: 22-gauge, 3.5-inch, Quincke needles used for all levels.  Once the entire procedure was completed, the treated area was cleaned, making sure to leave some of the prepping solution back to take advantage of its long term bactericidal properties.   Illustration of the posterior view of the lumbar spine and the posterior neural structures. Laminae of L2 through S1 are labeled. DPRL5, dorsal primary ramus of L5; DPRS1, dorsal primary ramus of S1; DPR3, dorsal primary ramus of L3; FJ, facet (zygapophyseal) joint L3-L4; I, inferior articular process of L4; LB1, lateral branch of dorsal primary ramus of L1; IAB, inferior articular branches from L3 medial branch (supplies L4-L5 facet joint); IBP, intermediate branch plexus; MB3, medial branch of dorsal primary ramus of L3; NR3, third lumbar nerve root; S, superior articular process of L5; SAB, superior articular branches from L4 (supplies L4-5 facet joint also); TP3, transverse process of L3.  Vitals:   11/18/19 1105 11/18/19 1111 11/18/19 1121 11/18/19 1131  BP: 135/89 (!) 142/84 (!) 143/88 (!) 143/69  Pulse: 76     Resp: 12 16 16 16   Temp:  97.6 F (36.4 C)  98 F (36.7 C)  SpO2: 96% 100% 100% 99%  Weight:      Height:         Start Time: 1052 hrs. End Time: 1103 hrs.  Imaging Guidance (Spinal):          Type of Imaging Technique: Fluoroscopy Guidance (Spinal) Indication(s): Assistance in needle guidance and placement for procedures requiring needle placement in or near specific anatomical locations not easily accessible without such assistance. Exposure Time: Please see  nurses notes. Contrast: None used. Fluoroscopic Guidance: I was personally present during the use of fluoroscopy. "Tunnel Vision Technique" used to obtain the best possible view of the target area. Parallax error corrected before commencing the procedure. "Direction-depth-direction" technique used to introduce the needle under continuous pulsed fluoroscopy. Once target was reached, antero-posterior, oblique, and lateral fluoroscopic projection used confirm needle placement in all planes. Images permanently stored in EMR. Interpretation: No contrast injected. I personally interpreted the imaging intraoperatively. Adequate needle placement confirmed in multiple planes. Permanent images saved into the patient's record.  Antibiotic Prophylaxis:   Anti-infectives (From admission, onward)   None     Indication(s): None identified  Post-operative Assessment:  Post-procedure Vital Signs:  Pulse/HCG Rate: 7667 Temp: 98 F (36.7 C) Resp: 16 BP: (!) 143/69 SpO2: 99 %  EBL: None  Complications: No immediate post-treatment complications observed by team, or reported by patient.  Note: The patient tolerated the entire procedure well. A repeat set of vitals were taken after the procedure and the patient was kept under observation following institutional policy, for this type of procedure. Post-procedural neurological assessment was performed, showing return to baseline, prior to discharge. The patient was provided with post-procedure discharge instructions, including a section on how to identify potential problems. Should any problems arise concerning this procedure, the patient was given instructions to immediately contact us, at any time, without hesitation. In any case, we plan to contact the patient by telephone for a follow-up status report regarding this interventional procedure.  Comments:  No additional relevant information.  Plan of Care  Orders:  Orders Placed This Encounter  Procedures   . LUMBAR FACET(MEDIAL BRANCH NERVE BLOCK) MBNB    Scheduling Instructions:     Procedure: Lumbar facet block (AKA.: Lumbosacral medial branch nerve block)  Side: Bilateral     Level: L3-4, L4-5, & L5-S1 Facets (L2, L3, L4, L5, & S1 Medial Branch Nerves)     Sedation: Patient's choice.     Timeframe: Today    Order Specific Question:   Where will this procedure be performed?    Answer:   [facility]  . DG [facility] C-ARM 1-60 MIN NO REPORT    Intraoperative interpretation by procedural physician at [place].    Standing Status:   Standing    Number of Occurrences:   1    Order Specific Question:   Reason for exam:    Answer:   Assistance in needle guidance and placement for procedures requiring needle placement in or near specific anatomical locations not easily accessible without such assistance.  . Informed Consent Details: Physician/Practitioner Attestation; Transcribe to consent form and obtain patient signature    Nursing Order: Transcribe to consent form and obtain patient signature. Note: Always confirm laterality of pain with Ms. Bergen, before procedure. Procedure: Lumbar Facet Block  under fluoroscopic guidance Indication/Reason: Low Back Pain, with our without leg pain, due to Facet Joint Arthralgia (Joint Pain) known as Lumbar Facet Syndrome, secondary to Lumbar, and/or Lumbosacral Spondylosis (Arthritis of the Spine), without myelopathy or radiculopathy (Nerve Damage). Provider Attestation: I, [place] D[place] Arbour, MD, (Pain Management Specialist), the physician/practitioner, attest that I have discussed with the patient the benefits, risks, side effects, alternatives, likelihood of achieving goals and potential problems during recovery for the procedure that I have provided informed consent.  . Provide equipment / supplies at bedside    Equipment required: Single use, disposable, "Block Tray"    Standing Status:   Standing    Number of Occurrences:    1    Order Specific Question:   Specify    Answer:   Block Tray   Chronic Opioid Analgesic:  Hydrocodone/APAP 5/325, 1 tab PO q 6 hrs (20 mg/day of hydrocodone) MME/day:20 mg/day.   Medications ordered for procedure: Meds ordered this encounter  Medications  . lidocaine (XYLOCAINE) 2 % (with pres) injection 400 mg  . lactated ringers infusion 1,000 mL  . midazolam (VERSED) 5 MG/5ML injection 1-2 mg    Make sure Flumazenil is available in the pyxis when using this medication. If oversedation occurs, administer 0.2 mg IV over 15 sec. If after 45 sec no response, administer 0.2 mg again over 1 min; may repeat at 1 min intervals; not to exceed 4 doses (1 mg)  . fentaNYL (SUBLIMAZE) injection 25-50 mcg    Make sure Narcan is available in the pyxis when using this medication. In the event of respiratory depression (RR< 8/min): Titrate NARCAN (naloxone) in increments of 0.1 to 0.2 mg IV at 2-3 minute intervals, until desired degree of reversal.  . ropivacaine (PF) 2 mg/mL (0.2%) (NAROPIN) injection 18 mL  . triamcinolone acetonide (KENALOG-40) injection 80 mg   Medications administered: We administered lidocaine, lactated ringers, midazolam, fentaNYL, ropivacaine (PF) 2 mg/mL (0.2%), and triamcinolone acetonide.  See the medical record for exact dosing, route, and time of administration.  Follow-up plan:   Return in about 2 weeks (around 12/02/2019) for (PP), (VV).       Considering:  NOTE: NO RFA until BMI <35. Diagnostic/palliative left L4-5 LESI  Diagnostic left L3 TFESI    Palliative PRN treatment(s):  Palliative bilateral lumbar facet block #3  Palliative left IA Hyalgan knee injection #S2/N3 (last series completed 01/15/2018) Palliative left vs bilateral IA knee injection #3 (w/  steroid) (last done 08/15/18)     Recent Visits Date Type Provider Dept  10/27/19 Telemedicine Milinda Pointer, MD [facility]  Showing recent visits within past 90 days and meeting all  other requirements   Today's Visits Date Type Provider Dept  11/18/19 Procedure visit Milinda Pointer, MD [facility]  Showing today's visits and meeting all other requirements   Future Appointments Date Type Provider Dept  12/03/19 Appointment Milinda Pointer, [facility]  01/28/20 Appointment Milinda Pointer, MD [facility]  Showing future appointments within next 90 days and meeting all other requirements   Disposition: Discharge home  Discharge (Date  Time): 11/18/2019; 1133 hrs.   Primary Care Physician: Colon Branch, MD Location: [facility] Note by: Gaspar Cola, MD Date: 11/18/2019; Time: 11:43 AM  Disclaimer:  Medicine is not an Chief Strategy Officer. The only guarantee in medicine is that nothing is guaranteed. It is important to note that the decision to proceed with this intervention was based on the information collected from the patient. The Data and conclusions were drawn from the patient's questionnaire, the interview, and the physical examination. Because the information was provided in large part by the patient, it cannot be guaranteed that it has not been purposely or unconsciously manipulated. Every effort has been made to obtain as much relevant data as possible for this evaluation. It is important to note that the conclusions that lead to this procedure are derived in large part from the available data. Always take into account that the treatment will also be dependent on availability of resources and existing treatment guidelines, considered by other Pain Management Practitioners as being common knowledge and practice, at the time of the intervention. For Medico-Legal purposes, it is also important to point out that variation in procedural techniques and pharmacological choices are the acceptable norm. The indications, contraindications, technique, and results of the above procedure should only be  interpreted and judged by a Board-Certified Interventional Pain Specialist with extensive familiarity and expertise in the same exact procedure and technique.

## 2019-11-18 NOTE — Progress Notes (Signed)
Safety precautions to be maintained throughout the outpatient stay will include: orient to surroundings, keep bed in low position, maintain call bell within reach at all times, provide assistance with transfer out of bed and ambulation.  

## 2019-11-18 NOTE — Patient Instructions (Signed)

## 2019-11-19 ENCOUNTER — Telehealth: Payer: Self-pay

## 2019-11-19 NOTE — Telephone Encounter (Signed)
Post procedure phone call.  Patient states she is doing better.  

## 2019-12-01 NOTE — Progress Notes (Signed)
Patient: Wanda [name]  Service Category: E/M  Provider: Gaspar Cola, MD  DOB: 08-16-1945  DOS: 12/03/2019  Location: Office  MRN: [id number]  Setting: Ambulatory outpatient  Referring Provider: Colon Branch, MD  Type: Established Patient  Specialty: Interventional Pain Management  PCP: Colon Branch, MD  Location: Remote location  Delivery: TeleHealth     Virtual Encounter - Pain Management PROVIDER NOTE: Information contained herein reflects review and annotations entered in association with encounter. Interpretation of such information and data should be left to medically-trained personnel. Information provided to patient can be located elsewhere in the medical record under "Patient Instructions". Document created using STT-dictation technology, any transcriptional errors that may result from process are unintentional.    Contact & Pharmacy Preferred: [phone] Home: [phone] (home) Mobile: [phone] (mobile) E-mail: No e-mail address on record  [facility] [phone] [place], [place] [address] [place] [address] Phone: [phone] Fax: [phone]   Pre-screening  Wanda [name] offered "in-person" vs "virtual" encounter. She indicated preferring virtual for this encounter.   Reason COVID-19*  Social distancing based on CDC and AMA recommendations.   I contacted Minervia Osso Poppell on 12/03/2019 via telephone.      I clearly identified myself as Gaspar Cola, MD. I verified that I was speaking with the correct person using two identifiers (Name: MARLEAH BEEVER, and date of birth: 1946/06/29).  Consent I sought verbal advanced consent from Wanda [name] for virtual visit interactions. I informed Wanda [name] of possible security and privacy concerns, risks, and limitations associated with providing "not-in-person" medical evaluation and management services. I also informed Wanda [name]  of the availability of "in-person" appointments. Finally, I informed her that there would be a charge for the virtual visit and that she could be  personally, fully or partially, financially responsible for it. Wanda [name] expressed understanding and agreed to proceed.   Historic Elements   Wanda [name] is a 74 y.o. year old, female patient evaluated today after her last contact with our practice on 11/19/2019. Wanda [name]  has a past medical history of Allergy, Chronic back pain, Depression, DJD (degenerative joint disease), Hyperlipidemia, Hypertension, Menopause, Musculoskeletal pain (12/07/2015), and TMJ PAIN (05/17/2010). She also  has a past surgical history that includes G3 P3 . Wanda [name] has a current medication list which includes the following prescription(s): aspirin ec, atorvastatin, calcium carbonate, vitamin d, cyclobenzaprine, gabapentin, hydrochlorothiazide, hydrocodone-acetaminophen, [START ON 12/07/2019] hydrocodone-acetaminophen, [START ON 01/06/2020] hydrocodone-acetaminophen, lisinopril, and sertraline. She  reports that she has never smoked. She has never used smokeless tobacco. She reports that she does not drink alcohol or use drugs. Wanda [name] is allergic to simvastatin.   HPI  Today, she is being contacted for a post-procedure assessment.  The patient indicates doing extremely well after her last bilateral lumbar facet block.  This again confirms that this is where the pain is coming from.  Today I have given her some pointers as to the things that she needs to do to improve her situation.  I have recommended that she lose some weight as well as avoid any lifting and twisting at the level of the waist while carrying any heavy material.  She understood and accepted.  Today I also took the time to explain to the patient the problem that we have been experiencing with the [facility] pharmacies not having enough opioid analgesics supplies for the community and how she should  consider switching to a different pharmacy.  Because she already has enough refills to last until 02/05/2020, she will start looking for a different pharmacy and let us know on her next refill.  Post-Procedure Evaluation  Procedure: Diagnostic bilateral lumbar facet block #3 under fluoroscopic guidance and IV sedation Pre-procedure pain level: 6/10 Post-procedure: 0/10 (100% relief)  Sedation: Sedation provided.  Effectiveness during initial hour after procedure(Ultra-Short Term Relief): 100 %  Local anesthetic used: Long-acting (4-6 hours) Effectiveness: Defined as any analgesic benefit obtained secondary to the administration of local anesthetics. This carries significant diagnostic value as to the etiological location, or anatomical origin, of the pain. Duration of benefit is expected to coincide with the duration of the local anesthetic used.  Effectiveness during initial 4-6 hours after procedure(Short-Term Relief): 100 %  Long-term benefit: Defined as any relief past the pharmacologic duration of the local anesthetics.  Effectiveness past the initial 6 hours after procedure(Long-Term Relief): 75 %(patient states pain relief is much better.  She states she is taking less pain medication and has better ROM and better mobility.)  Current benefits: Defined as benefit that persist at this time.   Analgesia:  >75% relief Function: Wanda [name] reports improvement in function ROM: Wanda [name] reports improvement in ROM  Pharmacotherapy Assessment  Analgesic: Hydrocodone/APAP 5/325, 1 tab PO q 6 hrs (20 mg/day of hydrocodone) MME/day:20 mg/day.   Monitoring: [place] PMP: PDMP reviewed during this encounter.       Pharmacotherapy: No side-effects or adverse reactions reported. Compliance: No problems identified. Effectiveness: Clinically acceptable. Plan: Refer to "POC".  UDS:  Summary  Date Value Ref Range Status  02/13/2019 Note  Final    Comment:     ==================================================================== [employer] Select 13 (MW) ==================================================================== Test                             Result       Flag       Units Drug Present and Declared for Prescription Verification   Hydrocodone                    645          EXPECTED   ng/mg creat   Hydromorphone                  68           EXPECTED   ng/mg creat   Dihydrocodeine                 54           EXPECTED   ng/mg creat   Norhydrocodone                 905          EXPECTED   ng/mg creat    Sources of hydrocodone include scheduled prescription medications.    Hydromorphone, dihydrocodeine and norhydrocodone are expected    metabolites of hydrocodone. Hydromorphone and dihydrocodeine are    also available as scheduled prescription medications. ==================================================================== Test                      Result    Flag   Units      Ref Range   Creatinine              265              mg/dL      >=  20 ==================================================================== Declared Medications:  The flagging and interpretation on this report are based on the  following declared medications.  Unexpected results may arise from  inaccuracies in the declared medications.  **Note: The testing scope of this panel includes these medications:  Hydrocodone  **Note: The testing scope of this panel does not include the  following reported medications:  Acetaminophen  Aspirin  Atorvastatin (Lipitor)  Cholecalciferol  Cyclobenzaprine  Gabapentin  Hydrochlorothiazide  Lisinopril  Sertraline ==================================================================== For clinical consultation, please call [phone]. ====================================================================    Laboratory Chemistry Profile   Renal Lab Results  Component Value Date   BUN 13 02/13/2019   CREATININE 0.67  02/13/2019   BCR 19 02/13/2019   GFR 105.13 12/24/2018   GFRAA 103 02/13/2019   GFRNONAA 89 02/13/2019     Hepatic Lab Results  Component Value Date   AST 18 07/10/2019   ALT 18 07/10/2019   ALBUMIN 4.5 02/13/2019   ALKPHOS 87 02/13/2019   HCVAB NEGATIVE 08/30/2016     Electrolytes Lab Results  Component Value Date   NA 139 02/13/2019   K 4.2 02/13/2019   CL 100 02/13/2019   CALCIUM 9.7 02/13/2019   MG 1.8 02/13/2019     Bone Lab Results  Component Value Date   VD25OH 37 05/01/2011   VD125OH2TOT 68 12/24/2017   XB1478GN5 68 12/24/2017   VD2125OH2 <8 12/24/2017   25OHVITD1 22 (L) 02/13/2019   25OHVITD2 3.4 02/13/2019   25OHVITD3 19 02/13/2019     Inflammation (CRP: Acute Phase) (ESR: Chronic Phase) Lab Results  Component Value Date   CRP 8 02/13/2019   ESRSEDRATE 47 (H) 02/13/2019       Note: Above Lab results reviewed.  Imaging  DG PAIN CLINIC C-ARM 1-60 MIN NO REPORT Fluoro was used, but no Radiologist interpretation will be provided.  Please refer to "NOTES" tab for provider progress note.  Assessment  The primary encounter diagnosis was Chronic pain syndrome. 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  Plan of Care  Problem-specific:  No problem-specific Assessment & Plan notes found for this encounter.  Ms. SGWENEVERE GOGAhas a current medication list which includes the following long-term medication(s): atorvastatin, calcium carbonate, cyclobenzaprine, gabapentin, hydrochlorothiazide, hydrocodone-acetaminophen, [START ON 12/07/2019] hydrocodone-acetaminophen, [START ON 01/06/2020] hydrocodone-acetaminophen, lisinopril, and sertraline.  Pharmacotherapy (Medications Ordered): No orders of the defined types  were placed in this encounter.  Orders:  No orders of the defined types were placed in this encounter.  Follow-up plan:   Return in about 9 weeks (around 02/04/2020) for (F2F), (MM) pending change of pharmacy.      Considering:  NOTE: NO RFA until BMI <35. Diagnostic/palliative left L4-5 LESI  Diagnostic left L3 TFESI    Palliative PRN treatment(s):  Palliative bilateral lumbar facet block #3  Palliative left IA Hyalgan knee injection #S2/N3 (last series completed 01/15/2018) Palliative left vs bilateral IA knee injection #3 (w/ steroid) (last done 08/15/18)      Recent Visits Date Type Provider Dept  11/18/19 Procedure visit NMilinda Pointer MD [facility]  10/27/19 Telemedicine NMilinda Pointer MD [facility]  Showing recent visits within past 90 days and meeting all other requirements   Today's Visits Date Type Provider Dept  12/03/19 Telemedicine NMilinda Pointer MD [facility]  Showing today's  visits and meeting all other requirements   Future Appointments Date Type Provider Dept  01/28/20 Appointment Milinda Pointer, MD [facility]  Showing future appointments within next 90 days and meeting all other requirements   I discussed the assessment and treatment plan with the patient. The patient was provided an opportunity to ask questions and all were answered. The patient agreed with the plan and demonstrated an understanding of the instructions.  Patient advised to call back or seek an in-person evaluation if the symptoms or condition worsens.  Duration of encounter: 12 minutes.  Note by: Gaspar Cola, MD Date: 12/03/2019; Time: 12:23 PM

## 2019-12-02 ENCOUNTER — Encounter: Payer: Self-pay | Admitting: Pain Medicine

## 2019-12-03 ENCOUNTER — Ambulatory Visit: Payer: Medicare HMO | Attending: Pain Medicine | Admitting: Pain Medicine

## 2019-12-03 ENCOUNTER — Other Ambulatory Visit: Payer: Self-pay

## 2019-12-03 DIAGNOSIS — G8929 Other chronic pain: Secondary | ICD-10-CM | POA: Diagnosis not present

## 2019-12-03 DIAGNOSIS — M5137 Other intervertebral disc degeneration, lumbosacral region: Secondary | ICD-10-CM

## 2019-12-03 DIAGNOSIS — G894 Chronic pain syndrome: Secondary | ICD-10-CM | POA: Diagnosis not present

## 2019-12-03 DIAGNOSIS — M545 Low back pain: Secondary | ICD-10-CM | POA: Diagnosis not present

## 2019-12-03 DIAGNOSIS — M4310 Spondylolisthesis, site unspecified: Secondary | ICD-10-CM

## 2019-12-03 DIAGNOSIS — M47816 Spondylosis without myelopathy or radiculopathy, lumbar region: Secondary | ICD-10-CM

## 2019-12-03 DIAGNOSIS — M25562 Pain in left knee: Secondary | ICD-10-CM

## 2019-12-03 DIAGNOSIS — M25561 Pain in right knee: Secondary | ICD-10-CM | POA: Diagnosis not present

## 2019-12-15 ENCOUNTER — Other Ambulatory Visit: Payer: Self-pay | Admitting: Internal Medicine

## 2020-01-16 ENCOUNTER — Encounter: Payer: Self-pay | Admitting: Internal Medicine

## 2020-01-16 ENCOUNTER — Ambulatory Visit (INDEPENDENT_AMBULATORY_CARE_PROVIDER_SITE_OTHER): Payer: Medicare HMO | Admitting: Internal Medicine

## 2020-01-16 ENCOUNTER — Other Ambulatory Visit: Payer: Self-pay

## 2020-01-16 VITALS — BP 128/82 | HR 72 | Temp 97.0°F | Resp 18 | Ht 65.0 in | Wt 239.2 lb

## 2020-01-16 DIAGNOSIS — M160 Bilateral primary osteoarthritis of hip: Secondary | ICD-10-CM

## 2020-01-16 DIAGNOSIS — Z0000 Encounter for general adult medical examination without abnormal findings: Secondary | ICD-10-CM | POA: Diagnosis not present

## 2020-01-16 DIAGNOSIS — F32A Depression, unspecified: Secondary | ICD-10-CM

## 2020-01-16 DIAGNOSIS — Z01419 Encounter for gynecological examination (general) (routine) without abnormal findings: Secondary | ICD-10-CM

## 2020-01-16 DIAGNOSIS — F329 Major depressive disorder, single episode, unspecified: Secondary | ICD-10-CM

## 2020-01-16 DIAGNOSIS — I10 Essential (primary) hypertension: Secondary | ICD-10-CM

## 2020-01-16 DIAGNOSIS — E559 Vitamin D deficiency, unspecified: Secondary | ICD-10-CM | POA: Diagnosis not present

## 2020-01-16 DIAGNOSIS — M791 Myalgia, unspecified site: Secondary | ICD-10-CM | POA: Diagnosis not present

## 2020-01-16 DIAGNOSIS — E785 Hyperlipidemia, unspecified: Secondary | ICD-10-CM | POA: Diagnosis not present

## 2020-01-16 DIAGNOSIS — M170 Bilateral primary osteoarthritis of knee: Secondary | ICD-10-CM | POA: Diagnosis not present

## 2020-01-16 DIAGNOSIS — Z1211 Encounter for screening for malignant neoplasm of colon: Secondary | ICD-10-CM

## 2020-01-16 NOTE — Patient Instructions (Addendum)
Please schedule Medicare Wellness with Glenard Haring.   Check your blood pressure monthly BP GOAL is between 110/65 and  135/85. If it is consistently higher or lower, let me know  Reviewed mammogram  Referrals: Gynecology Gastroenterology, colonoscopy   GO TO THE LAB : Get the blood work     [place], [place] back for a checkup in 6 months

## 2020-01-16 NOTE — Progress Notes (Signed)
Pre visit review using our clinic review tool, if applicable. No additional management support is needed unless otherwise documented below in the visit note. 

## 2020-01-16 NOTE — Progress Notes (Signed)
Subjective:    Patient ID: Wanda [name], female    DOB: 1946/03/31, 74 y.o.   MRN: [id number]  DOS:  01/16/2020 Type of visit - description: Here for CPX Her main concern continues to be pain, under pain management. She wonders about fibromyalgia because she has aches and pains at the lower extremities (in addition to arthralgias).  Also wonders if that could be related to Lipitor.  Review of Systems PHQ-9 scored 9 however she overall feels okay emotionally.  Other than above, a 14 point review of systems is negative   Past Medical History:  Diagnosis Date  . Allergy    RHINITIS  . Chronic back pain   . Depression   . DJD (degenerative joint disease)    back pain , on disability  . Hyperlipidemia   . Hypertension   . Menopause    onset age 45   . Musculoskeletal pain 12/07/2015  . TMJ PAIN 05/17/2010   Qualifier: Diagnosis of  By: Larose Kells MD, [place]     Past Surgical History:  Procedure Laterality Date  . G3 P3       Allergies as of 01/16/2020      Reactions   Simvastatin    REACTION: cramps      Medication List       Accurate as of January 16, 2020  1:45 PM. If you have any questions, ask your nurse or doctor.        aspirin EC 81 MG tablet Take 81 mg by mouth daily.   atorvastatin 40 MG tablet Commonly known as: LIPITOR Take 1 tablet (40 mg total) by mouth at bedtime.   calcium carbonate 600 MG Tabs tablet Commonly known as: Calcium 600 Take 1 tablet (600 mg total) by mouth 2 (two) times daily with a meal.   cyclobenzaprine 10 MG tablet Commonly known as: FLEXERIL Take 1 tablet (10 mg total) by mouth at bedtime.   gabapentin 300 MG capsule Commonly known as: NEURONTIN Take 1 capsule (300 mg total) by mouth 3 (three) times daily.   hydrochlorothiazide 25 MG tablet Commonly known as: HYDRODIURIL Take 1 tablet (25 mg total) by mouth daily.   HYDROcodone-acetaminophen 5-325 MG tablet Commonly known as: NORCO/VICODIN Take 1 tablet by mouth every 6  (six) hours as needed for severe pain. Must last 30 days   HYDROcodone-acetaminophen 5-325 MG tablet Commonly known as: NORCO/VICODIN Take 1 tablet by mouth every 6 (six) hours as needed for severe pain. Must last 30 days   HYDROcodone-acetaminophen 5-325 MG tablet Commonly known as: NORCO/VICODIN Take 1 tablet by mouth every 6 (six) hours as needed for severe pain. Must last 30 days   lisinopril 10 MG tablet Commonly known as: ZESTRIL Take 1 tablet (10 mg total) by mouth daily.   sertraline 50 MG tablet Commonly known as: ZOLOFT Take 3 tablets (150 mg total) by mouth daily.   Vitamin D 50 MCG (2000 UT) tablet Take 2,000 Units by mouth daily.          Objective:   Physical Exam BP 128/82 (BP Location: Left Arm, Patient Position: Sitting, Cuff Size: Normal)   Pulse 72   Temp (!) 97 F (36.1 C) (Temporal)   Resp 18   Ht 5\' 5"  (1.651 m)   Wt 239 lb 4 oz (108.5 kg)   SpO2 97%   BMI 39.81 kg/m  General: Well developed, NAD, BMI noted Neck: No  thyromegaly  HEENT:  Normocephalic . Face symmetric, atraumatic Lungs:  CTA B Normal respiratory effort, no intercostal retractions, no accessory muscle use. Heart: RRR,  no murmur.  Abdomen:  Not distended, soft, non-tender. No rebound or rigidity.   Lower extremities: no pretibial edema bilaterally  Skin: Exposed areas without rash. Not pale. Not jaundice Neurologic:  alert & oriented X3.  Speech normal, gait and transferring quite limited due to BMI and DJD Strength symmetric and appropriate for age.  Psych: Cognition and judgment appear intact.  Cooperative with normal attention span and concentration.  Behavior appropriate. No anxious or depressed appearing.     Assessment     Assessment HTN Hyperlipidemia Depression Morbid obesity MSK: DJD, chronic back-knee pain, hydrocodone /gabapentin rx by DR Shelly Rubenstein D  Def  PLAN:  HTN: Seems controlled, continue HCTZ, lisinopril, check labs. High cholesterol:  Good compliance with Lipitor every day, check FLP.  See next DJD, myalgias: Follow-up by pain management, she has cramps, aches and pains in both legs and she wonders about the being related to FM or a side effect from statins.  That is very hard to define because her chronic pain.  We will check a sed rate, total CK for completeness.  If so desired she could hold Lipitor for a month and see what is the effect of that over her pain.  She understood this "trial". Depression: Controlled, on Zoloft. Vitamin D deficiency: On OTCs, checking levels. RTC 6 months    This visit occurred during the SARS-CoV-2 public health emergency.  Safety protocols were in place, including screening questions prior to the visit, additional usage of staff PPE, and extensive cleaning of exam room while observing appropriate contact time as indicated for disinfecting solutions.

## 2020-01-17 ENCOUNTER — Encounter: Payer: Self-pay | Admitting: Internal Medicine

## 2020-01-17 LAB — CBC WITH DIFFERENTIAL/PLATELET
Absolute Monocytes: 395 cells/uL (ref 200–950)
Basophils Absolute: 29 cells/uL (ref 0–200)
Basophils Relative: 0.7 %
Eosinophils Absolute: 80 cells/uL (ref 15–500)
Eosinophils Relative: 1.9 %
HCT: 41.4 % (ref 35.0–45.0)
Hemoglobin: 13.6 g/dL (ref 11.7–15.5)
Lymphs Abs: 1365 cells/uL (ref 850–3900)
MCH: 31.1 pg (ref 27.0–33.0)
MCHC: 32.9 g/dL (ref 32.0–36.0)
MCV: 94.7 fL (ref 80.0–100.0)
MPV: 11.2 fL (ref 7.5–12.5)
Monocytes Relative: 9.4 %
Neutro Abs: 2331 cells/uL (ref 1500–7800)
Neutrophils Relative %: 55.5 %
Platelets: 173 10*3/uL (ref 140–400)
RBC: 4.37 10*6/uL (ref 3.80–5.10)
RDW: 14.3 % (ref 11.0–15.0)
Total Lymphocyte: 32.5 %
WBC: 4.2 10*3/uL (ref 3.8–10.8)

## 2020-01-17 LAB — COMPREHENSIVE METABOLIC PANEL
AG Ratio: 1.6 (calc) (ref 1.0–2.5)
ALT: 22 U/L (ref 6–29)
AST: 19 U/L (ref 10–35)
Albumin: 4.1 g/dL (ref 3.6–5.1)
Alkaline phosphatase (APISO): 76 U/L (ref 37–153)
BUN: 10 mg/dL (ref 7–25)
CO2: 26 mmol/L (ref 20–32)
Calcium: 9.8 mg/dL (ref 8.6–10.4)
Chloride: 103 mmol/L (ref 98–110)
Creat: 0.66 mg/dL (ref 0.60–0.93)
Globulin: 2.6 g/dL (calc) (ref 1.9–3.7)
Glucose, Bld: 92 mg/dL (ref 65–99)
Potassium: 4.3 mmol/L (ref 3.5–5.3)
Sodium: 138 mmol/L (ref 135–146)
Total Bilirubin: 0.5 mg/dL (ref 0.2–1.2)
Total Protein: 6.7 g/dL (ref 6.1–8.1)

## 2020-01-17 LAB — LIPID PANEL
Cholesterol: 159 mg/dL (ref <200)
HDL: 57 mg/dL (ref >=50)
LDL Cholesterol (Calc): 87 mg/dL (calc)
Non-HDL Cholesterol (Calc): 102 mg/dL (calc) (ref <130)
Total CHOL/HDL Ratio: 2.8 (calc) (ref <5.0)
Triglycerides: 65 mg/dL (ref <150)

## 2020-01-17 LAB — VITAMIN D 25 HYDROXY (VIT D DEFICIENCY, FRACTURES): Vit D, 25-Hydroxy: 25 ng/mL — ABNORMAL LOW (ref 30–100)

## 2020-01-17 LAB — SEDIMENTATION RATE: Sed Rate: 22 mm/h (ref 0–30)

## 2020-01-17 LAB — CK: Total CK: 142 U/L (ref 29–143)

## 2020-01-17 NOTE — Assessment & Plan Note (Signed)
HTN: Seems controlled, continue HCTZ, lisinopril, check labs. High cholesterol: Good compliance with Lipitor every day, check FLP.  See next DJD, myalgias: Follow-up by pain management, she has cramps, aches and pains in both legs and she wonders about the being related to FM or a side effect from statins.  That is very hard to define because her chronic pain.  We will check a sed rate, total CK for completeness.  If so desired she could hold Lipitor for a month and see what is the effect of that over her pain.  She understood this "trial". Depression: Controlled, on Zoloft. Vitamin D deficiency: On OTCs, checking levels. RTC 6 months

## 2020-01-17 NOTE — Assessment & Plan Note (Signed)
-   Td 2019 -PNM 23 2016 -PNM 13 2015 - zostavax: 2014 -Shingrix: discussed before -Covid vaccination: Completed - flu shot q year recommended -Female care:  Last PAP 2017,  Dr Deatra Ina retired: refer to gyn [facility] 07/2018 per [place], encouraged to call and make her own appointment -CCS: cscope 2014, due for a repeat cscope, refer to GI. -DEXA  2012: Normal (and  improved compared to 2009 per report).    Recheck a DEXA next year, in the near future she will get busy with C-scope, MMG, gynecology. -Labs:  CMP, FLP, CBC, vitamin D, sed rate, total CK -Diet: Discussed

## 2020-01-21 MED ORDER — VITAMIN D (ERGOCALCIFEROL) 1.25 MG (50000 UNIT) PO CAPS
50000.0000 [IU] | ORAL_CAPSULE | ORAL | 0 refills | Status: DC
Start: 2020-01-21 — End: 2021-03-01

## 2020-01-21 NOTE — Addendum Note (Signed)
Addended byDamita Dunnings D on: 01/21/2020 01:10 PM   Modules accepted: Orders

## 2020-01-28 ENCOUNTER — Encounter: Payer: Medicare HMO | Admitting: Pain Medicine

## 2020-02-03 ENCOUNTER — Telehealth: Payer: Self-pay | Admitting: Pain Medicine

## 2020-02-03 NOTE — Telephone Encounter (Signed)
LM for patient to call office to go over virtual appointment questions.  ?

## 2020-02-03 NOTE — Progress Notes (Deleted)
Did not answer prescreening call.

## 2020-02-03 NOTE — Telephone Encounter (Signed)
Patient called with new Pharmacy Info, she has appt 7:45 tomorrow morning VV  [facility]   Store # [phone] [address] [place], [place] [address] [phone]  Please change asap so scripts get sent to correct place

## 2020-02-04 ENCOUNTER — Ambulatory Visit: Payer: Medicare HMO | Attending: Pain Medicine | Admitting: Pain Medicine

## 2020-02-04 ENCOUNTER — Ambulatory Visit: Payer: Medicare HMO | Admitting: Pain Medicine

## 2020-02-04 ENCOUNTER — Other Ambulatory Visit: Payer: Self-pay

## 2020-02-04 ENCOUNTER — Encounter: Payer: Self-pay | Admitting: Pain Medicine

## 2020-02-04 VITALS — BP 129/81 | HR 76 | Resp 18 | Ht 62.0 in | Wt 236.0 lb

## 2020-02-04 DIAGNOSIS — M5137 Other intervertebral disc degeneration, lumbosacral region: Secondary | ICD-10-CM | POA: Insufficient documentation

## 2020-02-04 DIAGNOSIS — G8929 Other chronic pain: Secondary | ICD-10-CM | POA: Diagnosis not present

## 2020-02-04 DIAGNOSIS — M792 Neuralgia and neuritis, unspecified: Secondary | ICD-10-CM | POA: Diagnosis not present

## 2020-02-04 DIAGNOSIS — M7918 Myalgia, other site: Secondary | ICD-10-CM | POA: Diagnosis not present

## 2020-02-04 DIAGNOSIS — M545 Low back pain, unspecified: Secondary | ICD-10-CM | POA: Insufficient documentation

## 2020-02-04 DIAGNOSIS — M48061 Spinal stenosis, lumbar region without neurogenic claudication: Secondary | ICD-10-CM | POA: Diagnosis not present

## 2020-02-04 DIAGNOSIS — M25562 Pain in left knee: Secondary | ICD-10-CM | POA: Diagnosis not present

## 2020-02-04 DIAGNOSIS — M25551 Pain in right hip: Secondary | ICD-10-CM | POA: Insufficient documentation

## 2020-02-04 DIAGNOSIS — G894 Chronic pain syndrome: Secondary | ICD-10-CM | POA: Diagnosis not present

## 2020-02-04 DIAGNOSIS — Z79899 Other long term (current) drug therapy: Secondary | ICD-10-CM | POA: Diagnosis not present

## 2020-02-04 DIAGNOSIS — M4310 Spondylolisthesis, site unspecified: Secondary | ICD-10-CM | POA: Diagnosis not present

## 2020-02-04 DIAGNOSIS — M5410 Radiculopathy, site unspecified: Secondary | ICD-10-CM

## 2020-02-04 DIAGNOSIS — M797 Fibromyalgia: Secondary | ICD-10-CM | POA: Diagnosis not present

## 2020-02-04 DIAGNOSIS — M533 Sacrococcygeal disorders, not elsewhere classified: Secondary | ICD-10-CM | POA: Insufficient documentation

## 2020-02-04 DIAGNOSIS — M47816 Spondylosis without myelopathy or radiculopathy, lumbar region: Secondary | ICD-10-CM

## 2020-02-04 DIAGNOSIS — M5126 Other intervertebral disc displacement, lumbar region: Secondary | ICD-10-CM

## 2020-02-04 DIAGNOSIS — M51379 Other intervertebral disc degeneration, lumbosacral region without mention of lumbar back pain or lower extremity pain: Secondary | ICD-10-CM

## 2020-02-04 DIAGNOSIS — Z6841 Body Mass Index (BMI) 40.0 and over, adult: Secondary | ICD-10-CM | POA: Diagnosis present

## 2020-02-04 DIAGNOSIS — M25561 Pain in right knee: Secondary | ICD-10-CM | POA: Diagnosis not present

## 2020-02-04 DIAGNOSIS — M5136 Other intervertebral disc degeneration, lumbar region: Secondary | ICD-10-CM

## 2020-02-04 DIAGNOSIS — M51369 Other intervertebral disc degeneration, lumbar region without mention of lumbar back pain or lower extremity pain: Secondary | ICD-10-CM

## 2020-02-04 MED ORDER — HYDROCODONE-ACETAMINOPHEN 5-325 MG PO TABS: 1.0000 | ORAL_TABLET | Freq: Four times a day (QID) | ORAL | 0 refills | Status: DC | PRN

## 2020-02-04 MED ORDER — KETOROLAC TROMETHAMINE 60 MG/2ML IM SOLN
INTRAMUSCULAR | Status: AC
  Filled 2020-02-04: qty 2

## 2020-02-04 MED ORDER — GABAPENTIN 300 MG PO CAPS: 300.0000 mg | ORAL_CAPSULE | Freq: Three times a day (TID) | ORAL | 5 refills | Status: DC

## 2020-02-04 MED ORDER — CYCLOBENZAPRINE HCL 10 MG PO TABS: 10.0000 mg | ORAL_TABLET | Freq: Every day | ORAL | 1 refills | Status: DC

## 2020-02-04 MED ORDER — ORPHENADRINE CITRATE 30 MG/ML IJ SOLN
INTRAMUSCULAR | Status: AC
  Filled 2020-02-04: qty 2

## 2020-02-04 MED ORDER — KETOROLAC TROMETHAMINE 60 MG/2ML IM SOLN
60.0000 mg | Freq: Once | INTRAMUSCULAR | Status: AC
  Administered 2020-02-04: 60 mg via INTRAMUSCULAR

## 2020-02-04 MED ORDER — ORPHENADRINE CITRATE 30 MG/ML IJ SOLN
60.0000 mg | Freq: Once | INTRAMUSCULAR | Status: AC
  Administered 2020-02-04: 60 mg via INTRAMUSCULAR

## 2020-02-04 NOTE — Progress Notes (Signed)
PROVIDER NOTE: Information contained herein reflects review and annotations entered in association with encounter. Interpretation of such information and data should be left to medically-trained personnel. Information provided to patient can be located elsewhere in the medical [name] under "Patient Instructions". Document created using STT-dictation technology, any transcriptional errors that may result from process are unintentional.    Patient: Wanda [name]  Service Category: E/M  Provider: Gaspar Cola, MD  DOB: May 01, 1946  DOS: 02/04/2020  Specialty: Interventional Pain Management  MRN: [id number]  Setting: Ambulatory outpatient  PCP: Colon Branch, MD  Type: Established Patient    Referring Provider: Colon Branch, MD  Location: Office  Delivery: Face-to-face     HPI  Reason for encounter: Wanda [name], a 74 y.o. year old female, is here today for evaluation and management of her Chronic pain syndrome [G89.4]. Wanda [name] primary complain today is Hip Pain (right) and Knee Pain (left) Last encounter: Practice (02/03/2020). My last encounter with her was on 02/03/2020. 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 with possible L3 nerve root compression); Chronic sacroiliac joint pain (Right); Chronic radicular pain of lower extremity (Left) (S1 dermatome); Osteoarthritis of hips (Bilateral); Chronic knee pain (Primary Area of Pain) (Bilateral) (L>R); Osteoarthritis of knee (Bilateral); Chronic hip pain (Bilateral) (L>R); Chronic low back pain (Bilateral) (L>R) w/ sciatica (Left); Chronic musculoskeletal pain; Abnormal MRI, lumbar spine (03/20/2018); Spondylosis without  myelopathy or radiculopathy, lumbosacral region; DDD (degenerative disc disease), lumbosacral; Chronic low back pain (Bilateral) (R>L) w/o sciatica; Acute exacerbation of chronic low back pain; Chronic knee pain (Left); and Chronic hip pain (Right) on their pertinent problem list. Pain Assessment: Severity of Chronic pain is reported as a 5 /10. Location: Back Lower/denies. Onset: More than a month ago. Quality: Aching, Sharp. Timing: Constant. Modifying factor(s): rest, medications. Vitals:  height is _0  (1.575 m) and weight is 236 lb (107 kg). Her blood pressure is 129/81 and her pulse is 76. Her respiration is 18 and oxygen saturation is 98%.    The patient indicates doing well with the current medication regimen. No adverse reactions or side effects reported to the medications. The patient comes into the clinic today with an acute flareup of her usual low back pain. Even though her usual low back pain is primarily in the left side, today she indicates that the pain is on the right side. Provocative examination today was positive for exact reproduction of the patient's pain on hyperextension and rotation towards the right side, as well as a positive Patrick maneuver on the right side for hip pain and SI joint pain. The patient also demonstrated decreased range of motion of her lumbar spine, both of her hips, and both of her knees. She has been having increased pain in the left knee as well.  To break this cycle of pain today I have provided patient with an IM injection of Toradol 60 mg + Norflex 60 mg. In addition, we will be scheduling her to return for a right sacroiliac joint block and a right lumbar facet block. Today I will be ordering x-rays of her left knee, right SI joint area, right hip, and lumbar spine.  She has indicated that she would like to have the back injections done  with some sedation.  Pharmacotherapy Assessment   Analgesic: Hydrocodone/APAP 5/325, 1 tab PO q 6 hrs (20 mg/day of  hydrocodone) MME/day:20 mg/day.   Monitoring:  PMP: PDMP reviewed during this encounter.       Pharmacotherapy: No side-effects or adverse reactions reported. Compliance: No problems identified. Effectiveness: Clinically acceptable.  Dewayne Shorter, RN  02/04/2020 11:48 AM  Signed Nursing Pain Medication Assessment:  Safety precautions to be maintained throughout the outpatient stay will include: orient to surroundings, keep bed in low position, maintain call bell within reach at all times, provide assistance with transfer out of bed and ambulation.  Medication Inspection Compliance: Wanda [name] did not comply with our request to bring her pills to be counted. She was reminded that bringing the medication bottles, even when empty, is a requirement.  Medication: None brought in. Pill/Patch Count: None available to be counted. Bottle Appearance: No container available. Did not bring bottle(s) to appointment. Filled Date: N/A Last Medication intake:  Today  Reminded to bring pills in to appointments.     UDS:  Summary  Date Value Ref Range Status  02/13/2019 Note  Final    Comment:    ==================================================================== [employer] Select 13 (MW) ==================================================================== Test                             Result       Flag       Units Drug Present and Declared for Prescription Verification   Hydrocodone                    645          EXPECTED   ng/mg creat   Hydromorphone                  68           EXPECTED   ng/mg creat   Dihydrocodeine                 54           EXPECTED   ng/mg creat   Norhydrocodone                 905          EXPECTED   ng/mg creat    Sources of hydrocodone include scheduled prescription medications.    Hydromorphone, dihydrocodeine and norhydrocodone are expected    metabolites of hydrocodone. Hydromorphone and dihydrocodeine are    also available as scheduled prescription  medications. ==================================================================== Test                      Result    Flag   Units      Ref Range   Creatinine              265              mg/dL      >=20 ==================================================================== Declared Medications:  The flagging and interpretation on this report are based on the  following declared medications.  Unexpected results may arise from  inaccuracies in the declared medications.  **Note: The testing scope of this panel includes these medications:  Hydrocodone  **Note: The testing scope of this panel does not include the  following reported medications:  Acetaminophen  Aspirin  Atorvastatin (Lipitor)  Cholecalciferol  Cyclobenzaprine  Gabapentin  Hydrochlorothiazide  Lisinopril  Sertraline ==================================================================== For clinical consultation, please  call [phone]. ====================================================================      ROS  Constitutional: Denies any fever or chills Gastrointestinal: No reported hemesis, hematochezia, vomiting, or acute GI distress Musculoskeletal: Denies any acute onset joint swelling, redness, loss of ROM, or weakness Neurological: No reported episodes of acute onset apraxia, aphasia, dysarthria, agnosia, amnesia, paralysis, loss of coordination, or loss of consciousness  Medication Review  HYDROcodone-acetaminophen, Vitamin D, Vitamin D (Ergocalciferol), aspirin EC, atorvastatin, calcium carbonate, cyclobenzaprine, gabapentin, hydrochlorothiazide, lisinopril, and sertraline  History Review  Allergy: Wanda [name] is allergic to simvastatin. Drug: Wanda [name]  reports no history of drug use. Alcohol:  reports no history of alcohol use. Tobacco:  reports that she has never smoked. She has never used smokeless tobacco. Social: Wanda [name]  reports that she has never smoked. She has never used smokeless  tobacco. She reports that she does not drink alcohol and does not use drugs. Medical:  has a past medical history of Allergy, Chronic back pain, Depression, DJD (degenerative joint disease), Hyperlipidemia, Hypertension, Menopause, Musculoskeletal pain (12/07/2015), and TMJ PAIN (05/17/2010). Surgical: Wanda [name]  has a past surgical history that includes G3 P3 . Family: family history includes Breast cancer in her sister; Cancer in her brother and sister; Colon cancer in her brother; Diabetes in an other family member; Kidney cancer in her son.  Laboratory Chemistry Profile   Renal Lab Results  Component Value Date   BUN 10 01/16/2020   CREATININE 0.66 40/02/6760   BCR NOT APPLICABLE 95/03/3266   GFR 105.13 12/24/2018   GFRAA 103 02/13/2019   GFRNONAA 89 02/13/2019     Hepatic Lab Results  Component Value Date   AST 19 01/16/2020   ALT 22 01/16/2020   ALBUMIN 4.5 02/13/2019   ALKPHOS 87 02/13/2019   HCVAB NEGATIVE 08/30/2016     Electrolytes Lab Results  Component Value Date   NA 138 01/16/2020   K 4.3 01/16/2020   CL 103 01/16/2020   CALCIUM 9.8 01/16/2020   MG 1.8 02/13/2019     Bone Lab Results  Component Value Date   VD25OH 25 (L) 01/16/2020   VD125OH2TOT 68 12/24/2017   [id number] 68 12/24/2017   VD2125OH2 <8 12/24/2017   25OHVITD1 22 (L) 02/13/2019   25OHVITD2 3.4 02/13/2019   25OHVITD3 19 02/13/2019     Inflammation (CRP: Acute Phase) (ESR: Chronic Phase) Lab Results  Component Value Date   CRP 8 02/13/2019   ESRSEDRATE 22 01/16/2020       Note: Above Lab results reviewed.  Recent Imaging Review  DG PAIN CLINIC C-ARM 1-60 MIN NO REPORT Fluoro was used, but no Radiologist interpretation will be provided.  Please refer to "NOTES" tab for provider progress note. Note: Reviewed        Physical Exam  General appearance: Well nourished, well developed, and well hydrated. In no apparent acute distress Mental status: Alert, oriented x 3 (person, place,  & time)       Respiratory: No evidence of acute respiratory distress Eyes: PERLA Vitals: BP 129/81   Pulse 76   Resp 18   Ht _0  (1.575 m)   Wt 236 lb (107 kg)   SpO2 98%   BMI 43.16 kg/m  BMI: Estimated body mass index is 43.16 kg/m as calculated from the following:   Height as of this encounter: _1  (1.575 m).   Weight as of this encounter: 236 lb (107 kg). Ideal: Ideal body weight: 50.1 kg (110 lb 7.2 oz) Adjusted ideal body weight: 72.9 kg (160 lb  10.7 oz)  Assessment   Status Diagnosis  Controlled Controlled Controlled 1. Chronic pain syndrome   2. Acute exacerbation of chronic low back pain   3. Chronic low back pain (Bilateral) (R>L) w/o sciatica   4. DDD (degenerative disc disease), lumbosacral   5. Grade 1 (53m) Anterolisthesis of L3 over L4.   6. Lumbar facet syndrome (Bilateral) (R>L)   7. Chronic radicular pain of lower extremity (Left) (S1 dermatome)   8. 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 Lumbar intervertebral disc bulge (L2-3, L3-4, L4-5, and L6-S1)   10. Chronic knee pain (Left)   11. Chronic knee pain (Primary Area of Pain) (Bilateral) (L>R)   12. Chronic hip pain (Right)   13. Fibromyalgia   14. Pharmacologic therapy   15. Neurogenic pain   16. Chronic musculoskeletal pain   17. Morbid obesity with body mass index (BMI) of 40.0 to 44.9 in adult [place]   18. Chronic sacroiliac joint pain (Right)      Updated Problems: Problem  Acute Exacerbation of Chronic Low Back Pain  Chronic knee pain (Left)  Chronic hip pain (Right)  Chronic low back pain (Bilateral) (R>L) w/o sciatica    Plan of Care  Problem-specific:  No problem-specific Assessment & Plan notes found for this encounter.  Ms. SSUEANN BROWNLEYhas a current medication list which includes the following long-term medication(s): atorvastatin, calcium carbonate, [START ON 02/05/2020] cyclobenzaprine, [START ON 02/05/2020] gabapentin,  hydrochlorothiazide, [START ON 02/07/2020] hydrocodone-acetaminophen, lisinopril, and sertraline.  Pharmacotherapy (Medications Ordered): Meds ordered this encounter  Medications  . cyclobenzaprine (FLEXERIL) 10 MG tablet    Sig: Take 1 tablet (10 mg total) by mouth at bedtime.    Dispense:  90 tablet    Refill:  1    Fill one day early if pharmacy is closed on scheduled refill date. May substitute for generic if available.  .Marland KitchenHYDROcodone-acetaminophen (NORCO/VICODIN) 5-325 MG tablet    Sig: Take 1 tablet by mouth every 6 (six) hours as needed for severe pain. Must last 30 days    Dispense:  120 tablet    Refill:  0    Chronic Pain: STOP Act (Not applicable) Fill 1 day early if closed on refill date. Do not fill until: 02/07/2020. To last until: 03/08/2020. Avoid benzodiazepines within 8 hours of opioids  . gabapentin (NEURONTIN) 300 MG capsule    Sig: Take 1 capsule (300 mg total) by mouth 3 (three) times daily.    Dispense:  90 capsule    Refill:  5    Fill one day early if pharmacy is closed on scheduled refill date. May substitute for generic if available.  .Marland Kitchenketorolac (TORADOL) injection 60 mg  . orphenadrine (NORFLEX) injection 60 mg   Orders:  Orders Placed This Encounter  Procedures  . LUMBAR FACET(MEDIAL BRANCH NERVE BLOCK) MBNB    Standing Status:   Future    Standing Expiration Date:   03/06/2020    Scheduling Instructions:     Procedure: Lumbar facet block (AKA.: Lumbosacral medial branch nerve block)     Side: Right-sided     Level: L3-4, L4-5, & L5-S1 Facets (L2, L3, L4, L5, & S1 Medial Branch Nerves)     Sedation: With Sedation.     Timeframe: ASAP    Order Specific Question:   Where will this procedure be performed?    Answer:   [facility]  . SACROILIAC JOINT INJECTION    Standing Status:  Future    Standing Expiration Date:   03/06/2020    Scheduling Instructions:     Side: Right-sided     Sedation: With Sedation.     Timeframe: ASAP    Order  Specific Question:   Where will this procedure be performed?    Answer:   [facility]  . DG HIP UNILAT W OR W/O PELVIS 2-3 VIEWS RIGHT    Standing Status:   Future    Standing Expiration Date:   02/03/2021    Scheduling Instructions:     Please describe any evidence of DJD, such as joint narrowing, asymmetry, cysts, or any anomalies in bone density, production, or erosion.    Order Specific Question:   Reason for Exam (SYMPTOM  OR DIAGNOSIS REQUIRED)    Answer:   Right hip pain/arthralgia    Order Specific Question:   Preferred imaging location?    Answer:   [facility]    Order Specific Question:   Call Results- Best Contact Number?    Answer:   [phone] (Pain Clinic facility) (Dr. Dossie Arbour)  . DG Knee 1-2 Views Left    Standing Status:   Future    Standing Expiration Date:   02/03/2021    Order Specific Question:   Reason for Exam (SYMPTOM  OR DIAGNOSIS REQUIRED)    Answer:   Right hip pain/arthralgia    Order Specific Question:   Preferred imaging location?    Answer:   [facility]    Order Specific Question:   Call Results- Best Contact Number?    Answer:   [phone] (Pain Clinic facility) (Dr. Dossie Arbour)  . DG Si Joints    Standing Status:   Future    Standing Expiration Date:   05/06/2020    Order Specific Question:   Reason for Exam (SYMPTOM  OR DIAGNOSIS REQUIRED)    Answer:   Right hip pain/arthralgia    Order Specific Question:   Preferred imaging location?    Answer:   [facility]    Order Specific Question:   Call Results- Best Contact Number?    Answer:   [phone] [facility])  . DG Lumbar Spine Complete W/Bend    Patient presents with axial pain with possible radicular component.  In addition to any acute findings, please report on:  1. Facet (Zygapophyseal) joint DJD (Hypertrophy, space narrowing, subchondral sclerosis, and/or osteophyte formation) 2. DDD and/or IVDD (Loss of disc height, desiccation or "Black disc  disease") 3. Pars defects 4. Spondylolisthesis, spondylosis, and/or spondyloarthropathies (include Degree/Grade of displacement in mm) 5. Vertebral body Fractures, including age (old, new/acute) 74. Modic Type Changes 7. Demineralization 8. Bone pathology 9. Central, Lateral Recess, and/or Foraminal Stenosis (include AP diameter of stenosis in mm) 10. Surgical changes (hardware type, status, and presence of fibrosis)  NOTE: Please specify level(s) and laterality. If applicable: Please indicate ROM and/or evidence of instability (>43m displacement between flexion and extension views)    Standing Status:   Future    Standing Expiration Date:   05/06/2020    Order Specific Question:   Reason for Exam (SYMPTOM  OR DIAGNOSIS REQUIRED)    Answer:   Low back pain    Order Specific Question:   Preferred imaging location?    Answer:   [facility]    Order Specific Question:   Call Results- Best Contact Number?    Answer:   [phone][facility]    Order Specific Question:   Radiology Contrast Protocol -  do NOT remove file path    Answer:   [email or web address]  . [employer] Select 13 (MW), Urine    Volume: 30 ml(s). Minimum 3 ml of urine is needed. Document temperature of fresh sample. Indications: Long term (current) use of opiate analgesic (V14.276)    Order Specific Question:   Release to patient    Answer:   Immediate   Follow-up plan:   Return for Procedure (w/ sedation): (L) L-FCT BLK + (L) SI BLK.      Considering:  NOTE: NO RFA until BMI <35. Diagnostic/palliative left L4-5 LESI  Diagnostic left L3 TFESI    Palliative PRN treatment(s):  Palliative bilateral lumbar facet block #3  Palliative left IA Hyalgan knee injection #S2/N3 (last series completed 01/15/2018) Palliative left vs bilateral IA knee injection #3 (w/ steroid) (last done 08/15/18)     Recent Visits Date Type Provider Dept  12/03/19 Telemedicine Milinda Pointer, MD [facility]  11/18/19 Procedure visit Milinda Pointer, MD [facility]  Showing recent visits within past 90 days and meeting all other requirements Today's Visits Date Type Provider Dept  02/04/20 Office Visit Milinda Pointer, MD [facility]  Showing today's visits and meeting all other requirements Future Appointments No visits were found meeting these conditions. Showing future appointments within next 90 days and meeting all other requirements  I discussed the assessment and treatment plan with the patient. The patient was provided an opportunity to ask questions and all were answered. The patient agreed with the plan and demonstrated an understanding of the instructions.  Patient advised to call back or seek an in-person evaluation if the symptoms or condition worsens.  Duration of encounter: 30 minutes.  Note by: Gaspar Cola, MD Date: 02/04/2020; Time: 12:53 PM

## 2020-02-04 NOTE — Progress Notes (Signed)
Nursing Pain Medication Assessment:  Safety precautions to be maintained throughout the outpatient stay will include: orient to surroundings, keep bed in low position, maintain call bell within reach at all times, provide assistance with transfer out of bed and ambulation.  Medication Inspection Compliance: Wanda [name] did not comply with our request to bring her pills to be counted. She was reminded that bringing the medication bottles, even when empty, is a requirement.  Medication: None brought in. Pill/Patch Count: None available to be counted. Bottle Appearance: No container available. Did not bring bottle(s) to appointment. Filled Date: N/A Last Medication intake:  Today  Reminded to bring pills in to appointments.

## 2020-02-04 NOTE — Patient Instructions (Addendum)
____________________________________________________________________________________________  Drug Holidays (Slow)  What is a "Drug Holiday"? Drug Holiday: is the name given to the period of time during which a patient stops taking a medication(s) for the purpose of eliminating tolerance to the drug.  Benefits . Improved effectiveness of opioids. . Decreased opioid dose needed to achieve benefits. . Improved pain with lesser dose.  What is tolerance? Tolerance: is the progressive decreased in effectiveness of a drug due to its repetitive use. With repetitive use, the body gets use to the medication and as a consequence, it loses its effectiveness. This is a common problem seen with opioid pain medications. As a result, a larger dose of the drug is needed to achieve the same effect that used to be obtained with a smaller dose.  How long should a "Drug Holiday" last? You should stay off of the pain medicine for at least 14 consecutive days. (2 weeks)  Should I stop the medicine "cold [place]"? No. You should always coordinate with your Pain Specialist so that he/she can provide you with the correct medication dose to make the transition as smoothly as possible.  How do I stop the medicine? Slowly. You will be instructed to decrease the daily amount of pills that you take by one (1) pill every seven (7) days. This is called a "slow downward taper" of your dose. For example: if you normally take four (4) pills per day, you will be asked to drop this dose to three (3) pills per day for seven (7) days, then to two (2) pills per day for seven (7) days, then to one (1) per day for seven (7) days, and at the end of those last seven (7) days, this is when the "Drug Holiday" would start.   Will I have withdrawals? By doing a "slow downward taper" like this one, it is unlikely that you will experience any significant withdrawal symptoms. Typically, what triggers withdrawals is the sudden stop of a high  dose opioid therapy. Withdrawals can usually be avoided by slowly decreasing the dose over a prolonged period of time.  What are withdrawals? Withdrawals: refers to the wide range of symptoms that occur after stopping or dramatically reducing opiate drugs after heavy and prolonged use. Withdrawal symptoms do not occur to patients that use low dose opioids, or those who take the medication sporadically. Contrary to benzodiazepine (example: Valium, Xanax, etc.) or alcohol withdrawals ("Delirium Tremens"), opioid withdrawals are not lethal. Withdrawals are the physical manifestation of the body getting rid of the excess receptors.  Expected Symptoms Early symptoms of withdrawal may include: . Agitation . Anxiety . Muscle aches . Increased tearing . Insomnia . Runny nose . Sweating . Yawning  Late symptoms of withdrawal may include: . Abdominal cramping . Diarrhea . Dilated pupils . Goose bumps . Nausea . Vomiting  Will I experience withdrawals? Due to the slow nature of the taper, it is very unlikely that you will experience any.  What is a slow taper? Taper: refers to the gradual decrease in dose.  ___________________________________________________________________________________________    ____________________________________________________________________________________________  Medication Rules  Purpose: To inform patients, and their family members, of our rules and regulations.  Applies to: All patients receiving prescriptions (written or electronic).  Pharmacy of record: Pharmacy where electronic prescriptions will be sent. If written prescriptions are taken to a different pharmacy, please inform the nursing staff. The pharmacy listed in the electronic medical record should be the one where you would like electronic prescriptions to be sent.  Electronic  prescriptions: In compliance with the [place] (STOP) Act of 2017 (Session  Lanny Cramp [phone]), effective July 24, 2018, all controlled substances must be electronically prescribed. Calling prescriptions to the pharmacy will cease to exist.  Prescription refills: Only during scheduled appointments. Applies to all prescriptions.  NOTE: The following applies primarily to controlled substances (Opioid* Pain Medications).   Type of encounter (visit): For patients receiving controlled substances, face-to-face visits are required. (Not an option or up to the patient.)  Patient's responsibilities: 1. Pain Pills: Bring all pain pills to every appointment (except for procedure appointments). 2. Pill Bottles: Bring pills in original pharmacy bottle. Always bring the newest bottle. Bring bottle, even if empty. 3. Medication refills: You are responsible for knowing and keeping track of what medications you take and those you need refilled. The day before your appointment: write a list of all prescriptions that need to be refilled. The day of the appointment: give the list to the admitting nurse. Prescriptions will be written only during appointments. No prescriptions will be written on procedure days. If you forget a medication: it will not be "Called in", "Faxed", or "electronically sent". You will need to get another appointment to get these prescribed. No early refills. Do not call asking to have your prescription filled early. 4. Prescription Accuracy: You are responsible for carefully inspecting your prescriptions before leaving our office. Have the discharge nurse carefully go over each prescription with you, before taking them home. Make sure that your name is accurately spelled, that your address is correct. Check the name and dose of your medication to make sure it is accurate. Check the number of pills, and the written instructions to make sure they are clear and accurate. Make sure that you are given enough medication to last until your next medication refill  appointment. 5. Taking Medication: Take medication as prescribed. When it comes to controlled substances, taking less pills or less frequently than prescribed is permitted and encouraged. Never take more pills than instructed. Never take medication more frequently than prescribed.  6. Inform other Doctors: Always inform, all of your healthcare providers, of all the medications you take. 7. Pain Medication from other Providers: You are not allowed to accept any additional pain medication from any other Doctor or Healthcare provider. There are two exceptions to this rule. (see below) In the event that you require additional pain medication, you are responsible for notifying us, as stated below. 8. Medication Agreement: You are responsible for carefully reading and following our Medication Agreement. This must be signed before receiving any prescriptions from our practice. Safely store a copy of your signed Agreement. Violations to the Agreement will result in no further prescriptions. (Additional copies of our Medication Agreement are available upon request.) 9. Laws, Rules, & Regulations: All patients are expected to follow all Federal and [employer], [employer], Rules, [employer]. Ignorance of the Laws does not constitute a valid excuse.  10. Illegal drugs and Controlled Substances: The use of illegal substances (including, but not limited to marijuana and its derivatives) and/or the illegal use of any controlled substances is strictly prohibited. Violation of this rule may result in the immediate and permanent discontinuation of any and all prescriptions being written by our practice. The use of any illegal substances is prohibited. 11. Adopted CDC guidelines & recommendations: Target dosing levels will be at or below 60 MME/day. Use of benzodiazepines** is not recommended.  Exceptions: There are only two exceptions to the rule of not  receiving pain medications from other Healthcare  Providers. 1. Exception #1 (Emergencies): In the event of an emergency (i.e.: accident requiring emergency care), you are allowed to receive additional pain medication. However, you are responsible for: As soon as you are able, call our office [phone], at any time of the day or night, and leave a message stating your name, the date and nature of the emergency, and the name and dose of the medication prescribed. In the event that your call is answered by a member of our staff, make sure to document and save the date, time, and the name of the person that took your information.  2. Exception #2 (Planned Surgery): In the event that you are scheduled by another doctor or dentist to have any type of surgery or procedure, you are allowed (for a period no longer than 30 days), to receive additional pain medication, for the acute post-op pain. However, in this case, you are responsible for picking up a copy of our "Post-op Pain Management for Surgeons" handout, and giving it to your surgeon or dentist. This document is available at our office, and does not require an appointment to obtain it. Simply go to our office during business hours (Monday-Thursday from 8:00 AM to 4:00 PM) (Friday 8:00 AM to 12:00 Noon) or if you have a scheduled appointment with [place], prior to your surgery, and ask for it by name. In addition, you will need to provide [place] with your name, name of your surgeon, type of surgery, and date of procedure or surgery.  *Opioid medications include: morphine, codeine, oxycodone, oxymorphone, hydrocodone, hydromorphone, meperidine, tramadol, tapentadol, buprenorphine, fentanyl, methadone. **Benzodiazepine medications include: diazepam (Valium), alprazolam (Xanax), clonazepam (Klonopine), lorazepam (Ativan), clorazepate (Tranxene), chlordiazepoxide (Librium), estazolam (Prosom), oxazepam (Serax), temazepam (Restoril), triazolam (Halcion) (Last updated:  09/20/2017) ____________________________________________________________________________________________   ____________________________________________________________________________________________  Medication Recommendations and Reminders  Applies to: All patients receiving prescriptions (written and/or electronic).  Medication Rules & Regulations: These rules and regulations exist for your safety and that of others. They are not flexible and neither are we. Dismissing or ignoring them will be considered "non-compliance" with medication therapy, resulting in complete and irreversible termination of such therapy. (See document titled "Medication Rules" for more details.) In all conscience, because of safety reasons, we cannot continue providing a therapy where the patient does not follow instructions.  Pharmacy of record:   Definition: This is the pharmacy where your electronic prescriptions will be sent.   We do not endorse any particular pharmacy.  You are not restricted in your choice of pharmacy.  The pharmacy listed in the electronic medical record should be the one where you want electronic prescriptions to be sent.  If you choose to change pharmacy, simply notify our nursing staff of your choice of new pharmacy.  Recommendations:  Keep all of your pain medications in a safe place, under lock and key, even if you live alone.   After you fill your prescription, take 1 week's worth of pills and put them away in a safe place. You should keep a separate, properly labeled bottle for this purpose. The remainder should be kept in the original bottle. Use this as your primary supply, until it runs out. Once it's gone, then you know that you have 1 week's worth of medicine, and it is time to come in for a prescription refill. If you do this correctly, it is unlikely that you will ever run out of medicine.  To make sure that the above recommendation works,  it is very important that you  make sure your medication refill appointments are scheduled at least 1 week before you run out of medicine. To do this in an effective manner, make sure that you do not leave the office without scheduling your next medication management appointment. Always ask the nursing staff to show you in your prescription , when your medication will be running out. Then arrange for the receptionist to get you a return appointment, at least 7 days before you run out of medicine. Do not wait until you have 1 or 2 pills left, to come in. This is very poor planning and does not take into consideration that we may need to cancel appointments due to bad weather, sickness, or emergencies affecting our staff.  "Partial Fill": If for any reason your pharmacy does not have enough pills/tablets to completely fill or refill your prescription, do not allow for a "partial fill". You will need a separate prescription to fill the remaining amount, which we will not provide. If the reason for the partial fill is your insurance, you will need to talk to the pharmacist about payment alternatives for the remaining tablets, but again, do not accept a partial fill.  Prescription refills and/or changes in medication(s):   Prescription refills, and/or changes in dose or medication, will be conducted only during scheduled medication management appointments. (Applies to both, written and electronic prescriptions.)  No refills on procedure days. No medication will be changed or started on procedure days. No changes, adjustments, and/or refills will be conducted on a procedure day. Doing so will interfere with the diagnostic portion of the procedure.  No phone refills. No medications will be "called into the pharmacy".  No Fax refills.  No weekend refills.  No Holliday refills.  No after hours refills.  Remember:  Business hours are:  Monday to Thursday 8:00 AM to 4:00 PM Provider's Schedule: Milinda Pointer, MD - Appointments  are:  Medication management: Monday and Wednesday 8:00 AM to 4:00 PM Procedure day: Tuesday and Thursday 7:30 AM to 4:00 PM Gillis Santa, MD - Appointments are:  Medication management: Tuesday and Thursday 8:00 AM to 4:00 PM Procedure day: Monday and Wednesday 7:30 AM to 4:00 PM (Last update: 09/20/2017) ____________________________________________________________________________________________   ____________________________________________________________________________________________  CANNABIDIOL (AKA: CBD Oil or Pills)  Applies to: All patients receiving prescriptions of controlled substances (written and/or electronic).  General Information: Cannabidiol (CBD) was discovered in 64. It is one of some 113 identified cannabinoids in cannabis (Marijuana) plants, accounting for up to 40% of the plant's extract. As of 2018, preliminary clinical research on cannabidiol included studies of anxiety, cognition, movement disorders, and pain.  Cannabidiol is consummed in multiple ways, including inhalation of cannabis smoke or vapor, as an aerosol spray into the cheek, and by mouth. It may be supplied as CBD oil containing CBD as the active ingredient (no added tetrahydrocannabinol (THC) or terpenes), a full-plant CBD-dominant hemp extract oil, capsules, dried cannabis, or as a liquid solution. CBD is thought not have the same psychoactivity as THC, and may affect the actions of THC. Studies suggest that CBD may interact with different biological targets, including cannabinoid receptors and other neurotransmitter receptors. As of 2018 the mechanism of action for its biological effects has not been determined.  In the [place], cannabidiol has a limited approval by the Food and Drug Administration (FDA) for treatment of only two types of epilepsy disorders. The side effects of long-term use of the drug include somnolence, decreased appetite, diarrhea,  fatigue, malaise, weakness, sleeping  problems, and others.  CBD remains a Schedule I drug prohibited for any use.  Legality: Some manufacturers ship CBD products nationally, an illegal action which the FDA has not enforced in 2018, with CBD remaining the subject of an FDA investigational new drug evaluation, and is not considered legal as a dietary supplement or food ingredient as of December 2018. Federal illegality has made it difficult historically to conduct research on CBD. CBD is openly sold in head shops and health food stores in some states where such sales have not been explicitly legalized.  Warning: Because it is not FDA approved for general use or treatment of pain, it is not required to undergo the same manufacturing controls as prescription drugs.  This means that the available cannabidiol (CBD) may be contaminated with THC.  If this is the case, it will trigger a positive urine drug screen (UDS) test for cannabinoids (Marijuana).  Because a positive UDS for illicit substances is a violation of our medication agreement, your opioid analgesics (pain medicine) may be permanently discontinued. (Last update: 10/11/2017) ____________________________________________________________________________________________    ______________________________________________________________________________________________  Weight Management Required  URGENT: Your weight has been found to be adversely affecting your health.  Dear [name]:  Your current Estimated body mass index is 43.16 kg/m as calculated from the following:   Height as of this encounter: 5' 2" (1.575 m).   Weight as of this encounter: 236 lb (107 kg).  Please use the table below to identify your weight category and associated incidence of chronic pain, secondary to your weight.  Body Mass Index (BMI) Classification BMI level (kg/m2) Category Associated incidence of chronic pain  <18  Underweight   18.5-24.9 Ideal body weight   25-29.9 Overweight  20%   30-34.9 Obese (Class I)  68%  35-39.9 Severe obesity (Class II)  136%  >40 Extreme obesity (Class III)  254%   In addition: You will be considered "Morbidly Obese", if your BMI is above 30 and you have one or more of the following conditions which are known to be caused and/or directly associated with obesity: 1.    Type 2 Diabetes (Which in turn can lead to cardiovascular diseases (CVD), stroke, peripheral vascular diseases (PVD), retinopathy, nephropathy, and neuropathy) 2.    Cardiovascular Disease (High Blood Pressure; Congestive Heart Failure; High Cholesterol; Coronary Artery Disease; Angina; or History of Heart Attacks) 3.    Breathing problems (Asthma; obesity-hypoventilation syndrome; obstructive sleep apnea; chronic inflammatory airway disease; reactive airway disease; or shortness of breath) 4.    Chronic kidney disease 5.    Liver disease (nonalcoholic fatty liver disease) 6.    High blood pressure 7.    Acid reflux (gastroesophageal reflux disease; heartburn) 8.    Osteoarthritis (OA) (with any of the following: hip pain; knee pain; and/or low back pain) 9.    Low back pain (Lumbar Facet Syndrome; and/or Degenerative Disc Disease) 10.  Hip pain (Osteoarthritis of hip) (For every 1 lbs of added body weight, there is a 2 lbs increase in pressure inside of each hip articulation. 1:2 mechanical relationship) 11.  Knee pain (Osteoarthritis of knee) (For every 1 lbs of added body weight, there is a 4 lbs increase in pressure inside of each knee articulation. 1:4 mechanical relationship) (patients with a BMI>30 kg/m2 were 6.8 times more likely to develop knee OA than normal-weight individuals) 12.  Cancer: Epidemiological studies have shown that obesity is a risk factor for: post-menopausal breast cancer; cancers of the  endometrium, colon and kidney cancer; malignant adenomas of the oesophagus. Obese subjects have an approximately 1.5-3.5-fold increased risk of developing these cancers  compared with normal-weight subjects, and it has been estimated that between 15 and 45% of these cancers can be attributed to overweight. More recent studies suggest that obesity may also increase the risk of other types of cancer, including pancreatic, hepatic and gallbladder cancer. (Ref: Obesity and cancer. Pischon T, Nthlings U, [employer] H. Proc Nutr Soc. 2008 May;67(2):128-45. doi: 13.0865/H8469629528413244.) The International Agency for Research on Cancer (IARC) has identified 13 cancers associated with overweight and obesity: meningioma, multiple myeloma, adenocarcinoma of the esophagus, and cancers of the thyroid, postmenopausal breast cancer, gallbladder, stomach, liver, pancreas, kidney, ovaries, uterus, colon and rectal (colorectal) cancers. 17 percent of all cancers diagnosed in women and 24 percent of those diagnosed in men are associated with overweight and obesity.  Recommendation: At this point it is urgent that you take a step back and concentrate in loosing weight. Dedicate 100% of your efforts on this task. Nothing else will improve your health more than bringing your weight down and your BMI to less than 30. If you are here, you probably have chronic pain. Because most chronic pain patients have difficulty exercising secondary to their pain, you must rely on proper nutrition and diet in order to lose the weight. If your BMI is above 40, you should seriously consider bariatric surgery. A realistic goal is to lose 10% of your body weight over a period of 12 months.  Be honest to yourself, if over time you have unsuccessfully tried to lose weight, then it is time for you to seek professional help and to enter a medically supervised weight management program, and/or undergo bariatric surgery. Stop procrastinating.   Pain management considerations:  1.    Pharmacological Problems: Be advised that the use of opioid analgesics (oxycodone; hydrocodone; morphine; methadone; codeine; and all of their  derivatives) have been associated with decreased metabolism and weight gain.  For this reason, should we see that you are unable to lose weight while taking these medications, it may become necessary for [place] to taper down and indefinitely discontinue them.  2.    Technical Problems: The incidence of successful interventional therapies decreases as the patient's BMI increases. It is much more difficult to accomplish a safe and effective interventional therapy on a patient with a BMI above 35. 3.    Radiation Exposure Problems: The x-rays machine, used to accomplish injection therapies, will automatically increase their x-ray output in order to capture an appropriate bone image. This means that radiation exposure increases exponentially with the patient's BMI. (The higher the BMI, the higher the radiation exposure.) Although the level of radiation used at a given time is still safe to the patient, it is not for the physician and/or assisting staff. Unfortunately, radiation exposure is accumulative. Because physicians and the staff have to do procedures and be exposed on a daily basis, this can result in health problems such as cancer and radiation burns. Radiation exposure to the staff is monitored by the radiation batches that they wear. The exposure levels are reported back to the staff on a quarterly basis. Depending on levels of exposure, physicians and staff may be obligated by law to decrease this exposure. This means that they have the right and obligation to refuse providing therapies where they may be overexposed to radiation. For this reason, physicians may decline to offer therapies such as radiofrequency ablation or implants to patients with  a BMI above 40. 4.    Current Trends: Be advised that the current trend is to no longer offer certain therapies to patients with a BMI equal to, or above 35, due to increase perioperative risks, increased technical procedural difficulties, and excessive radiation  exposure to healthcare personnel.  ______________________________________________________________________________________________ Prescriptions for Hydrocodone, Gabapentin, and Cyclobenzaprine have been sent to your pharmacy.

## 2020-02-05 ENCOUNTER — Ambulatory Visit
Admission: RE | Admit: 2020-02-05 | Discharge: 2020-02-05 | Disposition: A | Discharge status: Home / Self Care | Payer: Medicare HMO | Source: Ambulatory Visit | Attending: Pain Medicine | Admitting: Pain Medicine

## 2020-02-05 DIAGNOSIS — M25561 Pain in right knee: Secondary | ICD-10-CM | POA: Diagnosis not present

## 2020-02-05 DIAGNOSIS — R296 Repeated falls: Secondary | ICD-10-CM | POA: Diagnosis not present

## 2020-02-05 DIAGNOSIS — G8929 Other chronic pain: Secondary | ICD-10-CM

## 2020-02-05 DIAGNOSIS — M5137 Other intervertebral disc degeneration, lumbosacral region: Secondary | ICD-10-CM

## 2020-02-05 DIAGNOSIS — M25551 Pain in right hip: Secondary | ICD-10-CM | POA: Diagnosis not present

## 2020-02-05 DIAGNOSIS — M48061 Spinal stenosis, lumbar region without neurogenic claudication: Secondary | ICD-10-CM | POA: Diagnosis not present

## 2020-02-05 DIAGNOSIS — M5410 Radiculopathy, site unspecified: Secondary | ICD-10-CM | POA: Insufficient documentation

## 2020-02-05 DIAGNOSIS — M1712 Unilateral primary osteoarthritis, left knee: Secondary | ICD-10-CM | POA: Diagnosis not present

## 2020-02-05 DIAGNOSIS — M545 Low back pain, unspecified: Secondary | ICD-10-CM

## 2020-02-05 DIAGNOSIS — M533 Sacrococcygeal disorders, not elsewhere classified: Secondary | ICD-10-CM | POA: Diagnosis not present

## 2020-02-05 DIAGNOSIS — M4319 Spondylolisthesis, multiple sites in spine: Secondary | ICD-10-CM | POA: Diagnosis not present

## 2020-02-05 DIAGNOSIS — M25562 Pain in left knee: Secondary | ICD-10-CM

## 2020-02-05 DIAGNOSIS — M47816 Spondylosis without myelopathy or radiculopathy, lumbar region: Secondary | ICD-10-CM | POA: Diagnosis not present

## 2020-02-05 DIAGNOSIS — M47817 Spondylosis without myelopathy or radiculopathy, lumbosacral region: Secondary | ICD-10-CM | POA: Diagnosis not present

## 2020-02-05 DIAGNOSIS — M5135 Other intervertebral disc degeneration, thoracolumbar region: Secondary | ICD-10-CM | POA: Diagnosis not present

## 2020-02-07 LAB — TOXASSURE SELECT 13 (MW), URINE

## 2020-02-10 ENCOUNTER — Other Ambulatory Visit: Payer: Self-pay | Admitting: Internal Medicine

## 2020-03-02 ENCOUNTER — Ambulatory Visit: Payer: Medicare HMO | Admitting: Pain Medicine

## 2020-03-05 NOTE — Progress Notes (Signed)
This patient's chart is under "My Open Charts". These are cancelled appointments that keep popping into my "In Basket" as a deficiency. See what you can do to remove them.   Thank you. 

## 2020-03-11 ENCOUNTER — Ambulatory Visit: Payer: Medicare HMO | Admitting: Pain Medicine

## 2020-03-19 ENCOUNTER — Encounter: Payer: Self-pay | Admitting: Internal Medicine

## 2020-03-31 NOTE — Patient Instructions (Signed)

## 2020-03-31 NOTE — Progress Notes (Signed)
PROVIDER NOTE: Information contained herein reflects review and annotations entered in association with encounter. Interpretation of such information and data should be left to medically-trained personnel. Information provided to patient can be located elsewhere in the medical record under "Patient Instructions". Document created using STT-dictation technology, any transcriptional errors that may result from process are unintentional.    Patient: Wanda [name]  Service Category: Procedure  Provider: Gaspar Cola, MD  DOB: [birth year]  DOS: 04/01/2020  Location: [facility]  MRN: [id number]  Setting: Ambulatory - outpatient  Referring Provider: Colon Branch, MD  Type: Established Patient  Specialty: Interventional Pain Management  PCP: Colon Branch, MD   Primary Reason for Visit: Interventional Pain Management Treatment. CC: Back Pain (low)  Procedure:          Anesthesia, Analgesia, Anxiolysis:  Type: Lumbar Facet, Medial Branch Block(s)          Primary Purpose: Diagnostic Region: Posterolateral Lumbosacral Spine Level: L2, L3, L4, L5, & S1 Medial Branch Level(s). Injecting these levels blocks the L3-4, L4-5, and L5-S1 lumbar facet joints. Laterality: Right  Type: Moderate (Conscious) Sedation combined with Local Anesthesia Indication(s): Analgesia and Anxiety Route: Intravenous (IV) IV Access: Secured Sedation: Meaningful verbal contact was maintained at all times during the procedure  Local Anesthetic: Lidocaine 1-2%  Position: Prone   Indications: 1. Lumbar facet syndrome (Bilateral) (R>L)   2. Spondylosis without myelopathy or radiculopathy, lumbosacral region   3. Grade 1 (71mm) Anterolisthesis of L3 over L4.   4. Chronic low back pain (Bilateral) (R>L) w/o sciatica   5. DDD (degenerative disc disease), lumbosacral    Pain Score: Pre-procedure: 6 /10 Post-procedure: 0-No pain/10   Pre-op Assessment:  Ms. Angert is a 74 y.o. (year old), female patient,  seen today for interventional treatment. She  has a past surgical history that includes G3 P3 . Ms. Koziol has a current medication list which includes the following prescription(s): aspirin ec, atorvastatin, calcium carbonate, vitamin d, cyclobenzaprine, gabapentin, hydrochlorothiazide, hydrocodone-acetaminophen, [START ON 05/01/2020] hydrocodone-acetaminophen, [START ON 05/31/2020] hydrocodone-acetaminophen, lisinopril, sertraline, and vitamin d (ergocalciferol), and the following Facility-Administered Medications: fentanyl and midazolam. Her primarily concern today is the Back Pain (low)  Initial Vital Signs:  Pulse/HCG Rate: 74ECG Heart Rate: 80 Temp: (!) 97.4 F (36.3 C) Resp: 18 BP: (!) 129/98 SpO2: 97 %  BMI: Estimated body mass index is 40.51 kg/m as calculated from the following:   Height as of this encounter: 5\' 4"  (1.626 m).   Weight as of this encounter: 236 lb (107 kg).  Risk Assessment: Allergies: Reviewed. She is allergic to simvastatin.  Allergy Precautions: None required Coagulopathies: Reviewed. None identified.  Blood-thinner therapy: None at this time Active Infection(s): Reviewed. None identified. Ms. Ellington is afebrile  Site Confirmation: Ms. Grissom was asked to confirm the procedure and laterality before marking the site Procedure checklist: Completed Consent: Before the procedure and under the influence of no sedative(s), amnesic(s), or anxiolytics, the patient was informed of the treatment options, risks and possible complications. To fulfill our ethical and legal obligations, as recommended by the American Medical Association's Code of Ethics, I have informed the patient of my clinical impression; the nature and purpose of the treatment or procedure; the risks, benefits, and possible complications of the intervention; the alternatives, including doing nothing; the risk(s) and benefit(s) of the alternative treatment(s) or procedure(s); and the risk(s) and benefit(s)  of doing nothing. The patient was provided information about the general risks and possible complications associated with  the procedure. These may include, but are not limited to: failure to achieve desired goals, infection, bleeding, organ or nerve damage, allergic reactions, paralysis, and death. In addition, the patient was informed of those risks and complications associated to Spine-related procedures, such as failure to decrease pain; infection (i.e.: Meningitis, epidural or intraspinal abscess); bleeding (i.e.: epidural hematoma, subarachnoid hemorrhage, or any other type of intraspinal or peri-dural bleeding); organ or nerve damage (i.e.: Any type of peripheral nerve, nerve root, or spinal cord injury) with subsequent damage to sensory, motor, and/or autonomic systems, resulting in permanent pain, numbness, and/or weakness of one or several areas of the body; allergic reactions; (i.e.: anaphylactic reaction); and/or death. Furthermore, the patient was informed of those risks and complications associated with the medications. These include, but are not limited to: allergic reactions (i.e.: anaphylactic or anaphylactoid reaction(s)); adrenal axis suppression; blood sugar elevation that in diabetics may result in ketoacidosis or comma; water retention that in patients with history of congestive heart failure may result in shortness of breath, pulmonary edema, and decompensation with resultant heart failure; weight gain; swelling or edema; medication-induced neural toxicity; particulate matter embolism and blood vessel occlusion with resultant organ, and/or nervous system infarction; and/or aseptic necrosis of one or more joints. Finally, the patient was informed that Medicine is not an exact science; therefore, there is also the possibility of unforeseen or unpredictable risks and/or possible complications that may result in a catastrophic outcome. The patient indicated having understood very clearly. We  have given the patient no guarantees and we have made no promises. Enough time was given to the patient to ask questions, all of which were answered to the patient's satisfaction. Ms. Colclasure has indicated that she wanted to continue with the procedure. Attestation: I, the ordering provider, attest that I have discussed with the patient the benefits, risks, side-effects, alternatives, likelihood of achieving goals, and potential problems during recovery for the procedure that I have provided informed consent. Date  Time: 04/01/2020  8:13 AM  Pre-Procedure Preparation:  Monitoring: As per clinic protocol. Respiration, ETCO2, SpO2, BP, heart rate and rhythm monitor placed and checked for adequate function Safety Precautions: Patient was assessed for positional comfort and pressure points before starting the procedure. Time-out: I initiated and conducted the "Time-out" before starting the procedure, as per protocol. The patient was asked to participate by confirming the accuracy of the "Time Out" information. Verification of the correct person, site, and procedure were performed and confirmed by me, the nursing staff, and the patient. "Time-out" conducted as per Joint Commission's Universal Protocol (UP.01.01.01). Time: 0920  Description of Procedure:          Laterality: Right Levels:  L2, L3, L4, L5, & S1 Medial Branch Level(s) Area Prepped: Posterior Lumbosacral Region DuraPrep (Iodine Povacrylex [0.7% available iodine] and Isopropyl Alcohol, 74% w/w) Safety Precautions: Aspiration looking for blood return was conducted prior to all injections. At no point did we inject any substances, as a needle was being advanced. Before injecting, the patient was told to immediately notify me if she was experiencing any new onset of "ringing in the ears, or metallic taste in the mouth". No attempts were made at seeking any paresthesias. Safe injection practices and needle disposal techniques used. Medications  properly checked for expiration dates. SDV (single dose vial) medications used. After the completion of the procedure, all disposable equipment used was discarded in the proper designated medical waste containers. Local Anesthesia: Protocol guidelines were followed. The patient was positioned over the fluoroscopy table.  The area was prepped in the usual manner. The time-out was completed. The target area was identified using fluoroscopy. A 12-in long, straight, sterile hemostat was used with fluoroscopic guidance to locate the targets for each level blocked. Once located, the skin was marked with an approved surgical skin marker. Once all sites were marked, the skin (epidermis, dermis, and hypodermis), as well as deeper tissues (fat, connective tissue and muscle) were infiltrated with a small amount of a short-acting local anesthetic, loaded on a 10cc syringe with a 25G, 1.5-in  Needle. An appropriate amount of time was allowed for local anesthetics to take effect before proceeding to the next step. Local Anesthetic: Lidocaine 2.0% The unused portion of the local anesthetic was discarded in the proper designated containers. Technical explanation of process:  L2 Medial Branch Nerve Block (MBB): The target area for the L2 medial branch is at the junction of the postero-lateral aspect of the superior articular process and the superior, posterior, and medial edge of the transverse process of L3. Under fluoroscopic guidance, a Quincke needle was inserted until contact was made with os over the superior postero-lateral aspect of the pedicular shadow (target area). After negative aspiration for blood, 0.5 mL of the nerve block solution was injected without difficulty or complication. The needle was removed intact. L3 Medial Branch Nerve Block (MBB): The target area for the L3 medial branch is at the junction of the postero-lateral aspect of the superior articular process and the superior, posterior, and medial edge of  the transverse process of L4. Under fluoroscopic guidance, a Quincke needle was inserted until contact was made with os over the superior postero-lateral aspect of the pedicular shadow (target area). After negative aspiration for blood, 0.5 mL of the nerve block solution was injected without difficulty or complication. The needle was removed intact. L4 Medial Branch Nerve Block (MBB): The target area for the L4 medial branch is at the junction of the postero-lateral aspect of the superior articular process and the superior, posterior, and medial edge of the transverse process of L5. Under fluoroscopic guidance, a Quincke needle was inserted until contact was made with os over the superior postero-lateral aspect of the pedicular shadow (target area). After negative aspiration for blood, 0.5 mL of the nerve block solution was injected without difficulty or complication. The needle was removed intact. L5 Medial Branch Nerve Block (MBB): The target area for the L5 medial branch is at the junction of the postero-lateral aspect of the superior articular process and the superior, posterior, and medial edge of the sacral ala. Under fluoroscopic guidance, a Quincke needle was inserted until contact was made with os over the superior postero-lateral aspect of the pedicular shadow (target area). After negative aspiration for blood, 0.5 mL of the nerve block solution was injected without difficulty or complication. The needle was removed intact. S1 Medial Branch Nerve Block (MBB): The target area for the S1 medial branch is at the posterior and inferior 6 o'[name] position of the L5-S1 facet joint. Under fluoroscopic guidance, the Quincke needle inserted for the L5 MBB was redirected until contact was made with os over the inferior and postero aspect of the sacrum, at the 6 o' [name] position under the L5-S1 facet joint (Target area). After negative aspiration for blood, 0.5 mL of the nerve block solution was injected without  difficulty or complication. The needle was removed intact.  Nerve block solution: 0.2% PF-Ropivacaine + Triamcinolone (40 mg/mL) diluted to a final concentration of 4 mg of Triamcinolone/mL of  Ropivacaine The unused portion of the solution was discarded in the proper designated containers. Procedural Needles: 22-gauge, 3.5-inch, Quincke needles used for all levels.  Once the entire procedure was completed, the treated area was cleaned, making sure to leave some of the prepping solution back to take advantage of its long term bactericidal properties.   Illustration of the posterior view of the lumbar spine and the posterior neural structures. Laminae of L2 through S1 are labeled. DPRL5, dorsal primary ramus of L5; DPRS1, dorsal primary ramus of S1; DPR3, dorsal primary ramus of L3; FJ, facet (zygapophyseal) joint L3-L4; I, inferior articular process of L4; LB1, lateral branch of dorsal primary ramus of L1; IAB, inferior articular branches from L3 medial branch (supplies L4-L5 facet joint); IBP, intermediate branch plexus; MB3, medial branch of dorsal primary ramus of L3; NR3, third lumbar nerve root; S, superior articular process of L5; SAB, superior articular branches from L4 (supplies L4-5 facet joint also); TP3, transverse process of L3.  Vitals:   04/01/20 0929 04/01/20 0936 04/01/20 0945 04/01/20 0955  BP: 131/85 127/77 136/79 137/83  Pulse:      Resp: 14 11 (!) 9 11  Temp:  (!) 96.7 F (35.9 C)  (!) 97 F (36.1 C)  TempSrc:  Temporal  Temporal  SpO2: 97% 99% 98% 100%  Weight:      Height:         Start Time: 0920 hrs. End Time: 0929 hrs.  Imaging Guidance (Spinal):          Type of Imaging Technique: Fluoroscopy Guidance (Spinal) Indication(s): Assistance in needle guidance and placement for procedures requiring needle placement in or near specific anatomical locations not easily accessible without such assistance. Exposure Time: Please see nurses notes. Contrast: None  used. Fluoroscopic Guidance: I was personally present during the use of fluoroscopy. "Tunnel Vision Technique" used to obtain the best possible view of the target area. Parallax error corrected before commencing the procedure. "Direction-depth-direction" technique used to introduce the needle under continuous pulsed fluoroscopy. Once target was reached, antero-posterior, oblique, and lateral fluoroscopic projection used confirm needle placement in all planes. Images permanently stored in EMR. Interpretation: No contrast injected. I personally interpreted the imaging intraoperatively. Adequate needle placement confirmed in multiple planes. Permanent images saved into the patient's record.  Antibiotic Prophylaxis:   Anti-infectives (From admission, onward)   None     Indication(s): None identified  Post-operative Assessment:  Post-procedure Vital Signs:  Pulse/HCG Rate: 7464 Temp: (!) 97 F (36.1 C) Resp: 11 BP: 137/83 SpO2: 100 %  EBL: None  Complications: No immediate post-treatment complications observed by team, or reported by patient.  Note: The patient tolerated the entire procedure well. A repeat set of vitals were taken after the procedure and the patient was kept under observation following institutional policy, for this type of procedure. Post-procedural neurological assessment was performed, showing return to baseline, prior to discharge. The patient was provided with post-procedure discharge instructions, including a section on how to identify potential problems. Should any problems arise concerning this procedure, the patient was given instructions to immediately contact us, at any time, without hesitation. In any case, we plan to contact the patient by telephone for a follow-up status report regarding this interventional procedure.  Comments:  No additional relevant information.  Plan of Care  Orders:  Orders Placed This Encounter  Procedures  . LUMBAR FACET(MEDIAL BRANCH  NERVE BLOCK) MBNB    Scheduling Instructions:     Procedure: Lumbar facet block (AKA.: Lumbosacral medial  branch nerve block)     Side: Right     Level: L3-4, L4-5, & L5-S1 Facets (L2, L3, L4, L5, & S1 Medial Branch Nerves)     Sedation: Patient's choice.     Timeframe: Today    Order Specific Question:   Where will this procedure be performed?    Answer:   [facility]  . [facility] C-ARM 1-60 MIN NO REPORT    Intraoperative interpretation by procedural physician at [place].    Standing Status:   Standing    Number of Occurrences:   1    Order Specific Question:   Reason for exam:    Answer:   Assistance in needle guidance and placement for procedures requiring needle placement in or near specific anatomical locations not easily accessible without such assistance.  . Informed Consent Details: Physician/Practitioner Attestation; Transcribe to consent form and obtain patient signature    Nursing Order: Transcribe to consent form and obtain patient signature. Note: Always confirm laterality of pain with Ms. Wisby, before procedure. Procedure: Lumbar Facet Block  under fluoroscopic guidance Indication/Reason: Low Back Pain, with our without leg pain, due to Facet Joint Arthralgia (Joint Pain) known as Lumbar Facet Syndrome, secondary to Lumbar, and/or Lumbosacral Spondylosis (Arthritis of the Spine), without myelopathy or radiculopathy (Nerve Damage). Provider Attestation: I, [place] Dossie Arbour, MD, (Pain Management Specialist), the physician/practitioner, attest that I have discussed with the patient the benefits, risks, side effects, alternatives, likelihood of achieving goals and potential problems during recovery for the procedure that I have provided informed consent.  . Provide equipment / supplies at bedside    Equipment required: Single use, disposable, "Block Tray" Needle type: Spinal Amount/quantity: 4 Size: Medium (5-inch) Gauge: 22G    Standing  Status:   Standing    Number of Occurrences:   1    Order Specific Question:   Specify    Answer:   Block Tray   Chronic Opioid Analgesic:  Hydrocodone/APAP 5/325, 1 tab PO q 6 hrs (20 mg/day of hydrocodone) MME/day:20 mg/day.   Medications ordered for procedure: Meds ordered this encounter  Medications  . lidocaine (XYLOCAINE) 2 % (with pres) injection 400 mg  . lactated ringers infusion 1,000 mL  . midazolam (VERSED) 5 MG/5ML injection 1-2 mg    Make sure Flumazenil is available in the pyxis when using this medication. If oversedation occurs, administer 0.2 mg IV over 15 sec. If after 45 sec no response, administer 0.2 mg again over 1 min; may repeat at 1 min intervals; not to exceed 4 doses (1 mg)  . fentaNYL (SUBLIMAZE) injection 25-50 mcg    Make sure Narcan is available in the pyxis when using this medication. In the event of respiratory depression (RR< 8/min): Titrate NARCAN (naloxone) in increments of 0.1 to 0.2 mg IV at 2-3 minute intervals, until desired degree of reversal.  . ropivacaine (PF) 2 mg/mL (0.2%) (NAROPIN) injection 9 mL  . triamcinolone acetonide (KENALOG-40) injection 40 mg  . HYDROcodone-acetaminophen (NORCO/VICODIN) 5-325 MG tablet    Sig: Take 1 tablet by mouth every 6 (six) hours as needed for severe pain. Must last 30 days    Dispense:  120 tablet    Refill:  0    Chronic Pain: STOP Act (Not applicable) Fill 1 day early if closed on refill date. Avoid benzodiazepines within 8 hours of opioids  . HYDROcodone-acetaminophen (NORCO/VICODIN) 5-325 MG tablet    Sig: Take 1 tablet by mouth every 6 (six) hours  as needed for severe pain. Must last 30 days    Dispense:  120 tablet    Refill:  0    Chronic Pain: STOP Act (Not applicable) Fill 1 day early if closed on refill date. Avoid benzodiazepines within 8 hours of opioids  . HYDROcodone-acetaminophen (NORCO/VICODIN) 5-325 MG tablet    Sig: Take 1 tablet by mouth every 6 (six) hours as needed for severe pain.  Must last 30 days    Dispense:  120 tablet    Refill:  0    Chronic Pain: STOP Act (Not applicable) Fill 1 day early if closed on refill date. Avoid benzodiazepines within 8 hours of opioids   Medications administered: We administered lidocaine, lactated ringers, midazolam, fentaNYL, ropivacaine (PF) 2 mg/mL (0.2%), and triamcinolone acetonide.  See the medical record for exact dosing, route, and time of administration.  Follow-up plan:   Return in about 2 weeks (around 04/15/2020) for (VV), (PP) Follow-up.       Considering:  NOTE: NO RFA until BMI <35. Diagnostic/palliative left L4-5 LESI  Diagnostic left L3 TFESI    Palliative PRN treatment(s):  Palliative bilateral lumbar facet block #3  Palliative left IA Hyalgan knee injection #S2/N3 (last series completed 01/15/2018) Palliative left vs bilateral IA knee injection #3 (w/ steroid) (last done 08/15/18)      Recent Visits Date Type Provider Dept  02/04/20 Office Visit Milinda Pointer, MD [facility]  Showing recent visits within past 90 days and meeting all other requirements Today's Visits Date Type Provider Dept  04/01/20 Procedure visit Milinda Pointer, MD [facility]  Showing today's visits and meeting all other requirements Future Appointments Date Type Provider Dept  05/03/20 Appointment Milinda Pointer, MD [facility]  05/19/20 Appointment Milinda Pointer, MD [facility]  Showing future appointments within next 90 days and meeting all other requirements  Disposition: Discharge home  Discharge (Date  Time): 04/01/2020; 0955 hrs.   Primary Care Physician: Colon Branch, MD Location: [facility] Note by: Gaspar Cola, MD Date: 04/01/2020; Time: 11:04 AM  Disclaimer:  Medicine is not an Chief Strategy Officer. The only guarantee in medicine is that nothing is guaranteed. It is important to note that the decision to proceed with this  intervention was based on the information collected from the patient. The Data and conclusions were drawn from the patient's questionnaire, the interview, and the physical examination. Because the information was provided in large part by the patient, it cannot be guaranteed that it has not been purposely or unconsciously manipulated. Every effort has been made to obtain as much relevant data as possible for this evaluation. It is important to note that the conclusions that lead to this procedure are derived in large part from the available data. Always take into account that the treatment will also be dependent on availability of resources and existing treatment guidelines, considered by other Pain Management Practitioners as being common knowledge and practice, at the time of the intervention. For Medico-Legal purposes, it is also important to point out that variation in procedural techniques and pharmacological choices are the acceptable norm. The indications, contraindications, technique, and results of the above procedure should only be interpreted and judged by a Board-Certified Interventional Pain Specialist with extensive familiarity and expertise in the same exact procedure and technique.

## 2020-04-01 ENCOUNTER — Ambulatory Visit (HOSPITAL_BASED_OUTPATIENT_CLINIC_OR_DEPARTMENT_OTHER): Payer: Medicare HMO | Admitting: Pain Medicine

## 2020-04-01 ENCOUNTER — Other Ambulatory Visit: Payer: Self-pay

## 2020-04-01 ENCOUNTER — Ambulatory Visit
Admission: RE | Admit: 2020-04-01 | Discharge: 2020-04-01 | Disposition: A | Discharge status: Home / Self Care | Payer: Medicare HMO | Source: Ambulatory Visit | Attending: Pain Medicine | Admitting: Pain Medicine

## 2020-04-01 ENCOUNTER — Encounter: Payer: Self-pay | Admitting: Pain Medicine

## 2020-04-01 VITALS — BP 137/83 | HR 74 | Temp 97.0°F | Resp 11 | Ht 64.0 in | Wt 236.0 lb

## 2020-04-01 DIAGNOSIS — G894 Chronic pain syndrome: Secondary | ICD-10-CM | POA: Insufficient documentation

## 2020-04-01 DIAGNOSIS — M5137 Other intervertebral disc degeneration, lumbosacral region: Secondary | ICD-10-CM | POA: Insufficient documentation

## 2020-04-01 DIAGNOSIS — M47816 Spondylosis without myelopathy or radiculopathy, lumbar region: Secondary | ICD-10-CM | POA: Diagnosis not present

## 2020-04-01 DIAGNOSIS — G8929 Other chronic pain: Secondary | ICD-10-CM | POA: Insufficient documentation

## 2020-04-01 DIAGNOSIS — M47817 Spondylosis without myelopathy or radiculopathy, lumbosacral region: Secondary | ICD-10-CM | POA: Insufficient documentation

## 2020-04-01 DIAGNOSIS — M545 Low back pain, unspecified: Secondary | ICD-10-CM

## 2020-04-01 DIAGNOSIS — M4310 Spondylolisthesis, site unspecified: Secondary | ICD-10-CM | POA: Diagnosis not present

## 2020-04-01 MED ORDER — FENTANYL CITRATE (PF) 100 MCG/2ML IJ SOLN
25.0000 ug | INTRAMUSCULAR | Status: DC | PRN
  Administered 2020-04-01: 50 ug via INTRAVENOUS
  Filled 2020-04-01: qty 2

## 2020-04-01 MED ORDER — TRIAMCINOLONE ACETONIDE 40 MG/ML IJ SUSP
40.0000 mg | Freq: Once | INTRAMUSCULAR | Status: AC
  Administered 2020-04-01: 40 mg
  Filled 2020-04-01: qty 1

## 2020-04-01 MED ORDER — HYDROCODONE-ACETAMINOPHEN 5-325 MG PO TABS: 1.0000 | ORAL_TABLET | Freq: Four times a day (QID) | ORAL | 0 refills | Status: DC | PRN

## 2020-04-01 MED ORDER — MIDAZOLAM HCL 5 MG/5ML IJ SOLN
1.0000 mg | INTRAMUSCULAR | Status: DC | PRN
  Administered 2020-04-01: 2 mg via INTRAVENOUS
  Filled 2020-04-01: qty 5

## 2020-04-01 MED ORDER — ROPIVACAINE HCL 2 MG/ML IJ SOLN
9.0000 mL | Freq: Once | INTRAMUSCULAR | Status: AC
  Administered 2020-04-01: 9 mL via PERINEURAL
  Filled 2020-04-01: qty 10

## 2020-04-01 MED ORDER — LACTATED RINGERS IV SOLN
1000.0000 mL | Freq: Once | INTRAVENOUS | Status: AC
  Administered 2020-04-01: 1000 mL via INTRAVENOUS

## 2020-04-01 MED ORDER — LIDOCAINE HCL 2 % IJ SOLN
20.0000 mL | Freq: Once | INTRAMUSCULAR | Status: AC
  Administered 2020-04-01: 400 mg

## 2020-04-01 NOTE — Progress Notes (Signed)
Safety precautions to be maintained throughout the outpatient stay will include: orient to surroundings, keep bed in low position, maintain call bell within reach at all times, provide assistance with transfer out of bed and ambulation.  Dr Dossie Arbour notified that patient needed a med refill.

## 2020-04-02 ENCOUNTER — Telehealth: Payer: Self-pay | Admitting: *Deleted

## 2020-04-02 ENCOUNTER — Telehealth: Payer: Self-pay

## 2020-04-02 NOTE — Telephone Encounter (Signed)
Post procedure follow up.  Patient states she is doing good.  

## 2020-04-27 ENCOUNTER — Telehealth: Payer: Medicare HMO | Admitting: Pain Medicine

## 2020-04-28 ENCOUNTER — Other Ambulatory Visit: Payer: Self-pay | Admitting: Internal Medicine

## 2020-05-02 NOTE — Progress Notes (Signed)
NO SHOW

## 2020-05-03 ENCOUNTER — Encounter: Payer: Medicare HMO | Admitting: Pain Medicine

## 2020-05-08 NOTE — Progress Notes (Signed)
PROVIDER NOTE: Information contained herein reflects review and annotations entered in association with encounter. Interpretation of such information and data should be left to medically-trained personnel. Information provided to patient can be located elsewhere in the medical record under "Patient Instructions". Document created using STT-dictation technology, any transcriptional errors that may result from process are unintentional.    Patient: Wanda [name]  Service Category: E/M  Provider: Oswaldo Done, MD  DOB: [birth year]  DOS: 05/10/2020  Specialty: Interventional Pain Management  MRN: [id number]  Setting: Ambulatory outpatient  PCP: Wanda Plump, MD  Type: Established Patient    Referring Provider: Wanda Plump, MD  Location: Office  Delivery: Face-to-face     HPI  [name], a 74 y.o. year old female, is here today because of her Chronic pain syndrome [G89.4]. Ms. Fountaine primary complain today is Back Pain (lumbar right) and Knee Pain (left ) Last encounter: My last encounter with her was on 04/01/2020. Pertinent problems: Ms. 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 with possible L3 nerve root compression); Chronic sacroiliac joint pain (Right); Chronic radicular pain of lower extremity (Left) (S1 dermatome); Osteoarthritis of hips (Bilateral); Chronic knee pain (Primary Area of Pain) (Bilateral) (L>R); Osteoarthritis of knee (Bilateral); Chronic hip pain (Bilateral) (L>R); Chronic low back pain (Bilateral) (L>R) w/ sciatica (Left); Chronic musculoskeletal pain; Abnormal MRI, lumbar spine (03/20/2018); Spondylosis without myelopathy or radiculopathy, lumbosacral region; DDD  (degenerative disc disease), lumbosacral; Chronic low back pain (Bilateral) (R>L) w/o sciatica; Acute exacerbation of chronic low back pain; Chronic knee pain (Left); and Chronic hip pain (Right) on their pertinent problem list. Pain Assessment: Severity of Chronic pain is reported as a 3 /10. Location: Back (left knee) Lower, Right/right back into the right hip. Onset: More than a month ago. Quality: Discomfort, Constant, Aching, Dull. Timing: Constant. Modifying factor(s): rest, medications. Vitals:  height is 5\' 4"  (1.626 m) and weight is 236 lb (107 kg). Her temporal temperature is 97 F (36.1 C) (abnormal). Her blood pressure is 127/77 and her pulse is 69. Her respiration is 16 and oxygen saturation is 100%.   Reason for encounter: both, medication management and post-procedure assessment.  The patient indicates doing well with the current medication regimen. No adverse reactions or side effects reported to the medications.  Upon checking the patient's PMP, it turns out that out of the 3 prescriptions for hydrocodone that I wrote on 04/01/2020, she has had only one filled.  She indicated that she called the pharmacy and daily told her that she had no more "refills". RTCB: 08/29/2020  Post-Procedure Evaluation  Procedure (04/01/2020): Therapeutic right lumbar facet block #3 under fluoroscopy guidance and IV sedation Pre-procedure pain level: 6/10 Post-procedure: 0/10 (100% relief)  Sedation: Sedation provided.  Effectiveness during initial hour after procedure(Ultra-Short Term Relief): 100 %.  Local anesthetic used: Long-acting (4-6 hours) Effectiveness: Defined as any analgesic benefit obtained secondary to the administration of local anesthetics. This carries significant diagnostic value as to the etiological location, or anatomical origin, of the pain. Duration of benefit is expected to coincide with the duration of the local anesthetic used.  Effectiveness during initial 4-6 hours after  procedure(Short-Term Relief): 100 %.  Long-term benefit: Defined as any relief past the pharmacologic duration of the local anesthetics.  Effectiveness past the initial 6 hours after procedure(Long-Term Relief): 50 %.  Current benefits: Defined as benefit that persist at this time.   Analgesia:  50% improved Function: Ms. Wigglesworth reports improvement in function ROM: Ms. Minckler reports improvement in ROM  Pharmacotherapy Assessment   Analgesic: Hydrocodone/APAP 5/325, 1 tab PO q 6 hrs (20 mg/day of hydrocodone) MME/day:20 mg/day.   Monitoring: [place] PMP: PDMP reviewed during this encounter.       Pharmacotherapy: No side-effects or adverse reactions reported. Compliance: No problems identified. Effectiveness: Clinically acceptable.  Janett Billow, RN  05/10/2020  2:10 PM  Sign when Signing Visit Nursing Pain Medication Assessment:  Safety precautions to be maintained throughout the outpatient stay will include: orient to surroundings, keep bed in low position, maintain call bell within reach at all times, provide assistance with transfer out of bed and ambulation.  Medication Inspection Compliance: Pill count conducted under aseptic conditions, in front of the patient. Neither the pills nor the bottle was removed from the patient's sight at any time. Once count was completed pills were immediately returned to the patient in their original bottle.  Medication: Hydrocodone/APAP Pill/Patch Count: 0 of 120 pills remain Pill/Patch Appearance: Markings consistent with prescribed medication Bottle Appearance: Standard pharmacy container. Clearly labeled. Filled Date: 09 / 09 / 2021 Last Medication intake:  Ran out of medicine more than 48 hours ago    UDS:  Summary  Date Value Ref Range Status  02/04/2020 Note  Final    Comment:    ==================================================================== [employer]  (MW) ==================================================================== Test                             Result       Flag       Units  Drug Present and Declared for Prescription Verification   Hydrocodone                    723          EXPECTED   ng/mg creat   Hydromorphone                  138          EXPECTED   ng/mg creat   Dihydrocodeine                 73           EXPECTED   ng/mg creat   Norhydrocodone                 1435         EXPECTED   ng/mg creat    Sources of hydrocodone include scheduled prescription medications.    Hydromorphone, dihydrocodeine and norhydrocodone are expected    metabolites of hydrocodone. Hydromorphone and dihydrocodeine are    also available as scheduled prescription medications.  ==================================================================== Test                      Result    Flag   Units      Ref Range   Creatinine              133              mg/dL      >=20 ==================================================================== Declared Medications:  The flagging and interpretation on this report are based on the  following declared medications.  Unexpected results may arise from  inaccuracies in the declared medications.   **Note: The testing scope of this panel includes these medications:   Hydrocodone (Norco)   **Note: The testing scope of this panel does not include the  following reported medications:   Acetaminophen (Norco)  Aspirin  Atorvastatin (Lipitor)  Calcium  Cyclobenzaprine (Flexeril)  Gabapentin (Neurontin)  Hydrochlorothiazide (Hydrodiuril)  Lisinopril (Zestril)  Sertraline (Zoloft)  Vitamin D  Vitamin D2 (Drisdol) ==================================================================== For clinical consultation, please call [phone]. ====================================================================      ROS  Constitutional: Denies any fever or chills Gastrointestinal: No reported hemesis,  hematochezia, vomiting, or acute GI distress Musculoskeletal: Denies any acute onset joint swelling, redness, loss of ROM, or weakness Neurological: No reported episodes of acute onset apraxia, aphasia, dysarthria, agnosia, amnesia, paralysis, loss of coordination, or loss of consciousness  Medication Review  HYDROcodone-acetaminophen, Vitamin D, Vitamin D (Ergocalciferol), aspirin EC, atorvastatin, calcium carbonate, cyclobenzaprine, gabapentin, hydrochlorothiazide, lisinopril, and sertraline  History Review  Allergy: Ms. Orbach is allergic to simvastatin. Drug: Ms. Henkes  reports no history of drug use. Alcohol:  reports no history of alcohol use. Tobacco:  reports that she has never smoked. She has never used smokeless tobacco. Social: Ms. Pike  reports that she has never smoked. She has never used smokeless tobacco. She reports that she does not drink alcohol and does not use drugs. Medical:  has a past medical history of Allergy, Chronic back pain, Depression, DJD (degenerative joint disease), Hyperlipidemia, Hypertension, Menopause, Musculoskeletal pain (12/07/2015), and TMJ PAIN (05/17/2010). Surgical: Ms. Scheff  has a past surgical history that includes G3 P3 . Family: family history includes Breast cancer in her sister; Cancer in her brother and sister; Colon cancer in her brother; Diabetes in an other family member; Kidney cancer in her son.  Laboratory Chemistry Profile   Renal Lab Results  Component Value Date   BUN 10 01/16/2020   CREATININE 0.66 86/76/1948   BCR NOT APPLICABLE 93/26/7124   GFR 105.13 12/24/2018   GFRAA 103 02/13/2019   GFRNONAA 89 02/13/2019     Hepatic Lab Results  Component Value Date   AST 19 01/16/2020   ALT 22 01/16/2020   ALBUMIN 4.5 02/13/2019   ALKPHOS 87 02/13/2019   HCVAB NEGATIVE 08/30/2016     Electrolytes Lab Results  Component Value Date   NA 138 01/16/2020   K 4.3 01/16/2020   CL 103 01/16/2020   CALCIUM 9.8 01/16/2020    MG 1.8 02/13/2019     Bone Lab Results  Component Value Date   VD25OH 25 (L) 01/16/2020   VD125OH2TOT 68 12/24/2017   [id number] 68 12/24/2017   VD2125OH2 <8 12/24/2017   25OHVITD1 22 (L) 02/13/2019   25OHVITD2 3.4 02/13/2019   25OHVITD3 19 02/13/2019     Inflammation (CRP: Acute Phase) (ESR: Chronic Phase) Lab Results  Component Value Date   CRP 8 02/13/2019   ESRSEDRATE 22 01/16/2020       Note: Above Lab results reviewed.  Recent Imaging Review  DG PAIN CLINIC C-ARM 1-60 MIN NO REPORT Fluoro was used, but no Radiologist interpretation will be provided.  Please refer to "NOTES" tab for provider progress note. Note: Reviewed        Physical Exam  General appearance: Well nourished, well developed, and well hydrated. In no apparent acute distress Mental status: Alert, oriented x 3 (person, place, & time)       Respiratory: No evidence of acute respiratory distress Eyes: PERLA Vitals: BP 127/77 (BP Location: Right Arm, Patient Position: Sitting, Cuff  Size: Large)   Pulse 69   Temp (!) 97 F (36.1 C) (Temporal)   Resp 16   Ht $R'5\' 4"'Hu$  (1.626 m)   Wt 236 lb (107 kg)   SpO2 100%   BMI 40.51 kg/m  BMI: Estimated body mass index is 40.51 kg/m as calculated from the following:   Height as of this encounter: $RemoveBeforeD'5\' 4"'SCRlLtSmWqquzl$  (1.626 m).   Weight as of this encounter: 236 lb (107 kg). Ideal: Ideal body weight: 54.7 kg (120 lb 9.5 oz) Adjusted ideal body weight: 75.6 kg (166 lb 12.1 oz)  Assessment   Status Diagnosis  Controlled Controlled Controlled 1. Chronic pain syndrome   2. Chronic low back pain (Bilateral) (R>L) w/o sciatica   3. Lumbar facet syndrome (Bilateral) (R>L)   4. Chronic knee pain (Left)   5. Chronic musculoskeletal pain   6. Neurogenic pain   7. Fibromyalgia   8. Vitamin D deficiency      Updated Problems: No problems updated.  Plan of Care  Problem-specific:  No problem-specific Assessment & Plan notes found for this encounter.  Ms. CRYSTALE GIANNATTASIO has a current medication list which includes the following long-term medication(s): atorvastatin, calcium carbonate, cyclobenzaprine, gabapentin, hydrochlorothiazide, [START ON 05/31/2020] hydrocodone-acetaminophen, lisinopril, sertraline, and [START ON 07/09/2020] hydrocodone-acetaminophen.  Pharmacotherapy (Medications Ordered): Meds ordered this encounter  Medications  . HYDROcodone-acetaminophen (NORCO/VICODIN) 5-325 MG tablet    Sig: Take 1 tablet by mouth every 6 (six) hours as needed for severe pain. Must last 30 days    Dispense:  120 tablet    Refill:  0    Chronic Pain: STOP Act (Not applicable) Fill 1 day early if closed on refill date. Avoid benzodiazepines within 8 hours of opioids   Orders:  No orders of the defined types were placed in this encounter.  Follow-up plan:   Return in about 3 months (around 08/08/2020) for (F2F), (Med Mgmt).      Considering:  NOTE: NO RFA until BMI <35. Diagnostic/palliative left L4-5 LESI  Diagnostic left L3 TFESI    Palliative PRN treatment(s):  Palliative left lumbar facet block #3  Palliative right lumbar facet block #4  Palliative left IA Hyalgan knee injection #S2/N3 (last series completed 01/15/2018) Palliative left vs bilateral IA knee injection #3 (w/ steroid) (last done 08/15/18)     Recent Visits Date Type Provider Dept  05/10/20 Office Visit Milinda Pointer, MD [facility]  04/01/20 Procedure visit Milinda Pointer, MD [facility]  Showing recent visits within past 90 days and meeting all other requirements Future Appointments Date Type Provider Dept  08/02/20 Appointment Milinda Pointer, MD [facility]  Showing future appointments within next 90 days and meeting all other requirements  I discussed the assessment and treatment plan with the patient. The patient was provided an opportunity to ask questions and all were answered. The patient agreed with the plan and demonstrated an  understanding of the instructions.  Patient advised to call back or seek an in-person evaluation if the symptoms or condition worsens.  Duration of encounter: 30 minutes.  Note by: Gaspar Cola, MD Date: 05/10/2020; Time: 7:36 AM

## 2020-05-10 ENCOUNTER — Ambulatory Visit: Payer: Medicare HMO | Attending: Pain Medicine | Admitting: Pain Medicine

## 2020-05-10 ENCOUNTER — Other Ambulatory Visit: Payer: Self-pay

## 2020-05-10 ENCOUNTER — Encounter: Payer: Self-pay | Admitting: Pain Medicine

## 2020-05-10 VITALS — BP 127/77 | HR 69 | Temp 97.0°F | Resp 16 | Ht 64.0 in | Wt 236.0 lb

## 2020-05-10 DIAGNOSIS — M25562 Pain in left knee: Secondary | ICD-10-CM

## 2020-05-10 DIAGNOSIS — M545 Low back pain, unspecified: Secondary | ICD-10-CM | POA: Diagnosis not present

## 2020-05-10 DIAGNOSIS — E559 Vitamin D deficiency, unspecified: Secondary | ICD-10-CM

## 2020-05-10 DIAGNOSIS — M792 Neuralgia and neuritis, unspecified: Secondary | ICD-10-CM | POA: Insufficient documentation

## 2020-05-10 DIAGNOSIS — G894 Chronic pain syndrome: Secondary | ICD-10-CM | POA: Diagnosis not present

## 2020-05-10 DIAGNOSIS — M7918 Myalgia, other site: Secondary | ICD-10-CM | POA: Insufficient documentation

## 2020-05-10 DIAGNOSIS — M797 Fibromyalgia: Secondary | ICD-10-CM | POA: Diagnosis not present

## 2020-05-10 DIAGNOSIS — G8929 Other chronic pain: Secondary | ICD-10-CM | POA: Diagnosis not present

## 2020-05-10 DIAGNOSIS — M47816 Spondylosis without myelopathy or radiculopathy, lumbar region: Secondary | ICD-10-CM | POA: Insufficient documentation

## 2020-05-10 NOTE — Progress Notes (Signed)
Nursing Pain Medication Assessment:  Safety precautions to be maintained throughout the outpatient stay will include: orient to surroundings, keep bed in low position, maintain call bell within reach at all times, provide assistance with transfer out of bed and ambulation.  Medication Inspection Compliance: Pill count conducted under aseptic conditions, in front of the patient. Neither the pills nor the bottle was removed from the patient's sight at any time. Once count was completed pills were immediately returned to the patient in their original bottle.  Medication: Hydrocodone/APAP Pill/Patch Count: 0 of 120 pills remain Pill/Patch Appearance: Markings consistent with prescribed medication Bottle Appearance: Standard pharmacy container. Clearly labeled. Filled Date: 09 / 09 / 2021 Last Medication intake:  Ran out of medicine more than 48 hours ago

## 2020-05-11 MED ORDER — HYDROCODONE-ACETAMINOPHEN 5-325 MG PO TABS: 1.0000 | ORAL_TABLET | Freq: Four times a day (QID) | ORAL | 0 refills | Status: DC | PRN

## 2020-05-19 ENCOUNTER — Telehealth: Payer: Medicare HMO | Admitting: Pain Medicine

## 2020-06-01 ENCOUNTER — Other Ambulatory Visit: Payer: Self-pay | Admitting: Internal Medicine

## 2020-06-12 ENCOUNTER — Other Ambulatory Visit: Payer: Self-pay | Admitting: Internal Medicine

## 2020-06-28 ENCOUNTER — Encounter: Payer: Medicare HMO | Admitting: Pain Medicine

## 2020-08-01 NOTE — Progress Notes (Deleted)
No show

## 2020-08-02 ENCOUNTER — Encounter: Payer: Medicare HMO | Admitting: Pain Medicine

## 2020-08-02 DIAGNOSIS — F1120 Opioid dependence, uncomplicated: Secondary | ICD-10-CM | POA: Insufficient documentation

## 2020-08-04 ENCOUNTER — Encounter: Payer: Medicare HMO | Admitting: Pain Medicine

## 2020-08-04 ENCOUNTER — Telehealth: Payer: Self-pay | Admitting: *Deleted

## 2020-08-04 DIAGNOSIS — M797 Fibromyalgia: Secondary | ICD-10-CM | POA: Insufficient documentation

## 2020-08-04 NOTE — Telephone Encounter (Signed)
Spoke with Mrs. Dahir regarding cancels and no shows. She is understanding and will try to decrease. States her husband is having health issues and she has to take care of him.

## 2020-08-04 NOTE — Progress Notes (Deleted)
No show

## 2020-08-10 NOTE — Progress Notes (Signed)
PROVIDER NOTE: Information contained herein reflects review and annotations entered in association with encounter. Interpretation of such information and data should be left to medically-trained personnel. Information provided to patient can be located elsewhere in the medical record under "Patient Instructions". Document created using STT-dictation technology, any transcriptional errors that may result from process are unintentional.    Patient: Wanda [name]  Service Category: E/M  Provider: Gaspar Cola, MD  DOB: [birth year]  DOS: 08/11/2020  Specialty: Interventional Pain Management  MRN: [id number]  Setting: Ambulatory outpatient  PCP: Colon Branch, MD  Type: Established Patient    Referring Provider: Colon Branch, MD  Location: Office  Delivery: Face-to-face     HPI  [name], a 75 y.o. year old female, is here today because of her Chronic pain syndrome [G89.4]. Ms. Panchal primary complain today is Knee Pain (Left knee) Last encounter: My last encounter with her was on 08/04/2020. Pertinent problems: Ms. 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 with possible L3 nerve root compression); Chronic sacroiliac joint pain (Right); Chronic radicular pain of lower extremity (Left) (S1 dermatome); Osteoarthritis of hips (Bilateral); Chronic knee pain (Primary Area of Pain) (Bilateral) (L>R); Osteoarthritis of knee (Bilateral); Chronic hip pain (Bilateral) (L>R); Chronic low back pain (Bilateral) (L>R) w/ sciatica (Left); Chronic musculoskeletal pain; Abnormal MRI, lumbar spine (03/20/2018); Spondylosis without myelopathy or radiculopathy, lumbosacral region; DDD (degenerative disc disease),  lumbosacral; Chronic low back pain (Bilateral) (R>L) w/o sciatica; Chronic knee pain (Left); Chronic hip pain (Right); Fibromyalgia; Tricompartment osteoarthritis of left knee; and Osteoarthritis of knee (Left) on their pertinent problem list. Pain Assessment: Severity of Chronic pain is reported as a 6 /10. Location: Knee Left/pain radiaties down to back of her calf. Onset: More than a month ago. Quality: Burning,Shooting,Throbbing,Aching. Timing: Constant. Modifying factor(s): meds and elevate legs. Vitals:  height is $RemoveB'5\' 4"'ppslnbbR$  (1.626 m) and weight is 235 lb (106.6 kg). Her temperature is 97.1 F (36.2 C) (abnormal). Her blood pressure is 139/81 and her pulse is 82. Her oxygen saturation is 98%.   Reason for encounter: medication management.   The patient indicates doing well with the current medication regimen. No adverse reactions or side effects reported to the medications.  Today I provided the patient with some information regarding virtual visit so that if by any chance she is unable to make it to 1 appointment, she can at least switch to a virtual visit rather than not showing to the appointment.  Today she indicated having more pain on the left knee and we went over the results of her recent x-rays.  These x-rays confirm worsening of the tricompartmental osteoarthritis of her knee.  In the past we had done Hyalgan knee injections with excellent results on the right knee to the point where she is currently not having any pain on that one.  The last time that we did a series of Hyalgan knee injections on the left side was on June of 2019.  She denies any instability of the knee but would like to go ahead and repeat a series of Hyalgan knee injections.  Today I have explained to the patient that if the injections fail to provide her with relief of the pain this which is just that her cartilage is  completely gone and therefore an MRI would be needed.  If the MRI confirms our suspicions about the loss of  cartilage, then the neck step would be a total knee replacement.  Understandably, she wants to avoid this, but I have explained to the patient that it may become impossible to do so.  I also reminded the patient about the pathophysiological mechanism responsible for their osteoarthritis and how it is entirely dependent on her obesity.  I have reminded her that bringing her weight down to a BMI of 30 would probably extend the life of her joints.  She has also been experiencing some pain in the area of the left hip which make me think that she is beginning to have problems with that joint as well.  Unfortunately her weight remains at 235 pounds with a BMI of 40.34 kg/m.  She came into the clinic today ambulating with the use of an assistive device (cane).  RTCB: 11/09/2020 Nonopioids transferred 05/03/2020: Flexeril, Neurontin, and calcium  Pharmacotherapy Assessment   Analgesic: Hydrocodone/APAP 5/325, 1 tab PO q 6 hrs (20 mg/day of hydrocodone) MME/day:20 mg/day.   Monitoring: [place] PMP: PDMP reviewed during this encounter.       Pharmacotherapy: No side-effects or adverse reactions reported. Compliance: No problems identified. Effectiveness: Clinically acceptable.  Chauncey Fischer, RN  08/11/2020 11:21 AM  Sign when Signing Visit Nursing Pain Medication Assessment:  Safety precautions to be maintained throughout the outpatient stay will include: orient to surroundings, keep bed in low position, maintain call bell within reach at all times, provide assistance with transfer out of bed and ambulation.  Medication Inspection Compliance: Pill count conducted under aseptic conditions, in front of the patient. Neither the pills nor the bottle was removed from the patient's sight at any time. Once count was completed pills were immediately returned to the patient in their original bottle.  Medication: Hydrocodone/APAP Pill/Patch Count: 0 of 120 pills remain Pill/Patch Appearance: Markings consistent  with prescribed medication Bottle Appearance: Standard pharmacy container. Clearly labeled. Filled Date: 50 / 57 / 21 Last Medication intake:  TodaySafety precautions to be maintained throughout the outpatient stay will include: orient to surroundings, keep bed in low position, maintain call bell within reach at all times, provide assistance with transfer out of bed and ambulation.     UDS:  Summary  Date Value Ref Range Status  02/04/2020 Note  Final    Comment:    ==================================================================== [employer] [facility] 13 (MW) ==================================================================== Test                             Result       Flag       Units  Drug Present and Declared for Prescription Verification   Hydrocodone                    723          EXPECTED   ng/mg creat   Hydromorphone                  138          EXPECTED   ng/mg creat   Dihydrocodeine                 73           EXPECTED   ng/mg creat   Norhydrocodone  1435         EXPECTED   ng/mg creat    Sources of hydrocodone include scheduled prescription medications.    Hydromorphone, dihydrocodeine and norhydrocodone are expected    metabolites of hydrocodone. Hydromorphone and dihydrocodeine are    also available as scheduled prescription medications.  ==================================================================== Test                      Result    Flag   Units      Ref Range   Creatinine              133              mg/dL      >=73 ==================================================================== Declared Medications:  The flagging and interpretation on this report are based on the  following declared medications.  Unexpected results may arise from  inaccuracies in the declared medications.   **Note: The testing scope of this panel includes these medications:   Hydrocodone (Norco)   **Note: The testing scope of this panel does not include the   following reported medications:   Acetaminophen (Norco)  Aspirin  Atorvastatin (Lipitor)  Calcium  Cyclobenzaprine (Flexeril)  Gabapentin (Neurontin)  Hydrochlorothiazide (Hydrodiuril)  Lisinopril (Zestril)  Sertraline (Zoloft)  Vitamin D  Vitamin D2 (Drisdol) ==================================================================== For clinical consultation, please call [phone]. ====================================================================      ROS  Constitutional: Denies any fever or chills Gastrointestinal: No reported hemesis, hematochezia, vomiting, or acute GI distress Musculoskeletal: Denies any acute onset joint swelling, redness, loss of ROM, or weakness Neurological: No reported episodes of acute onset apraxia, aphasia, dysarthria, agnosia, amnesia, paralysis, loss of coordination, or loss of consciousness  Medication Review  HYDROcodone-acetaminophen, Vitamin D, Vitamin D (Ergocalciferol), aspirin EC, atorvastatin, calcium carbonate, cyclobenzaprine, gabapentin, hydrochlorothiazide, lisinopril, and sertraline  History Review  Allergy: Ms. Matthew is allergic to simvastatin. Drug: Ms. Kosel  reports no history of drug use. Alcohol:  reports no history of alcohol use. Tobacco:  reports that she has never smoked. She has never used smokeless tobacco. Social: Ms. Diemer  reports that she has never smoked. She has never used smokeless tobacco. She reports that she does not drink alcohol and does not use drugs. Medical:  has a past medical history of Allergy, Chronic back pain, Depression, DJD (degenerative joint disease), Hyperlipidemia, Hypertension, Menopause, Musculoskeletal pain (12/07/2015), and TMJ PAIN (05/17/2010). Surgical: Ms. Hughston  has a past surgical history that includes G3 P3 . Family: family history includes Breast cancer in her sister; Cancer in her brother and sister; Colon cancer in her brother; Diabetes in an other family member; Kidney  cancer in her son.  Laboratory Chemistry Profile   Renal Lab Results  Component Value Date   BUN 10 01/16/2020   CREATININE 0.66 01/16/2020   BCR NOT APPLICABLE 01/16/2020   GFR 105.13 12/24/2018   GFRAA 103 02/13/2019   GFRNONAA 89 02/13/2019     Hepatic Lab Results  Component Value Date   AST 19 01/16/2020   ALT 22 01/16/2020   ALBUMIN 4.5 02/13/2019   ALKPHOS 87 02/13/2019   HCVAB NEGATIVE 08/30/2016     Electrolytes Lab Results  Component Value Date   NA 138 01/16/2020   K 4.3 01/16/2020   CL 103 01/16/2020   CALCIUM 9.8 01/16/2020   MG 1.8 02/13/2019     Bone Lab Results  Component Value Date   VD25OH 25 (L) 01/16/2020   [id number] 68 12/24/2017   [id number] 68 12/24/2017  [id number] <8 12/24/2017   25OHVITD1 22 (L) 02/13/2019   25OHVITD2 3.4 02/13/2019   25OHVITD3 19 02/13/2019     Inflammation (CRP: Acute Phase) (ESR: Chronic Phase) Lab Results  Component Value Date   CRP 8 02/13/2019   ESRSEDRATE 22 01/16/2020       Note: Above Lab results reviewed.  Recent Imaging Review  DG PAIN CLINIC C-ARM 1-60 MIN NO REPORT Fluoro was used, but no Radiologist interpretation will be provided.  Please refer to "NOTES" tab for provider progress note. Note: Reviewed        Physical Exam  General appearance: Well nourished, well developed, and well hydrated. In no apparent acute distress Mental status: Alert, oriented x 3 (person, place, & time)       Respiratory: No evidence of acute respiratory distress Eyes: PERLA Vitals: BP 139/81    Pulse 82    Temp (!) 97.1 F (36.2 C)    Ht $R'5\' 4"'SS$  (1.626 m)    Wt 235 lb (106.6 kg)    SpO2 98%    BMI 40.34 kg/m  BMI: Estimated body mass index is 40.34 kg/m as calculated from the following:   Height as of this encounter: $RemoveBeforeD'5\' 4"'[id number]$  (1.626 m).   Weight as of this encounter: 235 lb (106.6 kg). Ideal: Ideal body weight: 54.7 kg (120 lb 9.5 oz) Adjusted ideal body weight: 75.5 kg (166 lb 5.7 oz)  Assessment   Status  Diagnosis  Controlled Controlled Controlled 1. Chronic pain syndrome   2. Chronic low back pain (Bilateral) (R>L) w/o sciatica   3. Lumbar facet syndrome (Bilateral) (R>L)   4. Chronic knee pain (Left)   5. Grade 1 (31mm) Anterolisthesis of L3 over L4.   6. Fibromyalgia   7. Pharmacologic therapy   8. Uncomplicated opioid dependence ([place])   9. Tricompartment osteoarthritis of left knee   10. Osteoarthritis of knee (Left)      Updated Problems: Problem  Tricompartment Osteoarthritis of Left Knee  Osteoarthritis of knee (Left)    Plan of Care  Problem-specific:  No problem-specific Assessment & Plan notes found for this encounter.  Ms. ZAHARA REMBERT has a current medication list which includes the following long-term medication(s): atorvastatin, calcium carbonate, hydrochlorothiazide, lisinopril, sertraline, cyclobenzaprine, gabapentin, hydrocodone-acetaminophen, [START ON 09/10/2020] hydrocodone-acetaminophen, and [START ON 10/10/2020] hydrocodone-acetaminophen.  Pharmacotherapy (Medications Ordered): Meds ordered this encounter  Medications   HYDROcodone-acetaminophen (NORCO/VICODIN) 5-325 MG tablet    Sig: Take 1 tablet by mouth every 6 (six) hours as needed for severe pain. Must last 30 days    Dispense:  120 tablet    Refill:  0    Chronic Pain: STOP Act (Not applicable) Fill 1 day early if closed on refill date. Avoid benzodiazepines within 8 hours of opioids   HYDROcodone-acetaminophen (NORCO/VICODIN) 5-325 MG tablet    Sig: Take 1 tablet by mouth every 6 (six) hours as needed for severe pain. Must last 30 days    Dispense:  120 tablet    Refill:  0    Chronic Pain: STOP Act (Not applicable) Fill 1 day early if closed on refill date. Avoid benzodiazepines within 8 hours of opioids   HYDROcodone-acetaminophen (NORCO/VICODIN) 5-325 MG tablet    Sig: Take 1 tablet by mouth every 6 (six) hours as needed for severe pain. Must last 30 days    Dispense:  120 tablet     Refill:  0    Chronic Pain: STOP Act (Not applicable) Fill 1 day early if closed on refill  date. Avoid benzodiazepines within 8 hours of opioids   Orders:  Orders Placed This Encounter  Procedures   KNEE INJECTION    Hyalgan knee injection. Please order Hyalgan.    Standing Status:   Future    Standing Expiration Date:   09/11/2020    Scheduling Instructions:     Procedure: Intra-articular Hyalgan Knee injection #1     Side: Left-sided     Sedation: None     Timeframe: in two (2) weeks    Order Specific Question:   Where will this procedure be performed?    Answer:   [facility]   Follow-up plan:   Return for Procedure (no sedation): (L) Hyalgan #1.      Interventional Therapies  Risk   Complexity Considerations:   NOTE: NO LUMBAR RFA until BMI <35.   Planned   Pending:   Pending further evaluation   Under consideration:   Diagnostic/palliative left L4-5 LESI  Diagnostic left L3 TFESI    Completed:   None at this time   Therapeutic   Palliative (PRN) options:   Palliative left lumbar facet block #3  Palliative right lumbar facet block #4  Palliative left IA Hyalgan knee injection #S2/N3 (last series completed 01/15/2018) Palliative left vs bilateral IA knee injection #3 (w/ steroid) (last done 08/15/18)     Recent Visits No visits were found meeting these conditions. Showing recent visits within past 90 days and meeting all other requirements Today's Visits Date Type Provider Dept  08/11/20 Office Visit Milinda Pointer, MD [facility]  Showing today's visits and meeting all other requirements Future Appointments No visits were found meeting these conditions. Showing future appointments within next 90 days and meeting all other requirements  I discussed the assessment and treatment plan with the patient. The patient was provided an opportunity to ask questions and all were answered. The patient agreed with the plan and demonstrated an  understanding of the instructions.  Patient advised to call back or seek an in-person evaluation if the symptoms or condition worsens.  Duration of encounter: 30 minutes.  Note by: Gaspar Cola, MD Date: 08/11/2020; Time: 12:08 PM

## 2020-08-11 ENCOUNTER — Ambulatory Visit: Payer: Medicare HMO | Attending: Pain Medicine | Admitting: Pain Medicine

## 2020-08-11 ENCOUNTER — Other Ambulatory Visit: Payer: Self-pay

## 2020-08-11 ENCOUNTER — Encounter: Payer: Self-pay | Admitting: Pain Medicine

## 2020-08-11 ENCOUNTER — Other Ambulatory Visit: Payer: Self-pay | Admitting: Internal Medicine

## 2020-08-11 VITALS — BP 139/81 | HR 82 | Temp 97.1°F | Ht 64.0 in | Wt 235.0 lb

## 2020-08-11 DIAGNOSIS — M47816 Spondylosis without myelopathy or radiculopathy, lumbar region: Secondary | ICD-10-CM | POA: Insufficient documentation

## 2020-08-11 DIAGNOSIS — M545 Low back pain, unspecified: Secondary | ICD-10-CM | POA: Diagnosis not present

## 2020-08-11 DIAGNOSIS — M4310 Spondylolisthesis, site unspecified: Secondary | ICD-10-CM

## 2020-08-11 DIAGNOSIS — G894 Chronic pain syndrome: Secondary | ICD-10-CM | POA: Insufficient documentation

## 2020-08-11 DIAGNOSIS — Z79899 Other long term (current) drug therapy: Secondary | ICD-10-CM

## 2020-08-11 DIAGNOSIS — F1120 Opioid dependence, uncomplicated: Secondary | ICD-10-CM | POA: Diagnosis not present

## 2020-08-11 DIAGNOSIS — G8929 Other chronic pain: Secondary | ICD-10-CM | POA: Diagnosis not present

## 2020-08-11 DIAGNOSIS — M1712 Unilateral primary osteoarthritis, left knee: Secondary | ICD-10-CM | POA: Diagnosis not present

## 2020-08-11 DIAGNOSIS — M25562 Pain in left knee: Secondary | ICD-10-CM | POA: Diagnosis not present

## 2020-08-11 DIAGNOSIS — M797 Fibromyalgia: Secondary | ICD-10-CM

## 2020-08-11 MED ORDER — HYDROCODONE-ACETAMINOPHEN 5-325 MG PO TABS: 1.0000 | ORAL_TABLET | Freq: Four times a day (QID) | ORAL | 0 refills | Status: DC | PRN

## 2020-08-11 NOTE — Patient Instructions (Addendum)
____________________________________________________________________________________________  Preparing for your procedure (without sedation)  Procedure appointments are limited to planned procedures: . No Prescription Refills. . No disability issues will be discussed. . No medication changes will be discussed.  Instructions: . Oral Intake: Do not eat or drink anything for at least 6 hours prior to your procedure. (Exception: Blood Pressure Medication. See below.) . Transportation: Unless otherwise stated by your physician, you may drive yourself after the procedure. . Blood Pressure Medicine: Do not forget to take your blood pressure medicine with a sip of water the morning of the procedure. If your Diastolic (lower reading)is above 100 mmHg, elective cases will be cancelled/rescheduled. . Blood thinners: These will need to be stopped for procedures. Notify our staff if you are taking any blood thinners. Depending on which one you take, there will be specific instructions on how and when to stop it. . Diabetics on insulin: Notify the staff so that you can be scheduled 1st case in the morning. If your diabetes requires high dose insulin, take only  of your normal insulin dose the morning of the procedure and notify the staff that you have done so. . Preventing infections: Shower with an antibacterial soap the morning of your procedure.  . Build-up your immune system: Take 1000 mg of Vitamin C with every meal (3 times a day) the day prior to your procedure. [name] Antibiotics: Inform the staff if you have a condition or reason that requires you to take antibiotics before dental procedures. . Pregnancy: If you are pregnant, call and cancel the procedure. . Sickness: If you have a cold, fever, or any active infections, call and cancel the procedure. . Arrival: You must be in the facility at least 30 minutes prior to your scheduled procedure. . Children: Do not bring any children with you. . Dress  appropriately: Bring dark clothing that you would not mind if they get stained. . Valuables: Do not bring any jewelry or valuables.  Reasons to call and reschedule or cancel your procedure: (Following these recommendations will minimize the risk of a serious complication.) . Surgeries: Avoid having procedures within 2 weeks of any surgery. (Avoid for 2 weeks before or after any surgery). . Flu Shots: Avoid having procedures within 2 weeks of a flu shots or . (Avoid for 2 weeks before or after immunizations). . Barium: Avoid having a procedure within 7-10 days after having had a radiological study involving the use of radiological contrast. (Myelograms, Barium swallow or enema study). . Heart attacks: Avoid any elective procedures or surgeries for the initial 6 months after a "Myocardial Infarction" (Heart Attack). . Blood thinners: It is imperative that you stop these medications before procedures. Let us know if you if you take any blood thinner.  . Infection: Avoid procedures during or within two weeks of an infection (including chest colds or gastrointestinal problems). Symptoms associated with infections include: Localized redness, fever, chills, night sweats or profuse sweating, burning sensation when voiding, cough, congestion, stuffiness, runny nose, sore throat, diarrhea, nausea, vomiting, cold or Flu symptoms, recent or current infections. It is specially important if the infection is over the area that we intend to treat. [name] Heart and lung problems: Symptoms that may suggest an active cardiopulmonary problem include: cough, chest pain, breathing difficulties or shortness of breath, dizziness, ankle swelling, uncontrolled high or unusually low blood pressure, and/or palpitations. If you are experiencing any of these symptoms, cancel your procedure and contact your primary care physician for an evaluation.  Remember:  Regular  Business hours are:  Monday to Thursday 8:00 AM to 4:00  PM  Provider's Schedule: Milinda Pointer, MD:  Procedure days: Tuesday and Thursday 7:30 AM to 4:00 PM  Gillis Santa, MD:  Procedure days: Monday and Wednesday 7:30 AM to 4:00 PM ____________________________________________________________________________________________    ______________________________________________________________________________________________  Body mass index (BMI)  Body mass index (BMI) is a common tool for deciding whether a person has an appropriate body weight.  It measures a persons weight in relation to their height.   According to the [employer] of health (NIH): [name] A BMI of less than 18.5 means that a person is underweight. . A BMI of between 18.5 and 24.9 is ideal. . A BMI of between 25 and 29.9 is overweight. . A BMI over 30 indicates obesity.  Weight Management Required  URGENT: Your weight has been found to be adversely affecting your health.  Dear Ms. Welliver:  Your current Estimated body mass index is 40.34 kg/m as calculated from the following:   Height as of this encounter: _0  (1.626 m).   Weight as of this encounter: 235 lb (106.6 kg).  Please use the table below to identify your weight category and associated incidence of chronic pain, secondary to your weight.  Body Mass Index (BMI) Classification BMI level (kg/m2) Category Associated incidence of chronic pain  <18  Underweight   18.5-24.9 Ideal body weight   25-29.9 Overweight  20%  30-34.9 Obese (Class I)  68%  35-39.9 Severe obesity (Class II)  136%  >40 Extreme obesity (Class III)  254%   In addition: You will be considered "Morbidly Obese", if your BMI is above 30 and you have one or more of the following conditions which are known to be caused and/or directly associated with obesity: 1.    Type 2 Diabetes (Which in turn can lead to cardiovascular diseases (CVD), stroke, peripheral vascular diseases (PVD), retinopathy, nephropathy, and neuropathy) 2.     Cardiovascular Disease (High Blood Pressure; Congestive Heart Failure; High Cholesterol; Coronary Artery Disease; Angina; or History of Heart Attacks) 3.    Breathing problems (Asthma; obesity-hypoventilation syndrome; obstructive sleep apnea; chronic inflammatory airway disease; reactive airway disease; or shortness of breath) 4.    Chronic kidney disease 5.    Liver disease (nonalcoholic fatty liver disease) 6.    High blood pressure 7.    Acid reflux (gastroesophageal reflux disease; heartburn) 8.    Osteoarthritis (OA) (with any of the following: hip pain; knee pain; and/or low back pain) 9.    Low back pain (Lumbar Facet Syndrome; and/or Degenerative Disc Disease) 10.  Hip pain (Osteoarthritis of hip) (For every 1 lbs of added body weight, there is a 2 lbs increase in pressure inside of each hip articulation. 1:2 mechanical relationship) 11.  Knee pain (Osteoarthritis of knee) (For every 1 lbs of added body weight, there is a 4 lbs increase in pressure inside of each knee articulation. 1:4 mechanical relationship) (patients with a BMI>30 kg/m2 were 6.8 times more likely to develop knee OA than normal-weight individuals) 12.  Cancer: Epidemiological studies have shown that obesity is a risk factor for: post-menopausal breast cancer; cancers of the endometrium, colon and kidney cancer; malignant adenomas of the oesophagus. Obese subjects have an approximately 1.5-3.5-fold increased risk of developing these cancers compared with normal-weight subjects, and it has been estimated that between 15 and 45% of these cancers can be attributed to overweight. More recent studies suggest that obesity may also increase the risk of other  types of cancer, including pancreatic, hepatic and gallbladder cancer. (Ref: Obesity and cancer. Pischon T, Nthlings U, [employer] H. Proc Nutr Soc. 2008 May;67(2):128-45. doi: 91.7915/[id number].) The International Agency for Research on Cancer (IARC) has identified 13 cancers  associated with overweight and obesity: meningioma, multiple myeloma, adenocarcinoma of the esophagus, and cancers of the thyroid, postmenopausal breast cancer, gallbladder, stomach, liver, pancreas, kidney, ovaries, uterus, colon and rectal (colorectal) cancers. 46 percent of all cancers diagnosed in women and 24 percent of those diagnosed in men are associated with overweight and obesity.  Recommendation: At this point it is urgent that you take a step back and concentrate in loosing weight. Dedicate 100% of your efforts on this task. Nothing else will improve your health more than bringing your weight down and your BMI to less than 30. If you are here, you probably have chronic pain. We know that most chronic pain patients have difficulty exercising secondary to their pain. For this reason, you must rely on proper nutrition and diet in order to lose the weight. If your BMI is above 40, you should seriously consider bariatric surgery. A realistic goal is to lose 10% of your body weight over a period of 12 months.  Be honest to yourself, if over time you have unsuccessfully tried to lose weight, then it is time for you to seek professional help and to enter a medically supervised weight management program, and/or undergo bariatric surgery. Stop procrastinating.   Pain management considerations:  1.    Pharmacological Problems: Be advised that the use of opioid analgesics (oxycodone; hydrocodone; morphine; methadone; codeine; and all of their derivatives) have been associated with decreased metabolism and weight gain.  For this reason, should we see that you are unable to lose weight while taking these medications, it may become necessary for [place] to taper down and indefinitely discontinue them.  2.    Technical Problems: The incidence of successful interventional therapies decreases as the patient's BMI increases. It is much more difficult to accomplish a safe and effective interventional therapy on a patient  with a BMI above 35. 3.    Radiation Exposure Problems: The x-rays machine, used to accomplish injection therapies, will automatically increase their x-ray output in order to capture an appropriate bone image. This means that radiation exposure increases exponentially with the patient's BMI. (The higher the BMI, the higher the radiation exposure.) Although the level of radiation used at a given time is still safe to the patient, it is not for the physician and/or assisting staff. Unfortunately, radiation exposure is accumulative. Because physicians and the staff have to do procedures and be exposed on a daily basis, this can result in health problems such as cancer and radiation burns. Radiation exposure to the staff is monitored by the radiation batches that they wear. The exposure levels are reported back to the staff on a quarterly basis. Depending on levels of exposure, physicians and staff may be obligated by law to decrease this exposure. This means that they have the right and obligation to refuse providing therapies where they may be overexposed to radiation. For this reason, physicians may decline to offer therapies such as radiofrequency ablation or implants to patients with a BMI above 40. 4.    Current Trends: Be advised that the current trend is to no longer offer certain therapies to patients with a BMI equal to, or above 35, due to increase perioperative risks, increased technical procedural difficulties, and excessive radiation exposure to healthcare personnel.  ______________________________________________________________________________________________  ____________________________________________________________________________________________  Virtual Visits   What is a "Virtual Visit"? It is a Metallurgist (medical visit) that takes place on real time (NOT TEXT or E-MAIL) over the telephone or computer device (desktop, laptop, tablet, smart phone, etc.). It  allows for more location flexibility between the patient and the healthcare provider.  Who decides when these types of visits will be used? The physician.  Who is eligible for these types of visits? Only those patients that can be reliably reached over the telephone.  What do you mean by reliably? We do not have time to call everyone multiple times, therefore those that tend to screen calls and then call back later are not suitable candidates for this system. We understand how people are reluctant to pickup on "unknown" calls, therefore, we suggest adding our telephone numbers to your list of "CONTACT(s)". This way, you should be able to readily identify our calls when you receive one. All of our numbers are available below.   Who is not eligible? This option is not available for medication management encounters, specially for controlled substances. Patients on pain medications that fall under the category of controlled substances have to come in for "Face-to-Face" encounters. This is required for mandatory monitoring of these substances. You may be asked to provide a sample for an unannounced urine drug screening test (UDS), and we will need to count your pain pills. Not bringing your pills to be counted may result in no refill. Obviously, neither one of these can be done over the phone.  When will this type of visits be used? You can request a virtual visit whenever you are physically unable to attend a regular appointment. The decision will be made by the physician (or healthcare provider) on a case by case basis.   At what time will I be called? This is an excellent question. The providers will try to call you whenever they have time available. Do not expect to be called at any specific time. The secretaries will assign you a time for your virtual visit appointment, but this is done simply to keep a list of those patients that need to be called, but not for the purpose of keeping a time  schedule. Be advised that the call may come in anytime during the day, between the hours of 8:00 AM and 8::00 PM, depending on provider availability. We do understand that the system is not perfect. If you are unable to be available that day on a moments notice, then request an "in-person" appointment rather than a "virtual visit".  Can I request my medication visits to be "Virtual"? Yes you may request it, but the decision is entirely up to the healthcare provider. Control substances require specific monitoring that requires Face-to-Face encounters. The number of encounters  and the extent of the monitoring is determined on a case by case basis.  Add a new contact to your smart phone and label it "PAIN CLINIC" Under this contact add the following numbers: Main: [phone] (Official Contact Number) Nurses: [phone] (These are outgoing only calling systems. Do not call this number.) Dr. Dossie Arbour: [phone] or [phone] (Outgoing calls only. Do not call this number.)  ____________________________________________________________________________________________

## 2020-08-11 NOTE — Progress Notes (Signed)
Nursing Pain Medication Assessment:  Safety precautions to be maintained throughout the outpatient stay will include: orient to surroundings, keep bed in low position, maintain call bell within reach at all times, provide assistance with transfer out of bed and ambulation.  Medication Inspection Compliance: Pill count conducted under aseptic conditions, in front of the patient. Neither the pills nor the bottle was removed from the patient's sight at any time. Once count was completed pills were immediately returned to the patient in their original bottle.  Medication: Hydrocodone/APAP Pill/Patch Count: 0 of 120 pills remain Pill/Patch Appearance: Markings consistent with prescribed medication Bottle Appearance: Standard pharmacy container. Clearly labeled. Filled Date: 57 / 88 / 21 Last Medication intake:  TodaySafety precautions to be maintained throughout the outpatient stay will include: orient to surroundings, keep bed in low position, maintain call bell within reach at all times, provide assistance with transfer out of bed and ambulation.

## 2020-08-12 ENCOUNTER — Other Ambulatory Visit: Payer: Self-pay | Admitting: Internal Medicine

## 2020-08-23 ENCOUNTER — Other Ambulatory Visit: Payer: Self-pay | Admitting: Pain Medicine

## 2020-08-23 DIAGNOSIS — M7918 Myalgia, other site: Secondary | ICD-10-CM

## 2020-08-23 DIAGNOSIS — G8929 Other chronic pain: Secondary | ICD-10-CM

## 2020-09-09 ENCOUNTER — Other Ambulatory Visit: Payer: Self-pay

## 2020-09-09 ENCOUNTER — Ambulatory Visit: Payer: Medicare HMO | Attending: Pain Medicine | Admitting: Pain Medicine

## 2020-09-09 ENCOUNTER — Encounter: Payer: Self-pay | Admitting: Pain Medicine

## 2020-09-09 VITALS — BP 129/91 | HR 82 | Temp 96.2°F | Resp 98 | Ht 64.0 in | Wt 230.0 lb

## 2020-09-09 DIAGNOSIS — Z6841 Body Mass Index (BMI) 40.0 and over, adult: Secondary | ICD-10-CM | POA: Diagnosis not present

## 2020-09-09 DIAGNOSIS — M1712 Unilateral primary osteoarthritis, left knee: Secondary | ICD-10-CM | POA: Insufficient documentation

## 2020-09-09 DIAGNOSIS — M25562 Pain in left knee: Secondary | ICD-10-CM | POA: Insufficient documentation

## 2020-09-09 DIAGNOSIS — G8929 Other chronic pain: Secondary | ICD-10-CM | POA: Diagnosis not present

## 2020-09-09 MED ORDER — LIDOCAINE HCL (PF) 1 % IJ SOLN
5.0000 mL | Freq: Once | INTRAMUSCULAR | Status: AC
  Administered 2020-09-09: 5 mL

## 2020-09-09 MED ORDER — ROPIVACAINE HCL 2 MG/ML IJ SOLN
INTRAMUSCULAR | Status: AC
  Filled 2020-09-09: qty 10

## 2020-09-09 MED ORDER — ROPIVACAINE HCL 2 MG/ML IJ SOLN
5.0000 mL | Freq: Once | INTRAMUSCULAR | Status: AC
  Administered 2020-09-09: 10 mL via INTRA_ARTICULAR

## 2020-09-09 MED ORDER — LIDOCAINE HCL (PF) 1 % IJ SOLN
INTRAMUSCULAR | Status: AC
  Filled 2020-09-09: qty 5

## 2020-09-09 MED ORDER — SODIUM HYALURONATE (VISCOSUP) 20 MG/2ML IX SOSY: 2.0000 mL | PREFILLED_SYRINGE | Freq: Once | INTRA_ARTICULAR | Status: AC

## 2020-09-09 NOTE — Progress Notes (Signed)
PROVIDER NOTE: Information contained herein reflects review and annotations entered in association with encounter. Interpretation of such information and data should be left to medically-trained personnel. Information provided to patient can be located elsewhere in the medical record under "Patient Instructions". Document created using STT-dictation technology, any transcriptional errors that may result from process are unintentional.    Patient: Wanda [name]  Service Category: Procedure  Provider: Gaspar Cola, MD  DOB: [birth year]  DOS: 09/09/2020  Location: [facility]  MRN: [id number]  Setting: Ambulatory - outpatient  Referring Provider: Colon Branch, MD  Type: Established Patient  Specialty: Interventional Pain Management  PCP: Colon Branch, MD   Primary Reason for Visit: Interventional Pain Management Treatment. CC: Knee Pain (left)  Procedure:          Anesthesia, Analgesia, Anxiolysis:  Type: Therapeutic Intra-Articular Hyalgan Knee Injection #1  Region: Lateral infrapatellar Knee Region Level: Knee Joint Laterality: Left knee  Type: Local Anesthesia Indication(s): Analgesia         Local Anesthetic: Lidocaine 1-2% Route: Infiltration (/IM) IV Access: Declined Sedation: Declined   Position: Sitting   Indications: 1. Chronic knee pain (Left)   2. Osteoarthritis of knee (Left)   3. Tricompartment osteoarthritis of knee (Left)   4. Morbid obesity with body mass index (BMI) of 40.0 to 44.9 in adult [facility])    Pain Score: Pre-procedure: 7 /10 Post-procedure: 7 /10   Pre-op H&P Assessment:  Wanda [name] is a 75 y.o. (year old), female patient, seen today for interventional treatment. She  has a past surgical history that includes G3 P3 . Wanda [name] has a current medication list which includes the following prescription(s): aspirin ec, atorvastatin, calcium carbonate, vitamin d, cyclobenzaprine, gabapentin, hydrochlorothiazide, hydrocodone-acetaminophen,  [START ON 09/10/2020] hydrocodone-acetaminophen, [START ON 10/10/2020] hydrocodone-acetaminophen, lisinopril, sertraline, and vitamin d (ergocalciferol). Her primarily concern today is the Knee Pain (left)  Initial Vital Signs:  Pulse/HCG Rate: 82  Temp: (!) 96.2 F (35.7 C) Resp: (!) 98 BP: (!) 129/91 SpO2: (!) 18 %  BMI: Estimated body mass index is 39.48 kg/m as calculated from the following:   Height as of this encounter: 5\' 4"  (1.626 m).   Weight as of this encounter: 230 lb (104.3 kg).  Risk Assessment: Allergies: Reviewed. She is allergic to simvastatin.  Allergy Precautions: None required Coagulopathies: Reviewed. None identified.  Blood-thinner therapy: None at this time Active Infection(s): Reviewed. None identified. Wanda [name] is afebrile  Site Confirmation: Wanda [name] was asked to confirm the procedure and laterality before marking the site Procedure checklist: Completed Consent: Before the procedure and under the influence of no sedative(s), amnesic(s), or anxiolytics, the patient was informed of the treatment options, risks and possible complications. To fulfill our ethical and legal obligations, as recommended by the American Medical Association's Code of Ethics, I have informed the patient of my clinical impression; the nature and purpose of the treatment or procedure; the risks, benefits, and possible complications of the intervention; the alternatives, including doing nothing; the risk(s) and benefit(s) of the alternative treatment(s) or procedure(s); and the risk(s) and benefit(s) of doing nothing. The patient was provided information about the general risks and possible complications associated with the procedure. These may include, but are not limited to: failure to achieve desired goals, infection, bleeding, organ or nerve damage, allergic reactions, paralysis, and death. In addition, the patient was informed of those risks and complications associated to the  procedure, such as failure to decrease pain; infection; bleeding; organ or nerve  damage with subsequent damage to sensory, motor, and/or autonomic systems, resulting in permanent pain, numbness, and/or weakness of one or several areas of the body; allergic reactions; (i.e.: anaphylactic reaction); and/or death. Furthermore, the patient was informed of those risks and complications associated with the medications. These include, but are not limited to: allergic reactions (i.e.: anaphylactic or anaphylactoid reaction(s)); adrenal axis suppression; blood sugar elevation that in diabetics may result in ketoacidosis or comma; water retention that in patients with history of congestive heart failure may result in shortness of breath, pulmonary edema, and decompensation with resultant heart failure; weight gain; swelling or edema; medication-induced neural toxicity; particulate matter embolism and blood vessel occlusion with resultant organ, and/or nervous system infarction; and/or aseptic necrosis of one or more joints. Finally, the patient was informed that Medicine is not an exact science; therefore, there is also the possibility of unforeseen or unpredictable risks and/or possible complications that may result in a catastrophic outcome. The patient indicated having understood very clearly. We have given the patient no guarantees and we have made no promises. Enough time was given to the patient to ask questions, all of which were answered to the patient's satisfaction. Wanda [name] has indicated that she wanted to continue with the procedure. Attestation: I, the ordering provider, attest that I have discussed with the patient the benefits, risks, side-effects, alternatives, likelihood of achieving goals, and potential problems during recovery for the procedure that I have provided informed consent. Date  Time: 09/09/2020 10:37 AM  Pre-Procedure Preparation:  Monitoring: As per clinic protocol. Respiration, ETCO2,  SpO2, BP, heart rate and rhythm monitor placed and checked for adequate function Safety Precautions: Patient was assessed for positional comfort and pressure points before starting the procedure. Time-out: I initiated and conducted the "Time-out" before starting the procedure, as per protocol. The patient was asked to participate by confirming the accuracy of the "Time Out" information. Verification of the correct person, site, and procedure were performed and confirmed by me, the nursing staff, and the patient. "Time-out" conducted as per Joint Commission's Universal Protocol (UP.01.01.01). Time:  (No time out performed.   Nurse was unavailable and Md proceeded with procedure with no nurse.)  Description of Procedure:          Target Area: Knee Joint Approach: Just above the Lateral tibial plateau, lateral to the infrapatellar tendon. Area Prepped: Entire knee area, from the mid-thigh to the mid-shin. DuraPrep (Iodine Povacrylex [0.7% available iodine] and Isopropyl Alcohol, 74% w/w) Safety Precautions: Aspiration looking for blood return was conducted prior to all injections. At no point did we inject any substances, as a needle was being advanced. No attempts were made at seeking any paresthesias. Safe injection practices and needle disposal techniques used. Medications properly checked for expiration dates. SDV (single dose vial) medications used. Description of the Procedure: Protocol guidelines were followed. The patient was placed in position over the fluoroscopy table. The target area was identified and the area prepped in the usual manner. Skin & deeper tissues infiltrated with local anesthetic. Appropriate amount of time allowed to pass for local anesthetics to take effect. The procedure needles were then advanced to the target area. Proper needle placement secured. Negative aspiration confirmed. Solution injected in intermittent fashion, asking for systemic symptoms every 0.5cc of injectate.  The needles were then removed and the area cleansed, making sure to leave some of the prepping solution back to take advantage of its long term bactericidal properties. Vitals:   09/09/20 1036  BP: (!) 129/91  Pulse: 82  Resp: (!) 98  Temp: (!) 96.2 F (35.7 C)  SpO2: (!) 18%  Weight: 230 lb (104.3 kg)  Height: 5\' 4"  (1.626 m)    Start Time:   hrs. End Time:   hrs. Materials:  Needle(s) Type: Regular needle Gauge: 25G Length: 1.5-in Medication(s): Please see orders for medications and dosing details.  Imaging Guidance:          Type of Imaging Technique: None used Indication(s): N/A Exposure Time: No patient exposure Contrast: None used. Fluoroscopic Guidance: N/A Ultrasound Guidance: N/A Interpretation: N/A  Antibiotic Prophylaxis:   Anti-infectives (From admission, onward)   None     Indication(s): None identified  Post-operative Assessment:  Post-procedure Vital Signs:  Pulse/HCG Rate: 82  Temp: (!) 96.2 F (35.7 C) Resp: (!) 98 BP: (!) 129/91 SpO2: (!) 18 %  EBL: None  Complications: No immediate post-treatment complications observed by team, or reported by patient.  Note: The patient tolerated the entire procedure well. A repeat set of vitals were taken after the procedure and the patient was kept under observation following institutional policy, for this type of procedure. Post-procedural neurological assessment was performed, showing return to baseline, prior to discharge. The patient was provided with post-procedure discharge instructions, including a section on how to identify potential problems. Should any problems arise concerning this procedure, the patient was given instructions to immediately contact us, at any time, without hesitation. In any case, we plan to contact the patient by telephone for a follow-up status report regarding this interventional procedure.  Comments:  No additional relevant information.  Plan of Care  Orders:  Orders Placed  This Encounter  Procedures  . KNEE INJECTION    Hyalgan knee injection to be done by MD.    Scheduling Instructions:     Procedure: Intra-articular Hyalgan Knee injection #1     Side(s): Left Knee     Sedation: None     Timeframe: Today    Order Specific Question:   Where will this procedure be performed?    Answer:   [facility]  . KNEE INJECTION    Hyalgan knee injection. Please order Hyalgan.    Standing Status:   Future    Standing Expiration Date:   10/07/2020    Scheduling Instructions:     Procedure: Intra-articular Hyalgan Knee injection #2     Side: Left-sided     Sedation: None     Timeframe: in two (2) weeks    Order Specific Question:   Where will this procedure be performed?    Answer:   [facility]  . Informed Consent Details: Physician/Practitioner Attestation; Transcribe to consent form and obtain patient signature    Note: Always confirm laterality of pain with Wanda [name], before procedure. Transcribe to consent form and obtain patient signature.    Order Specific Question:   Physician/Practitioner attestation of informed consent for procedure/surgical case    Answer:   I, the physician/practitioner, attest that I have discussed with the patient the benefits, risks, side effects, alternatives, likelihood of achieving goals and potential problems during recovery for the procedure that I have provided informed consent.    Order Specific Question:   Procedure    Answer:   Therapeutic, left sided, intra-articular Hyalgan knee injection    Order Specific Question:   Physician/Practitioner performing the procedure    Answer:   Ryker Pherigo A. Dossie Arbour, MD    Order Specific Question:   Indication/Reason    Answer:   Chronic  left knee pain secondary to primary osteoarthritis of the knee  . Provide equipment / supplies at bedside    "Block Tray" (Disposable  single use) Needle type: SpinalRegular Amount/quantity: 1 Size: Short(1.5-inch) Gauge: 25G     Standing Status:   Standing    Number of Occurrences:   1    Order Specific Question:   Specify    Answer:   Block Tray   Chronic Opioid Analgesic:  Hydrocodone/APAP 5/325, 1 tab PO q 6 hrs (20 mg/day of hydrocodone) MME/day:20 mg/day.   Medications ordered for procedure: Meds ordered this encounter  Medications  . lidocaine (PF) (XYLOCAINE) 1 % injection 5 mL  . ropivacaine (PF) 2 mg/mL (0.2%) (NAROPIN) injection 5 mL  . Sodium Hyaluronate SOSY 2 mL   Medications administered: We administered lidocaine (PF), ropivacaine (PF) 2 mg/mL (0.2%), and Sodium Hyaluronate.  See the medical record for exact dosing, route, and time of administration.  Follow-up plan:   Return in about 2 weeks (around 09/23/2020) for Procedure (no sedation): (L) Hyalgan #2.       Interventional Therapies  Risk  Complexity Considerations:   NOTE: NO LUMBAR RFA until BMI <35.   Planned  Pending:   Pending further evaluation   Under consideration:   Diagnostic/palliative left L4-5 LESI  Diagnostic left L3 TFESI    Completed:   None at this time   Therapeutic  Palliative (PRN) options:   Palliative left lumbar facet block #3  Palliative right lumbar facet block #4  Palliative left IA Hyalgan knee injection #S2/N3 (last series completed 01/15/2018) Palliative left vs bilateral IA knee injection #3 (w/ steroid) (last done 08/15/18)      Recent Visits Date Type Provider Dept  08/11/20 Office Visit Milinda Pointer, MD [facility]  Showing recent visits within past 90 days and meeting all other requirements Today's Visits Date Type Provider Dept  09/09/20 Procedure visit Milinda Pointer, MD [facility]  Showing today's visits and meeting all other requirements Future Appointments No visits were found meeting these conditions. Showing future appointments within next 90 days and meeting all other requirements  Disposition: Discharge home  Discharge (Date  Time): 09/09/2020;  1120 hrs.   Primary Care Physician: Colon Branch, MD Location: [facility] Note by: Gaspar Cola, MD Date: 09/09/2020; Time: 1:20 PM  Disclaimer:  Medicine is not an exact science. The only guarantee in medicine is that nothing is guaranteed. It is important to note that the decision to proceed with this intervention was based on the information collected from the patient. The Data and conclusions were drawn from the patient's questionnaire, the interview, and the physical examination. Because the information was provided in large part by the patient, it cannot be guaranteed that it has not been purposely or unconsciously manipulated. Every effort has been made to obtain as much relevant data as possible for this evaluation. It is important to note that the conclusions that lead to this procedure are derived in large part from the available data. Always take into account that the treatment will also be dependent on availability of resources and existing treatment guidelines, considered by other Pain Management Practitioners as being common knowledge and practice, at the time of the intervention. For Medico-Legal purposes, it is also important to point out that variation in procedural techniques and pharmacological choices are the acceptable norm. The indications, contraindications, technique, and results of the above procedure should only be interpreted and judged by a Board-Certified Interventional  Pain Specialist with extensive familiarity and expertise in the same exact procedure and technique.

## 2020-09-09 NOTE — Progress Notes (Signed)
Safety precautions to be maintained throughout the outpatient stay will include: orient to surroundings, keep bed in low position, maintain call bell within reach at all times, provide assistance with transfer out of bed and ambulation.  

## 2020-09-09 NOTE — Patient Instructions (Signed)
____________________________________________________________________________________________  Preparing for your procedure (without sedation)  Procedure appointments are limited to planned procedures: . No Prescription Refills. . No disability issues will be discussed. . No medication changes will be discussed.  Instructions: . Oral Intake: Do not eat or drink anything for at least 6 hours prior to your procedure. (Exception: Blood Pressure Medication. See below.) . Transportation: Unless otherwise stated by your physician, you may drive yourself after the procedure. . Blood Pressure Medicine: Do not forget to take your blood pressure medicine with a sip of water the morning of the procedure. If your Diastolic (lower reading)is above 100 mmHg, elective cases will be cancelled/rescheduled. . Blood thinners: These will need to be stopped for procedures. Notify our staff if you are taking any blood thinners. Depending on which one you take, there will be specific instructions on how and when to stop it. . Diabetics on insulin: Notify the staff so that you can be scheduled 1st case in the morning. If your diabetes requires high dose insulin, take only  of your normal insulin dose the morning of the procedure and notify the staff that you have done so. . Preventing infections: Shower with an antibacterial soap the morning of your procedure.  . Build-up your immune system: Take 1000 mg of Vitamin C with every meal (3 times a day) the day prior to your procedure. . Antibiotics: Inform the staff if you have a condition or reason that requires you to take antibiotics before dental procedures. . Pregnancy: If you are pregnant, call and cancel the procedure. . Sickness: If you have a cold, fever, or any active infections, call and cancel the procedure. . Arrival: You must be in the facility at least 30 minutes prior to your scheduled procedure. . Children: Do not bring any children with you. . Dress  appropriately: Bring dark clothing that you would not mind if they get stained. . Valuables: Do not bring any jewelry or valuables.  Reasons to call and reschedule or cancel your procedure: (Following these recommendations will minimize the risk of a serious complication.) . Surgeries: Avoid having procedures within 2 weeks of any surgery. (Avoid for 2 weeks before or after any surgery). . Flu Shots: Avoid having procedures within 2 weeks of a flu shots or . (Avoid for 2 weeks before or after immunizations). . Barium: Avoid having a procedure within 7-10 days after having had a radiological study involving the use of radiological contrast. (Myelograms, Barium swallow or enema study). . Heart attacks: Avoid any elective procedures or surgeries for the initial 6 months after a "Myocardial Infarction" (Heart Attack). . Blood thinners: It is imperative that you stop these medications before procedures. Let us know if you if you take any blood thinner.  . Infection: Avoid procedures during or within two weeks of an infection (including chest colds or gastrointestinal problems). Symptoms associated with infections include: Localized redness, fever, chills, night sweats or profuse sweating, burning sensation when voiding, cough, congestion, stuffiness, runny nose, sore throat, diarrhea, nausea, vomiting, cold or Flu symptoms, recent or current infections. It is specially important if the infection is over the area that we intend to treat. . Heart and lung problems: Symptoms that may suggest an active cardiopulmonary problem include: cough, chest pain, breathing difficulties or shortness of breath, dizziness, ankle swelling, uncontrolled high or unusually low blood pressure, and/or palpitations. If you are experiencing any of these symptoms, cancel your procedure and contact your primary care physician for an evaluation.  Remember:  Regular   Business hours are:  Monday to Thursday 8:00 AM to 4:00  PM  Provider's Schedule: Brei Pociask, MD:  Procedure days: Tuesday and Thursday 7:30 AM to 4:00 PM  Bilal Lateef, MD:  Procedure days: Monday and Wednesday 7:30 AM to 4:00 PM ____________________________________________________________________________________________    

## 2020-09-10 ENCOUNTER — Telehealth: Payer: Self-pay

## 2020-09-10 ENCOUNTER — Other Ambulatory Visit: Payer: Self-pay | Admitting: Internal Medicine

## 2020-09-10 NOTE — Telephone Encounter (Signed)
Post procedure follow up phone call.  LM 

## 2020-09-14 ENCOUNTER — Other Ambulatory Visit: Payer: Self-pay | Admitting: Internal Medicine

## 2020-09-23 ENCOUNTER — Other Ambulatory Visit: Payer: Self-pay

## 2020-09-23 ENCOUNTER — Ambulatory Visit: Payer: Medicare HMO | Attending: Pain Medicine | Admitting: Pain Medicine

## 2020-09-23 ENCOUNTER — Encounter: Payer: Self-pay | Admitting: Pain Medicine

## 2020-09-23 VITALS — BP 110/72 | HR 78 | Temp 96.7°F | Resp 16 | Ht 64.0 in | Wt 230.0 lb

## 2020-09-23 DIAGNOSIS — M25562 Pain in left knee: Secondary | ICD-10-CM | POA: Diagnosis not present

## 2020-09-23 DIAGNOSIS — M174 Other bilateral secondary osteoarthritis of knee: Secondary | ICD-10-CM | POA: Diagnosis not present

## 2020-09-23 DIAGNOSIS — M1712 Unilateral primary osteoarthritis, left knee: Secondary | ICD-10-CM | POA: Insufficient documentation

## 2020-09-23 DIAGNOSIS — Z6841 Body Mass Index (BMI) 40.0 and over, adult: Secondary | ICD-10-CM | POA: Insufficient documentation

## 2020-09-23 DIAGNOSIS — M76892 Other specified enthesopathies of left lower limb, excluding foot: Secondary | ICD-10-CM | POA: Insufficient documentation

## 2020-09-23 DIAGNOSIS — G8929 Other chronic pain: Secondary | ICD-10-CM | POA: Insufficient documentation

## 2020-09-23 MED ORDER — SODIUM HYALURONATE (VISCOSUP) 20 MG/2ML IX SOSY: 2.0000 mL | PREFILLED_SYRINGE | Freq: Once | INTRA_ARTICULAR | Status: AC

## 2020-09-23 MED ORDER — LIDOCAINE HCL (PF) 1 % IJ SOLN
INTRAMUSCULAR | Status: AC
  Filled 2020-09-23: qty 5

## 2020-09-23 MED ORDER — ROPIVACAINE HCL 2 MG/ML IJ SOLN
INTRAMUSCULAR | Status: AC
  Filled 2020-09-23: qty 10

## 2020-09-23 MED ORDER — ROPIVACAINE HCL 2 MG/ML IJ SOLN
5.0000 mL | Freq: Once | INTRAMUSCULAR | Status: AC
  Administered 2020-09-23: 10 mL via INTRA_ARTICULAR

## 2020-09-23 MED ORDER — LIDOCAINE HCL (PF) 1 % IJ SOLN
5.0000 mL | Freq: Once | INTRAMUSCULAR | Status: AC
  Administered 2020-09-23: 5 mL

## 2020-09-23 NOTE — Progress Notes (Signed)
Safety precautions to be maintained throughout the outpatient stay will include: orient to surroundings, keep bed in low position, maintain call bell within reach at all times, provide assistance with transfer out of bed and ambulation.  

## 2020-09-23 NOTE — Progress Notes (Signed)
PROVIDER NOTE: Information contained herein reflects review and annotations entered in association with encounter. Interpretation of such information and data should be left to medically-trained personnel. Information provided to patient can be located elsewhere in the medical record under "Patient Instructions". Document created using STT-dictation technology, any transcriptional errors that may result from process are unintentional.    Patient: Wanda [name]  Service Category: Procedure  Provider: Gaspar Cola, MD  DOB: 1946-04-19  DOS: 09/23/2020  Location: [facility]  MRN: [id number]  Setting: Ambulatory - outpatient  Referring Provider: Colon Branch, MD  Type: Established Patient  Specialty: Interventional Pain Management  PCP: Colon Branch, MD   Primary Reason for Visit: Interventional Pain Management Treatment. CC: Knee Pain (left)  Procedure:          Anesthesia, Analgesia, Anxiolysis:  Type: Therapeutic Intra-Articular Hyalgan Knee Injection #2  Region: Lateral infrapatellar Knee Region Level: Knee Joint Laterality: Left knee  Type: Local Anesthesia Indication(s): Analgesia         Local Anesthetic: Lidocaine 1-2% Route: Infiltration ([place]/IM) IV Access: Declined Sedation: Declined   Position: Sitting   Indications: 1. Chronic knee pain (Left)   2. Osteoarthritis of knee (Left)   3. Tricompartment osteoarthritis of knee (Left)   4. Arthropathy of knee (Left)   5. Secondary osteoarthritis of knee (Bilateral)   6. Morbid obesity with body mass index (BMI) of 40.0 to 44.9 in adult ([place])   7. Enthesopathy of knee region (Left)    Pain Score: Pre-procedure: 5 /10 Post-procedure: 0-No pain/10   Pre-op H&P Assessment:  Wanda [name] is a 75 y.o. (year old), female patient, seen today for interventional treatment. She  has a past surgical history that includes G3 P3 . Wanda [name] has a current medication list which includes the following prescription(s):  aspirin ec, atorvastatin, vitamin d, hydrochlorothiazide, hydrocodone-acetaminophen, [START ON 10/10/2020] hydrocodone-acetaminophen, lisinopril, sertraline, vitamin d (ergocalciferol), calcium carbonate, cyclobenzaprine, gabapentin, and hydrocodone-acetaminophen. Her primarily concern today is the Knee Pain (left)  Initial Vital Signs:  Pulse/HCG Rate: 78  Temp: (!) 96.7 F (35.9 C) Resp: 16 BP: 110/72 SpO2: 99 %  BMI: Estimated body mass index is 39.48 kg/m as calculated from the following:   Height as of this encounter: 5\' 4"  (1.626 m).   Weight as of this encounter: 230 lb (104.3 kg).  Risk Assessment: Allergies: Reviewed. She is allergic to simvastatin.  Allergy Precautions: None required Coagulopathies: Reviewed. None identified.  Blood-thinner therapy: None at this time Active Infection(s): Reviewed. None identified. Wanda [name] is afebrile  Site Confirmation: Wanda [name] was asked to confirm the procedure and laterality before marking the site Procedure checklist: Completed Consent: Before the procedure and under the influence of no sedative(s), amnesic(s), or anxiolytics, the patient was informed of the treatment options, risks and possible complications. To fulfill our ethical and legal obligations, as recommended by the American Medical Association's Code of Ethics, I have informed the patient of my clinical impression; the nature and purpose of the treatment or procedure; the risks, benefits, and possible complications of the intervention; the alternatives, including doing nothing; the risk(s) and benefit(s) of the alternative treatment(s) or procedure(s); and the risk(s) and benefit(s) of doing nothing. The patient was provided information about the general risks and possible complications associated with the procedure. These may include, but are not limited to: failure to achieve desired goals, infection, bleeding, organ or nerve damage, allergic reactions, paralysis, and  death. In addition, the patient was informed of those risks  and complications associated to the procedure, such as failure to decrease pain; infection; bleeding; organ or nerve damage with subsequent damage to sensory, motor, and/or autonomic systems, resulting in permanent pain, numbness, and/or weakness of one or several areas of the body; allergic reactions; (i.e.: anaphylactic reaction); and/or death. Furthermore, the patient was informed of those risks and complications associated with the medications. These include, but are not limited to: allergic reactions (i.e.: anaphylactic or anaphylactoid reaction(s)); adrenal axis suppression; blood sugar elevation that in diabetics may result in ketoacidosis or comma; water retention that in patients with history of congestive heart failure may result in shortness of breath, pulmonary edema, and decompensation with resultant heart failure; weight gain; swelling or edema; medication-induced neural toxicity; particulate matter embolism and blood vessel occlusion with resultant organ, and/or nervous system infarction; and/or aseptic necrosis of one or more joints. Finally, the patient was informed that Medicine is not an exact science; therefore, there is also the possibility of unforeseen or unpredictable risks and/or possible complications that may result in a catastrophic outcome. The patient indicated having understood very clearly. We have given the patient no guarantees and we have made no promises. Enough time was given to the patient to ask questions, all of which were answered to the patient's satisfaction. Wanda [name] has indicated that she wanted to continue with the procedure. Attestation: I, the ordering provider, attest that I have discussed with the patient the benefits, risks, side-effects, alternatives, likelihood of achieving goals, and potential problems during recovery for the procedure that I have provided informed consent. Date  Time: 09/23/2020   9:47 AM  Pre-Procedure Preparation:  Monitoring: As per clinic protocol. Respiration, ETCO2, SpO2, BP, heart rate and rhythm monitor placed and checked for adequate function Safety Precautions: Patient was assessed for positional comfort and pressure points before starting the procedure. Time-out: I initiated and conducted the "Time-out" before starting the procedure, as per protocol. The patient was asked to participate by confirming the accuracy of the "Time Out" information. Verification of the correct person, site, and procedure were performed and confirmed by me, the nursing staff, and the patient. "Time-out" conducted as per Joint Commission's Universal Protocol (UP.01.01.01). Time: 1001  Description of Procedure:          Target Area: Knee Joint Approach: Just above the Lateral tibial plateau, lateral to the infrapatellar tendon. Area Prepped: Entire knee area, from the mid-thigh to the mid-shin. DuraPrep (Iodine Povacrylex [0.7% available iodine] and Isopropyl Alcohol, 74% w/w) Safety Precautions: Aspiration looking for blood return was conducted prior to all injections. At no point did we inject any substances, as a needle was being advanced. No attempts were made at seeking any paresthesias. Safe injection practices and needle disposal techniques used. Medications properly checked for expiration dates. SDV (single dose vial) medications used. Description of the Procedure: Protocol guidelines were followed. The patient was placed in position over the fluoroscopy table. The target area was identified and the area prepped in the usual manner. Skin & deeper tissues infiltrated with local anesthetic. Appropriate amount of time allowed to pass for local anesthetics to take effect. The procedure needles were then advanced to the target area. Proper needle placement secured. Negative aspiration confirmed. Solution injected in intermittent fashion, asking for systemic symptoms every 0.5cc of  injectate. The needles were then removed and the area cleansed, making sure to leave some of the prepping solution back to take advantage of its long term bactericidal properties. Vitals:   09/23/20 0947  BP: 110/72  Pulse: 78  Resp: 16  Temp: (!) 96.7 F (35.9 C)  TempSrc: Temporal  SpO2: 99%  Weight: 230 lb (104.3 kg)  Height: 5\' 4"  (1.626 m)    Start Time: 1001 hrs. End Time: 1002 hrs. Materials:  Needle(s) Type: Regular needle Gauge: 25G Length: 1.5-in Medication(s): Please see orders for medications and dosing details.  Imaging Guidance:          Type of Imaging Technique: None used Indication(s): N/A Exposure Time: No patient exposure Contrast: None used. Fluoroscopic Guidance: N/A Ultrasound Guidance: N/A Interpretation: N/A  Antibiotic Prophylaxis:   Anti-infectives (From admission, onward)   None     Indication(s): None identified  Post-operative Assessment:  Post-procedure Vital Signs:  Pulse/HCG Rate: 78  Temp: (!) 96.7 F (35.9 C) Resp: 16 BP: 110/72 SpO2: 99 %  EBL: None  Complications: No immediate post-treatment complications observed by team, or reported by patient.  Note: The patient tolerated the entire procedure well. A repeat set of vitals were taken after the procedure and the patient was kept under observation following institutional policy, for this type of procedure. Post-procedural neurological assessment was performed, showing return to baseline, prior to discharge. The patient was provided with post-procedure discharge instructions, including a section on how to identify potential problems. Should any problems arise concerning this procedure, the patient was given instructions to immediately contact us, at any time, without hesitation. In any case, we plan to contact the patient by telephone for a follow-up status report regarding this interventional procedure.  Comments:  No additional relevant information.  Plan of Care  Orders:   Orders Placed This Encounter  Procedures  . KNEE INJECTION    Hyalgan knee injection to be done by MD.    Scheduling Instructions:     Procedure: Intra-articular Hyalgan Knee injection #2     Side(s): Left Knee     Sedation: None     Timeframe: Today    Order Specific Question:   Where will this procedure be performed?    Answer:   [facility]  . KNEE INJECTION    Hyalgan knee injection. Please order Hyalgan.    Standing Status:   Future    Standing Expiration Date:   10/24/2020    Scheduling Instructions:     Procedure: Intra-articular Hyalgan Knee injection #3     Side: Left-sided     Sedation: None     Timeframe: in two (2) weeks    Order Specific Question:   Where will this procedure be performed?    Answer:   [facility]  . Informed Consent Details: Physician/Practitioner Attestation; Transcribe to consent form and obtain patient signature    Note: Always confirm laterality of pain with Ms. Canova, before procedure. Transcribe to consent form and obtain patient signature.    Order Specific Question:   Physician/Practitioner attestation of informed consent for procedure/surgical case    Answer:   I, the physician/practitioner, attest that I have discussed with the patient the benefits, risks, side effects, alternatives, likelihood of achieving goals and potential problems during recovery for the procedure that I have provided informed consent.    Order Specific Question:   Procedure    Answer:   Therapeutic, left sided, intra-articular Hyalgan knee injection    Order Specific Question:   Physician/Practitioner performing the procedure    Answer:   Eleina Jergens A. Dossie Arbour, MD    Order Specific Question:   Indication/Reason    Answer:   Chronic left knee pain secondary  to primary osteoarthritis of the knee   Chronic Opioid Analgesic:  Hydrocodone/APAP 5/325, 1 tab PO q 6 hrs (20 mg/day of hydrocodone) MME/day:20 mg/day.   Medications ordered for  procedure: Meds ordered this encounter  Medications  . lidocaine (PF) (XYLOCAINE) 1 % injection 5 mL  . ropivacaine (PF) 2 mg/mL (0.2%) (NAROPIN) injection 5 mL  . Sodium Hyaluronate SOSY 2 mL   Medications administered: We administered lidocaine (PF), ropivacaine (PF) 2 mg/mL (0.2%), and Sodium Hyaluronate.  See the medical record for exact dosing, route, and time of administration.  Follow-up plan:   Return in about 2 weeks (around 10/07/2020) for Procedure (no sedation): (L) Hyalgan #3.       Interventional Therapies  Risk  Complexity Considerations:   NOTE: NO LUMBAR RFA until BMI <35.   Planned  Pending:   Pending further evaluation   Under consideration:   Diagnostic/palliative left L4-5 LESI  Diagnostic left L3 TFESI    Completed:   None at this time   Therapeutic  Palliative (PRN) options:   Palliative left lumbar facet block #3  Palliative right lumbar facet block #4  Palliative left IA Hyalgan knee injection #S2/N3 (last series completed 01/15/2018) Palliative left vs bilateral IA knee injection #3 (w/ steroid) (last done 08/15/18)       Recent Visits Date Type Provider Dept  09/09/20 Procedure visit Milinda Pointer, MD [facility]  08/11/20 Office Visit Milinda Pointer, MD [facility]  Showing recent visits within past 90 days and meeting all other requirements Today's Visits Date Type Provider Dept  09/23/20 Procedure visit Milinda Pointer, MD [facility]  Showing today's visits and meeting all other requirements Future Appointments No visits were found meeting these conditions. Showing future appointments within next 90 days and meeting all other requirements  Disposition: Discharge home  Discharge (Date  Time): 09/23/2020; 1005 hrs.   Primary Care Physician: Colon Branch, MD Location: [facility] Note by: Gaspar Cola, MD Date: 09/23/2020; Time: 10:20 AM  Disclaimer:  Medicine  is not an Chief Strategy Officer. The only guarantee in medicine is that nothing is guaranteed. It is important to note that the decision to proceed with this intervention was based on the information collected from the patient. The Data and conclusions were drawn from the patient's questionnaire, the interview, and the physical examination. Because the information was provided in large part by the patient, it cannot be guaranteed that it has not been purposely or unconsciously manipulated. Every effort has been made to obtain as much relevant data as possible for this evaluation. It is important to note that the conclusions that lead to this procedure are derived in large part from the available data. Always take into account that the treatment will also be dependent on availability of resources and existing treatment guidelines, considered by other Pain Management Practitioners as being common knowledge and practice, at the time of the intervention. For Medico-Legal purposes, it is also important to point out that variation in procedural techniques and pharmacological choices are the acceptable norm. The indications, contraindications, technique, and results of the above procedure should only be interpreted and judged by a Board-Certified Interventional Pain Specialist with extensive familiarity and expertise in the same exact procedure and technique.

## 2020-09-23 NOTE — Patient Instructions (Signed)
____________________________________________________________________________________________  Post-Procedure Discharge Instructions  Instructions:  Apply ice:   Purpose: This will minimize any swelling and discomfort after procedure.   When: Day of procedure, as soon as you get home.  How: Fill a plastic sandwich bag with crushed ice. Cover it with a small towel and apply to injection site.  How long: (15 min on, 15 min off) Apply for 15 minutes then remove x 15 minutes.  Repeat sequence on day of procedure, until you go to bed.  Apply heat:   Purpose: To treat any soreness and discomfort from the procedure.  When: Starting the next day after the procedure.  How: Apply heat to procedure site starting the day following the procedure.  How long: May continue to repeat daily, until discomfort goes away.  Food intake: Start with clear liquids (like water) and advance to regular food, as tolerated.   Physical activities: Keep activities to a minimum for the first 8 hours after the procedure. After that, then as tolerated.  Driving: If you have received any sedation, be responsible and do not drive. You are not allowed to drive for 24 hours after having sedation.  Blood thinner: (Applies only to those taking blood thinners) You may restart your blood thinner 6 hours after your procedure.  Insulin: (Applies only to Diabetic patients taking insulin) As soon as you can eat, you may resume your normal dosing schedule.  Infection prevention: Keep procedure site clean and dry. Shower daily and clean area with soap and water.  Post-procedure Pain Diary: Extremely important that this be done correctly and accurately. Recorded information will be used to determine the next step in treatment. For the purpose of accuracy, follow these rules:  Evaluate only the area treated. Do not report or include pain from an untreated area. For the purpose of this evaluation, ignore all other areas of pain,  except for the treated area.  After your procedure, avoid taking a long nap and attempting to complete the pain diary after you wake up. Instead, set your alarm clock to go off every hour, on the hour, for the initial 8 hours after the procedure. Document the duration of the numbing medicine, and the relief you are getting from it.  Do not go to sleep and attempt to complete it later. It will not be accurate. If you received sedation, it is likely that you were given a medication that may cause amnesia. Because of this, completing the diary at a later time may cause the information to be inaccurate. This information is needed to plan your care.  Follow-up appointment: Keep your post-procedure follow-up evaluation appointment after the procedure (usually 2 weeks for most procedures, 6 weeks for radiofrequencies). DO NOT FORGET to bring you pain diary with you.   Expect: (What should I expect to see with my procedure?)  From numbing medicine (AKA: Local Anesthetics): Numbness or decrease in pain. You may also experience some weakness, which if present, could last for the duration of the local anesthetic.  Onset: Full effect within 15 minutes of injected.  Duration: It will depend on the type of local anesthetic used. On the average, 1 to 8 hours.   From steroids (Applies only if steroids were used): Decrease in swelling or inflammation. Once inflammation is improved, relief of the pain will follow.  Onset of benefits: Depends on the amount of swelling present. The more swelling, the longer it will take for the benefits to be seen. In some cases, up to 10 days.    Duration: Steroids will stay in the system x 2 weeks. Duration of benefits will depend on multiple posibilities including persistent irritating factors.  Side-effects: If present, they may typically last 2 weeks (the duration of the steroids).  Frequent: Cramps (if they occur, drink Gatorade and take over-the-counter Magnesium 450-500 mg  once to twice a day); water retention with temporary weight gain; increases in blood sugar; decreased immune system response; increased appetite.  Occasional: Facial flushing (red, warm cheeks); mood swings; menstrual changes.  Uncommon: Long-term decrease or suppression of natural hormones; bone thinning. (These are more common with higher doses or more frequent use. This is why we prefer that our patients avoid having any injection therapies in other practices.)   Very Rare: Severe mood changes; psychosis; aseptic necrosis.  From procedure: Some discomfort is to be expected once the numbing medicine wears off. This should be minimal if ice and heat are applied as instructed.  Call if: (When should I call?)  You experience numbness and weakness that gets worse with time, as opposed to wearing off.  New onset bowel or bladder incontinence. (Applies only to procedures done in the spine)  Emergency Numbers:  Durning business hours (Monday - Thursday, 8:00 AM - 4:00 PM) (Friday, 9:00 AM - 12:00 Noon): [phone]  After hours: [phone]  NOTE: If you are having a problem and are unable connect with, or to talk to a provider, then go to your nearest urgent care or emergency department. If the problem is serious and urgent, please call 911. ____________________________________________________________________________________________   ____________________________________________________________________________________________  Preparing for your procedure (without sedation)  Procedure appointments are limited to planned procedures: . No Prescription Refills. . No disability issues will be discussed. . No medication changes will be discussed.  Instructions: . Oral Intake: Do not eat or drink anything for at least 6 hours prior to your procedure. (Exception: Blood Pressure Medication. See below.) . Transportation: Unless otherwise stated by your physician, you may drive yourself  after the procedure. . Blood Pressure Medicine: Do not forget to take your blood pressure medicine with a sip of water the morning of the procedure. If your Diastolic (lower reading)is above 100 mmHg, elective cases will be cancelled/rescheduled. . Blood thinners: These will need to be stopped for procedures. Notify our staff if you are taking any blood thinners. Depending on which one you take, there will be specific instructions on how and when to stop it. . Diabetics on insulin: Notify the staff so that you can be scheduled 1st case in the morning. If your diabetes requires high dose insulin, take only  of your normal insulin dose the morning of the procedure and notify the staff that you have done so. . Preventing infections: Shower with an antibacterial soap the morning of your procedure.  . Build-up your immune system: Take 1000 mg of Vitamin C with every meal (3 times a day) the day prior to your procedure. . Antibiotics: Inform the staff if you have a condition or reason that requires you to take antibiotics before dental procedures. . Pregnancy: If you are pregnant, call and cancel the procedure. . Sickness: If you have a cold, fever, or any active infections, call and cancel the procedure. . Arrival: You must be in the facility at least 30 minutes prior to your scheduled procedure. . Children: Do not bring any children with you. . Dress appropriately: Bring dark clothing that you would not mind if they get stained. . Valuables: Do not bring any jewelry   or valuables.  Reasons to call and reschedule or cancel your procedure: (Following these recommendations will minimize the risk of a serious complication.) . Surgeries: Avoid having procedures within 2 weeks of any surgery. (Avoid for 2 weeks before or after any surgery). . Flu Shots: Avoid having procedures within 2 weeks of a flu shots or . (Avoid for 2 weeks before or after immunizations). . Barium: Avoid having a procedure within 7-10  days after having had a radiological study involving the use of radiological contrast. (Myelograms, Barium swallow or enema study). . Heart attacks: Avoid any elective procedures or surgeries for the initial 6 months after a "Myocardial Infarction" (Heart Attack). . Blood thinners: It is imperative that you stop these medications before procedures. Let us know if you if you take any blood thinner.  . Infection: Avoid procedures during or within two weeks of an infection (including chest colds or gastrointestinal problems). Symptoms associated with infections include: Localized redness, fever, chills, night sweats or profuse sweating, burning sensation when voiding, cough, congestion, stuffiness, runny nose, sore throat, diarrhea, nausea, vomiting, cold or Flu symptoms, recent or current infections. It is specially important if the infection is over the area that we intend to treat. . Heart and lung problems: Symptoms that may suggest an active cardiopulmonary problem include: cough, chest pain, breathing difficulties or shortness of breath, dizziness, ankle swelling, uncontrolled high or unusually low blood pressure, and/or palpitations. If you are experiencing any of these symptoms, cancel your procedure and contact your primary care physician for an evaluation.  Remember:  Regular Business hours are:  Monday to Thursday 8:00 AM to 4:00 PM  Provider's Schedule: Shaina Gullatt, MD:  Procedure days: Tuesday and Thursday 7:30 AM to 4:00 PM  Bilal Lateef, MD:  Procedure days: Monday and Wednesday 7:30 AM to 4:00 PM ____________________________________________________________________________________________    

## 2020-09-24 ENCOUNTER — Telehealth: Payer: Self-pay

## 2020-09-24 NOTE — Telephone Encounter (Signed)
Post procedure phone call.  LM 

## 2020-10-02 NOTE — Progress Notes (Signed)
PROVIDER NOTE: Information contained herein reflects review and annotations entered in association with encounter. Interpretation of such information and data should be left to medically-trained personnel. Information provided to patient can be located elsewhere in the medical record under "Patient Instructions". Document created using STT-dictation technology, any transcriptional errors that may result from process are unintentional.    Patient: Wanda [name]  Service Category: Procedure  Provider: Gaspar Cola, MD  DOB: 15-Sep-1945  DOS: 10/07/2020  Location: [facility]  MRN: [id number]  Setting: Ambulatory - outpatient  Referring Provider: Colon Branch, MD  Type: Established Patient  Specialty: Interventional Pain Management  PCP: Colon Branch, MD   Primary Reason for Visit: Interventional Pain Management Treatment. CC: Knee Pain  Procedure:          Anesthesia, Analgesia, Anxiolysis:  Type: Therapeutic Intra-Articular Hyalgan Knee Injection #3  Region: Lateral infrapatellar Knee Region Level: Knee Joint Laterality: Left knee  Type: Local Anesthesia Indication(s): Analgesia         Local Anesthetic: Lidocaine 1-2% Route: Infiltration ([place]/IM) IV Access: Declined Sedation: Declined   Position: Sitting   Indications: 1. Osteoarthritis of knee (Left)   2. Chronic knee pain (Left)   3. Arthropathy of knee (Left)   4. Enthesopathy of knee region (Left)   5. Tricompartment osteoarthritis of knee (Left)   6. Morbid obesity with body mass index (BMI) of 40.0 to 44.9 in adult [employer])    Pain Score: Pre-procedure: 3 /10 Post-procedure: 0-No pain/10   Pre-op H&P Assessment:  Wanda [name] is a 75 y.o. (year old), female patient, seen today for interventional treatment. She  has a past surgical history that includes G3 P3 . Wanda [name] has a current medication list which includes the following prescription(s): aspirin ec, atorvastatin, vitamin d, hydrochlorothiazide,  hydrocodone-acetaminophen, [START ON 10/10/2020] hydrocodone-acetaminophen, lisinopril, sertraline, vitamin d (ergocalciferol), calcium carbonate, cyclobenzaprine, gabapentin, and hydrocodone-acetaminophen. Her primarily concern today is the Knee Pain  Initial Vital Signs:  Pulse/HCG Rate: 80  Temp: (!) 96.6 F (35.9 C) Resp: 16 BP: 120/82 SpO2: 98 %  BMI: Estimated body mass index is 39.48 kg/m as calculated from the following:   Height as of this encounter: 5\' 4"  (1.626 m).   Weight as of this encounter: 230 lb (104.3 kg).  Risk Assessment: Allergies: Reviewed. She is allergic to simvastatin.  Allergy Precautions: None required Coagulopathies: Reviewed. None identified.  Blood-thinner therapy: None at this time Active Infection(s): Reviewed. None identified. Wanda [name] is afebrile  Site Confirmation: Wanda [name] was asked to confirm the procedure and laterality before marking the site Procedure checklist: Completed Consent: Before the procedure and under the influence of no sedative(s), amnesic(s), or anxiolytics, the patient was informed of the treatment options, risks and possible complications. To fulfill our ethical and legal obligations, as recommended by the American Medical Association's Code of Ethics, I have informed the patient of my clinical impression; the nature and purpose of the treatment or procedure; the risks, benefits, and possible complications of the intervention; the alternatives, including doing nothing; the risk(s) and benefit(s) of the alternative treatment(s) or procedure(s); and the risk(s) and benefit(s) of doing nothing. The patient was provided information about the general risks and possible complications associated with the procedure. These may include, but are not limited to: failure to achieve desired goals, infection, bleeding, organ or nerve damage, allergic reactions, paralysis, and death. In addition, the patient was informed of those risks and  complications associated to the procedure, such as failure to  decrease pain; infection; bleeding; organ or nerve damage with subsequent damage to sensory, motor, and/or autonomic systems, resulting in permanent pain, numbness, and/or weakness of one or several areas of the body; allergic reactions; (i.e.: anaphylactic reaction); and/or death. Furthermore, the patient was informed of those risks and complications associated with the medications. These include, but are not limited to: allergic reactions (i.e.: anaphylactic or anaphylactoid reaction(s)); adrenal axis suppression; blood sugar elevation that in diabetics may result in ketoacidosis or comma; water retention that in patients with history of congestive heart failure may result in shortness of breath, pulmonary edema, and decompensation with resultant heart failure; weight gain; swelling or edema; medication-induced neural toxicity; particulate matter embolism and blood vessel occlusion with resultant organ, and/or nervous system infarction; and/or aseptic necrosis of one or more joints. Finally, the patient was informed that Medicine is not an exact science; therefore, there is also the possibility of unforeseen or unpredictable risks and/or possible complications that may result in a catastrophic outcome. The patient indicated having understood very clearly. We have given the patient no guarantees and we have made no promises. Enough time was given to the patient to ask questions, all of which were answered to the patient's satisfaction. Wanda [name] has indicated that she wanted to continue with the procedure. Attestation: I, the ordering provider, attest that I have discussed with the patient the benefits, risks, side-effects, alternatives, likelihood of achieving goals, and potential problems during recovery for the procedure that I have provided informed consent. Date   Time: 10/07/2020  1:17 PM  Pre-Procedure Preparation:  Monitoring: As per  clinic protocol. Respiration, ETCO2, SpO2, BP, heart rate and rhythm monitor placed and checked for adequate function Safety Precautions: Patient was assessed for positional comfort and pressure points before starting the procedure. Time-out: I initiated and conducted the "Time-out" before starting the procedure, as per protocol. The patient was asked to participate by confirming the accuracy of the "Time Out" information. Verification of the correct person, site, and procedure were performed and confirmed by me, the nursing staff, and the patient. "Time-out" conducted as per Joint Commission's Universal Protocol (UP.01.01.01). Time: 1339  Description of Procedure:          Target Area: Knee Joint Approach: Just above the Lateral tibial plateau, lateral to the infrapatellar tendon. Area Prepped: Entire knee area, from the mid-thigh to the mid-shin. DuraPrep (Iodine Povacrylex [0.7% available iodine] and Isopropyl Alcohol, 74% w/w) Safety Precautions: Aspiration looking for blood return was conducted prior to all injections. At no point did we inject any substances, as a needle was being advanced. No attempts were made at seeking any paresthesias. Safe injection practices and needle disposal techniques used. Medications properly checked for expiration dates. SDV (single dose vial) medications used. Description of the Procedure: Protocol guidelines were followed. The patient was placed in position over the fluoroscopy table. The target area was identified and the area prepped in the usual manner. Skin & deeper tissues infiltrated with local anesthetic. Appropriate amount of time allowed to pass for local anesthetics to take effect. The procedure needles were then advanced to the target area. Proper needle placement secured. Negative aspiration confirmed. Solution injected in intermittent fashion, asking for systemic symptoms every 0.5cc of injectate. The needles were then removed and the area cleansed,  making sure to leave some of the prepping solution back to take advantage of its long term bactericidal properties. Vitals:   10/07/20 1316 10/07/20 1344  BP: 120/82 (!) 115/92  Pulse: 80 80  Resp:  16 17  Temp: (!) 96.6 F (35.9 C)   SpO2: 98% 99%  Weight: 230 lb (104.3 kg)   Height: 5\' 4"  (1.626 m)     Start Time: 1339 hrs. End Time:   hrs. Materials:  Needle(s) Type: Regular needle Gauge: 25G Length: 1.5-in Medication(s): Please see orders for medications and dosing details.  Imaging Guidance:          Type of Imaging Technique: None used Indication(s): N/A Exposure Time: No patient exposure Contrast: None used. Fluoroscopic Guidance: N/A Ultrasound Guidance: N/A Interpretation: N/A  Antibiotic Prophylaxis:   Anti-infectives (From admission, onward)   None     Indication(s): None identified  Post-operative Assessment:  Post-procedure Vital Signs:  Pulse/HCG Rate: 80  Temp: (!) 96.6 F (35.9 C) Resp: 17 BP: (!) 115/92 SpO2: 99 %  EBL: None  Complications: No immediate post-treatment complications observed by team, or reported by patient.  Note: The patient tolerated the entire procedure well. A repeat set of vitals were taken after the procedure and the patient was kept under observation following institutional policy, for this type of procedure. Post-procedural neurological assessment was performed, showing return to baseline, prior to discharge. The patient was provided with post-procedure discharge instructions, including a section on how to identify potential problems. Should any problems arise concerning this procedure, the patient was given instructions to immediately contact us, at any time, without hesitation. In any case, we plan to contact the patient by telephone for a follow-up status report regarding this interventional procedure.  Comments:  No additional relevant information.  Plan of Care  Orders:  Orders Placed This Encounter  Procedures    KNEE INJECTION    Hyalgan knee injection to be done by MD.    Scheduling Instructions:     Procedure: Intra-articular Hyalgan Knee injection #3     Side(s): Left Knee     Sedation: None     Timeframe: Today    Order Specific Question:   Where will this procedure be performed?    Answer:   [facility]   KNEE INJECTION    Hyalgan knee injection. Please order Hyalgan.    Standing Status:   Future    Standing Expiration Date:   11/07/2020    Scheduling Instructions:     Procedure: Intra-articular Hyalgan Knee injection #4     Side: Left-sided     Sedation: None     Timeframe: in two (2) weeks    Order Specific Question:   Where will this procedure be performed?    Answer:   [facility]   Informed Consent Details: Physician/Practitioner Attestation; Transcribe to consent form and obtain patient signature    Note: Always confirm laterality of pain with Ms. Dolson, before procedure. Transcribe to consent form and obtain patient signature.    Order Specific Question:   Physician/Practitioner attestation of informed consent for procedure/surgical case    Answer:   I, the physician/practitioner, attest that I have discussed with the patient the benefits, risks, side effects, alternatives, likelihood of achieving goals and potential problems during recovery for the procedure that I have provided informed consent.    Order Specific Question:   Procedure    Answer:   Therapeutic, left sided, intra-articular Hyalgan knee injection    Order Specific Question:   Physician/Practitioner performing the procedure    Answer:   Terreon Ekholm A. Dossie Arbour, MD    Order Specific Question:   Indication/Reason    Answer:   Chronic left knee pain secondary  to primary osteoarthritis of the knee   Provide equipment / supplies at bedside    "Block Tray" (Disposable   single use) Needle type: SpinalRegular Amount/quantity: 1 Size: Short(1.5-inch) Gauge: 25G    Standing Status:   Standing     Number of Occurrences:   1    Order Specific Question:   Specify    Answer:   Block Tray   Chronic Opioid Analgesic:  Hydrocodone/APAP 5/325, 1 tab PO q 6 hrs (20 mg/day of hydrocodone) MME/day:20 mg/day.   Medications ordered for procedure: Meds ordered this encounter  Medications   lidocaine (PF) (XYLOCAINE) 1 % injection 5 mL   ropivacaine (PF) 2 mg/mL (0.2%) (NAROPIN) injection 5 mL   Sodium Hyaluronate SOSY 2 mL   Medications administered: We administered lidocaine (PF), ropivacaine (PF) 2 mg/mL (0.2%), and Sodium Hyaluronate.  See the medical record for exact dosing, route, and time of administration.  Follow-up plan:   Return in about 2 weeks (around 10/21/2020) for Procedure (no sedation): (L) Hyalgan #4.       Interventional Therapies  Risk   Complexity Considerations:   NOTE: NO LUMBAR RFA until BMI <35.   Planned   Pending:   Pending further evaluation   Under consideration:   Diagnostic/palliative left L4-5 LESI  Diagnostic left L3 TFESI    Completed:   None at this time   Therapeutic   Palliative (PRN) options:   Palliative left lumbar facet block #3  Palliative right lumbar facet block #4  Palliative left IA Hyalgan knee injection #S2/N3 (last series completed 01/15/2018) Palliative left vs bilateral IA knee injection #3 (w/ steroid) (last done 08/15/18)        Recent Visits Date Type Provider Dept  09/23/20 Procedure visit Milinda Pointer, MD [facility]  09/09/20 Procedure visit Milinda Pointer, MD [facility]  08/11/20 Office Visit Milinda Pointer, MD [facility]  Showing recent visits within past 90 days and meeting all other requirements Today's Visits Date Type Provider Dept  10/07/20 Procedure visit Milinda Pointer, MD [facility]  Showing today's visits and meeting all other requirements Future Appointments No visits were found meeting these conditions. Showing future appointments within  next 90 days and meeting all other requirements  Disposition: Discharge home  Discharge (Date   Time): 10/07/2020; 1345 hrs.   Primary Care Physician: Colon Branch, MD Location: [facility] Note by: Gaspar Cola, MD Date: 10/07/2020; Time: 1:58 PM  Disclaimer:  Medicine is not an Chief Strategy Officer. The only guarantee in medicine is that nothing is guaranteed. It is important to note that the decision to proceed with this intervention was based on the information collected from the patient. The Data and conclusions were drawn from the patient's questionnaire, the interview, and the physical examination. Because the information was provided in large part by the patient, it cannot be guaranteed that it has not been purposely or unconsciously manipulated. Every effort has been made to obtain as much relevant data as possible for this evaluation. It is important to note that the conclusions that lead to this procedure are derived in large part from the available data. Always take into account that the treatment will also be dependent on availability of resources and existing treatment guidelines, considered by other Pain Management Practitioners as being common knowledge and practice, at the time of the intervention. For Medico-Legal purposes, it is also important to point out that variation in procedural techniques and pharmacological choices are  the acceptable norm. The indications, contraindications, technique, and results of the above procedure should only be interpreted and judged by a Board-Certified Interventional Pain Specialist with extensive familiarity and expertise in the same exact procedure and technique.

## 2020-10-04 ENCOUNTER — Encounter: Payer: Self-pay | Admitting: Internal Medicine

## 2020-10-07 ENCOUNTER — Other Ambulatory Visit: Payer: Self-pay

## 2020-10-07 ENCOUNTER — Encounter: Payer: Self-pay | Admitting: Pain Medicine

## 2020-10-07 ENCOUNTER — Ambulatory Visit: Payer: Medicare HMO | Attending: Pain Medicine | Admitting: Pain Medicine

## 2020-10-07 VITALS — BP 115/92 | HR 80 | Temp 96.6°F | Resp 17 | Ht 64.0 in | Wt 230.0 lb

## 2020-10-07 DIAGNOSIS — M25562 Pain in left knee: Secondary | ICD-10-CM | POA: Diagnosis not present

## 2020-10-07 DIAGNOSIS — M76892 Other specified enthesopathies of left lower limb, excluding foot: Secondary | ICD-10-CM

## 2020-10-07 DIAGNOSIS — Z6841 Body Mass Index (BMI) 40.0 and over, adult: Secondary | ICD-10-CM | POA: Insufficient documentation

## 2020-10-07 DIAGNOSIS — G8929 Other chronic pain: Secondary | ICD-10-CM | POA: Diagnosis not present

## 2020-10-07 DIAGNOSIS — M1712 Unilateral primary osteoarthritis, left knee: Secondary | ICD-10-CM | POA: Diagnosis not present

## 2020-10-07 MED ORDER — LIDOCAINE HCL (PF) 1 % IJ SOLN
5.0000 mL | Freq: Once | INTRAMUSCULAR | Status: AC
  Administered 2020-10-07: 5 mL

## 2020-10-07 MED ORDER — ROPIVACAINE HCL 2 MG/ML IJ SOLN
5.0000 mL | Freq: Once | INTRAMUSCULAR | Status: AC
  Administered 2020-10-07: 5 mL via INTRA_ARTICULAR

## 2020-10-07 MED ORDER — SODIUM HYALURONATE (VISCOSUP) 20 MG/2ML IX SOSY
2.0000 mL | PREFILLED_SYRINGE | Freq: Once | INTRA_ARTICULAR | Status: AC
  Administered 2020-10-07: 2 mL via INTRA_ARTICULAR

## 2020-10-07 MED ORDER — ROPIVACAINE HCL 2 MG/ML IJ SOLN
INTRAMUSCULAR | Status: AC
  Filled 2020-10-07: qty 10

## 2020-10-07 MED ORDER — LIDOCAINE HCL (PF) 1 % IJ SOLN
INTRAMUSCULAR | Status: AC
  Filled 2020-10-07: qty 5

## 2020-10-07 NOTE — Patient Instructions (Signed)
____________________________________________________________________________________________  Preparing for your procedure (without sedation)  Procedure appointments are limited to planned procedures: . No Prescription Refills. . No disability issues will be discussed. . No medication changes will be discussed.  Instructions: . Oral Intake: Do not eat or drink anything for at least 6 hours prior to your procedure. (Exception: Blood Pressure Medication. See below.) . Transportation: Unless otherwise stated by your physician, you may drive yourself after the procedure. . Blood Pressure Medicine: Do not forget to take your blood pressure medicine with a sip of water the morning of the procedure. If your Diastolic (lower reading)is above 100 mmHg, elective cases will be cancelled/rescheduled. . Blood thinners: These will need to be stopped for procedures. Notify our staff if you are taking any blood thinners. Depending on which one you take, there will be specific instructions on how and when to stop it. . Diabetics on insulin: Notify the staff so that you can be scheduled 1st case in the morning. If your diabetes requires high dose insulin, take only  of your normal insulin dose the morning of the procedure and notify the staff that you have done so. . Preventing infections: Shower with an antibacterial soap the morning of your procedure.  . Build-up your immune system: Take 1000 mg of Vitamin C with every meal (3 times a day) the day prior to your procedure. . Antibiotics: Inform the staff if you have a condition or reason that requires you to take antibiotics before dental procedures. . Pregnancy: If you are pregnant, call and cancel the procedure. . Sickness: If you have a cold, fever, or any active infections, call and cancel the procedure. . Arrival: You must be in the facility at least 30 minutes prior to your scheduled procedure. . Children: Do not bring any children with you. . Dress  appropriately: Bring dark clothing that you would not mind if they get stained. . Valuables: Do not bring any jewelry or valuables.  Reasons to call and reschedule or cancel your procedure: (Following these recommendations will minimize the risk of a serious complication.) . Surgeries: Avoid having procedures within 2 weeks of any surgery. (Avoid for 2 weeks before or after any surgery). . Flu Shots: Avoid having procedures within 2 weeks of a flu shots or . (Avoid for 2 weeks before or after immunizations). . Barium: Avoid having a procedure within 7-10 days after having had a radiological study involving the use of radiological contrast. (Myelograms, Barium swallow or enema study). . Heart attacks: Avoid any elective procedures or surgeries for the initial 6 months after a "Myocardial Infarction" (Heart Attack). . Blood thinners: It is imperative that you stop these medications before procedures. Let us know if you if you take any blood thinner.  . Infection: Avoid procedures during or within two weeks of an infection (including chest colds or gastrointestinal problems). Symptoms associated with infections include: Localized redness, fever, chills, night sweats or profuse sweating, burning sensation when voiding, cough, congestion, stuffiness, runny nose, sore throat, diarrhea, nausea, vomiting, cold or Flu symptoms, recent or current infections. It is specially important if the infection is over the area that we intend to treat. . Heart and lung problems: Symptoms that may suggest an active cardiopulmonary problem include: cough, chest pain, breathing difficulties or shortness of breath, dizziness, ankle swelling, uncontrolled high or unusually low blood pressure, and/or palpitations. If you are experiencing any of these symptoms, cancel your procedure and contact your primary care physician for an evaluation.  Remember:  Regular   Business hours are:  Monday to Thursday 8:00 AM to 4:00  PM  Provider's Schedule: Milinda Pointer, MD:  Procedure days: Tuesday and Thursday 7:30 AM to 4:00 PM  Gillis Santa, MD:  Procedure days: Monday and Wednesday 7:30 AM to 4:00 PM ____________________________________________________________________________________________   ____________________________________________________________________________________________  Post-Procedure Discharge Instructions  Instructions:  Apply ice:   Purpose: This will minimize any swelling and discomfort after procedure.   When: Day of procedure, as soon as you get home.  How: Fill a plastic sandwich bag with crushed ice. Cover it with a small towel and apply to injection site.  How long: (15 min on, 15 min off) Apply for 15 minutes then remove x 15 minutes.  Repeat sequence on day of procedure, until you go to bed.  Apply heat:   Purpose: To treat any soreness and discomfort from the procedure.  When: Starting the next day after the procedure.  How: Apply heat to procedure site starting the day following the procedure.  How long: May continue to repeat daily, until discomfort goes away.  Food intake: Start with clear liquids (like water) and advance to regular food, as tolerated.   Physical activities: Keep activities to a minimum for the first 8 hours after the procedure. After that, then as tolerated.  Driving: If you have received any sedation, be responsible and do not drive. You are not allowed to drive for 24 hours after having sedation.  Blood thinner: (Applies only to those taking blood thinners) You may restart your blood thinner 6 hours after your procedure.  Insulin: (Applies only to Diabetic patients taking insulin) As soon as you can eat, you may resume your normal dosing schedule.  Infection prevention: Keep procedure site clean and dry. Shower daily and clean area with soap and water.  Post-procedure Pain Diary: Extremely important that this be done correctly and  accurately. Recorded information will be used to determine the next step in treatment. For the purpose of accuracy, follow these rules:  Evaluate only the area treated. Do not report or include pain from an untreated area. For the purpose of this evaluation, ignore all other areas of pain, except for the treated area.  After your procedure, avoid taking a long nap and attempting to complete the pain diary after you wake up. Instead, set your alarm clock to go off every hour, on the hour, for the initial 8 hours after the procedure. Document the duration of the numbing medicine, and the relief you are getting from it.  Do not go to sleep and attempt to complete it later. It will not be accurate. If you received sedation, it is likely that you were given a medication that may cause amnesia. Because of this, completing the diary at a later time may cause the information to be inaccurate. This information is needed to plan your care.  Follow-up appointment: Keep your post-procedure follow-up evaluation appointment after the procedure (usually 2 weeks for most procedures, 6 weeks for radiofrequencies). DO NOT FORGET to bring you pain diary with you.   Expect: (What should I expect to see with my procedure?)  From numbing medicine (AKA: Local Anesthetics): Numbness or decrease in pain. You may also experience some weakness, which if present, could last for the duration of the local anesthetic.  Onset: Full effect within 15 minutes of injected.  Duration: It will depend on the type of local anesthetic used. On the average, 1 to 8 hours.   From steroids (Applies only if steroids were  used): Decrease in swelling or inflammation. Once inflammation is improved, relief of the pain will follow.  Onset of benefits: Depends on the amount of swelling present. The more swelling, the longer it will take for the benefits to be seen. In some cases, up to 10 days.  Duration: Steroids will stay in the system x 2  weeks. Duration of benefits will depend on multiple posibilities including persistent irritating factors.  Side-effects: If present, they may typically last 2 weeks (the duration of the steroids).  Frequent: Cramps (if they occur, drink Gatorade and take over-the-counter Magnesium 450-500 mg once to twice a day); water retention with temporary weight gain; increases in blood sugar; decreased immune system response; increased appetite.  Occasional: Facial flushing (red, warm cheeks); mood swings; menstrual changes.  Uncommon: Long-term decrease or suppression of natural hormones; bone thinning. (These are more common with higher doses or more frequent use. This is why we prefer that our patients avoid having any injection therapies in other practices.)   Very Rare: Severe mood changes; psychosis; aseptic necrosis.  From procedure: Some discomfort is to be expected once the numbing medicine wears off. This should be minimal if ice and heat are applied as instructed.  Call if: (When should I call?)  You experience numbness and weakness that gets worse with time, as opposed to wearing off.  New onset bowel or bladder incontinence. (Applies only to procedures done in the spine)  Emergency Numbers:  Durning business hours (Monday - Thursday, 8:00 AM - 4:00 PM) (Friday, 9:00 AM - 12:00 Noon): [phone]  After hours: [phone]  NOTE: If you are having a problem and are unable connect with, or to talk to a provider, then go to your nearest urgent care or emergency department. If the problem is serious and urgent, please call 911. ____________________________________________________________________________________________

## 2020-10-08 ENCOUNTER — Telehealth: Payer: Self-pay

## 2020-10-08 NOTE — Telephone Encounter (Signed)
Post procedure phone call.  LM 

## 2020-10-12 ENCOUNTER — Other Ambulatory Visit: Payer: Self-pay | Admitting: Internal Medicine

## 2020-10-13 ENCOUNTER — Other Ambulatory Visit: Payer: Self-pay | Admitting: Internal Medicine

## 2020-10-13 DIAGNOSIS — Z1231 Encounter for screening mammogram for malignant neoplasm of breast: Secondary | ICD-10-CM

## 2020-11-30 NOTE — Progress Notes (Signed)
PROVIDER NOTE: Information contained herein reflects review and annotations entered in association with encounter. Interpretation of such information and data should be left to medically-trained personnel. Information provided to patient can be located elsewhere in the medical record under "Patient Instructions". Document created using STT-dictation technology, any transcriptional errors that may result from process are unintentional.    Patient: Wanda [name]  Service Category: E/M  Provider: Gaspar Cola, MD  DOB: 23-Aug-1945  DOS: 12/01/2020  Specialty: Interventional Pain Management  MRN: [id number]  Setting: Ambulatory outpatient  PCP: Colon Branch, MD  Type: Established Patient    Referring Provider: Colon Branch, MD  Location: Office  Delivery: Face-to-face     HPI  [name], a 75 y.o. year old female, is here today because of her Acute exacerbation of chronic low back pain [M54.50, G89.29]. Ms. Guimaraes primary complain today is Knee Pain (Left ), Back Pain (Lumbar bilateral ), and Hand Pain (Left hand, numbness ) Last encounter: My last encounter with her was on 10/07/2020. Pertinent problems: Ms. 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 with possible L3 nerve root compression); Chronic sacroiliac joint pain (Right); Chronic radicular pain of lower extremity (Left) (S1 dermatome); Osteoarthritis of hips (Bilateral); Chronic knee pain (1ry area of Pain) (Bilateral) (L>R); Osteoarthritis of knee (Bilateral); Chronic hip pain (Bilateral) (L>R); Chronic low back pain (Bilateral) (L>R) w/ sciatica (Left); Chronic musculoskeletal pain; Abnormal MRI, lumbar spine (03/20/2018);  Spondylosis 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 Pain Assessment: Severity of Chronic pain is reported as a 6 /10. Location: Knee (see visit info) Left/back pain goes all the way across.. Onset: More than a month ago. Quality: Discomfort,Constant,Aching,Burning,Sharp,Numbness. Timing: Constant. Modifying factor(s): medications, rest. Vitals:  height is $RemoveB'5\' 3"'pKVFdGZF$  (1.6 m) and weight is 230 lb (104.3 kg). Her temporal temperature is 97.1 F (36.2 C) (abnormal). Her blood pressure is 133/85 and her pulse is 67. Her respiration is 16 and oxygen saturation is 99%.   Reason for encounter: medication management. The patient indicates doing well with the current medication regimen. No adverse reactions or side effects reported to the medications.  The patient indicates that she has not taken her medications for a while since she ran out and did not have a return appointment.  She also cites the fact that her pharmacy no longer calls her when she has a refill on her medication ready.  I have reminded the patient that there will probably no longer do that and that it is her responsibility to call and ask whether or not she has any more prescriptions left.  I also reminded the patient that her AVS should have information about her prescriptions indicating how many and how long they should last.  She understood and accepted.  Reviewing the patient's PMP, the patient has had 12 prescriptions filled since  08/05/2019, all of them for 120  pills.  This is a total of 1440 pills that she has used since 08/05/2019, which is a period of 484 days.  If we divide 1440 pills by 484 days, this gives [place] an average of 2.97 pills/day or approximately 89.25 pills/month.  I will be adjusting her prescriptions today to reflect what she is actually been taking over the past year.  Today the patient comes in with acute on chronic low back pain and therefore we have decided to attempt breaking the pain cycle by providing her with an IM injection of Toradol/Norflex 60/60.  She indicated having no allergies to these medications and not having any renal problems.  In addition, she describes having bilateral low back pain affecting both sides with no lower extremity pain or numbness.  However, she has been noticing more numbness in her left upper extremity that seems to be affecting her middle finger, ring finger, and pinky finger and what appears to be a C8 dermatomal distribution.  She denies any significant neck pain and when asked if she wanted [place] to go ahead and schedule her for a cervical epidural, but she indicated that she wants [place] to work first on her lower back.  On the patient's  RTCB: 03/01/2021 Nonopioids transferred 05/03/2020: Flexeril, Neurontin, and calcium  Post-Procedure Evaluation  Procedure (10/07/2020): Therapeutic left IA Hyalgan knee injection #3, no fluoroscopy or IV sedation Pre-procedure pain level: 3/10 Post-procedure: 0/10 (100% relief)  Sedation: None.  Effectiveness during initial hour after procedure(Ultra-Short Term Relief): 100 %.  Local anesthetic used: Long-acting (4-6 hours) Effectiveness: Defined as any analgesic benefit obtained secondary to the administration of local anesthetics. This carries significant diagnostic value as to the etiological location, or anatomical origin, of the pain. Duration of benefit is expected to coincide with the duration of the local anesthetic used.   Effectiveness during initial 4-6 hours after procedure(Short-Term Relief): 100 %.  Long-term benefit: Defined as any relief past the pharmacologic duration of the local anesthetics.  Effectiveness past the initial 6 hours after procedure(Long-Term Relief): 0 % (knee was numb until the next day and then feeling came back.  reports no relief.).  Current benefits: Defined as benefit that persist at this time.   Analgesia:  She indicates not having attained any significant relief from the injection, except for the relief that she got while the numbing medicine was in effect.  This would suggest that the damage on her left knee is beyond what they yellowly days and treat.  For this reason, today I have talked to the patient about the possibility of doing a diagnostic left genicular nerve block. Function: No improvement ROM: No improvement  Pharmacotherapy Assessment   Analgesic: Hydrocodone/APAP 5/325, 1 tab PO q 8 hrs (15 mg/day of hydrocodone) MME/day:15 mg/day.   Monitoring: [place] PMP: PDMP reviewed during this encounter.       Pharmacotherapy: No side-effects or adverse reactions reported. Compliance: No problems identified. Effectiveness: Clinically acceptable.  Janett Billow, RN  12/01/2020  9:09 AM  Sign when Signing Visit Nursing Pain Medication Assessment:  Safety precautions to be maintained throughout the outpatient stay will include: orient to surroundings, keep bed in low position, maintain call bell within reach at all times, provide assistance with transfer out of bed and ambulation.  Medication Inspection Compliance: Pill count conducted under aseptic conditions, in front of the patient. Neither the pills nor the bottle was removed from the patient's sight at any time. Once count was completed pills were immediately returned to the patient  in their original bottle.  Medication: Hydrocodone/APAP Pill/Patch Count: 0 of 120 pills remain Pill/Patch Appearance: Markings  consistent with prescribed medication Bottle Appearance: Standard pharmacy container. Clearly labeled. Filled Date: 03 / 27 / 2022 Last Medication intake:  Ran out of medicine more than 48 hours ago    UDS:  Summary  Date Value Ref Range Status  02/04/2020 Note  Final    Comment:    ==================================================================== [employer] [facility] 13 (MW) ==================================================================== Test                             Result       Flag       Units  Drug Present and Declared for Prescription Verification   Hydrocodone                    723          EXPECTED   ng/mg creat   Hydromorphone                  138          EXPECTED   ng/mg creat   Dihydrocodeine                 73           EXPECTED   ng/mg creat   Norhydrocodone                 1435         EXPECTED   ng/mg creat    Sources of hydrocodone include scheduled prescription medications.    Hydromorphone, dihydrocodeine and norhydrocodone are expected    metabolites of hydrocodone. Hydromorphone and dihydrocodeine are    also available as scheduled prescription medications.  ==================================================================== Test                      Result    Flag   Units      Ref Range   Creatinine              133              mg/dL      >=91 ==================================================================== Declared Medications:  The flagging and interpretation on this report are based on the  following declared medications.  Unexpected results may arise from  inaccuracies in the declared medications.   **Note: The testing scope of this panel includes these medications:   Hydrocodone (Norco)   **Note: The testing scope of this panel does not include the  following reported medications:   Acetaminophen (Norco)  Aspirin  Atorvastatin (Lipitor)  Calcium  Cyclobenzaprine (Flexeril)  Gabapentin (Neurontin)  Hydrochlorothiazide  (Hydrodiuril)  Lisinopril (Zestril)  Sertraline (Zoloft)  Vitamin D  Vitamin D2 (Drisdol) ==================================================================== For clinical consultation, please call [phone]. ====================================================================      ROS  Constitutional: Denies any fever or chills Gastrointestinal: No reported hemesis, hematochezia, vomiting, or acute GI distress Musculoskeletal: Denies any acute onset joint swelling, redness, loss of ROM, or weakness Neurological: No reported episodes of acute onset apraxia, aphasia, dysarthria, agnosia, amnesia, paralysis, loss of coordination, or loss of consciousness  Medication Review  HYDROcodone-acetaminophen, Vitamin D, Vitamin D (Ergocalciferol), aspirin EC, atorvastatin, calcium carbonate, cyclobenzaprine, gabapentin, hydrochlorothiazide, lisinopril, and sertraline  History Review  Allergy: Ms. Wilcoxson is allergic to simvastatin. Drug: Ms. Perfecto  reports no history of drug use. Alcohol:  reports no history of alcohol use. Tobacco:  reports that she has never smoked. She has never used smokeless tobacco. Social: Ms. Nieland  reports that she has never smoked. She has never used smokeless tobacco. She reports that she does not drink alcohol and does not use drugs. Medical:  has a past medical history of Allergy, Chronic back pain, Depression, DJD (degenerative joint disease), Hyperlipidemia, Hypertension, Menopause, Musculoskeletal pain (12/07/2015), and TMJ PAIN (05/17/2010). Surgical: Ms. Westerhold  has a past surgical history that includes G3 P3 . Family: family history includes Breast cancer in her sister; Cancer in her brother and sister; Colon cancer in her brother; Diabetes in an other family member; Kidney cancer in her son.  Laboratory Chemistry Profile   Renal Lab Results  Component Value Date   BUN 10 01/16/2020   CREATININE 0.66 02/77/4128   BCR NOT APPLICABLE 78/67/6720    GFR 105.13 12/24/2018   GFRAA 103 02/13/2019   GFRNONAA 89 02/13/2019     Hepatic Lab Results  Component Value Date   AST 19 01/16/2020   ALT 22 01/16/2020   ALBUMIN 4.5 02/13/2019   ALKPHOS 87 02/13/2019   HCVAB NEGATIVE 08/30/2016     Electrolytes Lab Results  Component Value Date   NA 138 01/16/2020   K 4.3 01/16/2020   CL 103 01/16/2020   CALCIUM 9.8 01/16/2020   MG 1.8 02/13/2019     Bone Lab Results  Component Value Date   VD25OH 25 (L) 01/16/2020   VD125OH2TOT 68 12/24/2017   NO7096GE3 68 12/24/2017   VD2125OH2 <8 12/24/2017   25OHVITD1 22 (L) 02/13/2019   25OHVITD2 3.4 02/13/2019   25OHVITD3 19 02/13/2019     Inflammation (CRP: Acute Phase) (ESR: Chronic Phase) Lab Results  Component Value Date   CRP 8 02/13/2019   ESRSEDRATE 22 01/16/2020       Note: Above Lab results reviewed.  Recent Imaging Review  DG PAIN CLINIC C-ARM 1-60 MIN NO REPORT Fluoro was used, but no Radiologist interpretation will be provided.  Please refer to "NOTES" tab for provider progress note. Note: Reviewed        Physical Exam  General appearance: Well nourished, well developed, and well hydrated. In no apparent acute distress Mental status: Alert, oriented x 3 (person, place, & time)       Respiratory: No evidence of acute respiratory distress Eyes: PERLA Vitals: BP 133/85 (BP Location: Right Arm, Patient Position: Sitting, Cuff Size: Normal)   Pulse 67   Temp (!) 97.1 F (36.2 C) (Temporal)   Resp 16   Ht $R'5\' 3"'jd$  (1.6 m)   Wt 230 lb (104.3 kg)   SpO2 99%   BMI 40.74 kg/m  BMI: Estimated body mass index is 40.74 kg/m as calculated from the following:   Height as of this encounter: $RemoveBeforeD'5\' 3"'RJSbikZpInstbS$  (1.6 m).   Weight as of this encounter: 230 lb (104.3 kg). Ideal: Ideal body weight: 52.4 kg (115 lb 8.3 oz) Adjusted ideal body weight: 73.2 kg (161 lb 5 oz)  Assessment   Status Diagnosis  Worsening Unimproved Worsened 1. Acute exacerbation of chronic low back pain   2.  Chronic low back pain (Bilateral) (R>L) w/o sciatica   3. Chronic knee pain (1ry area of Pain) (Bilateral) (L>R)   4. Grade 1 (13mm) Anterolisthesis of L3 over L4.   5. Lumbar facet syndrome (Bilateral) (R>L)   6. Numbness and tingling of upper extremity (Left)   7. Cervical radiculopathy at C8 (Left)   8. Cervical radiculopathy at C7 (Left)   9. Chronic  pain syndrome   10. Pharmacologic therapy   11. Chronic use of opiate for therapeutic purpose   12. Uncomplicated opioid dependence ([place])      Updated Problems: Problem  Numbness and tingling of upper extremity (Left)   This is affecting the C7/C8 dermatomal distribution with numbness on her middle finger, ring finger, and pinky finger on the left arm.   Cervical radiculopathy at C8 (Left)  Cervical radiculopathy at C7 (Left)  Chronic Use of Opiate for Therapeutic Purpose    Plan of Care  Problem-specific:  No problem-specific Assessment & Plan notes found for this encounter.  Ms. ADELISA SATTERWHITE has a current medication list which includes the following long-term medication(s): atorvastatin, calcium carbonate, cyclobenzaprine, gabapentin, hydrochlorothiazide, hydrocodone-acetaminophen, [START ON 12/31/2020] hydrocodone-acetaminophen, [START ON 01/30/2021] hydrocodone-acetaminophen, lisinopril, and sertraline.  Pharmacotherapy (Medications Ordered): Meds ordered this encounter  Medications  . HYDROcodone-acetaminophen (NORCO/VICODIN) 5-325 MG tablet    Sig: Take 1 tablet by mouth 3 (three) times daily as needed for severe pain. Must last 30 days    Dispense:  90 tablet    Refill:  0    Not a duplicate. Do NOT delete! Dispense 1 day early if closed on refill date. Avoid benzodiazepines within 8 hours of opioids. Do not send refill requests.  [name] HYDROcodone-acetaminophen (NORCO/VICODIN) 5-325 MG tablet    Sig: Take 1 tablet by mouth 3 (three) times daily as needed for severe pain. Must last 30 days    Dispense:  90 tablet     Refill:  0    Not a duplicate. Do NOT delete! Dispense 1 day early if closed on refill date. Avoid benzodiazepines within 8 hours of opioids. Do not send refill requests.  [name] HYDROcodone-acetaminophen (NORCO/VICODIN) 5-325 MG tablet    Sig: Take 1 tablet by mouth 3 (three) times daily as needed for severe pain. Must last 30 days    Dispense:  90 tablet    Refill:  0    Not a duplicate. Do NOT delete! Dispense 1 day early if closed on refill date. Avoid benzodiazepines within 8 hours of opioids. Do not send refill requests.  [name] ketorolac (TORADOL) injection 60 mg  . orphenadrine (NORFLEX) injection 60 mg   Orders:  Orders Placed This Encounter  Procedures  . LUMBAR FACET(MEDIAL BRANCH NERVE BLOCK) MBNB    Standing Status:   Future    Standing Expiration Date:   01/31/2021    Scheduling Instructions:     Procedure: Lumbar facet block (AKA.: Lumbosacral medial branch nerve block)     Side: Bilateral     Level: L3-4, L4-5, & L5-S1 Facets (L2, L3, L4, L5, & S1 Medial Branch Nerves)     Sedation: Patient's choice.     Timeframe: ASAA    Order Specific Question:   Where will this procedure be performed?    Answer:   [facility]  . DG Cervical Spine With Flex & Extend    Patient presents with axial pain with possible radicular component.  Please evaluate for any evidence of cervical spine instability. Describe the presence of any spondylolisthesis (Antero- or retrolisthesis). If present, provide displacement "Grade" and measurement in cm. Please describe presence and specific location (Level & Laterality) of any signs of  osteoarthritis, zygapophyseal (Facet) joints DJD (including decreased joint space and/or osteophytosis), DDD, Foraminal narrowing, as well as any sclerosis and/or cyst formation. Please comment on ROM. In addition to any acute findings, please report on:  1. Facet (Zygapophyseal) joint DJD (Hypertrophy, space  narrowing, subchondral sclerosis, and/or osteophyte  formation) 2. DDD and/or IVDD (Loss of disc height, desiccation or "Black disc disease") 3. Pars defects 4. Spondylolisthesis, spondylosis, and/or spondyloarthropathies (include Degree/Grade of displacement in mm) 5. Vertebral body Fractures, including age (old, new/acute) 96. Modic Type Changes 7. Demineralization 8. Bone pathology 9. Central, Lateral Recess, and/or Foraminal Stenosis (include AP diameter of stenosis in mm) 10. Surgical changes (hardware type, status, and presence of fibrosis) NOTE: Please specify level(s) and laterality. If applicable: Please indicate ROM and/or evidence of instability (>22mm displacement between flexion and extension views)    Standing Status:   Future    Standing Expiration Date:   01/01/2021    Scheduling Instructions:     Imaging must be done as soon as possible. Inform patient that order will expire within 30 days and I will not renew it.    Order Specific Question:   Reason for Exam (SYMPTOM  OR DIAGNOSIS REQUIRED)    Answer:   Cervicalgia    Order Specific Question:   Preferred imaging location?    Answer:   [facility]    Order Specific Question:   Call Results- Best Contact Number?    Answer:   [phone] ([facility])    Order Specific Question:   Radiology Contrast Protocol - do NOT remove file path    Answer:   [email or web address]    Order Specific Question:   Release to patient    Answer:   Immediate   Follow-up plan:   Return for Procedure (w/ sedation): (B) L-FCT BLK .     Interventional Therapies  Risk  Complexity Considerations:   NOTE: NO LUMBAR RFA until BMI <35.   Planned  Pending:   Therapeutic/palliative bilateral lumbar facet MBB #R5L3  IM Toradol/Norflex 60/60 mg (12/01/2020)    Under consideration:   Diagnostic left genicular NB #1    Completed:   Therapeutic right lumbar facet MBB x4 (04/01/2020)  Therapeutic left lumbar facet MBB x2 (11/18/2019)  Therapeutic  right IA steroid knee injection x1 (08/15/2018)  Therapeutic left IA steroid knee injection x2 (08/15/2018)  Therapeutic left IA Hyalgan knee injection x9 (10/07/2020)    Therapeutic  Palliative (PRN) options:   Palliative left lumbar facet MBB #3  Palliative right lumbar facet MBB #5  Palliative left IA Hyalgan knee injection #10  Palliative left vs bilateral IA knee injection #3 (w/ steroid)     Recent Visits Date Type Provider Dept  10/07/20 Procedure visit Milinda Pointer, MD [facility]  09/23/20 Procedure visit Milinda Pointer, MD [facility]  09/09/20 Procedure visit Milinda Pointer, MD [facility]  Showing recent visits within past 90 days and meeting all other requirements Today's Visits Date Type Provider Dept  12/01/20 Office Visit Milinda Pointer, MD [facility]  Showing today's visits and meeting all other requirements Future Appointments No visits were found meeting these conditions. Showing future appointments within next 90 days and meeting all other requirements  I discussed the assessment and treatment plan with the patient. The patient was provided an opportunity to ask questions and all were answered. The patient agreed with the plan and demonstrated an understanding of the instructions.  Patient advised to call back or seek an in-person evaluation if the symptoms or condition worsens.  Duration of encounter: 49 minutes.  Note by: Gaspar Cola, MD Date: 12/01/2020; Time: 10:30 AM

## 2020-12-01 ENCOUNTER — Encounter: Payer: Self-pay | Admitting: Pain Medicine

## 2020-12-01 ENCOUNTER — Other Ambulatory Visit: Payer: Self-pay

## 2020-12-01 ENCOUNTER — Ambulatory Visit: Payer: Medicare HMO | Attending: Pain Medicine | Admitting: Pain Medicine

## 2020-12-01 VITALS — BP 133/85 | HR 67 | Temp 97.1°F | Resp 16 | Ht 63.0 in | Wt 230.0 lb

## 2020-12-01 DIAGNOSIS — M4310 Spondylolisthesis, site unspecified: Secondary | ICD-10-CM | POA: Diagnosis not present

## 2020-12-01 DIAGNOSIS — M25562 Pain in left knee: Secondary | ICD-10-CM | POA: Diagnosis present

## 2020-12-01 DIAGNOSIS — R200 Anesthesia of skin: Secondary | ICD-10-CM | POA: Diagnosis not present

## 2020-12-01 DIAGNOSIS — G894 Chronic pain syndrome: Secondary | ICD-10-CM

## 2020-12-01 DIAGNOSIS — F1120 Opioid dependence, uncomplicated: Secondary | ICD-10-CM

## 2020-12-01 DIAGNOSIS — R202 Paresthesia of skin: Secondary | ICD-10-CM | POA: Diagnosis not present

## 2020-12-01 DIAGNOSIS — M5412 Radiculopathy, cervical region: Secondary | ICD-10-CM | POA: Diagnosis not present

## 2020-12-01 DIAGNOSIS — G8929 Other chronic pain: Secondary | ICD-10-CM

## 2020-12-01 DIAGNOSIS — M25561 Pain in right knee: Secondary | ICD-10-CM

## 2020-12-01 DIAGNOSIS — M545 Low back pain, unspecified: Secondary | ICD-10-CM | POA: Diagnosis not present

## 2020-12-01 DIAGNOSIS — Z79891 Long term (current) use of opiate analgesic: Secondary | ICD-10-CM | POA: Diagnosis not present

## 2020-12-01 DIAGNOSIS — M47816 Spondylosis without myelopathy or radiculopathy, lumbar region: Secondary | ICD-10-CM | POA: Diagnosis not present

## 2020-12-01 DIAGNOSIS — Z79899 Other long term (current) drug therapy: Secondary | ICD-10-CM | POA: Diagnosis not present

## 2020-12-01 MED ORDER — ORPHENADRINE CITRATE 30 MG/ML IJ SOLN
60.0000 mg | Freq: Once | INTRAMUSCULAR | Status: AC
  Administered 2020-12-01: 60 mg via INTRAMUSCULAR

## 2020-12-01 MED ORDER — HYDROCODONE-ACETAMINOPHEN 5-325 MG PO TABS: 1.0000 | ORAL_TABLET | Freq: Three times a day (TID) | ORAL | 0 refills | Status: DC | PRN

## 2020-12-01 MED ORDER — KETOROLAC TROMETHAMINE 60 MG/2ML IM SOLN
INTRAMUSCULAR | Status: AC
  Filled 2020-12-01: qty 2

## 2020-12-01 MED ORDER — ORPHENADRINE CITRATE 30 MG/ML IJ SOLN
INTRAMUSCULAR | Status: AC
  Filled 2020-12-01: qty 2

## 2020-12-01 MED ORDER — KETOROLAC TROMETHAMINE 60 MG/2ML IM SOLN
60.0000 mg | Freq: Once | INTRAMUSCULAR | Status: AC
  Administered 2020-12-01: 60 mg via INTRAMUSCULAR

## 2020-12-01 NOTE — Patient Instructions (Addendum)
____________________________________________________________________________________________  Preparing for Procedure with Sedation  Procedure appointments are limited to planned procedures: . No Prescription Refills. . No disability issues will be discussed. . No medication changes will be discussed.  Instructions: . Oral Intake: Do not eat or drink anything for at least 8 hours prior to your procedure. (Exception: Blood Pressure Medication. See below.) . Transportation: Unless otherwise stated by your physician, you may drive yourself after the procedure. . Blood Pressure Medicine: Do not forget to take your blood pressure medicine with a sip of water the morning of the procedure. If your Diastolic (lower reading)is above 100 mmHg, elective cases will be cancelled/rescheduled. . Blood thinners: These will need to be stopped for procedures. Notify our staff if you are taking any blood thinners. Depending on which one you take, there will be specific instructions on how and when to stop it. . Diabetics on insulin: Notify the staff so that you can be scheduled 1st case in the morning. If your diabetes requires high dose insulin, take only  of your normal insulin dose the morning of the procedure and notify the staff that you have done so. . Preventing infections: Shower with an antibacterial soap the morning of your procedure. . Build-up your immune system: Take 1000 mg of Vitamin C with every meal (3 times a day) the day prior to your procedure. . Antibiotics: Inform the staff if you have a condition or reason that requires you to take antibiotics before dental procedures. . Pregnancy: If you are pregnant, call and cancel the procedure. . Sickness: If you have a cold, fever, or any active infections, call and cancel the procedure. . Arrival: You must be in the facility at least 30 minutes prior to your scheduled procedure. . Children: Do not bring children with you. . Dress appropriately:  Bring dark clothing that you would not mind if they get stained. . Valuables: Do not bring any jewelry or valuables.  Reasons to call and reschedule or cancel your procedure: (Following these recommendations will minimize the risk of a serious complication.) . Surgeries: Avoid having procedures within 2 weeks of any surgery. (Avoid for 2 weeks before or after any surgery). . Flu Shots: Avoid having procedures within 2 weeks of a flu shots or . (Avoid for 2 weeks before or after immunizations). . Barium: Avoid having a procedure within 7-10 days after having had a radiological study involving the use of radiological contrast. (Myelograms, Barium swallow or enema study). . Heart attacks: Avoid any elective procedures or surgeries for the initial 6 months after a "Myocardial Infarction" (Heart Attack). . Blood thinners: It is imperative that you stop these medications before procedures. Let us know if you if you take any blood thinner.  . Infection: Avoid procedures during or within two weeks of an infection (including chest colds or gastrointestinal problems). Symptoms associated with infections include: Localized redness, fever, chills, night sweats or profuse sweating, burning sensation when voiding, cough, congestion, stuffiness, runny nose, sore throat, diarrhea, nausea, vomiting, cold or Flu symptoms, recent or current infections. It is specially important if the infection is over the area that we intend to treat. . Heart and lung problems: Symptoms that may suggest an active cardiopulmonary problem include: cough, chest pain, breathing difficulties or shortness of breath, dizziness, ankle swelling, uncontrolled high or unusually low blood pressure, and/or palpitations. If you are experiencing any of these symptoms, cancel your procedure and contact your primary care physician for an evaluation.  Remember:  Regular Business hours are:    Monday to Thursday 8:00 AM to 4:00 PM  Provider's  Schedule: Besse Miron, MD:  Procedure days: Tuesday and Thursday 7:30 AM to 4:00 PM  Bilal Lateef, MD:  Procedure days: Monday and Wednesday 7:30 AM to 4:00 PM ____________________________________________________________________________________________   ____________________________________________________________________________________________  General Risks and Possible Complications  Patient Responsibilities: It is important that you read this as it is part of your informed consent. It is our duty to inform you of the risks and possible complications associated with treatments offered to you. It is your responsibility as a patient to read this and to ask questions about anything that is not clear or that you believe was not covered in this document.  Patient's Rights: You have the right to refuse treatment. You also have the right to change your mind, even after initially having agreed to have the treatment done. However, under this last option, if you wait until the last second to change your mind, you may be charged for the materials used up to that point.  Introduction: Medicine is not an exact science. Everything in Medicine, including the lack of treatment(s), carries the potential for danger, harm, or loss (which is by definition: Risk). In Medicine, a complication is a secondary problem, condition, or disease that can aggravate an already existing one. All treatments carry the risk of possible complications. The fact that a side effects or complications occurs, does not imply that the treatment was conducted incorrectly. It must be clearly understood that these can happen even when everything is done following the highest safety standards.  No treatment: You can choose not to proceed with the proposed treatment alternative. The "PRO(s)" would include: avoiding the risk of complications associated with the therapy. The "CON(s)" would include: not getting any of the treatment  benefits. These benefits fall under one of three categories: diagnostic; therapeutic; and/or palliative. Diagnostic benefits include: getting information which can ultimately lead to improvement of the disease or symptom(s). Therapeutic benefits are those associated with the successful treatment of the disease. Finally, palliative benefits are those related to the decrease of the primary symptoms, without necessarily curing the condition (example: decreasing the pain from a flare-up of a chronic condition, such as incurable terminal cancer).  General Risks and Complications: These are associated to most interventional treatments. They can occur alone, or in combination. They fall under one of the following six (6) categories: no benefit or worsening of symptoms; bleeding; infection; nerve damage; allergic reactions; and/or death. 1. No benefits or worsening of symptoms: In Medicine there are no guarantees, only probabilities. No healthcare provider can ever guarantee that a medical treatment will work, they can only state the probability that it may. Furthermore, there is always the possibility that the condition may worsen, either directly, or indirectly, as a consequence of the treatment. 2. Bleeding: This is more common if the patient is taking a blood thinner, either prescription or over the counter (example: Goody Powders, Fish oil, Aspirin, Garlic, etc.), or if suffering a condition associated with impaired coagulation (example: Hemophilia, cirrhosis of the liver, low platelet counts, etc.). However, even if you do not have one on these, it can still happen. If you have any of these conditions, or take one of these drugs, make sure to notify your treating physician. 3. Infection: This is more common in patients with a compromised immune system, either due to disease (example: diabetes, cancer, human immunodeficiency virus [HIV], etc.), or due to medications or treatments (example: therapies used to treat  cancer and   rheumatological diseases). However, even if you do not have one on these, it can still happen. If you have any of these conditions, or take one of these drugs, make sure to notify your treating physician. 4. Nerve Damage: This is more common when the treatment is an invasive one, but it can also happen with the use of medications, such as those used in the treatment of cancer. The damage can occur to small secondary nerves, or to large primary ones, such as those in the spinal cord and brain. This damage may be temporary or permanent and it may lead to impairments that can range from temporary numbness to permanent paralysis and/or brain death. 5. Allergic Reactions: Any time a substance or material comes in contact with our body, there is the possibility of an allergic reaction. These can range from a mild skin rash (contact dermatitis) to a severe systemic reaction (anaphylactic reaction), which can result in death. 6. Death: In general, any medical intervention can result in death, most of the time due to an unforeseen complication. ____________________________________________________________________________________________   ____________________________________________________________________________________________  Drug Holidays (Slow)  What is a "Drug Holiday"? Drug Holiday: is the name given to the period of time during which a patient stops taking a medication(s) for the purpose of eliminating tolerance to the drug.  Benefits . Improved effectiveness of opioids. . Decreased opioid dose needed to achieve benefits. . Improved pain with lesser dose.  What is tolerance? Tolerance: is the progressive decreased in effectiveness of a drug due to its repetitive use. With repetitive use, the body gets use to the medication and as a consequence, it loses its effectiveness. This is a common problem seen with opioid pain medications. As a result, a larger dose of the drug is needed to  achieve the same effect that used to be obtained with a smaller dose.  How long should a "Drug Holiday" last? You should stay off of the pain medicine for at least 14 consecutive days. (2 weeks)  Should I stop the medicine "cold [place]"? No. You should always coordinate with your Pain Specialist so that he/she can provide you with the correct medication dose to make the transition as smoothly as possible.  How do I stop the medicine? Slowly. You will be instructed to decrease the daily amount of pills that you take by one (1) pill every seven (7) days. This is called a "slow downward taper" of your dose. For example: if you normally take four (4) pills per day, you will be asked to drop this dose to three (3) pills per day for seven (7) days, then to two (2) pills per day for seven (7) days, then to one (1) per day for seven (7) days, and at the end of those last seven (7) days, this is when the "Drug Holiday" would start.   Will I have withdrawals? By doing a "slow downward taper" like this one, it is unlikely that you will experience any significant withdrawal symptoms. Typically, what triggers withdrawals is the sudden stop of a high dose opioid therapy. Withdrawals can usually be avoided by slowly decreasing the dose over a prolonged period of time. If you do not follow these instructions and decide to stop your medication abruptly, withdrawals may be possible.  What are withdrawals? Withdrawals: refers to the wide range of symptoms that occur after stopping or dramatically reducing opiate drugs after heavy and prolonged use. Withdrawal symptoms do not occur to patients that use low dose opioids, or those who  take the medication sporadically. Contrary to benzodiazepine (example: Valium, Xanax, etc.) or alcohol withdrawals ("Delirium Tremens"), opioid withdrawals are not lethal. Withdrawals are the physical manifestation of the body getting rid of the excess receptors.  Expected Symptoms Early  symptoms of withdrawal may include: . Agitation . Anxiety . Muscle aches . Increased tearing . Insomnia . Runny nose . Sweating . Yawning  Late symptoms of withdrawal may include: . Abdominal cramping . Diarrhea . Dilated pupils . Goose bumps . Nausea . Vomiting  Will I experience withdrawals? Due to the slow nature of the taper, it is very unlikely that you will experience any.  What is a slow taper? Taper: refers to the gradual decrease in dose.  (Last update: 02/11/2020) ____________________________________________________________________________________________    ____________________________________________________________________________________________  Medication Recommendations and Reminders  Applies to: All patients receiving prescriptions (written and/or electronic).  Medication Rules & Regulations: These rules and regulations exist for your safety and that of others. They are not flexible and neither are we. Dismissing or ignoring them will be considered "non-compliance" with medication therapy, resulting in complete and irreversible termination of such therapy. (See document titled "Medication Rules" for more details.) In all conscience, because of safety reasons, we cannot continue providing a therapy where the patient does not follow instructions.  Pharmacy of record:   Definition: This is the pharmacy where your electronic prescriptions will be sent.   We do not endorse any particular pharmacy, however, we have experienced problems with [facility] not securing enough medication supply for the community.  We do not restrict you in your choice of pharmacy. However, once we write for your prescriptions, we will NOT be re-sending more prescriptions to fix restricted supply problems created by your pharmacy, or your insurance.   The pharmacy listed in the electronic medical record should be the one where you want electronic prescriptions to be sent.  If you  choose to change pharmacy, simply notify our nursing staff.  Recommendations:  Keep all of your pain medications in a safe place, under lock and key, even if you live alone. We will NOT replace lost, stolen, or damaged medication.  After you fill your prescription, take 1 week's worth of pills and put them away in a safe place. You should keep a separate, properly labeled bottle for this purpose. The remainder should be kept in the original bottle. Use this as your primary supply, until it runs out. Once it's gone, then you know that you have 1 week's worth of medicine, and it is time to come in for a prescription refill. If you do this correctly, it is unlikely that you will ever run out of medicine.  To make sure that the above recommendation works, it is very important that you make sure your medication refill appointments are scheduled at least 1 week before you run out of medicine. To do this in an effective manner, make sure that you do not leave the office without scheduling your next medication management appointment. Always ask the nursing staff to show you in your prescription , when your medication will be running out. Then arrange for the receptionist to get you a return appointment, at least 7 days before you run out of medicine. Do not wait until you have 1 or 2 pills left, to come in. This is very poor planning and does not take into consideration that we may need to cancel appointments due to bad weather, sickness, or emergencies affecting our staff.  DO NOT ACCEPT A "Partial Fill":  If for any reason your pharmacy does not have enough pills/tablets to completely fill or refill your prescription, do not allow for a "partial fill". The law allows the pharmacy to complete that prescription within 72 hours, without requiring a new prescription. If they do not fill the rest of your prescription within those 72 hours, you will need a separate prescription to fill the remaining amount, which we  will NOT provide. If the reason for the partial fill is your insurance, you will need to talk to the pharmacist about payment alternatives for the remaining tablets, but again, DO NOT ACCEPT A PARTIAL FILL, unless you can trust your pharmacist to obtain the remainder of the pills within 72 hours.  Prescription refills and/or changes in medication(s):   Prescription refills, and/or changes in dose or medication, will be conducted only during scheduled medication management appointments. (Applies to both, written and electronic prescriptions.)  No refills on procedure days. No medication will be changed or started on procedure days. No changes, adjustments, and/or refills will be conducted on a procedure day. Doing so will interfere with the diagnostic portion of the procedure.  No phone refills. No medications will be "called into the pharmacy".  No Fax refills.  No weekend refills.  No Holliday refills.  No after hours refills.  Remember:  Business hours are:  Monday to Thursday 8:00 AM to 4:00 PM Provider's Schedule: Milinda Pointer, MD - Appointments are:  Medication management: Monday and Wednesday 8:00 AM to 4:00 PM Procedure day: Tuesday and Thursday 7:30 AM to 4:00 PM Gillis Santa, MD - Appointments are:  Medication management: Tuesday and Thursday 8:00 AM to 4:00 PM Procedure day: Monday and Wednesday 7:30 AM to 4:00 PM (Last update: 02/11/2020) ____________________________________________________________________________________________   ____________________________________________________________________________________________  CBD (cannabidiol) WARNING  Applicable to: All individuals currently taking or considering taking CBD (cannabidiol) and, more important, all patients taking opioid analgesic controlled substances (pain medication). (Example: oxycodone; oxymorphone; hydrocodone; hydromorphone; morphine; methadone; tramadol; tapentadol; fentanyl; buprenorphine;  butorphanol; dextromethorphan; meperidine; codeine; etc.)  Legal status: CBD remains a Schedule I drug prohibited for any use. CBD is illegal with one exception. In the [place], CBD has a limited Transport planner (FDA) approval for the treatment of two specific types of epilepsy disorders. Only one CBD product has been approved by the FDA for this purpose: "Epidiolex". FDA is aware that some companies are marketing products containing cannabis and cannabis-derived compounds in ways that violate the [employer], Drug and Cosmetic Act [facility] Act) and that may put the health and safety of consumers at risk. The FDA, a Federal agency, has not enforced the CBD status since 2018.   Legality: Some manufacturers ship CBD products nationally, which is illegal. Often such products are sold online and are therefore available throughout the country. CBD is openly sold in head shops and health food stores in some states where such sales have not been explicitly legalized. Selling unapproved products with unsubstantiated therapeutic claims is not only a violation of the law, but also can put patients at risk, as these products have not been proven to be safe or effective. Federal illegality makes it difficult to conduct research on CBD.  Reference: "FDA Regulation of Cannabis and Cannabis-Derived Products, Including Cannabidiol (CBD)" - [employer]  Warning: CBD is not FDA approved and has not undergo the same manufacturing controls as prescription drugs.  This means that the purity and safety of available CBD may be questionable. Most of the time, despite manufacturer's claims, it  is contaminated with THC (delta-9-tetrahydrocannabinol - the chemical in marijuana responsible for the "HIGH").  When this is the case, the [facility] contaminant will trigger a positive urine drug screen (UDS) test  for Marijuana (carboxy-THC). Because a positive UDS for any illicit substance is a violation of our medication agreement, your opioid analgesics (pain medicine) may be permanently discontinued.  MORE ABOUT CBD  General Information: CBD  is a derivative of the Marijuana (cannabis sativa) plant discovered in 14. It is one of the 113 identified substances found in Marijuana. It accounts for up to 40% of the plant's extract. As of 2018, preliminary clinical studies on CBD included research for the treatment of anxiety, movement disorders, and pain. CBD is available and consumed in multiple forms, including inhalation of smoke or vapor, as an aerosol spray, and by mouth. It may be supplied as an oil containing CBD, capsules, dried cannabis, or as a liquid solution. CBD is thought not to be as psychoactive as THC (delta-9-tetrahydrocannabinol - the chemical in marijuana responsible for the "HIGH"). Studies suggest that CBD may interact with different biological target receptors in the body, including cannabinoid and other neurotransmitter receptors. As of 2018 the mechanism of action for its biological effects has not been determined.  Side-effects  Adverse reactions: Dry mouth, diarrhea, decreased appetite, fatigue, drowsiness, malaise, weakness, sleep disturbances, and others.  Drug interactions: CBC may interact with other medications such as blood-thinners. (Last update: 02/28/2020) ____________________________________________________________________________________________   ____________________________________________________________________________________________  Medication Rules  Purpose: To inform patients, and their family members, of our rules and regulations.  Applies to: All patients receiving prescriptions (written or electronic).  Pharmacy of record: Pharmacy where electronic prescriptions will be sent. If written prescriptions are taken to a different pharmacy, please inform the  nursing staff. The pharmacy listed in the electronic medical record should be the one where you would like electronic prescriptions to be sent.  Electronic prescriptions: In compliance with the [place] (STOP) Act of 2017 (Session Lanny Cramp [phone]), effective July 24, 2018, all controlled substances must be electronically prescribed. Calling prescriptions to the pharmacy will cease to exist.  Prescription refills: Only during scheduled appointments. Applies to all prescriptions.  NOTE: The following applies primarily to controlled substances (Opioid* Pain Medications).   Type of encounter (visit): For patients receiving controlled substances, face-to-face visits are required. (Not an option or up to the patient.)  Patient's responsibilities: 1. Pain Pills: Bring all pain pills to every appointment (except for procedure appointments). 2. Pill Bottles: Bring pills in original pharmacy bottle. Always bring the newest bottle. Bring bottle, even if empty. 3. Medication refills: You are responsible for knowing and keeping track of what medications you take and those you need refilled. The day before your appointment: write a list of all prescriptions that need to be refilled. The day of the appointment: give the list to the admitting nurse. Prescriptions will be written only during appointments. No prescriptions will be written on procedure days. If you forget a medication: it will not be "Called in", "Faxed", or "electronically sent". You will need to get another appointment to get these prescribed. No early refills. Do not call asking to have your prescription filled early. 4. Prescription Accuracy: You are responsible for carefully inspecting your prescriptions before leaving our office. Have the discharge nurse carefully go over each prescription with you, before taking them home. Make sure that your name is accurately spelled, that your address is correct.  Check the name and dose of  your medication to make sure it is accurate. Check the number of pills, and the written instructions to make sure they are clear and accurate. Make sure that you are given enough medication to last until your next medication refill appointment. 5. Taking Medication: Take medication as prescribed. When it comes to controlled substances, taking less pills or less frequently than prescribed is permitted and encouraged. Never take more pills than instructed. Never take medication more frequently than prescribed.  6. Inform other Doctors: Always inform, all of your healthcare providers, of all the medications you take. 7. Pain Medication from other Providers: You are not allowed to accept any additional pain medication from any other Doctor or Healthcare provider. There are two exceptions to this rule. (see below) In the event that you require additional pain medication, you are responsible for notifying us, as stated below. 8. Cough Medicine: Often these contain an opioid, such as codeine or hydrocodone. Never accept or take cough medicine containing these opioids if you are already taking an opioid* medication. The combination may cause respiratory failure and death. 9. Medication Agreement: You are responsible for carefully reading and following our Medication Agreement. This must be signed before receiving any prescriptions from our practice. Safely store a copy of your signed Agreement. Violations to the Agreement will result in no further prescriptions. (Additional copies of our Medication Agreement are available upon request.) 10. Laws, Rules, & Regulations: All patients are expected to follow all Federal and [employer], [employer], Rules, [employer]. Ignorance of the Laws does not constitute a valid excuse.  11. Illegal drugs and Controlled Substances: The use of illegal substances (including, but not limited to marijuana and its derivatives) and/or the illegal use of any  controlled substances is strictly prohibited. Violation of this rule may result in the immediate and permanent discontinuation of any and all prescriptions being written by our practice. The use of any illegal substances is prohibited. 12. Adopted CDC guidelines & recommendations: Target dosing levels will be at or below 60 MME/day. Use of benzodiazepines** is not recommended.  Exceptions: There are only two exceptions to the rule of not receiving pain medications from other Healthcare Providers. 1. Exception #1 (Emergencies): In the event of an emergency (i.e.: accident requiring emergency care), you are allowed to receive additional pain medication. However, you are responsible for: As soon as you are able, call our office [phone], at any time of the day or night, and leave a message stating your name, the date and nature of the emergency, and the name and dose of the medication prescribed. In the event that your call is answered by a member of our staff, make sure to document and save the date, time, and the name of the person that took your information.  2. Exception #2 (Planned Surgery): In the event that you are scheduled by another doctor or dentist to have any type of surgery or procedure, you are allowed (for a period no longer than 30 days), to receive additional pain medication, for the acute post-op pain. However, in this case, you are responsible for picking up a copy of our "Post-op Pain Management for Surgeons" handout, and giving it to your surgeon or dentist. This document is available at our office, and does not require an appointment to obtain it. Simply go to our office during business hours (Monday-Thursday from 8:00 AM to 4:00 PM) (Friday 8:00 AM to 12:00 Noon) or if you have a scheduled appointment with [place], prior to your surgery,  and ask for it by name. In addition, you are responsible for: calling our office [phone], at any time of the day or night, and leaving a message  stating your name, name of your surgeon, type of surgery, and date of procedure or surgery. Failure to comply with your responsibilities may result in termination of therapy involving the controlled substances.  *Opioid medications include: morphine, codeine, oxycodone, oxymorphone, hydrocodone, hydromorphone, meperidine, tramadol, tapentadol, buprenorphine, fentanyl, methadone. **Benzodiazepine medications include: diazepam (Valium), alprazolam (Xanax), clonazepam (Klonopine), lorazepam (Ativan), clorazepate (Tranxene), chlordiazepoxide (Librium), estazolam (Prosom), oxazepam (Serax), temazepam (Restoril), triazolam (Halcion) (Last updated: 06/21/2020) ____________________________________________________________________________________________   ____________________________________________________________________________________________  General Risks and Possible Complications  Patient Responsibilities: It is important that you read this as it is part of your informed consent. It is our duty to inform you of the risks and possible complications associated with treatments offered to you. It is your responsibility as a patient to read this and to ask questions about anything that is not clear or that you believe was not covered in this document.  Patient's Rights: You have the right to refuse treatment. You also have the right to change your mind, even after initially having agreed to have the treatment done. However, under this last option, if you wait until the last second to change your mind, you may be charged for the materials used up to that point.  Introduction: Medicine is not an Chief Strategy Officer. Everything in Medicine, including the lack of treatment(s), carries the potential for danger, harm, or loss (which is by definition: Risk). In Medicine, a complication is a secondary problem, condition, or disease that can aggravate an already existing one. All treatments carry the risk of possible  complications. The fact that a side effects or complications occurs, does not imply that the treatment was conducted incorrectly. It must be clearly understood that these can happen even when everything is done following the highest safety standards.  No treatment: You can choose not to proceed with the proposed treatment alternative. The "PRO(s)" would include: avoiding the risk of complications associated with the therapy. The "CON(s)" would include: not getting any of the treatment benefits. These benefits fall under one of three categories: diagnostic; therapeutic; and/or palliative. Diagnostic benefits include: getting information which can ultimately lead to improvement of the disease or symptom(s). Therapeutic benefits are those associated with the successful treatment of the disease. Finally, palliative benefits are those related to the decrease of the primary symptoms, without necessarily curing the condition (example: decreasing the pain from a flare-up of a chronic condition, such as incurable terminal cancer).  General Risks and Complications: These are associated to most interventional treatments. They can occur alone, or in combination. They fall under one of the following six (6) categories: no benefit or worsening of symptoms; bleeding; infection; nerve damage; allergic reactions; and/or death. 7. No benefits or worsening of symptoms: In Medicine there are no guarantees, only probabilities. No healthcare provider can ever guarantee that a medical treatment will work, they can only state the probability that it may. Furthermore, there is always the possibility that the condition may worsen, either directly, or indirectly, as a consequence of the treatment. 8. Bleeding: This is more common if the patient is taking a blood thinner, either prescription or over the counter (example: Goody Powders, Fish oil, Aspirin, Garlic, etc.), or if suffering a condition associated with impaired coagulation  (example: Hemophilia, cirrhosis of the liver, low platelet counts, etc.). However, even if you do not have one on  these, it can still happen. If you have any of these conditions, or take one of these drugs, make sure to notify your treating physician. 9. Infection: This is more common in patients with a compromised immune system, either due to disease (example: diabetes, cancer, human immunodeficiency virus [HIV], etc.), or due to medications or treatments (example: therapies used to treat cancer and rheumatological diseases). However, even if you do not have one on these, it can still happen. If you have any of these conditions, or take one of these drugs, make sure to notify your treating physician. 10. Nerve Damage: This is more common when the treatment is an invasive one, but it can also happen with the use of medications, such as those used in the treatment of cancer. The damage can occur to small secondary nerves, or to large primary ones, such as those in the spinal cord and brain. This damage may be temporary or permanent and it may lead to impairments that can range from temporary numbness to permanent paralysis and/or brain death. 11. Allergic Reactions: Any time a substance or material comes in contact with our body, there is the possibility of an allergic reaction. These can range from a mild skin rash (contact dermatitis) to a severe systemic reaction (anaphylactic reaction), which can result in death. 12. Death: In general, any medical intervention can result in death, most of the time due to an unforeseen complication. ____________________________________________________________________________________________  ____________________________________________________________________________________________  Preparing for Procedure with Sedation  Procedure appointments are limited to planned procedures: . No Prescription Refills. . No disability issues will be discussed. . No medication  changes will be discussed.  Instructions: . Oral Intake: Do not eat or drink anything for at least 8 hours prior to your procedure. (Exception: Blood Pressure Medication. See below.) . Transportation: Unless otherwise stated by your physician, you may drive yourself after the procedure. . Blood Pressure Medicine: Do not forget to take your blood pressure medicine with a sip of water the morning of the procedure. If your Diastolic (lower reading)is above 100 mmHg, elective cases will be cancelled/rescheduled. . Blood thinners: These will need to be stopped for procedures. Notify our staff if you are taking any blood thinners. Depending on which one you take, there will be specific instructions on how and when to stop it. . Diabetics on insulin: Notify the staff so that you can be scheduled 1st case in the morning. If your diabetes requires high dose insulin, take only  of your normal insulin dose the morning of the procedure and notify the staff that you have done so. . Preventing infections: Shower with an antibacterial soap the morning of your procedure. . Build-up your immune system: Take 1000 mg of Vitamin C with every meal (3 times a day) the day prior to your procedure. [name] Antibiotics: Inform the staff if you have a condition or reason that requires you to take antibiotics before dental procedures. . Pregnancy: If you are pregnant, call and cancel the procedure. . Sickness: If you have a cold, fever, or any active infections, call and cancel the procedure. . Arrival: You must be in the facility at least 30 minutes prior to your scheduled procedure. . Children: Do not bring children with you. . Dress appropriately: Bring dark clothing that you would not mind if they get stained. . Valuables: Do not bring any jewelry or valuables.  Reasons to call and reschedule or cancel your procedure: (Following these recommendations will minimize the risk of a serious complication.) .  Surgeries: Avoid  having procedures within 2 weeks of any surgery. (Avoid for 2 weeks before or after any surgery). . Flu Shots: Avoid having procedures within 2 weeks of a flu shots or . (Avoid for 2 weeks before or after immunizations). . Barium: Avoid having a procedure within 7-10 days after having had a radiological study involving the use of radiological contrast. (Myelograms, Barium swallow or enema study). . Heart attacks: Avoid any elective procedures or surgeries for the initial 6 months after a "Myocardial Infarction" (Heart Attack). . Blood thinners: It is imperative that you stop these medications before procedures. Let us know if you if you take any blood thinner.  . Infection: Avoid procedures during or within two weeks of an infection (including chest colds or gastrointestinal problems). Symptoms associated with infections include: Localized redness, fever, chills, night sweats or profuse sweating, burning sensation when voiding, cough, congestion, stuffiness, runny nose, sore throat, diarrhea, nausea, vomiting, cold or Flu symptoms, recent or current infections. It is specially important if the infection is over the area that we intend to treat. [name] Heart and lung problems: Symptoms that may suggest an active cardiopulmonary problem include: cough, chest pain, breathing difficulties or shortness of breath, dizziness, ankle swelling, uncontrolled high or unusually low blood pressure, and/or palpitations. If you are experiencing any of these symptoms, cancel your procedure and contact your primary care physician for an evaluation.  Remember:  Regular Business hours are:  Monday to Thursday 8:00 AM to 4:00 PM  Provider's Schedule: Milinda Pointer, MD:  Procedure days: Tuesday and Thursday 7:30 AM to 4:00 PM  Gillis Santa, MD:  Procedure days: Monday and Wednesday 7:30 AM to 4:00 PM ____________________________________________________________________________________________  Facet Blocks Patient  Information  Description: The facets are joints in the spine between the vertebrae.  Like any joints in the body, facets can become irritated and painful.  Arthritis can also effect the facets.  By injecting steroids and local anesthetic in and around these joints, we can temporarily block the nerve supply to them.  Steroids act directly on irritated nerves and tissues to reduce selling and inflammation which often leads to decreased pain.  Facet blocks may be done anywhere along the spine from the neck to the low back depending upon the location of your pain.   After numbing the skin with local anesthetic (like Novocaine), a small needle is passed onto the facet joints under x-ray guidance.  You may experience a sensation of pressure while this is being done.  The entire block usually lasts about 15-25 minutes.   Conditions which may be treated by facet blocks:   Low back/buttock pain  Neck/shoulder pain  Certain types of headaches  Preparation for the injection:  1. Do not eat any solid food or dairy products within 8 hours of your appointment. 2. You may drink clear liquid up to 3 hours before appointment.  Clear liquids include water, black coffee, juice or soda.  No milk or cream please. 3. You may take your regular medication, including pain medications, with a sip of water before your appointment.  Diabetics should hold regular insulin (if taken separately) and take 1/2 normal NPH dose the morning of the procedure.  Carry some sugar containing items with you to your appointment. 4. A driver must accompany you and be prepared to drive you home after your procedure. 5. Bring all your current medications with you. 6. An IV may be inserted and sedation may be given at the discretion of the physician.  7. A blood pressure cuff, EKG and other monitors will often be applied during the procedure.  Some patients may need to have extra oxygen administered for a short period. 8. You will be asked  to provide medical information, including your allergies and medications, prior to the procedure.  We must know immediately if you are taking blood thinners (like Coumadin/Warfarin) or if you are allergic to IV iodine contrast (dye).  We must know if you could possible be pregnant.  Possible side-effects:   Bleeding from needle site  Infection (rare, may require surgery)  Nerve injury (rare)  Numbness & tingling (temporary)  Difficulty urinating (rare, temporary)  Spinal headache (a headache worse with upright posture)  Light-headedness (temporary)  Pain at injection site (serveral days)  Decreased blood pressure (rare, temporary)  Weakness in arm/leg (temporary)  Pressure sensation in back/neck (temporary)   Call if you experience:   Fever/chills associated with headache or increased back/neck pain  Headache worsened by an upright position  New onset, weakness or numbness of an extremity below the injection site  Hives or difficulty breathing (go to the emergency room)  Inflammation or drainage at the injection site(s)  Severe back/neck pain greater than usual  New symptoms which are concerning to you  Please note:  Although the local anesthetic injected can often make your back or neck feel good for several hours after the injection, the pain will likely return. It takes 3-7 days for steroids to work.  You may not notice any pain relief for at least one week.  If effective, we will often do a series of 2-3 injections spaced 3-6 weeks apart to maximally decrease your pain.  After the initial series, you may be a candidate for a more permanent nerve block of the facets.  If you have any questions, please call [phone] [facility]

## 2020-12-01 NOTE — Progress Notes (Signed)
Nursing Pain Medication Assessment:  Safety precautions to be maintained throughout the outpatient stay will include: orient to surroundings, keep bed in low position, maintain call bell within reach at all times, provide assistance with transfer out of bed and ambulation.  Medication Inspection Compliance: Pill count conducted under aseptic conditions, in front of the patient. Neither the pills nor the bottle was removed from the patient's sight at any time. Once count was completed pills were immediately returned to the patient in their original bottle.  Medication: Hydrocodone/APAP Pill/Patch Count: 0 of 120 pills remain Pill/Patch Appearance: Markings consistent with prescribed medication Bottle Appearance: Standard pharmacy container. Clearly labeled. Filled Date: 03 / 27 / 2022 Last Medication intake:  Ran out of medicine more than 48 hours ago

## 2020-12-06 ENCOUNTER — Other Ambulatory Visit: Payer: Self-pay

## 2020-12-06 ENCOUNTER — Ambulatory Visit: Payer: Medicare HMO

## 2020-12-06 ENCOUNTER — Ambulatory Visit
Admission: RE | Admit: 2020-12-06 | Discharge: 2020-12-06 | Disposition: A | Discharge status: Home / Self Care | Payer: Medicare HMO | Source: Ambulatory Visit | Attending: Internal Medicine | Admitting: Internal Medicine

## 2020-12-06 DIAGNOSIS — Z1231 Encounter for screening mammogram for malignant neoplasm of breast: Secondary | ICD-10-CM

## 2020-12-08 ENCOUNTER — Other Ambulatory Visit: Payer: Self-pay | Admitting: Internal Medicine

## 2020-12-23 ENCOUNTER — Encounter: Payer: Self-pay | Admitting: Pain Medicine

## 2020-12-23 ENCOUNTER — Other Ambulatory Visit: Payer: Self-pay

## 2020-12-23 ENCOUNTER — Ambulatory Visit (HOSPITAL_BASED_OUTPATIENT_CLINIC_OR_DEPARTMENT_OTHER): Payer: Medicare HMO | Admitting: Pain Medicine

## 2020-12-23 ENCOUNTER — Ambulatory Visit
Admission: RE | Admit: 2020-12-23 | Discharge: 2020-12-23 | Disposition: A | Discharge status: Home / Self Care | Payer: Medicare HMO | Source: Ambulatory Visit | Attending: Pain Medicine | Admitting: Pain Medicine

## 2020-12-23 VITALS — BP 102/74 | HR 72 | Temp 97.1°F | Resp 20 | Ht 64.0 in | Wt 230.0 lb

## 2020-12-23 DIAGNOSIS — M545 Low back pain, unspecified: Secondary | ICD-10-CM | POA: Insufficient documentation

## 2020-12-23 DIAGNOSIS — G8929 Other chronic pain: Secondary | ICD-10-CM | POA: Insufficient documentation

## 2020-12-23 DIAGNOSIS — M47817 Spondylosis without myelopathy or radiculopathy, lumbosacral region: Secondary | ICD-10-CM | POA: Insufficient documentation

## 2020-12-23 DIAGNOSIS — M4316 Spondylolisthesis, lumbar region: Secondary | ICD-10-CM | POA: Diagnosis not present

## 2020-12-23 DIAGNOSIS — M9904 Segmental and somatic dysfunction of sacral region: Secondary | ICD-10-CM | POA: Insufficient documentation

## 2020-12-23 DIAGNOSIS — M5135 Other intervertebral disc degeneration, thoracolumbar region: Secondary | ICD-10-CM | POA: Insufficient documentation

## 2020-12-23 DIAGNOSIS — M4317 Spondylolisthesis, lumbosacral region: Secondary | ICD-10-CM | POA: Insufficient documentation

## 2020-12-23 DIAGNOSIS — Z6841 Body Mass Index (BMI) 40.0 and over, adult: Secondary | ICD-10-CM | POA: Insufficient documentation

## 2020-12-23 DIAGNOSIS — M47816 Spondylosis without myelopathy or radiculopathy, lumbar region: Secondary | ICD-10-CM

## 2020-12-23 DIAGNOSIS — M5137 Other intervertebral disc degeneration, lumbosacral region: Secondary | ICD-10-CM

## 2020-12-23 DIAGNOSIS — M461 Sacroiliitis, not elsewhere classified: Secondary | ICD-10-CM | POA: Insufficient documentation

## 2020-12-23 DIAGNOSIS — M47818 Spondylosis without myelopathy or radiculopathy, sacral and sacrococcygeal region: Secondary | ICD-10-CM | POA: Insufficient documentation

## 2020-12-23 MED ORDER — ROPIVACAINE HCL 2 MG/ML IJ SOLN
18.0000 mL | Freq: Once | INTRAMUSCULAR | Status: AC
Start: 2020-12-23 — End: 2020-12-23
  Administered 2020-12-23: 18 mL via PERINEURAL

## 2020-12-23 MED ORDER — ROPIVACAINE HCL 2 MG/ML IJ SOLN
INTRAMUSCULAR | Status: AC
  Filled 2020-12-23: qty 40

## 2020-12-23 MED ORDER — FENTANYL CITRATE (PF) 100 MCG/2ML IJ SOLN
INTRAMUSCULAR | Status: AC
  Filled 2020-12-23: qty 2

## 2020-12-23 MED ORDER — LIDOCAINE HCL 2 % IJ SOLN
20.0000 mL | Freq: Once | INTRAMUSCULAR | Status: AC
  Administered 2020-12-23: 400 mg

## 2020-12-23 MED ORDER — LACTATED RINGERS IV SOLN
1000.0000 mL | Freq: Once | INTRAVENOUS | Status: AC
  Administered 2020-12-23: 1000 mL via INTRAVENOUS

## 2020-12-23 MED ORDER — MIDAZOLAM HCL 5 MG/5ML IJ SOLN
1.0000 mg | INTRAMUSCULAR | Status: DC | PRN
  Administered 2020-12-23: 2 mg via INTRAVENOUS

## 2020-12-23 MED ORDER — LIDOCAINE HCL 2 % IJ SOLN
INTRAMUSCULAR | Status: AC
  Filled 2020-12-23: qty 20

## 2020-12-23 MED ORDER — FENTANYL CITRATE (PF) 100 MCG/2ML IJ SOLN
25.0000 ug | INTRAMUSCULAR | Status: DC | PRN
Start: 2020-12-23 — End: 2020-12-23
  Administered 2020-12-23: 50 ug via INTRAVENOUS

## 2020-12-23 MED ORDER — MIDAZOLAM HCL 5 MG/5ML IJ SOLN
INTRAMUSCULAR | Status: AC
  Filled 2020-12-23: qty 5

## 2020-12-23 MED ORDER — TRIAMCINOLONE ACETONIDE 40 MG/ML IJ SUSP
80.0000 mg | Freq: Once | INTRAMUSCULAR | Status: AC
  Administered 2020-12-23: 80 mg

## 2020-12-23 MED ORDER — TRIAMCINOLONE ACETONIDE 40 MG/ML IJ SUSP
INTRAMUSCULAR | Status: AC
  Filled 2020-12-23: qty 2

## 2020-12-23 NOTE — Progress Notes (Signed)
PROVIDER NOTE: Information contained herein reflects review and annotations entered in association with encounter. Interpretation of such information and data should be left to medically-trained personnel. Information provided to patient can be located elsewhere in the medical record under "Patient Instructions". Document created using STT-dictation technology, any transcriptional errors that may result from process are unintentional.    Patient: Wanda [name]  Service Category: Procedure  Provider: Gaspar Cola, MD  DOB: 31-May-1946  DOS: 12/23/2020  [facility]  MRN: [id number]  Setting: Ambulatory - outpatient  Referring Provider: Colon Branch, MD  Type: Established Patient  Specialty: Interventional Pain Management  PCP: Colon Branch, MD   Primary Reason for Visit: Interventional Pain Management Treatment. CC: Back Pain (Lumbar bilateral ), Knee Pain (Left ), and Hand Pain (Left )  Procedure:          Anesthesia, Analgesia, Anxiolysis:  Type: Lumbar Facet, Medial Branch Block(s) R5L3  Primary Purpose: Therapeutic/palliative Region: Posterolateral Lumbosacral Spine Level: L2, L3, L4, L5, & S1 Medial Branch Level(s). Injecting these levels blocks the L3-4, L4-5, and L5-S1 lumbar facet joints. Laterality: Bilateral  Type: Moderate (Conscious) Sedation combined with Local Anesthesia Indication(s): Analgesia and Anxiety Route: Intravenous (IV) IV Access: Secured Sedation: Meaningful verbal contact was maintained at all times during the procedure  Local Anesthetic: Lidocaine 1-2%  Position: Prone   Indications: 1. Lumbar facet syndrome (Bilateral) (R>L)   2. Spondylosis without myelopathy or radiculopathy, lumbosacral region   3. DDD (degenerative disc disease), lumbosacral   4. Chronic low back pain (Bilateral) (R>L) w/o sciatica   5. DDD (degenerative disc disease), thoracolumbar   6. Lumbosacral facet arthropathy (Multilevel) (Bilateral)   7. Grade 1  Anterolisthesis of lumbosacral spine (4 mm) (L5/S1)   8. Grade 1  Anterolisthesis of lumbar spine (2-3 mm) (L4/L5)   9. Lumbosacral facet hypertrophy (Multilevel) (Bilateral)   10. Morbid obesity with body mass index (BMI) of 40.0 to 44.9 in adult [facility])    Pain Score: Pre-procedure: 5 /10 Post-procedure: 0-No pain/10   Pre-op H&P Assessment:  Ms. Peckenpaugh is a 75 y.o. (year old), female patient, seen today for interventional treatment. She  has a past surgical history that includes G3 P3 . Ms. Criscuolo has a current medication list which includes the following prescription(s): aspirin ec, atorvastatin, calcium carbonate, vitamin d, cyclobenzaprine, gabapentin, hydrochlorothiazide, hydrocodone-acetaminophen, [START ON 12/31/2020] hydrocodone-acetaminophen, [START ON 01/30/2021] hydrocodone-acetaminophen, lisinopril, sertraline, and vitamin d (ergocalciferol), and the following Facility-Administered Medications: fentanyl and midazolam. Her primarily concern today is the Back Pain (Lumbar bilateral ), Knee Pain (Left ), and Hand Pain (Left )  Initial Vital Signs:  Pulse/HCG Rate: 68ECG Heart Rate: 73 Temp: (!) 97.1 F (36.2 C) Resp: 15 BP: 132/81 SpO2: 99 %  BMI: Estimated body mass index is 39.48 kg/m as calculated from the following:   Height as of this encounter: 5\' 4"  (1.626 m).   Weight as of this encounter: 230 lb (104.3 kg).  Risk Assessment: Allergies: Reviewed. She is allergic to simvastatin.  Allergy Precautions: None required Coagulopathies: Reviewed. None identified.  Blood-thinner therapy: None at this time Active Infection(s): Reviewed. None identified. Ms. Hoskinson is afebrile  Site Confirmation: Ms. Renaud was asked to confirm the procedure and laterality before marking the site Procedure checklist: Completed Consent: Before the procedure and under the influence of no sedative(s), amnesic(s), or anxiolytics, the patient was informed of the treatment options, risks and  possible complications. To fulfill our ethical and legal obligations, as recommended by the  American Medical Association's Code of Ethics, I have informed the patient of my clinical impression; the nature and purpose of the treatment or procedure; the risks, benefits, and possible complications of the intervention; the alternatives, including doing nothing; the risk(s) and benefit(s) of the alternative treatment(s) or procedure(s); and the risk(s) and benefit(s) of doing nothing. The patient was provided information about the general risks and possible complications associated with the procedure. These may include, but are not limited to: failure to achieve desired goals, infection, bleeding, organ or nerve damage, allergic reactions, paralysis, and death. In addition, the patient was informed of those risks and complications associated to Spine-related procedures, such as failure to decrease pain; infection (i.e.: Meningitis, epidural or intraspinal abscess); bleeding (i.e.: epidural hematoma, subarachnoid hemorrhage, or any other type of intraspinal or peri-dural bleeding); organ or nerve damage (i.e.: Any type of peripheral nerve, nerve root, or spinal cord injury) with subsequent damage to sensory, motor, and/or autonomic systems, resulting in permanent pain, numbness, and/or weakness of one or several areas of the body; allergic reactions; (i.e.: anaphylactic reaction); and/or death. Furthermore, the patient was informed of those risks and complications associated with the medications. These include, but are not limited to: allergic reactions (i.e.: anaphylactic or anaphylactoid reaction(s)); adrenal axis suppression; blood sugar elevation that in diabetics may result in ketoacidosis or comma; water retention that in patients with history of congestive heart failure may result in shortness of breath, pulmonary edema, and decompensation with resultant heart failure; weight gain; swelling or edema;  medication-induced neural toxicity; particulate matter embolism and blood vessel occlusion with resultant organ, and/or nervous system infarction; and/or aseptic necrosis of one or more joints. Finally, the patient was informed that Medicine is not an exact science; therefore, there is also the possibility of unforeseen or unpredictable risks and/or possible complications that may result in a catastrophic outcome. The patient indicated having understood very clearly. We have given the patient no guarantees and we have made no promises. Enough time was given to the patient to ask questions, all of which were answered to the patient's satisfaction. Ms. Carbon has indicated that she wanted to continue with the procedure. Attestation: I, the ordering provider, attest that I have discussed with the patient the benefits, risks, side-effects, alternatives, likelihood of achieving goals, and potential problems during recovery for the procedure that I have provided informed consent. Date  Time: 12/23/2020  9:16 AM  Pre-Procedure Preparation:  Monitoring: As per clinic protocol. Respiration, ETCO2, SpO2, BP, heart rate and rhythm monitor placed and checked for adequate function Safety Precautions: Patient was assessed for positional comfort and pressure points before starting the procedure. Time-out: I initiated and conducted the "Time-out" before starting the procedure, as per protocol. The patient was asked to participate by confirming the accuracy of the "Time Out" information. Verification of the correct person, site, and procedure were performed and confirmed by me, the nursing staff, and the patient. "Time-out" conducted as per Joint Commission's Universal Protocol (UP.01.01.01). Time: [phone]  Description of Procedure:          Laterality: Bilateral. The procedure was performed in identical fashion on both sides. Levels:  L2, L3, L4, L5, & S1 Medial Branch Level(s) Area Prepped: Posterior Lumbosacral  Region DuraPrep (Iodine Povacrylex [0.7% available iodine] and Isopropyl Alcohol, 74% w/w) Safety Precautions: Aspiration looking for blood return was conducted prior to all injections. At no point did we inject any substances, as a needle was being advanced. Before injecting, the patient was told to immediately notify  me if she was experiencing any new onset of "ringing in the ears, or metallic taste in the mouth". No attempts were made at seeking any paresthesias. Safe injection practices and needle disposal techniques used. Medications properly checked for expiration dates. SDV (single dose vial) medications used. After the completion of the procedure, all disposable equipment used was discarded in the proper designated medical waste containers. Local Anesthesia: Protocol guidelines were followed. The patient was positioned over the fluoroscopy table. The area was prepped in the usual manner. The time-out was completed. The target area was identified using fluoroscopy. A 12-in long, straight, sterile hemostat was used with fluoroscopic guidance to locate the targets for each level blocked. Once located, the skin was marked with an approved surgical skin marker. Once all sites were marked, the skin (epidermis, dermis, and hypodermis), as well as deeper tissues (fat, connective tissue and muscle) were infiltrated with a small amount of a short-acting local anesthetic, loaded on a 10cc syringe with a 25G, 1.5-in  Needle. An appropriate amount of time was allowed for local anesthetics to take effect before proceeding to the next step. Local Anesthetic: Lidocaine 2.0% The unused portion of the local anesthetic was discarded in the proper designated containers. Technical explanation of process:  L2 Medial Branch Nerve Block (MBB): The target area for the L2 medial branch is at the junction of the postero-lateral aspect of the superior articular process and the superior, posterior, and medial edge of the  transverse process of L3. Under fluoroscopic guidance, a Quincke needle was inserted until contact was made with os over the superior postero-lateral aspect of the pedicular shadow (target area). After negative aspiration for blood, 0.5 mL of the nerve block solution was injected without difficulty or complication. The needle was removed intact. L3 Medial Branch Nerve Block (MBB): The target area for the L3 medial branch is at the junction of the postero-lateral aspect of the superior articular process and the superior, posterior, and medial edge of the transverse process of L4. Under fluoroscopic guidance, a Quincke needle was inserted until contact was made with os over the superior postero-lateral aspect of the pedicular shadow (target area). After negative aspiration for blood, 0.5 mL of the nerve block solution was injected without difficulty or complication. The needle was removed intact. L4 Medial Branch Nerve Block (MBB): The target area for the L4 medial branch is at the junction of the postero-lateral aspect of the superior articular process and the superior, posterior, and medial edge of the transverse process of L5. Under fluoroscopic guidance, a Quincke needle was inserted until contact was made with os over the superior postero-lateral aspect of the pedicular shadow (target area). After negative aspiration for blood, 0.5 mL of the nerve block solution was injected without difficulty or complication. The needle was removed intact. L5 Medial Branch Nerve Block (MBB): The target area for the L5 medial branch is at the junction of the postero-lateral aspect of the superior articular process and the superior, posterior, and medial edge of the sacral ala. Under fluoroscopic guidance, a Quincke needle was inserted until contact was made with os over the superior postero-lateral aspect of the pedicular shadow (target area). After negative aspiration for blood, 0.5 mL of the nerve block solution was injected  without difficulty or complication. The needle was removed intact. S1 Medial Branch Nerve Block (MBB): The target area for the S1 medial branch is at the posterior and inferior 6 o'[name] position of the L5-S1 facet joint. Under fluoroscopic guidance, the Quincke  needle inserted for the L5 MBB was redirected until contact was made with os over the inferior and postero aspect of the sacrum, at the 6 o' [name] position under the L5-S1 facet joint (Target area). After negative aspiration for blood, 0.5 mL of the nerve block solution was injected without difficulty or complication. The needle was removed intact.  Nerve block solution: 0.2% PF-Ropivacaine + Triamcinolone (40 mg/mL) diluted to a final concentration of 4 mg of Triamcinolone/mL of Ropivacaine The unused portion of the solution was discarded in the proper designated containers. Procedural Needles: 22-gauge, 3.5-inch, Quincke needles used for all levels.  Once the entire procedure was completed, the treated area was cleaned, making sure to leave some of the prepping solution back to take advantage of its long term bactericidal properties.      Illustration of the posterior view of the lumbar spine and the posterior neural structures. Laminae of L2 through S1 are labeled. DPRL5, dorsal primary ramus of L5; DPRS1, dorsal primary ramus of S1; DPR3, dorsal primary ramus of L3; FJ, facet (zygapophyseal) joint L3-L4; I, inferior articular process of L4; LB1, lateral branch of dorsal primary ramus of L1; IAB, inferior articular branches from L3 medial branch (supplies L4-L5 facet joint); IBP, intermediate branch plexus; MB3, medial branch of dorsal primary ramus of L3; NR3, third lumbar nerve root; S, superior articular process of L5; SAB, superior articular branches from L4 (supplies L4-5 facet joint also); TP3, transverse process of L3.  Vitals:   12/23/20 0950 12/23/20 1001 12/23/20 1011 12/23/20 1020  BP: 121/77 99/68 111/78 102/74  Pulse: 72      Resp: 14 15 20 20   Temp:      TempSrc:      SpO2: 98% 100% 100% 100%  Weight:      Height:         Start Time: 0942 hrs. End Time: 0950 hrs.  Imaging Guidance (Spinal):          Type of Imaging Technique: Fluoroscopy Guidance (Spinal) Indication(s): Assistance in needle guidance and placement for procedures requiring needle placement in or near specific anatomical locations not easily accessible without such assistance. Exposure Time: Please see nurses notes. Contrast: None used. Fluoroscopic Guidance: I was personally present during the use of fluoroscopy. "Tunnel Vision Technique" used to obtain the best possible view of the target area. Parallax error corrected before commencing the procedure. "Direction-depth-direction" technique used to introduce the needle under continuous pulsed fluoroscopy. Once target was reached, antero-posterior, oblique, and lateral fluoroscopic projection used confirm needle placement in all planes. Images permanently stored in EMR. Interpretation: No contrast injected. I personally interpreted the imaging intraoperatively. Adequate needle placement confirmed in multiple planes. Permanent images saved into the patient's record.  Antibiotic Prophylaxis:   Anti-infectives (From admission, onward)   None     Indication(s): None identified  Post-operative Assessment:  Post-procedure Vital Signs:  Pulse/HCG Rate: 72 (nsr)67 Temp: (!) 97.1 F (36.2 C) Resp: 20 BP: 102/74 SpO2: 100 %  EBL: None  Complications: No immediate post-treatment complications observed by team, or reported by patient.  Note: The patient tolerated the entire procedure well. A repeat set of vitals were taken after the procedure and the patient was kept under observation following institutional policy, for this type of procedure. Post-procedural neurological assessment was performed, showing return to baseline, prior to discharge. The patient was provided with post-procedure  discharge instructions, including a section on how to identify potential problems. Should any problems arise concerning this procedure, the patient was  given instructions to immediately contact us, at any time, without hesitation. In any case, we plan to contact the patient by telephone for a follow-up status report regarding this interventional procedure.  Comments:  No additional relevant information.  Plan of Care  Orders:  Orders Placed This Encounter  Procedures  . LUMBAR FACET(MEDIAL BRANCH NERVE BLOCK) MBNB    Scheduling Instructions:     Procedure: Lumbar facet block (AKA.: Lumbosacral medial branch nerve block)     Side: Bilateral     Level: L3-4, L4-5, & L5-S1 Facets (L2, L3, L4, L5, & S1 Medial Branch Nerves)     Sedation: Patient's choice.     Timeframe: Today    Order Specific Question:   Where will this procedure be performed?    Answer:   [facility]  . DG [facility] C-ARM 1-60 MIN NO REPORT    Intraoperative interpretation by procedural physician at [place].    Standing Status:   Standing    Number of Occurrences:   1    Order Specific Question:   Reason for exam:    Answer:   Assistance in needle guidance and placement for procedures requiring needle placement in or near specific anatomical locations not easily accessible without such assistance.  . Informed Consent Details: Physician/Practitioner Attestation; Transcribe to consent form and obtain patient signature    Nursing Order: Transcribe to consent form and obtain patient signature. Note: Always confirm laterality of pain with Ms. Santizo, before procedure.    Order Specific Question:   Physician/Practitioner attestation of informed consent for procedure/surgical case    Answer:   I, the physician/practitioner, attest that I have discussed with the patient the benefits, risks, side effects, alternatives, likelihood of achieving goals and potential problems during recovery for the procedure  that I have provided informed consent.    Order Specific Question:   Procedure    Answer:   Lumbar Facet Block  under fluoroscopic guidance    Order Specific Question:   Physician/Practitioner performing the procedure    Answer:   Javanna Patin A. Dossie Arbour MD    Order Specific Question:   Indication/Reason    Answer:   Low Back Pain, with our without leg pain, due to Facet Joint Arthralgia (Joint Pain) Spondylosis (Arthritis of the Spine), without myelopathy or radiculopathy (Nerve Damage).  . Provide equipment / supplies at bedside    "Block Tray" (Disposable  single use) Needle type: SpinalSpinal Amount/quantity: 4 Size: Long (7-inch) Gauge: 22G    Standing Status:   Standing    Number of Occurrences:   1    Order Specific Question:   Specify    Answer:   Block Tray   Chronic Opioid Analgesic:  Hydrocodone/APAP 5/325, 1 tab PO q 8 hrs (15 mg/day of hydrocodone) MME/day:15 mg/day.   Medications ordered for procedure: Meds ordered this encounter  Medications  . lidocaine (XYLOCAINE) 2 % (with pres) injection 400 mg  . lactated ringers infusion 1,000 mL  . midazolam (VERSED) 5 MG/5ML injection 1-2 mg    Make sure Flumazenil is available in the pyxis when using this medication. If oversedation occurs, administer 0.2 mg IV over 15 sec. If after 45 sec no response, administer 0.2 mg again over 1 min; may repeat at 1 min intervals; not to exceed 4 doses (1 mg)  . fentaNYL (SUBLIMAZE) injection 25-50 mcg    Make sure Narcan is available in the pyxis when using this medication. In the event of respiratory depression (RR<  8/min): Titrate NARCAN (naloxone) in increments of 0.1 to 0.2 mg IV at 2-3 minute intervals, until desired degree of reversal.  . ropivacaine (PF) 2 mg/mL (0.2%) (NAROPIN) injection 18 mL  . triamcinolone acetonide (KENALOG-40) injection 80 mg   Medications administered: We administered lidocaine, lactated ringers, midazolam, fentaNYL, ropivacaine (PF) 2 mg/mL (0.2%), and  triamcinolone acetonide.  See the medical record for exact dosing, route, and time of administration.  Follow-up plan:   Return in about 2 weeks (around 01/06/2021) for procedure day (afternoon VV) (PPE).       Interventional Therapies  Risk  Complexity Considerations:   NOTE: NO LUMBAR RFA until BMI <35.   Planned  Pending:   Therapeutic/palliative bilateral lumbar facet MBB #R5L3  IM Toradol/Norflex 60/60 mg (12/01/2020)    Under consideration:   Diagnostic left genicular NB #1    Completed:   Therapeutic right lumbar facet MBB x4 (04/01/2020)  Therapeutic left lumbar facet MBB x2 (11/18/2019)  Therapeutic right IA steroid knee injection x1 (08/15/2018)  Therapeutic left IA steroid knee injection x2 (08/15/2018)  Therapeutic left IA Hyalgan knee injection x9 (10/07/2020)    Therapeutic  Palliative (PRN) options:   Palliative left lumbar facet MBB #3  Palliative right lumbar facet MBB #5  Palliative left IA Hyalgan knee injection #10  Palliative left vs bilateral IA knee injection #3 (w/ steroid)      Recent Visits Date Type Provider Dept  12/01/20 Office Visit Milinda Pointer, MD [facility]  10/07/20 Procedure visit Milinda Pointer, MD [facility]  Showing recent visits within past 90 days and meeting all other requirements Today's Visits Date Type Provider Dept  12/23/20 Procedure visit Milinda Pointer, MD [facility]  Showing today's visits and meeting all other requirements Future Appointments Date Type Provider Dept  01/06/21 Appointment Milinda Pointer, MD [facility]  Showing future appointments within next 90 days and meeting all other requirements  Disposition: Discharge home  Discharge (Date  Time): 12/23/2020; 1020 hrs.   Primary Care Physician: Colon Branch, MD Location: [facility] Note by: Gaspar Cola, MD Date: 12/23/2020; Time: 12:47 PM  Disclaimer:  Medicine is not  an Chief Strategy Officer. The only guarantee in medicine is that nothing is guaranteed. It is important to note that the decision to proceed with this intervention was based on the information collected from the patient. The Data and conclusions were drawn from the patient's questionnaire, the interview, and the physical examination. Because the information was provided in large part by the patient, it cannot be guaranteed that it has not been purposely or unconsciously manipulated. Every effort has been made to obtain as much relevant data as possible for this evaluation. It is important to note that the conclusions that lead to this procedure are derived in large part from the available data. Always take into account that the treatment will also be dependent on availability of resources and existing treatment guidelines, considered by other Pain Management Practitioners as being common knowledge and practice, at the time of the intervention. For Medico-Legal purposes, it is also important to point out that variation in procedural techniques and pharmacological choices are the acceptable norm. The indications, contraindications, technique, and results of the above procedure should only be interpreted and judged by a Board-Certified Interventional Pain Specialist with extensive familiarity and expertise in the same exact procedure and technique.

## 2020-12-23 NOTE — Patient Instructions (Signed)
______________________________________________________________________________________________  Body mass index (BMI)  Body mass index (BMI) is a common tool for deciding whether a person has an appropriate body weight.  It measures a persons weight in relation to their height.   According to the [employer] of health (NIH): [name] A BMI of less than 18.5 means that a person is underweight. . A BMI of between 18.5 and 24.9 is ideal. . A BMI of between 25 and 29.9 is overweight. . A BMI over 30 indicates obesity.  Weight Management Required  URGENT: Your weight has been found to be adversely affecting your health.  Dear Ms. Domke:  Your current Estimated body mass index is 40.74 kg/m as calculated from the following:   Height as of 12/01/20: $RemoveBef'5\' 3"'[id number]$  (1.6 m).   Weight as of 12/01/20: 230 lb (104.3 kg).  Please use the table below to identify your weight category and associated incidence of chronic pain, secondary to your weight.  Body Mass Index (BMI) Classification BMI level (kg/m2) Category Associated incidence of chronic pain  <18  Underweight   18.5-24.9 Ideal body weight   25-29.9 Overweight  20%  30-34.9 Obese (Class I)  68%  35-39.9 Severe obesity (Class II)  136%  >40 Extreme obesity (Class III)  254%   In addition: You will be considered "Morbidly Obese", if your BMI is above 30 and you have one or more of the following conditions which are known to be caused and/or directly associated with obesity: 1.    Type 2 Diabetes (Which in turn can lead to cardiovascular diseases (CVD), stroke, peripheral vascular diseases (PVD), retinopathy, nephropathy, and neuropathy) 2.    Cardiovascular Disease (High Blood Pressure; Congestive Heart Failure; High Cholesterol; Coronary Artery Disease; Angina; or History of Heart Attacks) 3.    Breathing problems (Asthma; obesity-hypoventilation syndrome; obstructive sleep apnea; chronic inflammatory airway disease; reactive airway disease; or  shortness of breath) 4.    Chronic kidney disease 5.    Liver disease (nonalcoholic fatty liver disease) 6.    High blood pressure 7.    Acid reflux (gastroesophageal reflux disease; heartburn) 8.    Osteoarthritis (OA) (with any of the following: hip pain; knee pain; and/or low back pain) 9.    Low back pain (Lumbar Facet Syndrome; and/or Degenerative Disc Disease) 10.  Hip pain (Osteoarthritis of hip) (For every 1 lbs of added body weight, there is a 2 lbs increase in pressure inside of each hip articulation. 1:2 mechanical relationship) 11.  Knee pain (Osteoarthritis of knee) (For every 1 lbs of added body weight, there is a 4 lbs increase in pressure inside of each knee articulation. 1:4 mechanical relationship) (patients with a BMI>30 kg/m2 were 6.8 times more likely to develop knee OA than normal-weight individuals) 12.  Cancer: Epidemiological studies have shown that obesity is a risk factor for: post-menopausal breast cancer; cancers of the endometrium, colon and kidney cancer; malignant adenomas of the oesophagus. Obese subjects have an approximately 1.5-3.5-fold increased risk of developing these cancers compared with normal-weight subjects, and it has been estimated that between 15 and 45% of these cancers can be attributed to overweight. More recent studies suggest that obesity may also increase the risk of other types of cancer, including pancreatic, hepatic and gallbladder cancer. (Ref: Obesity and cancer. Pischon T, Nthlings U, [employer] H. Proc Nutr Soc. 2008 May;67(2):128-45. doi: 79.8921/J9417408144818563.) The International Agency for Research on Cancer (IARC) has identified 13 cancers associated with overweight and obesity: meningioma, multiple myeloma, adenocarcinoma of the esophagus, and  cancers of the thyroid, postmenopausal breast cancer, gallbladder, stomach, liver, pancreas, kidney, ovaries, uterus, colon and rectal (colorectal) cancers. 41 percent of all cancers diagnosed in women  and 24 percent of those diagnosed in men are associated with overweight and obesity.  Recommendation: At this point it is urgent that you take a step back and concentrate in loosing weight. Dedicate 100% of your efforts on this task. Nothing else will improve your health more than bringing your weight down and your BMI to less than 30. If you are here, you probably have chronic pain. We know that most chronic pain patients have difficulty exercising secondary to their pain. For this reason, you must rely on proper nutrition and diet in order to lose the weight. If your BMI is above 40, you should seriously consider bariatric surgery. A realistic goal is to lose 10% of your body weight over a period of 12 months.  Be honest to yourself, if over time you have unsuccessfully tried to lose weight, then it is time for you to seek professional help and to enter a medically supervised weight management program, and/or undergo bariatric surgery. Stop procrastinating.   Pain management considerations:  1.    Pharmacological Problems: Be advised that the use of opioid analgesics (oxycodone; hydrocodone; morphine; methadone; codeine; and all of their derivatives) have been associated with decreased metabolism and weight gain.  For this reason, should we see that you are unable to lose weight while taking these medications, it may become necessary for [place] to taper down and indefinitely discontinue them.  2.    Technical Problems: The incidence of successful interventional therapies decreases as the patient's BMI increases. It is much more difficult to accomplish a safe and effective interventional therapy on a patient with a BMI above 35. 3.    Radiation Exposure Problems: The x-rays machine, used to accomplish injection therapies, will automatically increase their x-ray output in order to capture an appropriate bone image. This means that radiation exposure increases exponentially with the patient's BMI. (The higher the  BMI, the higher the radiation exposure.) Although the level of radiation used at a given time is still safe to the patient, it is not for the physician and/or assisting staff. Unfortunately, radiation exposure is accumulative. Because physicians and the staff have to do procedures and be exposed on a daily basis, this can result in health problems such as cancer and radiation burns. Radiation exposure to the staff is monitored by the radiation batches that they wear. The exposure levels are reported back to the staff on a quarterly basis. Depending on levels of exposure, physicians and staff may be obligated by law to decrease this exposure. This means that they have the right and obligation to refuse providing therapies where they may be overexposed to radiation. For this reason, physicians may decline to offer therapies such as radiofrequency ablation or implants to patients with a BMI above 40. 4.    Current Trends: Be advised that the current trend is to no longer offer certain therapies to patients with a BMI equal to, or above 35, due to increase perioperative risks, increased technical procedural difficulties, and excessive radiation exposure to healthcare personnel.  ______________________________________________________________________________________________    ____________________________________________________________________________________________  Post-Procedure Discharge Instructions  Instructions:  Apply ice:   Purpose: This will minimize any swelling and discomfort after procedure.   When: Day of procedure, as soon as you get home.  How: Fill a plastic sandwich bag with crushed ice. Cover it with a small  towel and apply to injection site.  How long: (15 min on, 15 min off) Apply for 15 minutes then remove x 15 minutes.  Repeat sequence on day of procedure, until you go to bed.  Apply heat:   Purpose: To treat any soreness and discomfort from the procedure.  When:  Starting the next day after the procedure.  How: Apply heat to procedure site starting the day following the procedure.  How long: May continue to repeat daily, until discomfort goes away.  Food intake: Start with clear liquids (like water) and advance to regular food, as tolerated.   Physical activities: Keep activities to a minimum for the first 8 hours after the procedure. After that, then as tolerated.  Driving: If you have received any sedation, be responsible and do not drive. You are not allowed to drive for 24 hours after having sedation.  Blood thinner: (Applies only to those taking blood thinners) You may restart your blood thinner 6 hours after your procedure.  Insulin: (Applies only to Diabetic patients taking insulin) As soon as you can eat, you may resume your normal dosing schedule.  Infection prevention: Keep procedure site clean and dry. Shower daily and clean area with soap and water.  Post-procedure Pain Diary: Extremely important that this be done correctly and accurately. Recorded information will be used to determine the next step in treatment. For the purpose of accuracy, follow these rules:  Evaluate only the area treated. Do not report or include pain from an untreated area. For the purpose of this evaluation, ignore all other areas of pain, except for the treated area.  After your procedure, avoid taking a long nap and attempting to complete the pain diary after you wake up. Instead, set your alarm clock to go off every hour, on the hour, for the initial 8 hours after the procedure. Document the duration of the numbing medicine, and the relief you are getting from it.  Do not go to sleep and attempt to complete it later. It will not be accurate. If you received sedation, it is likely that you were given a medication that may cause amnesia. Because of this, completing the diary at a later time may cause the information to be inaccurate. This information is needed to  plan your care.  Follow-up appointment: Keep your post-procedure follow-up evaluation appointment after the procedure (usually 2 weeks for most procedures, 6 weeks for radiofrequencies). DO NOT FORGET to bring you pain diary with you.   Expect: (What should I expect to see with my procedure?)  From numbing medicine (AKA: Local Anesthetics): Numbness or decrease in pain. You may also experience some weakness, which if present, could last for the duration of the local anesthetic.  Onset: Full effect within 15 minutes of injected.  Duration: It will depend on the type of local anesthetic used. On the average, 1 to 8 hours.   From steroids (Applies only if steroids were used): Decrease in swelling or inflammation. Once inflammation is improved, relief of the pain will follow.  Onset of benefits: Depends on the amount of swelling present. The more swelling, the longer it will take for the benefits to be seen. In some cases, up to 10 days.  Duration: Steroids will stay in the system x 2 weeks. Duration of benefits will depend on multiple posibilities including persistent irritating factors.  Side-effects: If present, they may typically last 2 weeks (the duration of the steroids).  Frequent: Cramps (if they occur, drink Gatorade and take  over-the-counter Magnesium 450-500 mg once to twice a day); water retention with temporary weight gain; increases in blood sugar; decreased immune system response; increased appetite.  Occasional: Facial flushing (red, warm cheeks); mood swings; menstrual changes.  Uncommon: Long-term decrease or suppression of natural hormones; bone thinning. (These are more common with higher doses or more frequent use. This is why we prefer that our patients avoid having any injection therapies in other practices.)   Very Rare: Severe mood changes; psychosis; aseptic necrosis.  From procedure: Some discomfort is to be expected once the numbing medicine wears off. This should  be minimal if ice and heat are applied as instructed.  Call if: (When should I call?)  You experience numbness and weakness that gets worse with time, as opposed to wearing off.  New onset bowel or bladder incontinence. (Applies only to procedures done in the spine)  Emergency Numbers:  Durning business hours (Monday - Thursday, 8:00 AM - 4:00 PM) (Friday, 9:00 AM - 12:00 Noon): [phone]  After hours: [phone]  NOTE: If you are having a problem and are unable connect with, or to talk to a provider, then go to your nearest urgent care or emergency department. If the problem is serious and urgent, please call 911. ____________________________________________________________________________________________

## 2020-12-24 ENCOUNTER — Telehealth: Payer: Self-pay

## 2020-12-24 NOTE — Telephone Encounter (Signed)
Post procedure phone call.  LM 

## 2021-01-05 ENCOUNTER — Telehealth: Payer: Self-pay

## 2021-01-05 ENCOUNTER — Encounter: Payer: Self-pay | Admitting: Pain Medicine

## 2021-01-06 ENCOUNTER — Ambulatory Visit: Payer: Medicare HMO | Attending: Pain Medicine | Admitting: Pain Medicine

## 2021-01-06 ENCOUNTER — Other Ambulatory Visit: Payer: Self-pay

## 2021-01-06 DIAGNOSIS — Z6841 Body Mass Index (BMI) 40.0 and over, adult: Secondary | ICD-10-CM

## 2021-01-06 DIAGNOSIS — G894 Chronic pain syndrome: Secondary | ICD-10-CM | POA: Diagnosis not present

## 2021-01-06 DIAGNOSIS — M4316 Spondylolisthesis, lumbar region: Secondary | ICD-10-CM | POA: Diagnosis not present

## 2021-01-06 DIAGNOSIS — M5135 Other intervertebral disc degeneration, thoracolumbar region: Secondary | ICD-10-CM | POA: Diagnosis not present

## 2021-01-06 DIAGNOSIS — M4317 Spondylolisthesis, lumbosacral region: Secondary | ICD-10-CM

## 2021-01-06 DIAGNOSIS — M47816 Spondylosis without myelopathy or radiculopathy, lumbar region: Secondary | ICD-10-CM | POA: Diagnosis not present

## 2021-01-06 DIAGNOSIS — G8929 Other chronic pain: Secondary | ICD-10-CM | POA: Diagnosis not present

## 2021-01-06 DIAGNOSIS — M545 Low back pain, unspecified: Secondary | ICD-10-CM

## 2021-01-06 NOTE — Progress Notes (Signed)
Patient: Wanda [name]  Service Category: E/M  Provider: Gaspar Cola, MD  DOB: May 28, 1946  DOS: 01/06/2021  Location: Office  MRN: [id number]  Setting: Ambulatory outpatient  Referring Provider: Colon Branch, MD  Type: Established Patient  Specialty: Interventional Pain Management  PCP: Colon Branch, MD  Location: Remote location  Delivery: TeleHealth     Virtual Encounter - Pain Management PROVIDER NOTE: Information contained herein reflects review and annotations entered in association with encounter. Interpretation of such information and data should be left to medically-trained personnel. Information provided to patient can be located elsewhere in the medical record under "Patient Instructions". Document created using STT-dictation technology, any transcriptional errors that may result from process are unintentional.    Contact & Pharmacy Preferred: [phone] Home: [phone] (home) Mobile: [phone] (mobile) E-mail: No e-mail address on record  [id number] [address], [address] [place] [address] Phone: [phone] Fax: [phone]  [facility] [id number] - [address], [address] AT [place] [address] Phone: [phone] Fax: [phone]   Pre-screening  Wanda [name] offered "in-person" vs "virtual" encounter. She indicated preferring virtual for this encounter.   Reason COVID-19*  Social distancing based on CDC and AMA recommendations.   I contacted Wanda [name] on 01/06/2021 via telephone.      I clearly identified myself as Gaspar Cola, MD. I verified that I was speaking with the correct person using two identifiers (Name: CALANDRIA MULLINGS, and date of birth: [birth year]).  Consent I sought verbal advanced consent from Wanda [name] for virtual visit interactions. I informed Wanda [name] of possible security and privacy  concerns, risks, and limitations associated with providing "not-in-person" medical evaluation and management services. I also informed Wanda [name] of the availability of "in-person" appointments. Finally, I informed her that there would be a charge for the virtual visit and that she could be  personally, fully or partially, financially responsible for it. Ms. Pennino expressed understanding and agreed to proceed.   Historic Elements   Wanda [name] is a 75 y.o. year old, female patient evaluated today after our last contact on 12/23/2020. Wanda [name]  has a past medical history of Allergy, Chronic back pain, Depression, DJD (degenerative joint disease), Hyperlipidemia, Hypertension, Menopause, Musculoskeletal pain (12/07/2015), and TMJ PAIN (05/17/2010). She also  has a past surgical history that includes G3 P3 . Wanda [name] has a current medication list which includes the following prescription(s): aspirin ec, atorvastatin, calcium carbonate, vitamin d, cyclobenzaprine, gabapentin, hydrochlorothiazide, hydrocodone-acetaminophen, [START ON 01/30/2021] hydrocodone-acetaminophen, lisinopril, sertraline, vitamin d (ergocalciferol), and hydrocodone-acetaminophen. She  reports that she has never smoked. She has never used smokeless tobacco. She reports that she does not drink alcohol and does not use drugs. Wanda [name] is allergic to simvastatin.   HPI  Today, she is being contacted for a post-procedure assessment.  According to the patient she attained 100% relief of the pain for the duration of the local anesthetic.  Once that local anesthetic wore off then the relief went down to about a 75% improvement.  However, she has clarified for me that her pain went from being constant to now being intermittent and only showing up when she stands for prolonged periods of time.  She also indicated that she is now able to sleep better and turned in the bed better without having the pain as well as bending better, which  she could not  do before because of the pain.  She is actually enjoying periods of complete relief of the pain, which was not the case before the treatment.  She indicates currently doing well with her medications and not needing anything else for now.  RTCB: 03/01/2021 Nonopioids transferred 05/03/2020: Flexeril, Neurontin, and calcium  Post-Procedure Evaluation  Procedure (12/23/2020): Palliative bilateral lumbar facet MBB R5L3 under fluoroscopic guidance and IV sedation Pre-procedure pain level: 5/10 Post-procedure: 0/10 (100% relief)  Sedation: Sedation provided.  Effectiveness during initial hour after procedure(Ultra-Short Term Relief): 100 %.  Local anesthetic used: Long-acting (4-6 hours) Effectiveness: Defined as any analgesic benefit obtained secondary to the administration of local anesthetics. This carries significant diagnostic value as to the etiological location, or anatomical origin, of the pain. Duration of benefit is expected to coincide with the duration of the local anesthetic used.  Effectiveness during initial 4-6 hours after procedure(Short-Term Relief): 100 %.  Long-term benefit: Defined as any relief past the pharmacologic duration of the local anesthetics.  Effectiveness past the initial 6 hours after procedure(Long-Term Relief): 75 %.  Current benefits: Defined as benefit that persist at this time.   Analgesia:   Ongoing 75% relief of the low back pain. Function: Wanda [name] reports improvement in function ROM: Wanda [name] reports improvement in ROM  Pharmacotherapy Assessment  Analgesic: Hydrocodone/APAP 5/325, 1 tab PO q 8 hrs (15 mg/day of hydrocodone) MME/day: 15 mg/day.   Monitoring: [place] PMP: PDMP not reviewed this encounter.       Pharmacotherapy: No side-effects or adverse reactions reported. Compliance: No problems identified. Effectiveness: Clinically acceptable. Plan: Refer to "POC".  UDS:  Summary  Date Value Ref Range Status  02/04/2020 Note   Final    Comment:    ==================================================================== [employer] Select 13 (MW) ==================================================================== Test                             Result       Flag       Units  Drug Present and Declared for Prescription Verification   Hydrocodone                    723          EXPECTED   ng/mg creat   Hydromorphone                  138          EXPECTED   ng/mg creat   Dihydrocodeine                 73           EXPECTED   ng/mg creat   Norhydrocodone                 1435         EXPECTED   ng/mg creat    Sources of hydrocodone include scheduled prescription medications.    Hydromorphone, dihydrocodeine and norhydrocodone are expected    metabolites of hydrocodone. Hydromorphone and dihydrocodeine are    also available as scheduled prescription medications.  ==================================================================== Test                      Result    Flag   Units      Ref Range   Creatinine              133  mg/dL      >=20 ==================================================================== Declared Medications:  The flagging and interpretation on this report are based on the  following declared medications.  Unexpected results may arise from  inaccuracies in the declared medications.   **Note: The testing scope of this panel includes these medications:   Hydrocodone (Norco)   **Note: The testing scope of this panel does not include the  following reported medications:   Acetaminophen (Norco)  Aspirin  Atorvastatin (Lipitor)  Calcium  Cyclobenzaprine (Flexeril)  Gabapentin (Neurontin)  Hydrochlorothiazide (Hydrodiuril)  Lisinopril (Zestril)  Sertraline (Zoloft)  Vitamin D  Vitamin D2 (Drisdol) ==================================================================== For clinical consultation, please call [phone]. ====================================================================     Laboratory Chemistry Profile   Renal Lab Results  Component Value Date   BUN 10 01/16/2020   CREATININE 0.66 46/56/8127   BCR NOT APPLICABLE 51/70/0174   GFR 105.13 12/24/2018   GFRAA 103 02/13/2019   GFRNONAA 89 02/13/2019     Hepatic Lab Results  Component Value Date   AST 19 01/16/2020   ALT 22 01/16/2020   ALBUMIN 4.5 02/13/2019   ALKPHOS 87 02/13/2019   HCVAB NEGATIVE 08/30/2016     Electrolytes Lab Results  Component Value Date   NA 138 01/16/2020   K 4.3 01/16/2020   CL 103 01/16/2020   CALCIUM 9.8 01/16/2020   MG 1.8 02/13/2019     Bone Lab Results  Component Value Date   VD25OH 25 (L) 01/16/2020   [id number] 68 12/24/2017   [id number] 68 12/24/2017   VD2125OH2 <8 12/24/2017   25OHVITD1 22 (L) 02/13/2019   25OHVITD2 3.4 02/13/2019   25OHVITD3 19 02/13/2019     Inflammation (CRP: Acute Phase) (ESR: Chronic Phase) Lab Results  Component Value Date   CRP 8 02/13/2019   ESRSEDRATE 22 01/16/2020       Note: Above Lab results reviewed.  Imaging  DG PAIN CLINIC C-ARM 1-60 MIN NO REPORT Fluoro was used, but no Radiologist interpretation will be provided.  Please refer to "NOTES" tab for provider progress note.  Assessment  The primary encounter diagnosis was Chronic low back pain (Bilateral) (R>L) w/o sciatica. 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  Plan of Care  Problem-specific:  No problem-specific Assessment & Plan notes found for this encounter.  Wanda [name] has a current medication list which includes the following long-term medication(s):  atorvastatin, calcium carbonate, cyclobenzaprine, gabapentin, hydrochlorothiazide, hydrocodone-acetaminophen, [START ON 01/30/2021] hydrocodone-acetaminophen, lisinopril, sertraline, and hydrocodone-acetaminophen.  Pharmacotherapy (Medications Ordered): No orders of the defined types were placed in this encounter.  Orders:  No orders of the defined types were placed in this encounter.  Follow-up plan:   Return in about 8 weeks (around 03/01/2021) for evaluation day (F2F) (MM).     Interventional Therapies  Risk  Complexity Considerations:   NOTE: NO LUMBAR RFA until BMI <35.   Planned  Pending:   Therapeutic/palliative bilateral lumbar facet MBB #R5L3  IM Toradol/Norflex 60/60 mg (12/01/2020)    Under consideration:   Diagnostic left genicular NB #1    Completed:   Therapeutic right lumbar facet MBB x4 (04/01/2020)  Therapeutic left lumbar facet MBB x2 (11/18/2019)  Therapeutic right IA steroid knee injection x1 (08/15/2018)  Therapeutic left  IA steroid knee injection x2 (08/15/2018)  Therapeutic left IA Hyalgan knee injection x9 (10/07/2020)    Therapeutic  Palliative (PRN) options:   Palliative left lumbar facet MBB #3  Palliative right lumbar facet MBB #5  Palliative left IA Hyalgan knee injection #10  Palliative left vs bilateral IA knee injection #3 (w/ steroid)       Recent Visits Date Type Provider Dept  12/23/20 Procedure visit Milinda Pointer, MD [facility]  12/01/20 Office Visit Milinda Pointer, MD [facility]  Showing recent visits within past 90 days and meeting all other requirements Today's Visits Date Type Provider Dept  01/06/21 Telemedicine Milinda Pointer, MD [facility]  Showing today's visits and meeting all other requirements Future Appointments No visits were found meeting these conditions. Showing future appointments within next 90 days and meeting all other requirements I discussed the assessment and treatment  plan with the patient. The patient was provided an opportunity to ask questions and all were answered. The patient agreed with the plan and demonstrated an understanding of the instructions.  Patient advised to call back or seek an in-person evaluation if the symptoms or condition worsens.  Duration of encounter: 15 minutes.  Note by: Gaspar Cola, MD Date: 01/06/2021; Time: 12:40 PM

## 2021-01-07 ENCOUNTER — Other Ambulatory Visit: Payer: Self-pay | Admitting: Internal Medicine

## 2021-02-01 ENCOUNTER — Other Ambulatory Visit: Payer: Self-pay | Admitting: Pain Medicine

## 2021-02-01 DIAGNOSIS — M797 Fibromyalgia: Secondary | ICD-10-CM

## 2021-02-01 DIAGNOSIS — M792 Neuralgia and neuritis, unspecified: Secondary | ICD-10-CM

## 2021-02-20 NOTE — Progress Notes (Signed)
PROVIDER NOTE: Information contained herein reflects review and annotations entered in association with encounter. Interpretation of such information and data should be left to medically-trained personnel. Information provided to patient can be located elsewhere in the medical record under "Patient Instructions". Document created using STT-dictation technology, any transcriptional errors that may result from process are unintentional.    Patient: Wanda [name]  Service Category: E/M  Provider: Gaspar Cola, MD  DOB: 09/08/1945  DOS: 02/21/2021  Specialty: Interventional Pain Management  MRN: [id number]  Setting: Ambulatory outpatient  PCP: Colon Branch, MD  Type: Established Patient    Referring Provider: Colon Branch, MD  Location: Office  Delivery: Face-to-face     HPI  [name], a 75 y.o. year old female, is here today because of her Chronic pain syndrome [G89.4]. Wanda [name] primary complain today is Back Pain Last encounter: My last encounter with her was on 02/01/2021. 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); Chronic sacroiliac joint pain (Right); Chronic radicular pain of lower extremity (Left) (S1 dermatome); Osteoarthritis of hips (Bilateral); Chronic knee pain (1ry area of Pain) (Bilateral) (L>R); Osteoarthritis of knee (Bilateral); Chronic hip pain (Bilateral) (L>R); Chronic low back pain (Bilateral) (L>R) w/ sciatica (Left); Chronic musculoskeletal pain; Abnormal MRI, lumbar spine (03/20/2018); Spondylosis without myelopathy or radiculopathy, lumbosacral region; DDD (degenerative disc disease), lumbosacral; Chronic low back pain (Bilateral) (R>L) w/o  sciatica; Chronic knee pain (Left); Chronic hip pain (Right); Fibromyalgia; Tricompartment osteoarthritis of knee (Left); Osteoarthritis of knee (Left); Arthropathy of knee (Left); Secondary osteoarthritis of knee (Bilateral); Enthesopathy of knee region (Left); Numbness and tingling of upper extremity (Left); Cervical radiculopathy at C8 (Left); Cervical radiculopathy at C7 (Left); DDD (degenerative disc disease), thoracolumbar; Chronic sacroiliac joint arthropathy (Bilateral); Degenerative joint disease of sacroiliac joint (Bilateral) ([place]); Osteoarthritis of sacroiliac joints (Bilateral) ([place]); Somatic dysfunction of sacroiliac joints (Bilateral); Lumbosacral facet arthropathy (Multilevel) (Bilateral); Grade 1 Anterolisthesis of lumbosacral spine (4-5 mm) (L5/S1); Grade 1  Anterolisthesis of lumbar spine (2-3 mm) (L4/L5); and Lumbosacral facet hypertrophy (Multilevel) (Bilateral) on their pertinent problem list. Pain Assessment: Severity of Chronic pain is reported as a 5 /10. Location: Back Right, Left/pain radiaties down to both hip. Onset: More than a month ago. Quality: Aching, Constant, Sharp. Timing: Constant. Modifying factor(s): meds and rest. Vitals:  height is 5' 5" (1.651 m) and weight is 240 lb (108.9 kg). Her temporal temperature is 96.6 F (35.9 C) (abnormal). Her blood pressure is 126/90 and her pulse is 74. Her respiration is 16 and oxygen saturation is 97%.   Reason for encounter: medication management.   The patient indicates doing well with the current medication regimen. No adverse reactions or side effects reported to the medications.   She seems to have lost some weight but she still having pain on the left knee.  Today we talked about several alternatives including the availability of Monovisc.  She indicated being interested in having a left intra-articular knee joint injection with Monovisc.  She is well aware that this injections are palliative and not meant to provide her with  permanent relief of the pain.  RTCB: 05/30/2021 Nonopioids transferred 05/03/2020: Flexeril, Neurontin, and calcium  Pharmacotherapy Assessment  Analgesic: Hydrocodone/APAP 5/325, 1 tab PO q 8 hrs (15 mg/day of hydrocodone)  MME/day: 15 mg/day.   Monitoring: [place] PMP: PDMP reviewed during this encounter.       Pharmacotherapy: No side-effects or adverse reactions reported. Compliance: No problems identified. Effectiveness: Clinically acceptable.  Chauncey Fischer, RN  02/21/2021  3:13 PM  Sign when Signing Visit Nursing Pain Medication Assessment:  Safety precautions to be maintained throughout the outpatient stay will include: orient to surroundings, keep bed in low position, maintain call bell within reach at all times, provide assistance with transfer out of bed and ambulation.  Medication Inspection Compliance: Pill count conducted under aseptic conditions, in front of the patient. Neither the pills nor the bottle was removed from the patient's sight at any time. Once count was completed pills were immediately returned to the patient in their original bottle.  Medication: Hydrocodone/APAP Pill/Patch Count:  36 of 90 pills remain Pill/Patch Appearance: Markings consistent with prescribed medication Bottle Appearance: Standard pharmacy container. Clearly labeled. Filled Date: 7 / 12 / 2022 Last Medication intake:  Today Safety precautions to be maintained throughout the outpatient stay will include: orient to surroundings, keep bed in low position, maintain call bell within reach at all times, provide assistance with transfer out of bed and ambulation.      UDS:  Summary  Date Value Ref Range Status  02/04/2020 Note  Final    Comment:    ==================================================================== [employer] Select 13 (MW) ==================================================================== Test                             Result       Flag       Units  Drug Present and  Declared for Prescription Verification   Hydrocodone                    723          EXPECTED   ng/mg creat   Hydromorphone                  138          EXPECTED   ng/mg creat   Dihydrocodeine                 73           EXPECTED   ng/mg creat   Norhydrocodone                 1435         EXPECTED   ng/mg creat    Sources of hydrocodone include scheduled prescription medications.    Hydromorphone, dihydrocodeine and norhydrocodone are expected    metabolites of hydrocodone. Hydromorphone and dihydrocodeine are    also available as scheduled prescription medications.  ==================================================================== Test                      Result    Flag   Units      Ref Range   Creatinine              133              mg/dL      >=20 ==================================================================== Declared Medications:  The flagging and interpretation on this report are based on the  following declared medications.  Unexpected results may arise from  inaccuracies in the declared medications.   **Note: The testing scope of this panel includes these medications:   Hydrocodone (Norco)   **Note: The testing scope of  this panel does not include the  following reported medications:   Acetaminophen (Norco)  Aspirin  Atorvastatin (Lipitor)  Calcium  Cyclobenzaprine (Flexeril)  Gabapentin (Neurontin)  Hydrochlorothiazide (Hydrodiuril)  Lisinopril (Zestril)  Sertraline (Zoloft)  Vitamin D  Vitamin D2 (Drisdol) ==================================================================== For clinical consultation, please call [phone]. ====================================================================      ROS  Constitutional: Denies any fever or chills Gastrointestinal: No reported hemesis, hematochezia, vomiting, or acute GI distress Musculoskeletal: Denies any acute onset joint swelling, redness, loss of ROM, or weakness Neurological: No reported  episodes of acute onset apraxia, aphasia, dysarthria, agnosia, amnesia, paralysis, loss of coordination, or loss of consciousness  Medication Review  HYDROcodone-acetaminophen, Vitamin D, Vitamin D (Ergocalciferol), aspirin EC, atorvastatin, calcium carbonate, cyclobenzaprine, gabapentin, hydrochlorothiazide, lisinopril, and sertraline  History Review  Allergy: Ms. Schenk is allergic to simvastatin. Drug: Ms. Canlas  reports no history of drug use. Alcohol:  reports no history of alcohol use. Tobacco:  reports that she has never smoked. She has never used smokeless tobacco. Social: Ms. Derrick  reports that she has never smoked. She has never used smokeless tobacco. She reports that she does not drink alcohol and does not use drugs. Medical:  has a past medical history of Allergy, Chronic back pain, Depression, DJD (degenerative joint disease), Hyperlipidemia, Hypertension, Menopause, Musculoskeletal pain (12/07/2015), and TMJ PAIN (05/17/2010). Surgical: Ms. Truax  has a past surgical history that includes G3 P3 . Family: family history includes Breast cancer in her maternal aunt and sister; Cancer in her brother and sister; Colon cancer in her brother; Diabetes in an other family member; Kidney cancer in her son.  Laboratory Chemistry Profile   Renal Lab Results  Component Value Date   BUN 10 01/16/2020   CREATININE 0.66 64/40/3474   BCR NOT APPLICABLE 25/95/6387   GFR 105.13 12/24/2018   GFRAA 103 02/13/2019   GFRNONAA 89 02/13/2019    Hepatic Lab Results  Component Value Date   AST 19 01/16/2020   ALT 22 01/16/2020   ALBUMIN 4.5 02/13/2019   ALKPHOS 87 02/13/2019   HCVAB NEGATIVE 08/30/2016    Electrolytes Lab Results  Component Value Date   NA 138 01/16/2020   K 4.3 01/16/2020   CL 103 01/16/2020   CALCIUM 9.8 01/16/2020   MG 1.8 02/13/2019    Bone Lab Results  Component Value Date   VD25OH 25 (L) 01/16/2020   VD125OH2TOT 68 12/24/2017   [id number] 68 12/24/2017    VD2125OH2 <8 12/24/2017   25OHVITD1 22 (L) 02/13/2019   25OHVITD2 3.4 02/13/2019   25OHVITD3 19 02/13/2019    Inflammation (CRP: Acute Phase) (ESR: Chronic Phase) Lab Results  Component Value Date   CRP 8 02/13/2019   ESRSEDRATE 22 01/16/2020         Note: Above Lab results reviewed.  Recent Imaging Review  DG PAIN CLINIC C-ARM 1-60 MIN NO REPORT Fluoro was used, but no Radiologist interpretation will be provided.  Please refer to "NOTES" tab for provider progress note. Note: Reviewed        Physical Exam  General appearance: Well nourished, well developed, and well hydrated. In no apparent acute distress Mental status: Alert, oriented x 3 (person, place, & time)       Respiratory: No evidence of acute respiratory distress Eyes: PERLA Vitals: BP 126/90 (BP Location: Right Arm, Patient Position: Sitting, Cuff Size: Normal)   Pulse 74   Temp (!) 96.6 F (35.9 C) (Temporal)   Resp 16   Ht 5' 5" (1.651 m)  Wt 240 lb (108.9 kg)   SpO2 97%   BMI 39.94 kg/m  BMI: Estimated body mass index is 39.94 kg/m as calculated from the following:   Height as of this encounter: 5' 5" (1.651 m).   Weight as of this encounter: 240 lb (108.9 kg). Ideal: Ideal body weight: 57 kg (125 lb 10.6 oz) Adjusted ideal body weight: 77.7 kg (171 lb 6.4 oz)  Assessment   Status Diagnosis  Controlled Controlled Controlled 1. Chronic pain syndrome   2. Pharmacologic therapy   3. Chronic use of opiate for therapeutic purpose   4. Encounter for medication management   5. Chronic low back pain (Bilateral) (R>L) w/o sciatica   6. Lumbar facet syndrome (Bilateral) (R>L)   7. DDD (degenerative disc disease), thoracolumbar   8. Grade 1 Anterolisthesis of lumbosacral spine (4 mm) (L5/S1)   9. Grade 1  Anterolisthesis of lumbar spine (2-3 mm) (L4/L5)   10. Arthropathy of knee (Left)   11. Chronic knee pain (Left)   12. Osteoarthritis of knee (Left)   13. Neurogenic pain   14. Fibromyalgia       Updated Problems: No problems updated.  Plan of Care  Problem-specific:  No problem-specific Assessment & Plan notes found for this encounter.  Wanda [name] has a current medication list which includes the following long-term medication(s): atorvastatin, hydrochlorothiazide, [START ON 03/01/2021] hydrocodone-acetaminophen, [START ON 03/31/2021] hydrocodone-acetaminophen, [START ON 04/30/2021] hydrocodone-acetaminophen, lisinopril, sertraline, calcium carbonate, cyclobenzaprine, and gabapentin.  Pharmacotherapy (Medications Ordered): Meds ordered this encounter  Medications   HYDROcodone-acetaminophen (NORCO/VICODIN) 5-325 MG tablet    Sig: Take 1 tablet by mouth 3 (three) times daily as needed for severe pain. Must last 30 days    Dispense:  90 tablet    Refill:  0    Not a duplicate. Do NOT delete! Dispense 1 day early if closed on refill date. Avoid benzodiazepines within 8 hours of opioids. Do not send refill requests.   HYDROcodone-acetaminophen (NORCO/VICODIN) 5-325 MG tablet    Sig: Take 1 tablet by mouth 3 (three) times daily as needed for severe pain. Must last 30 days    Dispense:  90 tablet    Refill:  0    Not a duplicate. Do NOT delete! Dispense 1 day early if closed on refill date. Avoid benzodiazepines within 8 hours of opioids. Do not send refill requests.   HYDROcodone-acetaminophen (NORCO/VICODIN) 5-325 MG tablet    Sig: Take 1 tablet by mouth 3 (three) times daily as needed for severe pain. Must last 30 days    Dispense:  90 tablet    Refill:  0    Not a duplicate. Do NOT delete! Dispense 1 day early if closed on refill date. Avoid benzodiazepines within 8 hours of opioids. Do not send refill requests.   gabapentin (NEURONTIN) 300 MG capsule    Sig: Take 1 capsule (300 mg total) by mouth 3 (three) times daily.    Dispense:  90 capsule    Refill:  0    Fill one day early if pharmacy is closed on scheduled refill date. May substitute for generic if available.    Orders:  Orders Placed This Encounter  Procedures   KNEE INJECTION    Indications: Knee arthralgia (pain) due to osteoarthritis (OA) Imaging: None ([id number]) Nursing: Please request pharmacy to have Monovisc (Hyaluronan) available in office.    Standing Status:   Future    Standing Expiration Date:   03/24/2021    Scheduling Instructions:  Procedure: Knee injection Monovisc (Hyaluronan)     Treatment No.: 1     Level: Intra-articular     Laterality: Left Knee     Sedation: Patient's choice.     Timeframe: As soon as schedule allows    Order Specific Question:   Where will this procedure be performed?    Answer:   [facility]   [facility] Select 13 (MW), Urine    Volume: 30 ml(s). Minimum 3 ml of urine is needed. Document temperature of fresh sample. Indications: Long term (current) use of opiate analgesic [phone])    Order Specific Question:   Release to patient    Answer:   Immediate   Informed Consent Details: Physician/Practitioner Attestation; Transcribe to consent form and obtain patient signature    Note: Always confirm laterality of pain with Ms. Rafferty, before procedure. Transcribe to consent form and obtain patient signature.    Order Specific Question:   Physician/Practitioner attestation of informed consent for procedure/surgical case    Answer:   I, the physician/practitioner, attest that I have discussed with the patient the benefits, risks, side effects, alternatives, likelihood of achieving goals and potential problems during recovery for the procedure that I have provided informed consent.    Order Specific Question:   Procedure    Answer:   Therapeutic, left sided, intra-articular viscosupplementation knee injection    Order Specific Question:   Physician/Practitioner performing the procedure    Answer:   Francisco A. Dossie Arbour, MD    Order Specific Question:   Indication/Reason    Answer:   Chronic left knee pain secondary to primary osteoarthritis of  left knee (M17.12)   Follow-up plan:   Return in about 14 weeks (around 05/30/2021) for (F2F-MM) E/M-day (M,W), in addition, Procedure (no sedation): (L) Monovisc Knee inj #1 (ASAA).     Interventional Therapies  Risk  Complexity Considerations:   NOTE: NO LUMBAR RFA until BMI <35.   Planned  Pending:   Therapeutic/palliative bilateral lumbar facet MBB #R5L3  IM Toradol/Norflex 60/60 mg (12/01/2020)    Under consideration:   Diagnostic left genicular NB #1    Completed:   Therapeutic right lumbar facet MBB x4 (04/01/2020)  Therapeutic left lumbar facet MBB x2 (11/18/2019)  Therapeutic right IA steroid knee injection x1 (08/15/2018)  Therapeutic left IA steroid knee injection x2 (08/15/2018)  Therapeutic left IA Hyalgan knee injection x9 (10/07/2020)    Therapeutic  Palliative (PRN) options:   Palliative left lumbar facet MBB #3  Palliative right lumbar facet MBB #5  Palliative left IA Hyalgan knee injection #10  Palliative left vs bilateral IA knee injection #3 (w/ steroid)        Recent Visits Date Type Provider Dept  01/06/21 Telemedicine Milinda Pointer, MD [facility]  12/23/20 Procedure visit Milinda Pointer, MD [facility]  12/01/20 Office Visit Milinda Pointer, MD [facility]  Showing recent visits within past 90 days and meeting all other requirements Today's Visits Date Type Provider Dept  02/21/21 Office Visit Milinda Pointer, MD [facility]  Showing today's visits and meeting all other requirements Future Appointments No visits were found meeting these conditions. Showing future appointments within next 90 days and meeting all other requirements I discussed the assessment and treatment plan with the patient. The patient was provided an opportunity to ask questions and all were answered. The patient agreed with the plan and demonstrated an understanding of the instructions.  Patient advised to call back or seek an  in-person  evaluation if the symptoms or condition worsens.  Duration of encounter: 30 minutes.  Note by: Gaspar Cola, MD Date: 02/21/2021; Time: 3:22 PM

## 2021-02-21 ENCOUNTER — Encounter: Payer: Self-pay | Admitting: Pain Medicine

## 2021-02-21 ENCOUNTER — Ambulatory Visit: Payer: Medicare HMO | Attending: Pain Medicine | Admitting: Pain Medicine

## 2021-02-21 ENCOUNTER — Other Ambulatory Visit: Payer: Self-pay

## 2021-02-21 VITALS — BP 126/90 | HR 74 | Temp 96.6°F | Resp 16 | Ht 65.0 in | Wt 240.0 lb

## 2021-02-21 DIAGNOSIS — M545 Low back pain, unspecified: Secondary | ICD-10-CM | POA: Diagnosis not present

## 2021-02-21 DIAGNOSIS — M797 Fibromyalgia: Secondary | ICD-10-CM | POA: Diagnosis present

## 2021-02-21 DIAGNOSIS — M4316 Spondylolisthesis, lumbar region: Secondary | ICD-10-CM | POA: Insufficient documentation

## 2021-02-21 DIAGNOSIS — M792 Neuralgia and neuritis, unspecified: Secondary | ICD-10-CM | POA: Diagnosis present

## 2021-02-21 DIAGNOSIS — Z79899 Other long term (current) drug therapy: Secondary | ICD-10-CM | POA: Diagnosis not present

## 2021-02-21 DIAGNOSIS — G894 Chronic pain syndrome: Secondary | ICD-10-CM | POA: Insufficient documentation

## 2021-02-21 DIAGNOSIS — G8929 Other chronic pain: Secondary | ICD-10-CM | POA: Diagnosis not present

## 2021-02-21 DIAGNOSIS — M47816 Spondylosis without myelopathy or radiculopathy, lumbar region: Secondary | ICD-10-CM | POA: Diagnosis not present

## 2021-02-21 DIAGNOSIS — M25562 Pain in left knee: Secondary | ICD-10-CM

## 2021-02-21 DIAGNOSIS — M5135 Other intervertebral disc degeneration, thoracolumbar region: Secondary | ICD-10-CM | POA: Diagnosis not present

## 2021-02-21 DIAGNOSIS — M1712 Unilateral primary osteoarthritis, left knee: Secondary | ICD-10-CM | POA: Insufficient documentation

## 2021-02-21 DIAGNOSIS — Z79891 Long term (current) use of opiate analgesic: Secondary | ICD-10-CM | POA: Diagnosis not present

## 2021-02-21 DIAGNOSIS — M4317 Spondylolisthesis, lumbosacral region: Secondary | ICD-10-CM

## 2021-02-21 MED ORDER — HYDROCODONE-ACETAMINOPHEN 5-325 MG PO TABS: 1.0000 | ORAL_TABLET | Freq: Three times a day (TID) | ORAL | 0 refills | Status: DC | PRN

## 2021-02-21 MED ORDER — GABAPENTIN 300 MG PO CAPS: 300.0000 mg | ORAL_CAPSULE | Freq: Three times a day (TID) | ORAL | 0 refills | Status: DC

## 2021-02-21 NOTE — Progress Notes (Signed)
Nursing Pain Medication Assessment:  Safety precautions to be maintained throughout the outpatient stay will include: orient to surroundings, keep bed in low position, maintain call bell within reach at all times, provide assistance with transfer out of bed and ambulation.  Medication Inspection Compliance: Pill count conducted under aseptic conditions, in front of the patient. Neither the pills nor the bottle was removed from the patient's sight at any time. Once count was completed pills were immediately returned to the patient in their original bottle.  Medication: Hydrocodone/APAP Pill/Patch Count:  36 of 90 pills remain Pill/Patch Appearance: Markings consistent with prescribed medication Bottle Appearance: Standard pharmacy container. Clearly labeled. Filled Date: 7 / 12 / 2022 Last Medication intake:  Today Safety precautions to be maintained throughout the outpatient stay will include: orient to surroundings, keep bed in low position, maintain call bell within reach at all times, provide assistance with transfer out of bed and ambulation.

## 2021-02-24 LAB — TOXASSURE SELECT 13 (MW), URINE

## 2021-03-01 ENCOUNTER — Encounter: Payer: Self-pay | Admitting: Internal Medicine

## 2021-03-01 ENCOUNTER — Ambulatory Visit (INDEPENDENT_AMBULATORY_CARE_PROVIDER_SITE_OTHER): Payer: Medicare HMO | Admitting: Internal Medicine

## 2021-03-01 ENCOUNTER — Other Ambulatory Visit: Payer: Self-pay

## 2021-03-01 VITALS — BP 134/74 | HR 68 | Temp 98.3°F | Resp 16 | Ht 65.0 in | Wt 229.1 lb

## 2021-03-01 DIAGNOSIS — M792 Neuralgia and neuritis, unspecified: Secondary | ICD-10-CM | POA: Diagnosis not present

## 2021-03-01 DIAGNOSIS — Z01419 Encounter for gynecological examination (general) (routine) without abnormal findings: Secondary | ICD-10-CM

## 2021-03-01 DIAGNOSIS — M797 Fibromyalgia: Secondary | ICD-10-CM

## 2021-03-01 DIAGNOSIS — I10 Essential (primary) hypertension: Secondary | ICD-10-CM

## 2021-03-01 DIAGNOSIS — E559 Vitamin D deficiency, unspecified: Secondary | ICD-10-CM

## 2021-03-01 DIAGNOSIS — Z0000 Encounter for general adult medical examination without abnormal findings: Secondary | ICD-10-CM

## 2021-03-01 DIAGNOSIS — Z780 Asymptomatic menopausal state: Secondary | ICD-10-CM | POA: Diagnosis not present

## 2021-03-01 DIAGNOSIS — E785 Hyperlipidemia, unspecified: Secondary | ICD-10-CM | POA: Diagnosis not present

## 2021-03-01 LAB — COMPREHENSIVE METABOLIC PANEL
ALT: 16 U/L (ref 0–35)
AST: 19 U/L (ref 0–37)
Albumin: 4.1 g/dL (ref 3.5–5.2)
Alkaline Phosphatase: 76 U/L (ref 39–117)
BUN: 10 mg/dL (ref 6–23)
CO2: 28 mEq/L (ref 19–32)
Calcium: 9.8 mg/dL (ref 8.4–10.5)
Chloride: 100 mEq/L (ref 96–112)
Creatinine, Ser: 0.67 mg/dL (ref 0.40–1.20)
GFR: 87.15 mL/min (ref >60.00)
Glucose, Bld: 87 mg/dL (ref 70–99)
Potassium: 3.9 mEq/L (ref 3.5–5.1)
Sodium: 136 mEq/L (ref 135–145)
Total Bilirubin: 0.4 mg/dL (ref 0.2–1.2)
Total Protein: 7.1 g/dL (ref 6.0–8.3)

## 2021-03-01 LAB — CBC WITH DIFFERENTIAL/PLATELET
Basophils Absolute: 0 10*3/uL (ref 0.0–0.1)
Basophils Relative: 0.6 % (ref 0.0–3.0)
Eosinophils Absolute: 0.1 10*3/uL (ref 0.0–0.7)
Eosinophils Relative: 1.6 % (ref 0.0–5.0)
HCT: 40.8 % (ref 36.0–46.0)
Hemoglobin: 13.4 g/dL (ref 12.0–15.0)
Lymphocytes Relative: 44.6 % (ref 12.0–46.0)
Lymphs Abs: 2.1 10*3/uL (ref 0.7–4.0)
MCHC: 32.9 g/dL (ref 30.0–36.0)
MCV: 95.2 fl (ref 78.0–100.0)
Monocytes Absolute: 0.4 10*3/uL (ref 0.1–1.0)
Monocytes Relative: 9.1 % (ref 3.0–12.0)
Neutro Abs: 2.1 10*3/uL (ref 1.4–7.7)
Neutrophils Relative %: 44.1 % (ref 43.0–77.0)
Platelets: 175 10*3/uL (ref 150.0–400.0)
RBC: 4.29 Mil/uL (ref 3.87–5.11)
RDW: 16.1 % — ABNORMAL HIGH (ref 11.5–15.5)
WBC: 4.8 10*3/uL (ref 4.0–10.5)

## 2021-03-01 LAB — LIPID PANEL
Cholesterol: 224 mg/dL — ABNORMAL HIGH (ref 0–200)
HDL: 53.2 mg/dL (ref >39.00)
LDL Cholesterol: 156 mg/dL — ABNORMAL HIGH (ref 0–99)
NonHDL: 170.75
Total CHOL/HDL Ratio: 4
Triglycerides: 74 mg/dL (ref 0.0–149.0)
VLDL: 14.8 mg/dL (ref 0.0–40.0)

## 2021-03-01 LAB — VITAMIN D 25 HYDROXY (VIT D DEFICIENCY, FRACTURES): VITD: 25.57 ng/mL — ABNORMAL LOW (ref 30.00–100.00)

## 2021-03-01 MED ORDER — GABAPENTIN 300 MG PO CAPS: 300.0000 mg | ORAL_CAPSULE | Freq: Three times a day (TID) | ORAL | 1 refills | Status: DC

## 2021-03-01 NOTE — Patient Instructions (Addendum)
Recommend to get covid #4 and Shingrix (this is a 2 dose series, you will need to get the 2nd dose 2-6 months after the first)- you may get these at our pharmacy downstairs today if you would like.   Also, you are overdue for a recall colonoscopy with Dr. Ardis Hughs- please call their office at [phone] to get on their schedule.  GO TO THE LAB : Get the blood work     [place], [place] Come back for   for checkup in 6 months   The breast center will schedule bone density test.  Call them if you will hear from them in few days    "Living will", "[place] of attorney": Advanced care planning  (If you already have a living will or healthcare power of attorney, please bring the copy to be scanned in your chart.)  Advance care planning is a process that supports adults in  understanding and sharing their preferences regarding future medical care.   The patient's preferences are recorded in documents called Advance Directives.    Advanced directives are completed (and can be modified at any time) while the patient is in full mental capacity.   The documentation should be available at all times to the patient, the family and the healthcare providers.  Bring in a copy to be scanned in your chart is an excellent idea and is recommended   This legal documents direct treatment decision making and/or appoint a surrogate to make the decision if the patient is not capable to do so.    Advance directives can be documented in many types of formats,  documents have names such as:  Lliving will  Durable power of attorney for healthcare (healthcare proxy or healthcare power of attorney)  Combined directives  Physician orders for life-sustaining treatment    More information at:  [email or web address]

## 2021-03-01 NOTE — Progress Notes (Signed)
Subjective:    Patient ID: Wanda [name], female    DOB: 1946/03/09, 75 y.o.   MRN: [id number]  DOS:  03/01/2021 Type of visit - description: CPX  Since the last office visit has no new concerns. Continue with chronic pain managed elsewhere Has allergies, mostly nasal congestion, uses a sporadically OTCs.  Review of Systems  Other than above, a 14 point review of systems is negative     Past Medical History:  Diagnosis Date   Allergy    RHINITIS   Chronic back pain    Depression    DJD (degenerative joint disease)    back pain , on disability   Hyperlipidemia    Hypertension    Menopause    onset age 35    Musculoskeletal pain 12/07/2015   TMJ PAIN 05/17/2010   Qualifier: Diagnosis of  By: Larose Kells MD, [place]     Past Surgical History:  Procedure Laterality Date   G3 P3      Social History   Socioeconomic History   Marital status: Married    Spouse name: Not on file   Number of children: 3   Years of education: Not on file   Highest education level: Not on file  Occupational History   Occupation: ON DISABILITY  Tobacco Use   Smoking status: Never   Smokeless tobacco: Never  Vaping Use   Vaping Use: Never used  Substance and Sexual Activity   Alcohol use: No   Drug use: No   Sexual activity: Not Currently  Other Topics Concern   Not on file  Social History Narrative   Married, 3 children (2 sons, a daughter), 6 G-kids    Household:  pt, husband        Social Determinants of Radio broadcast assistant Strain: Not on file  Food Insecurity: Not on file  Transportation Needs: Not on file  Physical Activity: Not on file  Stress: Not on file  Social Connections: Not on file  Intimate Partner Violence: Not on file    Allergies as of 03/01/2021       Reactions   Simvastatin    REACTION: cramps        Medication List        Accurate as of March 01, 2021 11:59 PM. If you have any questions, ask your nurse or doctor.          STOP taking  these medications    aspirin EC 81 MG tablet Stopped by: Kathlene November, MD   Vitamin D (Ergocalciferol) 1.25 MG (50000 UNIT) Caps capsule Commonly known as: DRISDOL Stopped by: Kathlene November, MD       TAKE these medications    atorvastatin 40 MG tablet Commonly known as: LIPITOR Take 1 tablet (40 mg total) by mouth daily.   calcium carbonate 600 MG Tabs tablet Commonly known as: Calcium 600 Take 1 tablet (600 mg total) by mouth 2 (two) times daily with a meal.   cyclobenzaprine 10 MG tablet Commonly known as: FLEXERIL Take 1 tablet (10 mg total) by mouth at bedtime.   gabapentin 300 MG capsule Commonly known as: NEURONTIN Take 1 capsule (300 mg total) by mouth 3 (three) times daily.   hydrochlorothiazide 25 MG tablet Commonly known as: HYDRODIURIL Take 1 tablet (25 mg total) by mouth daily.   HYDROcodone-acetaminophen 5-325 MG tablet Commonly known as: NORCO/VICODIN Take 1 tablet by mouth 3 (three) times daily as needed for severe pain. Must last 30 days  HYDROcodone-acetaminophen 5-325 MG tablet Commonly known as: NORCO/VICODIN Take 1 tablet by mouth 3 (three) times daily as needed for severe pain. Must last 30 days Start taking on: March 31, 2021   HYDROcodone-acetaminophen 5-325 MG tablet Commonly known as: NORCO/VICODIN Take 1 tablet by mouth 3 (three) times daily as needed for severe pain. Must last 30 days Start taking on: April 30, 2021   lisinopril 10 MG tablet Commonly known as: ZESTRIL Take 1 tablet (10 mg total) by mouth daily.   sertraline 50 MG tablet Commonly known as: ZOLOFT Take 3 tablets (150 mg total) by mouth daily.   Vitamin D 50 MCG (2000 UT) tablet Take 2,000 Units by mouth daily.           Objective:   Physical Exam BP 134/74 (BP Location: Left Arm, Patient Position: Sitting, Cuff Size: Normal)   Pulse 68   Temp 98.3 F (36.8 C) (Oral)   Resp 16   Ht '5\' 5"'$  (1.651 m)   Wt 229 lb 2 oz (103.9 kg)   SpO2 98%   BMI 38.13 kg/m   General: Well developed, NAD, BMI noted Neck: No  thyromegaly  HEENT:  Normocephalic . Face symmetric, atraumatic. TMs: Normal.  Throat symmetric and not red.  Nose congested Lungs:  CTA B Normal respiratory effort, no intercostal retractions, no accessory muscle use. Heart: RRR,  no murmur.  Abdomen:  Not distended, soft, non-tender. No rebound or rigidity.   Lower extremities: no pretibial edema bilaterally  Skin: Exposed areas without rash. Not pale. Not jaundice Neurologic:  alert & oriented X3.  Speech normal, gait: Assisted by a cane.  Needed help transferring Strength symmetric and appropriate for age.  Psych: Cognition and judgment appear intact.  Cooperative with normal attention span and concentration.  Behavior appropriate. No anxious or depressed appearing.     Assessment     Assessment HTN Hyperlipidemia Depression Morbid obesity MSK: DJD, chronic back-knee pain, hydrocodone /gabapentin rx by DR Shelly Rubenstein D  Def  PLAN:  Here for CPX HTN: BP today is very good, continue HCTZ, lisinopril.  Checking labs Hyperlipidemia: On atorvastatin, labs. Depression: On Zoloft, things are stable. Morbid obesity: Diet and exercise discussed. Chronic pain: Per pain management, request to refill gabapentin, I will. Vitamin D deficiency: Had ergocalciferol, now on OTCs, check labs. RTC 6 months   This visit occurred during the SARS-CoV-2 public health emergency.  Safety protocols were in place, including screening questions prior to the visit, additional usage of staff PPE, and extensive cleaning of exam room while observing appropriate contact time as indicated for disinfecting solutions.

## 2021-03-02 ENCOUNTER — Encounter: Payer: Self-pay | Admitting: Internal Medicine

## 2021-03-02 NOTE — Assessment & Plan Note (Signed)
-   Td 2019 -PNM 23 2016;-PNM 13 2015 - zostavax: 2014 -Shingrix: recommended  -Covid vax x 3, rec #4 - flu shot q year recommended -Female care:  Last PAP 2017, previous gyn referal failed , re refer  [facility] 11/2020 per [facility]  -CCS: cscope 2014, due for a repeat cscope, recommend to call GI directly. -DEXA  2012: Normal (and  improved compared to 2009 per report).    Recheck a DEXA -Labs: CMP, FLP, CBC, vitamin D -Diet: Discussed - POA discussed

## 2021-03-02 NOTE — Assessment & Plan Note (Signed)
Here for CPX HTN: BP today is very good, continue HCTZ, lisinopril.  Checking labs Hyperlipidemia: On atorvastatin, labs. Depression: On Zoloft, things are stable. Morbid obesity: Diet and exercise discussed. Chronic pain: Per pain management, request to refill gabapentin, I will. Vitamin D deficiency: Had ergocalciferol, now on OTCs, check labs. RTC 6 months

## 2021-03-04 NOTE — Addendum Note (Signed)
Addended byDamita Dunnings D on: 03/04/2021 10:05 AM   Modules accepted: Orders

## 2021-03-11 ENCOUNTER — Other Ambulatory Visit: Payer: Self-pay

## 2021-03-11 ENCOUNTER — Emergency Department (HOSPITAL_BASED_OUTPATIENT_CLINIC_OR_DEPARTMENT_OTHER)
Admission: EM | Admit: 2021-03-11 | Discharge: 2021-03-11 | Disposition: A | Discharge status: Home / Self Care | Payer: Medicare HMO | Attending: Emergency Medicine | Admitting: Emergency Medicine

## 2021-03-11 ENCOUNTER — Encounter (HOSPITAL_BASED_OUTPATIENT_CLINIC_OR_DEPARTMENT_OTHER): Payer: Self-pay | Admitting: *Deleted

## 2021-03-11 DIAGNOSIS — M25511 Pain in right shoulder: Secondary | ICD-10-CM | POA: Diagnosis not present

## 2021-03-11 DIAGNOSIS — Z79899 Other long term (current) drug therapy: Secondary | ICD-10-CM | POA: Insufficient documentation

## 2021-03-11 DIAGNOSIS — S46911A Strain of unspecified muscle, fascia and tendon at shoulder and upper arm level, right arm, initial encounter: Secondary | ICD-10-CM | POA: Diagnosis not present

## 2021-03-11 DIAGNOSIS — I10 Essential (primary) hypertension: Secondary | ICD-10-CM | POA: Insufficient documentation

## 2021-03-11 MED ORDER — CYCLOBENZAPRINE HCL 5 MG PO TABS
5.0000 mg | ORAL_TABLET | Freq: Once | ORAL | Status: AC
  Administered 2021-03-11: 5 mg via ORAL
  Filled 2021-03-11: qty 1

## 2021-03-11 MED ORDER — LIDOCAINE 5 % EX PTCH: 1.0000 | MEDICATED_PATCH | CUTANEOUS | 0 refills | Status: DC

## 2021-03-11 MED ORDER — CYCLOBENZAPRINE HCL 5 MG PO TABS: 5.0000 mg | ORAL_TABLET | Freq: Three times a day (TID) | ORAL | 0 refills | Status: DC | PRN

## 2021-03-11 NOTE — Discharge Instructions (Addendum)
You likely have muscle strain or muscle spasms.  Continue your pain medicine as prescribed by your doctor as well as gabapentin and Motrin  I will add Flexeril and lidocaine patch.  See your doctor for follow-up  Return to ER if you have worse shoulder pain, back pain, numbness or weakness
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
## 2021-03-16 ENCOUNTER — Encounter (HOSPITAL_BASED_OUTPATIENT_CLINIC_OR_DEPARTMENT_OTHER): Payer: Self-pay

## 2021-03-16 ENCOUNTER — Emergency Department (HOSPITAL_BASED_OUTPATIENT_CLINIC_OR_DEPARTMENT_OTHER)
Admission: EM | Admit: 2021-03-16 | Discharge: 2021-03-16 | Disposition: A | Discharge status: Home / Self Care | Payer: Medicare HMO | Attending: Emergency Medicine | Admitting: Emergency Medicine

## 2021-03-16 ENCOUNTER — Other Ambulatory Visit: Payer: Self-pay

## 2021-03-16 DIAGNOSIS — M549 Dorsalgia, unspecified: Secondary | ICD-10-CM | POA: Diagnosis not present

## 2021-03-16 DIAGNOSIS — I10 Essential (primary) hypertension: Secondary | ICD-10-CM | POA: Insufficient documentation

## 2021-03-16 DIAGNOSIS — M25511 Pain in right shoulder: Secondary | ICD-10-CM | POA: Diagnosis not present

## 2021-03-16 DIAGNOSIS — Z79899 Other long term (current) drug therapy: Secondary | ICD-10-CM | POA: Diagnosis not present

## 2021-03-16 MED ORDER — DICLOFENAC SODIUM 1 % EX GEL: 2.0000 g | Freq: Four times a day (QID) | CUTANEOUS | 0 refills | Status: DC

## 2021-03-16 MED ORDER — CYCLOBENZAPRINE HCL 10 MG PO TABS: 10.0000 mg | ORAL_TABLET | Freq: Two times a day (BID) | ORAL | 0 refills | Status: DC | PRN

## 2021-03-16 MED ORDER — FENTANYL CITRATE (PF) 100 MCG/2ML IJ SOLN
50.0000 ug | Freq: Once | INTRAMUSCULAR | Status: AC
  Administered 2021-03-16: 50 ug via INTRAMUSCULAR
  Filled 2021-03-16: qty 2

## 2021-03-16 NOTE — ED Triage Notes (Signed)
Pt c/o right shoulder pain x 1 week-denies injury-states she was seen here for same last week-NAD-steady/slow gait

## 2021-03-16 NOTE — ED Notes (Signed)
Pt discharged to home. Discharge instructions have been discussed with patient and/or family members. Pt verbally acknowledges understanding d/c instructions, and endorses comprehension to checkout at registration before leaving.  °
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
## 2021-03-16 NOTE — Discharge Instructions (Addendum)
Use voltaren as directed   Prescription given for Flexeril. Take medication as directed and do not operate machinery, drive a car, or work while taking this medication as it can make you drowsy.   Please follow up with your primary care provider or with the orthopedic doctor I have provided a referral for within 5-7 days for re-evaluation of your symptoms. If you do not have a primary care provider, information for a healthcare clinic has been provided for you to make arrangements for follow up care. Please return to the emergency department for any new or worsening symptoms.

## 2021-04-04 NOTE — Progress Notes (Addendum)
PROVIDER NOTE: Interpretation of information contained herein should be left to medically-trained personnel. Specific patient instructions are provided elsewhere under "Patient Instructions" section of medical record. This document was created in part using STT-dictation technology, any transcriptional errors that may result from this process are unintentional.  Patient: Wanda [name] Type: Established DOB: 1945-10-05 MRN: [id number] PCP: Colon Branch, MD  Service: Procedure DOS: 04/05/2021 Setting: Ambulatory Location: Ambulatory outpatient facility Delivery: Face-to-face Provider: Gaspar Cola, MD Specialty: Interventional Pain Management Specialty designation: 09 Location: Outpatient facility Ref. Prov.: Milinda Pointer, MD   Procedure [facility] Interventional Pain Management )    Procedure: Monovisc Knee Injection (first Monovisc injection.  Previously patient had received Hyalgan.) Laterality: Left (-LT) Level: Intra-articular  No.: 1 Series: 3 Purpose: Therapeutic Indications: Knee arthralgia associated to osteoarthritis of the knee   Imaging: None required (CPT-20610) Analgesia: Skin infiltration w/ local anesthetics Sedation: None  NAS-11 score:   Pre-procedure: 2 /10   Post-procedure: 0-No pain/10      1. Chronic knee pain (Left)   2. Enthesopathy of knee region (Left)   3. Osteoarthritis of knee (Left)   4. Tricompartment osteoarthritis of knee (Left)   5. Secondary osteoarthritis of knee (Bilateral)   6. Morbid obesity with body mass index (BMI) of 40.0 to 44.9 in adult [facility])    RTCB: 08/28/2021  Pre-Procedure Preparation  Monitoring: As per clinic protocol.  Risk Assessment: Vitals:  [id number] body mass index is 39.31 kg/m as calculated from the following:   Height as of this encounter: '5\' 4"'$  (1.626 m).   Weight as of this encounter: 229 lb (103.9 kg)., Rate:84 , BP:(!) 124/91, Resp:16, Temp:(!) 97.2 F (36.2 C), SpO2:100 %  Allergies: She is  allergic to simvastatin.  Precautions: None required  Blood-thinner(s): None at this time  Coagulopathies: Reviewed. None identified.   Active Infection(s): Reviewed. None identified. Wanda [name] is afebrile   Location setting: Exam room Position: Sitting w/ knee bent 90 degrees Safety Precautions: Patient was assessed for positional comfort and pressure points before starting the procedure. Prepping solution: DuraPrep (Iodine Povacrylex [0.7% available iodine] and Isopropyl Alcohol, 74% w/w) Prep Area: Entire knee region Approach: percutaneous, just above the tibial plateau, lateral to the infrapatellar tendon. Intended target: Intra-articular knee space Materials: Tray: Block Needle(s): Regular Qty: 1/side Length: 1.5-inch Gauge: 25G   Meds ordered this encounter  Medications   lidocaine (XYLOCAINE) 2 % (with pres) injection 400 mg   pentafluoroprop-tetrafluoroeth (GEBAUERS) aerosol   Hyaluronan SOSY 88 mg   ropivacaine (PF) 2 mg/mL (0.2%) (NAROPIN) injection 5 mL   HYDROcodone-acetaminophen (NORCO/VICODIN) 5-325 MG tablet    Sig: Take 1 tablet by mouth 3 (three) times daily as needed for severe pain. Must last 30 days    Dispense:  90 tablet    Refill:  0    Not a duplicate. Do NOT delete! Dispense 1 day early if closed on refill date. Avoid benzodiazepines within 8 hours of opioids. Do not send refill requests.   HYDROcodone-acetaminophen (NORCO/VICODIN) 5-325 MG tablet    Sig: Take 1 tablet by mouth 3 (three) times daily as needed for severe pain. Must last 30 days    Dispense:  90 tablet    Refill:  0    Not a duplicate. Do NOT delete! Dispense 1 day early if closed on refill date. Avoid benzodiazepines within 8 hours of opioids. Do not send refill requests.   HYDROcodone-acetaminophen (NORCO/VICODIN) 5-325 MG tablet    Sig: Take 1 tablet by mouth 3 (  three) times daily as needed for severe pain. Must last 30 days    Dispense:  90 tablet    Refill:  0    Not a duplicate.  Do NOT delete! Dispense 1 day early if closed on refill date. Avoid benzodiazepines within 8 hours of opioids. Do not send refill requests.     Orders Placed This Encounter  Procedures   KNEE INJECTION    Indications: Knee arthralgia (pain) due to osteoarthritis (OA) Imaging: Fluoroscopy [id number]) Position: Supine in semi-sitting position. Pillows under knee, bent 45-degree angle. Equipment/Materials: Block tray  1.5", 25-G (one per side)  Local anesthetic  Monovisc (one per side) Confirm availability (in office) of Monovisc (HMW hyaluronan)    Scheduling Instructions:     Procedure: Knee injection Monovisc (Hyaluronan/Hyaluronic acid)     Treatment No.: 1     Level: Intra-articular     Laterality: Left Knee     Sedation: Patient's choice.    Order Specific Question:   Where will this procedure be performed?    Answer:   [facility]   DG PAIN CLINIC C-ARM 1-60 MIN NO REPORT    Intraoperative interpretation by procedural physician at [place].    Standing Status:   Standing    Number of Occurrences:   1    Order Specific Question:   Reason for exam:    Answer:   Assistance in needle guidance and placement for procedures requiring needle placement in or near specific anatomical locations not easily accessible without such assistance.   MR KNEE LEFT WO CONTRAST    Standing Status:   Future    Standing Expiration Date:   05/05/2021    Scheduling Instructions:     Imaging must be done as soon as possible. Inform patient that order will expire within 30 days and I will not renew it.    Order Specific Question:   What is the patient's sedation requirement?    Answer:   No Sedation    Order Specific Question:   Does the patient have a pacemaker or implanted devices?    Answer:   No    Order Specific Question:   Preferred imaging location?    Answer:   [facility] (table limit-350lbs)    Order Specific Question:   Call Results- Best Contact Number?     Answer:   [phone] ([facility])    Order Specific Question:   Radiology Contrast Protocol - do NOT remove file path    Answer:   [email or web address]   Informed Consent Details: Physician/Practitioner Attestation; Transcribe to consent form and obtain patient signature    Note: Always confirm laterality of pain with Wanda [name], before procedure. Transcribe to consent form and obtain patient signature.    Order Specific Question:   Physician/Practitioner attestation of informed consent for procedure/surgical case    Answer:   I, the physician/practitioner, attest that I have discussed with the patient the benefits, risks, side effects, alternatives, likelihood of achieving goals and potential problems during recovery for the procedure that I have provided informed consent.    Order Specific Question:   Procedure    Answer:   Therapeutic, left sided, intra-articular viscosupplementation knee injection    Order Specific Question:   Physician/Practitioner performing the procedure    Answer:   Axel Frisk A. Dossie Arbour, MD    Order Specific Question:   Indication/Reason    Answer:   Chronic left knee pain secondary to primary osteoarthritis of left knee (M17.12)  Provide equipment / supplies at bedside    "Block Tray" (Disposable  single use) Needle type: SpinalSpinal Amount/quantity: 1 Size: Regular (3.5-inch) Gauge: 22G    Standing Status:   Standing    Number of Occurrences:   1    Order Specific Question:   Specify    Answer:   Block Tray      Time-out: [id number] I initiated and conducted the "Time-out" before starting the procedure, as per protocol. The patient was asked to participate by confirming the accuracy of the "Time Out" information. Verification of the correct person, site, and procedure were performed and confirmed by me, the nursing staff, and the patient. "Time-out" conducted as per Joint Commission's Universal Protocol (UP.01.01.01). Procedure  checklist: Completed   H&P (Pre-op  Assessment)  Wanda [name] is a 76 y.o. (year old), female patient, seen today for interventional treatment. She  has a past surgical history that includes G3 P3 . Wanda [name] has a current medication list which includes the following prescription(s): atorvastatin, calcium carbonate, vitamin d, cyclobenzaprine, diclofenac sodium, gabapentin, hydrochlorothiazide, hydrocodone-acetaminophen, [START ON 04/30/2021] hydrocodone-acetaminophen, [START ON 05/30/2021] hydrocodone-acetaminophen, [START ON 06/29/2021] hydrocodone-acetaminophen, [START ON 07/29/2021] hydrocodone-acetaminophen, lidocaine, lisinopril, sertraline, cyclobenzaprine, and cyclobenzaprine. Her primarily concern today is the Knee Pain (Left )  She is allergic to simvastatin.   Last encounter: My last encounter with her was on 02/21/2021. 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); Chronic sacroiliac joint pain (Right); Chronic radicular pain of lower extremity (Left) (S1 dermatome); Osteoarthritis of hips (Bilateral); Chronic knee pain (1ry area of Pain) (Bilateral) (L>R); Osteoarthritis of knee (Bilateral); Chronic hip pain (Bilateral) (L>R); Chronic low back pain (Bilateral) (L>R) w/ sciatica (Left); Chronic musculoskeletal pain; Abnormal MRI, lumbar spine (03/20/2018); Spondylosis without myelopathy or radiculopathy, lumbosacral region; DDD (degenerative disc disease), lumbosacral; Chronic low back pain (Bilateral) (R>L) w/o sciatica; Chronic knee pain (Left); Chronic hip pain (Right); Fibromyalgia; Tricompartment osteoarthritis of knee (Left); Osteoarthritis of knee (Left); Arthropathy of knee  (Left); Secondary osteoarthritis of knee (Bilateral); Enthesopathy of knee region (Left); Numbness and tingling of upper extremity (Left); Cervical radiculopathy at C8 (Left); Cervical radiculopathy at C7 (Left); DDD (degenerative disc disease), thoracolumbar; Chronic sacroiliac joint arthropathy (Bilateral); Degenerative joint disease of sacroiliac joint (Bilateral) ([place]); Osteoarthritis of sacroiliac joints (Bilateral) ([place]); Somatic dysfunction of sacroiliac joints (Bilateral); Lumbosacral facet arthropathy (Multilevel) (Bilateral); Grade 1 Anterolisthesis of lumbosacral spine (4-5 mm) (L5/S1); Grade 1  Anterolisthesis of lumbar spine (2-3 mm) (L4/L5); and Lumbosacral facet hypertrophy (Multilevel) (Bilateral) on their pertinent problem list. Pain Assessment: Severity of Chronic pain is reported as a 2 /10. Location: Knee Left/around to the back of the knee. Onset: More than a month ago. Quality: Discomfort, Constant, Other (Comment) (cutting pain). Timing: Constant. Modifying factor(s): rest. Vitals:  height is '5\' 4"'$  (1.626 m) and weight is 229 lb (103.9 kg). Her temporal temperature is 97.2 F (36.2 C) (abnormal). Her blood pressure is 108/80 and her pulse is 80. Her respiration is 16 and oxygen saturation is 100%.   Reason for encounter: "interventional pain management therapy due pain of at least four (4) weeks in duration, with failure to respond and/or inability to tolerate more conservative care.    Related imaging: Knee-L DG 1-2 views:  Results for orders placed during the hospital encounter of  02/05/20  DG Knee 1-2 Views Left  Narrative CLINICAL DATA:  Chronic left knee pain  EXAM: LEFT KNEE - 1-2 VIEW  COMPARISON:  11/28/2010  FINDINGS: Progressive left knee tricompartmental severe osteoarthritis, most pronounced medially where there is near bone-on-bone contact, sclerosis and bony spurring. Lateral patellofemoral compartments are affected but to a lesser degree. No acute  osseous finding, malalignment, fracture, or effusion.  IMPRESSION: Worsening left knee tricompartmental severe osteoarthritis, most pronounced medially.  No acute osseous finding or effusion.   Electronically Signed By: Jerilynn Mages.  Shick M.D. On: 02/06/2020 10:01    Site Confirmation: Wanda [name] was asked to confirm the procedure and laterality before marking the site.  Consent: Before the procedure and under the influence of no sedative(s), amnesic(s), or anxiolytics, the patient was informed of the treatment options, risks and possible complications. To fulfill our ethical and legal obligations, as recommended by the American Medical Association's Code of Ethics, I have informed the patient of my clinical impression; the nature and purpose of the treatment or procedure; the risks, benefits, and possible complications of the intervention; the alternatives, including doing nothing; the risk(s) and benefit(s) of the alternative treatment(s) or procedure(s); and the risk(s) and benefit(s) of doing nothing. The patient was provided information about the general risks and possible complications associated with the procedure. These may include, but are not limited to: failure to achieve desired goals, infection, bleeding, organ or nerve damage, allergic reactions, paralysis, and death. In addition, the patient was informed of those risks and complications associated to Spine-related procedures, such as failure to decrease pain; infection (i.e.: Meningitis, epidural or intraspinal abscess); bleeding (i.e.: epidural hematoma, subarachnoid hemorrhage, or any other type of intraspinal or peri-dural bleeding); organ or nerve damage (i.e.: Any type of peripheral nerve, nerve root, or spinal cord injury) with subsequent damage to sensory, motor, and/or autonomic systems, resulting in permanent pain, numbness, and/or weakness of one or several areas of the body; allergic reactions; (i.e.: anaphylactic reaction);  and/or death. Furthermore, the patient was informed of those risks and complications associated with the medications. These include, but are not limited to: allergic reactions (i.e.: anaphylactic or anaphylactoid reaction(s)); adrenal axis suppression; blood sugar elevation that in diabetics may result in ketoacidosis or comma; water retention that in patients with history of congestive heart failure may result in shortness of breath, pulmonary edema, and decompensation with resultant heart failure; weight gain; swelling or edema; medication-induced neural toxicity; particulate matter embolism and blood vessel occlusion with resultant organ, and/or nervous system infarction; and/or aseptic necrosis of one or more joints. Finally, the patient was informed that Medicine is not an exact science; therefore, there is also the possibility of unforeseen or unpredictable risks and/or possible complications that may result in a catastrophic outcome. The patient indicated having understood very clearly. We have given the patient no guarantees and we have made no promises. Enough time was given to the patient to ask questions, all of which were answered to the patient's satisfaction. Wanda [name] has indicated that she wanted to continue with the procedure. Attestation: I, the ordering provider, attest that I have discussed with the patient the benefits, risks, side-effects, alternatives, likelihood of achieving goals, and potential problems during recovery for the procedure that I have provided informed consent.  Date  Time: 04/05/2021 11:32 AM   Prophylactic antibiotics  Anti-infectives (From admission, onward)    None      Indication(s): None identified   Description of procedure   Start Time: 1216 hrs  Local  Anesthesia: Once the patient was positioned, prepped, and time-out was completed. The target area was identified located. The skin was marked with an approved surgical skin marker. Once marked, the  skin (epidermis, dermis, and hypodermis), and deeper tissues (fat, connective tissue and muscle) were infiltrated with a small amount of a short-acting local anesthetic, loaded on a 10cc syringe with a 25G, 1.5-in  Needle. An appropriate amount of time was allowed for local anesthetics to take effect before proceeding to the next step. Local Anesthetic: Lidocaine 1-2% The unused portion of the local anesthetic was discarded in the proper designated containers. Safety Precautions: Aspiration looking for blood return was conducted prior to all injections. At no point did I inject any substances, as a needle was being advanced. Before injecting, the patient was told to immediately notify me if she was experiencing any new onset of "ringing in the ears, or metallic taste in the mouth". No attempts were made at seeking any paresthesias. Safe injection practices and needle disposal techniques used. Medications properly checked for expiration dates. SDV (single dose vial) medications used. After the completion of the procedure, all disposable equipment used was discarded in the proper designated medical waste containers.  Technical description: Protocol guidelines were followed. After positioning, the target area was identified and prepped in the usual manner. Skin & deeper tissues infiltrated with local anesthetic. Appropriate amount of time allowed to pass for local anesthetics to take effect. Proper needle placement secured. Once satisfactory needle placement was confirmed, I proceeded to inject the desired solution in slow, incremental fashion, intermittently assessing for discomfort or any signs of abnormal or undesired spread of substance. Once completed, the needle was removed and disposed of, as per hospital protocols. The area was cleaned, making sure to leave some of the prepping solution back to take advantage of its long term bactericidal properties.  Aspiration:  Negative   Vitals:   04/05/21 1128  04/05/21 1223  BP: (!) 124/91 108/80  Pulse: 84 80  Resp: 16 16  Temp: (!) 97.2 F (36.2 C)   TempSrc: Temporal   SpO2: 100%   Weight: 229 lb (103.9 kg)   Height: '5\' 4"'$  (1.626 m)     End Time:   hrs   Imaging guidance  Imaging-assisted Technique: Fluoroscopy Guidance (Non-spinal) Indication(s): Morbid obesity. Assistance in needle guidance and placement for procedures requiring needle placement in or near specific anatomical locations impossible to access without such assistance. Exposure Time: Please see nurses notes. Contrast: None Fluoroscopic Guidance: I was personally present in the fluoroscopy suite, where the patient was placed in position for the procedure, over the fluoroscopy-compatible table. Fluoroscopy was manipulated, using "Tunnel Vision Technique", to obtain the best possible view of the target area, on the affected side. Parallax error was corrected before commencing the procedure. A "direction-depth-direction" technique was used to introduce the needle under continuous pulsed fluoroscopic guidance. Once the target was reached, antero-posterior, oblique, and lateral fluoroscopic projection views were taken to confirm needle placement in all planes. Electronic images uploaded into EMR. Ultrasound Guidance: N/A      Interpretation: Intraoperative imaging interpretation by performing Physician.  Severe degeneration of left knee medial compartment observed under fluoroscopic guidance.  No MRIs of the knees available.  The patient indicates not remembering having had any MRIs of her knees done.   Post-op assessment  Post-procedure Vital Signs:  Pulse/HCG Rate: 80  Temp: (!) 97.2 F (36.2 C) Resp: 16 BP: 108/80 SpO2: 100 %  EBL: None  Complications: No immediate  post-treatment complications observed by team, or reported by patient.  Note: The patient tolerated the entire procedure well. A repeat set of vitals were taken after the procedure and the patient was kept  under observation following institutional policy, for this type of procedure. Post-procedural neurological assessment was performed, showing return to baseline, prior to discharge. The patient was provided with post-procedure discharge instructions, including a section on how to identify potential problems. Should any problems arise concerning this procedure, the patient was given instructions to immediately contact us, at any time, without hesitation. In any case, we plan to contact the patient by telephone for a follow-up status report regarding this interventional procedure.  Comments:  No additional relevant information.   Plan of care  Chronic Opioid Analgesic:  Hydrocodone/APAP 5/325, 1 tab PO q 8 hrs (15 mg/day of hydrocodone) MME/day: 15 mg/day.   Medications administered: We administered lidocaine, pentafluoroprop-tetrafluoroeth, Hyaluronan, and ropivacaine (PF) 2 mg/mL (0.2%).  Follow-up plan:   Return in about 2 weeks (around 04/19/2021) for Proc-day(T,Th), (VV), (PPE), for review of ordered tests (left knee MRI).      Interventional Therapies  Risk  Complexity Considerations:   NOTE: NO LUMBAR RFA until BMI <35.   Planned  Pending:   Therapeutic/palliative bilateral lumbar facet MBB #R5L3  IM Toradol/Norflex 60/60 mg (12/01/2020)    Under consideration:   Diagnostic left genicular NB #1    Completed:   Therapeutic right lumbar facet MBB x4 (04/01/2020)  Therapeutic left lumbar facet MBB x2 (11/18/2019)  Therapeutic right IA steroid knee injection x1 (08/15/2018)  Therapeutic left IA steroid knee injection x2 (08/15/2018)  Therapeutic left IA Hyalgan knee injection x9 (10/07/2020)    Therapeutic  Palliative (PRN) options:   Palliative left lumbar facet MBB #3  Palliative right lumbar facet MBB #5  Palliative left IA Hyalgan knee injection #10  Palliative left vs bilateral IA knee injection #3 (w/ steroid)         Recent Visits Date Type Provider Dept  02/21/21 Office  Visit Milinda Pointer, MD [facility]  01/06/21 Telemedicine Milinda Pointer, MD [facility]  Showing recent visits within past 90 days and meeting all other requirements Today's Visits Date Type Provider Dept  04/05/21 Procedure visit Milinda Pointer, MD [facility]  Showing today's visits and meeting all other requirements Future Appointments Date Type Provider Dept  04/19/21 Appointment Milinda Pointer, MD [facility]  05/23/21 Appointment Milinda Pointer, MD [facility]  Showing future appointments within next 90 days and meeting all other requirements  Disposition: Discharge home  Discharge (Date  Time): 04/05/2021; 1226 hrs.   Primary Care Physician: Colon Branch, MD Location: [facility] Note by: Gaspar Cola, MD Date: 04/05/2021; Time: 1:08 PM  DISCLAIMER: Medicine is not an Chief Strategy Officer. It has no guarantees or warranties. The decision to proceed with this intervention was based on the information collected from the patient. Conclusions were drawn from the patient's questionnaire, interview, and examination. Because information was provided in large part by the patient, it cannot be guaranteed that it has not been purposely or unconsciously manipulated or altered. Every effort has been made to obtain as much accurate, relevant, available data as possible. Always take into account that the treatment will also be dependent on availability of resources and existing treatment guidelines, considered by other Pain Management Specialists as being common knowledge and practice, at the time of the intervention. It is also important to point out that variation  in procedural techniques and pharmacological choices are the acceptable norm. For Medico-Legal review purposes, the indications, contraindications, technique, and results of the these procedures should only be evaluated, judged and  interpreted by a Board-Certified Interventional Pain Specialist with extensive familiarity and expertise in the same exact procedure and technique.

## 2021-04-05 ENCOUNTER — Other Ambulatory Visit: Payer: Self-pay

## 2021-04-05 ENCOUNTER — Encounter: Payer: Self-pay | Admitting: Pain Medicine

## 2021-04-05 ENCOUNTER — Ambulatory Visit
Admission: RE | Admit: 2021-04-05 | Discharge: 2021-04-05 | Disposition: A | Discharge status: Home / Self Care | Payer: Medicare HMO | Source: Ambulatory Visit | Attending: Pain Medicine | Admitting: Pain Medicine

## 2021-04-05 ENCOUNTER — Ambulatory Visit: Payer: Medicare HMO | Admitting: Pain Medicine

## 2021-04-05 VITALS — BP 108/80 | HR 80 | Temp 97.2°F | Resp 16 | Ht 64.0 in | Wt 229.0 lb

## 2021-04-05 DIAGNOSIS — M545 Low back pain, unspecified: Secondary | ICD-10-CM

## 2021-04-05 DIAGNOSIS — Z79891 Long term (current) use of opiate analgesic: Secondary | ICD-10-CM

## 2021-04-05 DIAGNOSIS — G894 Chronic pain syndrome: Secondary | ICD-10-CM | POA: Diagnosis not present

## 2021-04-05 DIAGNOSIS — M1712 Unilateral primary osteoarthritis, left knee: Secondary | ICD-10-CM | POA: Insufficient documentation

## 2021-04-05 DIAGNOSIS — M76892 Other specified enthesopathies of left lower limb, excluding foot: Secondary | ICD-10-CM

## 2021-04-05 DIAGNOSIS — G8929 Other chronic pain: Secondary | ICD-10-CM

## 2021-04-05 DIAGNOSIS — M174 Other bilateral secondary osteoarthritis of knee: Secondary | ICD-10-CM | POA: Diagnosis not present

## 2021-04-05 DIAGNOSIS — M47816 Spondylosis without myelopathy or radiculopathy, lumbar region: Secondary | ICD-10-CM | POA: Insufficient documentation

## 2021-04-05 DIAGNOSIS — Z6841 Body Mass Index (BMI) 40.0 and over, adult: Secondary | ICD-10-CM | POA: Insufficient documentation

## 2021-04-05 DIAGNOSIS — M25562 Pain in left knee: Secondary | ICD-10-CM | POA: Insufficient documentation

## 2021-04-05 DIAGNOSIS — M5135 Other intervertebral disc degeneration, thoracolumbar region: Secondary | ICD-10-CM

## 2021-04-05 DIAGNOSIS — M4317 Spondylolisthesis, lumbosacral region: Secondary | ICD-10-CM

## 2021-04-05 DIAGNOSIS — Z79899 Other long term (current) drug therapy: Secondary | ICD-10-CM

## 2021-04-05 DIAGNOSIS — M4316 Spondylolisthesis, lumbar region: Secondary | ICD-10-CM | POA: Diagnosis present

## 2021-04-05 MED ORDER — PENTAFLUOROPROP-TETRAFLUOROETH EX AERO
INHALATION_SPRAY | Freq: Once | CUTANEOUS | Status: AC
  Administered 2021-04-05: 30 via TOPICAL
  Filled 2021-04-05: qty 116

## 2021-04-05 MED ORDER — HYDROCODONE-ACETAMINOPHEN 5-325 MG PO TABS: 1.0000 | ORAL_TABLET | Freq: Three times a day (TID) | ORAL | 0 refills | Status: DC | PRN

## 2021-04-05 MED ORDER — ROPIVACAINE HCL 2 MG/ML IJ SOLN
INTRAMUSCULAR | Status: AC
  Filled 2021-04-05: qty 20

## 2021-04-05 MED ORDER — LIDOCAINE HCL (PF) 2 % IJ SOLN
INTRAMUSCULAR | Status: AC
  Filled 2021-04-05: qty 5

## 2021-04-05 MED ORDER — HYALURONAN 88 MG/4ML IX SOSY
4.0000 mL | PREFILLED_SYRINGE | Freq: Once | INTRA_ARTICULAR | Status: AC
  Administered 2021-04-05: 88 mg via INTRA_ARTICULAR
  Filled 2021-04-05: qty 4

## 2021-04-05 MED ORDER — LIDOCAINE HCL 2 % IJ SOLN
20.0000 mL | Freq: Once | INTRAMUSCULAR | Status: AC
  Administered 2021-04-05: 400 mg

## 2021-04-05 MED ORDER — ROPIVACAINE HCL 2 MG/ML IJ SOLN
5.0000 mL | Freq: Once | INTRAMUSCULAR | Status: AC
  Administered 2021-04-05: 5 mL via INTRA_ARTICULAR

## 2021-04-05 NOTE — Patient Instructions (Signed)

## 2021-04-05 NOTE — Progress Notes (Signed)
Nursing Pain Medication Assessment:  Safety precautions to be maintained throughout the outpatient stay will include: orient to surroundings, keep bed in low position, maintain call bell within reach at all times, provide assistance with transfer out of bed and ambulation.  Medication Inspection Compliance: Wanda [name] did not comply with our request to bring her pills to be counted. She was reminded that bringing the medication bottles, even when empty, is a requirement.  Medication: None brought in. Pill/Patch Count: None available to be counted. Bottle Appearance: No container available. Did not bring bottle(s) to appointment. Filled Date: N/A Last Medication intake:  Yesterday

## 2021-04-06 ENCOUNTER — Telehealth: Payer: Self-pay | Admitting: *Deleted

## 2021-04-06 NOTE — Telephone Encounter (Signed)
No problems post procedure. 

## 2021-04-08 ENCOUNTER — Ambulatory Visit: Payer: Medicare HMO | Admitting: Obstetrics and Gynecology

## 2021-04-18 ENCOUNTER — Telehealth: Payer: Self-pay | Admitting: *Deleted

## 2021-04-18 NOTE — Telephone Encounter (Signed)
Attempted to call for pre appointment review of allergies/meds. Messages left at both numbers listed.

## 2021-04-18 NOTE — Progress Notes (Signed)
Patient: Wanda [name]  Service Category: E/M  Provider: Gaspar Cola, MD  DOB: February 15, 1946  DOS: 04/19/2021  Location: Office  MRN: [id number]  Setting: Ambulatory outpatient  Referring Provider: Colon Branch, MD  Type: Established Patient  Specialty: Interventional Pain Management  PCP: Colon Branch, MD  Location: Remote location  Delivery: TeleHealth     Virtual Encounter - Pain Management PROVIDER NOTE: Information contained herein reflects review and annotations entered in association with encounter. Interpretation of such information and data should be left to medically-trained personnel. Information provided to patient can be located elsewhere in the medical record under "Patient Instructions". Document created using STT-dictation technology, any transcriptional errors that may result from process are unintentional.    Contact & Pharmacy Preferred: [phone] Home: [phone] (home) Mobile: [phone] (mobile) E-mail: No e-mail address on record  [id number] [address], [place] [place] [address] Phone: [phone] Fax: [phone]  [facility] [phone] - [address], [address] AT [place] [address] Phone: [phone] Fax: [phone]   Pre-screening  Wanda [name] offered "in-person" vs "virtual" encounter. She indicated preferring virtual for this encounter.   Reason COVID-19*  Social distancing based on CDC and AMA recommendations.   I contacted Wanda [name] on 04/19/2021 via telephone.      I clearly identified myself as Gaspar Cola, MD. I verified that I was speaking with the correct person using two identifiers (Name: CATHRYN GALLERY, and date of birth: 08-Jan-1946).  Consent I sought verbal advanced consent from Wanda [name] for virtual visit interactions. I informed Wanda [name] of possible security and privacy  concerns, risks, and limitations associated with providing "not-in-person" medical evaluation and management services. I also informed Wanda [name] of the availability of "in-person" appointments. Finally, I informed her that there would be a charge for the virtual visit and that she could be  personally, fully or partially, financially responsible for it. Ms. Banes expressed understanding and agreed to proceed.   Historic Elements   Wanda [name] is a 75 y.o. year old, female patient evaluated today after our last contact on 04/05/2021. Wanda [name]  has a past medical history of Allergy, Chronic back pain, Depression, DJD (degenerative joint disease), Hyperlipidemia, Hypertension, Menopause, Musculoskeletal pain (12/07/2015), and TMJ PAIN (05/17/2010). She also  has a past surgical history that includes G3 P3 . Wanda [name] has a current medication list which includes the following prescription(s): atorvastatin, vitamin d, cyclobenzaprine, diclofenac sodium, gabapentin, hydrochlorothiazide, hydrocodone-acetaminophen, [START ON 04/30/2021] hydrocodone-acetaminophen, [START ON 05/30/2021] hydrocodone-acetaminophen, [START ON 06/29/2021] hydrocodone-acetaminophen, [START ON 07/29/2021] hydrocodone-acetaminophen, lidocaine, lisinopril, sertraline, calcium carbonate, and cyclobenzaprine. She  reports that she has never smoked. She has never used smokeless tobacco. She reports that she does not drink alcohol and does not use drugs. Wanda [name] is allergic to simvastatin.   HPI  Today, she is being contacted for a post-procedure assessment.  According to patient she attained 100% relief of the pain for the duration of the local anesthetic followed by an ongoing 50%.  The patient indicates to me that it is not only the relief of the pain, but the fact that she can now do more, has better range of motion, and is now able to sleep at night.  She is very thankful for all of that.  In addition, she also states that the  injection with the Monovisc seems to have provided her  with better relief than what she used to get with the Hyalgan.  At this point she is doing better and functioning better.  The patient was encouraged to give [place] a call if she needs this again.  She understood and accepted.  Post-Procedure Evaluation  Procedure (04/05/2021):  Procedure: Monovisc Knee Injection (first Monovisc injection.  Previously patient had received Hyalgan.) Laterality: Left (-LT) Level: Intra-articular  No.: 1 Series: 3 Purpose: Therapeutic Indications: Knee arthralgia associated to osteoarthritis of the knee    Imaging: None required (CPT-20610) Analgesia: Skin infiltration w/ local anesthetics Sedation: None   NAS-11 score:          Pre-procedure: 2 /10          Post-procedure: 0-No pain/10     1. Chronic knee pain (Left)   2. Enthesopathy of knee region (Left)   3. Osteoarthritis of knee (Left)   4. Tricompartment osteoarthritis of knee (Left)   5. Secondary osteoarthritis of knee (Bilateral)   6. Morbid obesity with body mass index (BMI) of 40.0 to 44.9 in adult [facility])      Anxiolysis: none.  Effectiveness during initial hour after procedure (Ultra-Short Term Relief): 100 %.  Local anesthetic used: Long-acting (4-6 hours) Effectiveness: Defined as any analgesic benefit obtained secondary to the administration of local anesthetics. This carries significant diagnostic value as to the etiological location, or anatomical origin, of the pain. Duration of benefit is expected to coincide with the duration of the local anesthetic used.  Effectiveness during initial 4-6 hours after procedure (Short-Term Relief): 100 %.  Long-term benefit: Defined as any relief past the pharmacologic duration of the local anesthetics.  Effectiveness past the initial 6 hours after procedure (Long-Term Relief): 50 % (ongoing).  Benefits, current: Defined as benefit present at the time of this evaluation.   Analgesia: The patient  indicates having attained at least an ongoing 50% relief of her left knee pain.  She indicates that she is able to sleep better, to more, and has better range of motion. Function: Wanda [name] reports improvement in function ROM: Wanda [name] reports improvement in ROM  Pharmacotherapy Assessment   Analgesic: Hydrocodone/APAP 5/325, 1 tab PO q 8 hrs (15 mg/day of hydrocodone) MME/day: 15 mg/day.   Monitoring:  PMP: PDMP reviewed during this encounter.       Pharmacotherapy: No side-effects or adverse reactions reported. Compliance: No problems identified. Effectiveness: Clinically acceptable. Plan: Refer to "POC". UDS:  Summary  Date Value Ref Range Status  02/21/2021 Note  Final    Comment:    ==================================================================== [employer] Select 13 (MW) ==================================================================== Test                             Result       Flag       Units  Drug Present and Declared for Prescription Verification   Hydrocodone                    687          EXPECTED   ng/mg creat   Hydromorphone                  127          EXPECTED   ng/mg creat   Dihydrocodeine                 90           EXPECTED  ng/mg creat   Norhydrocodone                 1359         EXPECTED   ng/mg creat    Sources of hydrocodone include scheduled prescription medications.    Hydromorphone, dihydrocodeine and norhydrocodone are expected    metabolites of hydrocodone. Hydromorphone and dihydrocodeine are    also available as scheduled prescription medications.  ==================================================================== Test                      Result    Flag   Units      Ref Range   Creatinine              103              mg/dL      >=20 ==================================================================== Declared Medications:  The flagging and interpretation on this report are based on the  following declared medications.   Unexpected results may arise from  inaccuracies in the declared medications.   **Note: The testing scope of this panel includes these medications:   Hydrocodone (Norco)   **Note: The testing scope of this panel does not include the  following reported medications:   Acetaminophen (Norco)  Aspirin  Atorvastatin (Lipitor)  Calcium  Cyclobenzaprine (Flexeril)  Gabapentin (Neurontin)  Hydrochlorothiazide (Hydrodiuril)  Lisinopril (Zestril)  Sertraline (Zoloft)  Vitamin D  Vitamin D2 (Drisdol) ==================================================================== For clinical consultation, please call [phone]. ====================================================================      Laboratory Chemistry Profile   Renal Lab Results  Component Value Date   BUN 10 03/01/2021   CREATININE 0.67 32/35/5732   BCR NOT APPLICABLE 20/25/4270   GFR 87.15 03/01/2021   GFRAA 103 02/13/2019   GFRNONAA 89 02/13/2019    Hepatic Lab Results  Component Value Date   AST 19 03/01/2021   ALT 16 03/01/2021   ALBUMIN 4.1 03/01/2021   ALKPHOS 76 03/01/2021   HCVAB NEGATIVE 08/30/2016    Electrolytes Lab Results  Component Value Date   NA 136 03/01/2021   K 3.9 03/01/2021   CL 100 03/01/2021   CALCIUM 9.8 03/01/2021   MG 1.8 02/13/2019    Bone Lab Results  Component Value Date   VD25OH 25.57 (L) 03/01/2021   VD125OH2TOT 68 12/24/2017   WC3762GB1 68 12/24/2017   VD2125OH2 <8 12/24/2017   25OHVITD1 22 (L) 02/13/2019   25OHVITD2 3.4 02/13/2019   25OHVITD3 19 02/13/2019    Inflammation (CRP: Acute Phase) (ESR: Chronic Phase) Lab Results  Component Value Date   CRP 8 02/13/2019   ESRSEDRATE 22 01/16/2020         Note: Above Lab results reviewed.  Imaging  DG PAIN CLINIC C-ARM 1-60 MIN NO REPORT Fluoro was used, but no Radiologist interpretation will be provided.  Please refer to "NOTES" tab for provider progress note.  Assessment  The primary encounter diagnosis  was Chronic knee pain (Left). Diagnoses of Enthesopathy of knee region (Left), Osteoarthritis of knee (Left), Tricompartment osteoarthritis of knee (Left), Secondary osteoarthritis of knee (Bilateral), and Morbid obesity with body mass index (BMI) of 40.0 to 44.9 in adult [facility]) were also pertinent to this visit.  Plan of Care  Problem-specific:  No problem-specific Assessment & Plan notes found for this encounter.  Wanda [name] has a current medication list which includes the following long-term medication(s): atorvastatin, cyclobenzaprine, gabapentin, hydrochlorothiazide, hydrocodone-acetaminophen, [START ON 04/30/2021] hydrocodone-acetaminophen, [START ON 05/30/2021] hydrocodone-acetaminophen, [START ON 06/29/2021] hydrocodone-acetaminophen, [START ON 07/29/2021] hydrocodone-acetaminophen,  lisinopril, sertraline, calcium carbonate, and cyclobenzaprine.  Pharmacotherapy (Medications Ordered): No orders of the defined types were placed in this encounter.  Orders:  No orders of the defined types were placed in this encounter.  Follow-up plan:   No follow-ups on file.     Interventional Therapies  Risk  Complexity Considerations:   NOTE: NO LUMBAR RFA until BMI <35.   Planned  Pending:   Therapeutic/palliative bilateral lumbar facet MBB #R5L3  IM Toradol/Norflex 60/60 mg (12/01/2020)    Under consideration:   Diagnostic left genicular NB #1    Completed:   Therapeutic right lumbar facet MBB x4 (04/01/2020)  Therapeutic left lumbar facet MBB x2 (11/18/2019)  Therapeutic right IA steroid knee injection x1 (08/15/2018)  Therapeutic left IA steroid knee injection x2 (08/15/2018)  Therapeutic left IA Hyalgan knee injection x9 (10/07/2020)    Therapeutic  Palliative (PRN) options:   Palliative left lumbar facet MBB #3  Palliative right lumbar facet MBB #5  Palliative left IA Hyalgan knee injection #10  Palliative left vs bilateral IA knee injection #3 (w/ steroid)          Recent  Visits Date Type Provider Dept  04/05/21 Procedure visit Milinda Pointer, MD [facility]  02/21/21 Office Visit Milinda Pointer, MD [facility]  Showing recent visits within past 90 days and meeting all other requirements Today's Visits Date Type Provider Dept  04/19/21 Office Visit Milinda Pointer, MD [facility]  Showing today's visits and meeting all other requirements Future Appointments Date Type Provider Dept  05/23/21 Appointment Milinda Pointer, MD [facility]  Showing future appointments within next 90 days and meeting all other requirements I discussed the assessment and treatment plan with the patient. The patient was provided an opportunity to ask questions and all were answered. The patient agreed with the plan and demonstrated an understanding of the instructions.  Patient advised to call back or seek an in-person evaluation if the symptoms or condition worsens.  Duration of encounter: 15 minutes.  Note by: Gaspar Cola, MD Date: 04/19/2021; Time: 5:53 PM

## 2021-04-19 ENCOUNTER — Other Ambulatory Visit: Payer: Self-pay

## 2021-04-19 ENCOUNTER — Ambulatory Visit (HOSPITAL_BASED_OUTPATIENT_CLINIC_OR_DEPARTMENT_OTHER): Payer: Medicare HMO | Admitting: Pain Medicine

## 2021-04-19 DIAGNOSIS — M25562 Pain in left knee: Secondary | ICD-10-CM | POA: Diagnosis not present

## 2021-04-19 DIAGNOSIS — M76892 Other specified enthesopathies of left lower limb, excluding foot: Secondary | ICD-10-CM

## 2021-04-19 DIAGNOSIS — M1712 Unilateral primary osteoarthritis, left knee: Secondary | ICD-10-CM

## 2021-04-19 DIAGNOSIS — M174 Other bilateral secondary osteoarthritis of knee: Secondary | ICD-10-CM | POA: Diagnosis not present

## 2021-04-19 DIAGNOSIS — Z6841 Body Mass Index (BMI) 40.0 and over, adult: Secondary | ICD-10-CM | POA: Diagnosis not present

## 2021-04-19 DIAGNOSIS — G8929 Other chronic pain: Secondary | ICD-10-CM

## 2021-04-20 ENCOUNTER — Ambulatory Visit
Admission: RE | Admit: 2021-04-20 | Discharge: 2021-04-20 | Disposition: A | Discharge status: Home / Self Care | Payer: Medicare HMO | Source: Ambulatory Visit | Attending: Pain Medicine | Admitting: Pain Medicine

## 2021-04-20 ENCOUNTER — Other Ambulatory Visit: Payer: Self-pay

## 2021-04-20 DIAGNOSIS — M76892 Other specified enthesopathies of left lower limb, excluding foot: Secondary | ICD-10-CM

## 2021-04-20 DIAGNOSIS — Z6841 Body Mass Index (BMI) 40.0 and over, adult: Secondary | ICD-10-CM | POA: Insufficient documentation

## 2021-04-20 DIAGNOSIS — M174 Other bilateral secondary osteoarthritis of knee: Secondary | ICD-10-CM

## 2021-04-20 DIAGNOSIS — S83242A Other tear of medial meniscus, current injury, left knee, initial encounter: Secondary | ICD-10-CM | POA: Diagnosis not present

## 2021-04-20 DIAGNOSIS — M25562 Pain in left knee: Secondary | ICD-10-CM | POA: Diagnosis not present

## 2021-04-20 DIAGNOSIS — M1712 Unilateral primary osteoarthritis, left knee: Secondary | ICD-10-CM | POA: Diagnosis not present

## 2021-04-20 DIAGNOSIS — S83282A Other tear of lateral meniscus, current injury, left knee, initial encounter: Secondary | ICD-10-CM | POA: Diagnosis not present

## 2021-04-20 DIAGNOSIS — G8929 Other chronic pain: Secondary | ICD-10-CM | POA: Diagnosis not present

## 2021-05-02 ENCOUNTER — Telehealth: Payer: Self-pay | Admitting: Internal Medicine

## 2021-05-02 DIAGNOSIS — M792 Neuralgia and neuritis, unspecified: Secondary | ICD-10-CM

## 2021-05-02 DIAGNOSIS — M797 Fibromyalgia: Secondary | ICD-10-CM

## 2021-05-02 NOTE — Telephone Encounter (Signed)
Patient states that Dr. Larose Kells agreed to start refill her gabspentin prescription instead of her pain management MD.    Medication: gabapentin (NEURONTIN) 300 MG capsule  Has the patient contacted their pharmacy? No. (If no, request that the patient contact the pharmacy for the refill.) (If yes, when and what did the pharmacy advise?)  Preferred Pharmacy (with phone number or street name): [facility] [phone] [place], [place] [phone] [address] AT [facility] [address] [place]  [address] [place] [address]  Phone:  [phone]  Fax:  [phone]  Agent: Please be advised that RX refills may take up to 3 business days. We ask that you follow-up with your pharmacy.

## 2021-05-02 NOTE — Telephone Encounter (Signed)
Please advise 

## 2021-05-03 MED ORDER — GABAPENTIN 300 MG PO CAPS
300.0000 mg | ORAL_CAPSULE | Freq: Three times a day (TID) | ORAL | 1 refills | Status: DC
Start: 2021-05-03 — End: 2021-08-30

## 2021-05-03 NOTE — Telephone Encounter (Signed)
Rx sent 

## 2021-05-03 NOTE — Telephone Encounter (Signed)
Yes, I agreed to refill gabapentin.  Send a 106-month supply.

## 2021-05-22 NOTE — Progress Notes (Deleted)
Has the flu and also has enough medications.

## 2021-05-23 ENCOUNTER — Encounter: Payer: Medicare HMO | Admitting: Pain Medicine

## 2021-06-02 ENCOUNTER — Other Ambulatory Visit: Payer: Self-pay | Admitting: Internal Medicine

## 2021-06-02 ENCOUNTER — Encounter: Payer: Self-pay | Admitting: Internal Medicine

## 2021-06-29 ENCOUNTER — Telehealth: Payer: Self-pay | Admitting: Internal Medicine

## 2021-06-29 NOTE — Telephone Encounter (Signed)
Form received, placed in PCP red folder to be signed.

## 2021-06-29 NOTE — Telephone Encounter (Signed)
Patient came and dropped off placard form for paz to fill out Placed into paz bin up front Patient would like to be called when the paper is ready for pick up

## 2021-07-04 NOTE — Telephone Encounter (Signed)
LMOM informing Pt that form is ready for pick up at front desk. Copy sent for scanning.

## 2021-08-23 NOTE — Progress Notes (Deleted)
No show

## 2021-08-24 ENCOUNTER — Encounter: Payer: Medicare HMO | Admitting: Pain Medicine

## 2021-08-30 ENCOUNTER — Encounter: Payer: Self-pay | Admitting: Internal Medicine

## 2021-08-30 ENCOUNTER — Ambulatory Visit (INDEPENDENT_AMBULATORY_CARE_PROVIDER_SITE_OTHER): Payer: Medicare HMO | Admitting: Internal Medicine

## 2021-08-30 VITALS — BP 126/74 | HR 71 | Temp 98.4°F | Resp 18 | Ht 65.0 in | Wt 232.5 lb

## 2021-08-30 DIAGNOSIS — F99 Mental disorder, not otherwise specified: Secondary | ICD-10-CM

## 2021-08-30 DIAGNOSIS — M792 Neuralgia and neuritis, unspecified: Secondary | ICD-10-CM

## 2021-08-30 DIAGNOSIS — G5600 Carpal tunnel syndrome, unspecified upper limb: Secondary | ICD-10-CM | POA: Diagnosis not present

## 2021-08-30 DIAGNOSIS — I10 Essential (primary) hypertension: Secondary | ICD-10-CM | POA: Diagnosis not present

## 2021-08-30 DIAGNOSIS — E559 Vitamin D deficiency, unspecified: Secondary | ICD-10-CM | POA: Diagnosis not present

## 2021-08-30 DIAGNOSIS — E785 Hyperlipidemia, unspecified: Secondary | ICD-10-CM

## 2021-08-30 DIAGNOSIS — F5105 Insomnia due to other mental disorder: Secondary | ICD-10-CM

## 2021-08-30 DIAGNOSIS — M797 Fibromyalgia: Secondary | ICD-10-CM

## 2021-08-30 LAB — COMPREHENSIVE METABOLIC PANEL
ALT: 19 U/L (ref 0–35)
AST: 22 U/L (ref 0–37)
Albumin: 4.2 g/dL (ref 3.5–5.2)
Alkaline Phosphatase: 77 U/L (ref 39–117)
BUN: 11 mg/dL (ref 6–23)
CO2: 31 mEq/L (ref 19–32)
Calcium: 9.8 mg/dL (ref 8.4–10.5)
Chloride: 100 mEq/L (ref 96–112)
Creatinine, Ser: 0.67 mg/dL (ref 0.40–1.20)
GFR: 86.85 mL/min (ref >60.00)
Glucose, Bld: 85 mg/dL (ref 70–99)
Potassium: 4.4 mEq/L (ref 3.5–5.1)
Sodium: 136 mEq/L (ref 135–145)
Total Bilirubin: 0.4 mg/dL (ref 0.2–1.2)
Total Protein: 7 g/dL (ref 6.0–8.3)

## 2021-08-30 LAB — LIPID PANEL
Cholesterol: 203 mg/dL — ABNORMAL HIGH (ref 0–200)
HDL: 50 mg/dL (ref >39.00)
LDL Cholesterol: 142 mg/dL — ABNORMAL HIGH (ref 0–99)
NonHDL: 153.36
Total CHOL/HDL Ratio: 4
Triglycerides: 57 mg/dL (ref 0.0–149.0)
VLDL: 11.4 mg/dL (ref 0.0–40.0)

## 2021-08-30 LAB — VITAMIN D 25 HYDROXY (VIT D DEFICIENCY, FRACTURES): VITD: 23.14 ng/mL — ABNORMAL LOW (ref 30.00–100.00)

## 2021-08-30 LAB — TSH: TSH: 1.51 u[IU]/mL (ref 0.35–5.50)

## 2021-08-30 MED ORDER — GABAPENTIN 300 MG PO CAPS: 300.0000 mg | ORAL_CAPSULE | Freq: Three times a day (TID) | ORAL | 1 refills | Status: DC

## 2021-08-30 MED ORDER — HYDROCHLOROTHIAZIDE 25 MG PO TABS: 25.0000 mg | ORAL_TABLET | Freq: Every day | ORAL | 1 refills | Status: DC

## 2021-08-30 MED ORDER — SERTRALINE HCL 100 MG PO TABS: 200.0000 mg | ORAL_TABLET | Freq: Every day | ORAL | 1 refills | Status: DC

## 2021-08-30 MED ORDER — LISINOPRIL 10 MG PO TABS: ORAL_TABLET | ORAL | 1 refills | Status: DC

## 2021-08-30 MED ORDER — ATORVASTATIN CALCIUM 40 MG PO TABS: 40.0000 mg | ORAL_TABLET | Freq: Every day | ORAL | 1 refills | Status: DC

## 2021-08-30 NOTE — Progress Notes (Signed)
Subjective:    Patient ID: Wanda [name], female    DOB: [birth year], 76 y.o.   MRN: [id number]  DOS:  08/30/2021 Type of visit - description: ROV  Today I addressed the following problems: Hypertension, high cholesterol, vitamin D deficiency.  Also several months history of numbness at the left hand, mostly at the cubital side. Denies any neck pain per se.  Does have some left shoulder pain.  Depression not completely well controlled, trigger of stress and concerns is the patient's  husband health.  Has chronic insomnia, slightly worse lately. Denies any suicidal ideas.   Review of Systems See above   Past Medical History:  Diagnosis Date   Allergy    RHINITIS   Chronic back pain    Depression    DJD (degenerative joint disease)    back pain , on disability   Hyperlipidemia    Hypertension    Menopause    onset age 49    Musculoskeletal pain 12/07/2015   TMJ PAIN 05/17/2010   Qualifier: Diagnosis of  By: Larose Kells MD, [place]     Past Surgical History:  Procedure Laterality Date   G3 P3       Current Outpatient Medications  Medication Instructions   atorvastatin (LIPITOR) 40 mg, Oral, Daily   calcium carbonate (CALCIUM 600) 600 mg, Oral, 2 times daily with meals   cyclobenzaprine (FLEXERIL) 10 mg, Oral, Daily at bedtime   cyclobenzaprine (FLEXERIL) 5 mg, Oral, 3 times daily PRN   diclofenac Sodium (VOLTAREN) 2 g, Topical, 4 times daily   gabapentin (NEURONTIN) 300 mg, Oral, 3 times daily   hydrochlorothiazide (HYDRODIURIL) 25 mg, Oral, Daily   HYDROcodone-acetaminophen (NORCO/VICODIN) 5-325 MG tablet 1 tablet, Oral, 3 times daily PRN, Must last 30 days   lidocaine (LIDODERM) 5 % 1 patch, Transdermal, Every 24 hours, Remove & Discard patch within 12 hours or as directed by MD   lisinopril (ZESTRIL) 10 MG tablet TAKE 1 TABLET(10 MG) BY MOUTH DAILY   sertraline (ZOLOFT) 150 mg, Oral, Daily   Vitamin D 2,000 Units, Oral, Daily       Objective:   Physical  Exam BP 126/74 (BP Location: Left Arm, Patient Position: Sitting, Cuff Size: Normal)    Pulse 71    Temp 98.4 F (36.9 C) (Oral)    Resp 18    Ht 5\' 5"  (1.651 m)    Wt 232 lb 8 oz (105.5 kg)    SpO2 97%    BMI 38.69 kg/m  General:   Well developed, NAD, BMI noted. HEENT:  Normocephalic . Face symmetric, atraumatic Neck: Slightly TTP at the distal cervical spine.  Range of motion normal Lungs:  CTA B Normal respiratory effort, no intercostal retractions, no accessory muscle use. Heart: RRR,  no murmur.  MSK: Shoulder symmetric, range of motion with + mild pain on the left. Wrists: No synovitis, + Tinel's test on the left. Lower extremities: no pretibial edema bilaterally  Skin: Not pale. Not jaundice Neurologic:  alert & oriented X3.  Speech normal, gait appropriate for age and unassisted Psych--  Cognition and judgment appear intact.  Cooperative with normal attention span and concentration.  Behavior appropriate. No anxious or depressed appearing.      Assessment     Assessment HTN Hyperlipidemia Depression Morbid obesity MSK: DJD, chronic back-knee pain, hydrocodone /gabapentin rx by DR Shelly Rubenstein D  Def  PLAN:  HTN: Seems well controlled, RF hctz-zestril. Check  cmp Hyperlipidemia: reports good compliance w/ lipitor,  labs Depression-anxiery: needs better control, increase zoloft from 150 mg qd to 200 mg qd  Insomnia: will try to get better depression/ anxiety controlled by increasing Zoloft and will rec melatonin Vitamin D deficiency: Takes supplements irregularly.  Labs CTS: new issue. L hand symptoms consistent with CTS, recommend consistent use of splint particularly at night.  Check a TSH.  Call if not better RTC 6 months.  Sooner if needed    This visit occurred during the SARS-CoV-2 public health emergency.  Safety protocols were in place, including screening questions prior to the visit, additional usage of staff PPE, and extensive cleaning of exam room  while observing appropriate contact time as indicated for disinfecting solutions.

## 2021-08-30 NOTE — Patient Instructions (Addendum)
Increase sertraline to 100 mg tablets: 2 a day  Okay to take over-the-counter melatonin 4 to 8 mg every night.  Carpal tunnel syndrome: For the numbness on your left hand, get a over-the-counter carpal tunnel syndrome splinter and uses it as much as she can particularly at night. If you get worse to me know.  Take over-the-counter vitamin D 2000 units every day.     GO TO THE LAB : Get the blood work     GO TO THE FRONT DESK, [place] Come back for a physical exam in 6 months   Carpal Tunnel Syndrome Carpal tunnel syndrome is a condition that causes pain, weakness, and numbness in your hand and arm. Numbness is when you cannot feel an area in your body. The carpal tunnel is a narrow area that is on the palm side of your wrist. Repeated wrist motion or certain diseases may cause swelling in the tunnel. This swelling can pinch the main nerve in the wrist. This nerve is called the median nerve. What are the causes? This condition may be caused by: Moving your hand and wrist over and over again while doing a task. Injury to the wrist. Arthritis. A sac of fluid (cyst) or abnormal growth (tumor) in the carpal tunnel. Fluid buildup during pregnancy. Use of tools that vibrate. Sometimes the cause is not known. What increases the risk? The following factors may make you more likely to have this condition: Having a job that makes you do these things: Move your hand over and over again. Work with tools that vibrate, such as drills or sanders. Being a woman. Having diabetes, obesity, thyroid problems, or kidney failure. What are the signs or symptoms? Symptoms of this condition include: A tingling feeling in your fingers. Tingling or loss of feeling in your hand. Pain in your entire arm. This pain may get worse when you bend your wrist and elbow for a long time. Pain in your wrist that goes up your arm to your shoulder. Pain that goes down into your palm or  fingers. Weakness in your hands. You may find it hard to grab and hold items. You may feel worse at night. How is this treated? This condition may be treated with: Lifestyle changes. You will be asked to stop or change the activity that caused your problem. Doing exercises and activities that make bones, muscles, and tendons stronger (physical therapy). Learning how to use your hand again (occupational therapy). Medicines for pain and swelling. You may have injections in your wrist. A wrist splint or brace. Surgery. Follow these instructions at home: If you have a splint or brace: Wear the splint or brace as told by your doctor. Take it off only as told by your doctor. Loosen the splint if your fingers: Tingle. Become numb. Turn cold and blue. Keep the splint or brace clean. If the splint or brace is not waterproof: Do not let it get wet. Cover it with a watertight covering when you take a bath or a shower. Managing pain, stiffness, and swelling If told, put ice on the painful area: If you have a removable splint or brace, remove it as told by your doctor. Put ice in a plastic bag. Place a towel between your skin and the bag. Leave the ice on for 20 minutes, 2-3 times per day. Do not fall asleep with the cold pack on your skin. Take off the ice if your skin turns bright red. This is very important. If  you cannot feel pain, heat, or cold, you have a greater risk of damage to the area. Move your fingers often to reduce stiffness and swelling. General instructions Take over-the-counter and prescription medicines only as told by your doctor. Rest your wrist from any activity that may cause pain. If needed, talk with your boss at work about changes that can help your wrist heal. Do exercises as told by your doctor, physical therapist, or occupational therapist. Keep all follow-up visits. Contact a doctor if: You have new symptoms. Medicine does not help your pain. Your symptoms get  worse. Get help right away if: You have very bad numbness or tingling in your wrist or hand. Summary Carpal tunnel syndrome is a condition that causes pain in your hand and arm. It is often caused by repeated wrist motions. Lifestyle changes and medicines are used to treat this problem. Surgery may help in very bad cases. Follow your doctor's instructions about wearing a splint, resting your wrist, keeping follow-up visits, and calling for help. This information is not intended to replace advice given to you by your health care provider. Make sure you discuss any questions you have with your health care provider. Document Revised: 11/20/2019 Document Reviewed: 11/20/2019 Elsevier Patient Education  [employer].

## 2021-08-31 NOTE — Assessment & Plan Note (Signed)
HTN: Seems well controlled, RF hctz-zestril. Check  cmp Hyperlipidemia: reports good compliance w/ lipitor, labs Depression-anxiery: needs better control, increase zoloft from 150 mg qd to 200 mg qd  Insomnia: will try to get better depression/ anxiety controlled by increasing Zoloft and will rec melatonin Vitamin D deficiency: Takes supplements irregularly.  Labs CTS: new issue. L hand symptoms consistent with CTS, recommend consistent use of splint particularly at night.  Check a TSH.  Call if not better RTC 6 months.  Sooner if needed

## 2021-09-01 MED ORDER — VITAMIN D (ERGOCALCIFEROL) 1.25 MG (50000 UNIT) PO CAPS: 50000.0000 [IU] | ORAL_CAPSULE | ORAL | 0 refills | Status: DC

## 2021-09-01 NOTE — Addendum Note (Signed)
Addended byDamita Dunnings D on: 09/01/2021 02:23 PM   Modules accepted: Orders

## 2021-09-19 NOTE — Progress Notes (Signed)
PROVIDER NOTE: Information contained herein reflects review and annotations entered in association with encounter. Interpretation of such information and data should be left to medically-trained personnel. Information provided to patient can be located elsewhere in the medical record under "Patient Instructions". Document created using STT-dictation technology, any transcriptional errors that may result from process are unintentional.    Patient: Wanda [name]  Service Category: E/M  Provider: Gaspar Cola, MD  DOB: 28-May-1946  DOS: 09/21/2021  Specialty: Interventional Pain Management  MRN: [id number]  Setting: Ambulatory outpatient  PCP: Colon Branch, MD  Type: Established Patient    Referring Provider: Colon Branch, MD  Location: Office  Delivery: Face-to-face     HPI  Wanda [name], a 76 y.o. year old female, is here today because of her Chronic pain syndrome [G89.4]. Ms. Gudgel primary complain today is Back Pain (lower) Last encounter: My last encounter with her was on 08/24/2021. Pertinent problems: Ms. 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 with possible L3 nerve root compression); Chronic sacroiliac joint pain (Right); Chronic radicular pain of lower extremity (Left) (S1 dermatome); Osteoarthritis of hips (Bilateral); Chronic knee pain (1ry area of Pain) (Bilateral) (L>R); Osteoarthritis of knee (Bilateral); Chronic hip pain (Bilateral) (L>R); Chronic low back pain (Bilateral) (L>R) w/ sciatica (Left); Chronic musculoskeletal pain; Abnormal MRI, lumbar spine (03/20/2018); Spondylosis without myelopathy or radiculopathy, lumbosacral region; DDD (degenerative disc disease), lumbosacral; Chronic low back pain (Bilateral) (R>L)  w/o sciatica; Chronic knee pain (Left); Chronic hip pain (Right); Fibromyalgia; Tricompartment osteoarthritis of knee (Left); Osteoarthritis of knee (Left); Arthropathy of knee (Left); Secondary osteoarthritis of knee (Bilateral); Enthesopathy of knee region (Left); Numbness and tingling of upper extremity (Left); Cervical radiculopathy at C8 (Left); Cervical radiculopathy at C7 (Left); DDD (degenerative disc disease), thoracolumbar; Chronic sacroiliac joint arthropathy (Bilateral); Degenerative joint disease of sacroiliac joint (Bilateral) (Newell); Osteoarthritis of sacroiliac joints (Bilateral) (Snyder); Somatic dysfunction of sacroiliac joints (Bilateral); Lumbosacral facet arthropathy (Multilevel) (Bilateral); Grade 1 Anterolisthesis of lumbosacral spine (4-5 mm) (L5/S1); Grade 1  Anterolisthesis of lumbar spine (2-3 mm) (L4/L5); and Lumbosacral facet hypertrophy (Multilevel) (Bilateral) on their pertinent problem list. Pain Assessment: Severity of Chronic pain is reported as a 5 /10. Location: Back Left/pain radiaities down her left leg to her foot. Onset: 1 to 4 weeks ago. Quality: Aching, Burning, Constant, Throbbing, Nagging. Timing: Constant. Modifying factor(s): Meds, heating and laying down. Vitals:  height is $RemoveB'5\' 5"'moKEdBMc$  (1.651 m) and weight is 232 lb (105.2 kg). Her temperature is 97.2 F (36.2 C) (abnormal). Her blood pressure is 135/82 and her pulse is 86. Her oxygen saturation is 100%.   Reason for encounter: medication management. The patient indicates doing well with the current medication regimen. No adverse reactions or side effects reported to the medications.   Last medication management evaluation on 04/05/2021.  3 prescriptions written.  PMP confirms 3 prescriptions filled.  Last 30-day prescription filled on 08/06/2021.  Patient should have ran out of medication on 09/05/2021 (16 days ago).  Today the patient indicates not being able to find her empty bottle (required for pill count).  Today  the patient indicates having a flareup of her low back pain.  She describes the pain to be bilateral with referred pain through her buttocks and down the back of the leg to about the level of  the knee.  She is still having problems with her left knee and today we will be sending a referral to orthopedic surgery for evaluation and possible total knee replacement.  RTCB: 10/21/2021 Nonopioids transferred 05/03/2020: Flexeril, Neurontin, and calcium  Pharmacotherapy Assessment  Analgesic: Hydrocodone/APAP 5/325, 1 tab PO q 8 hrs (15 mg/day of hydrocodone) MME/day: 15 mg/day.   Monitoring: [place] PMP: PDMP reviewed during this encounter.       Pharmacotherapy: No side-effects or adverse reactions reported. Compliance: No problems identified. Effectiveness: Clinically acceptable.  Chauncey Fischer, RN  09/21/2021  3:29 PM  Sign when Signing Visit Nursing Pain Medication Assessment:  Safety precautions to be maintained throughout the outpatient stay will include: orient to surroundings, keep bed in low position, maintain call bell within reach at all times, provide assistance with transfer out of bed and ambulation.  Medication Inspection Compliance: Ms. Erxleben did not comply with our request to bring her pills to be counted. She was reminded that bringing the medication bottles, even when empty, is a requirement.  Medication: None brought in. Pill/Patch Count: None available to be counted. Bottle Appearance: No container available. Did not bring bottle(s) to appointment. Filled Date: N/A Last Medication intake:  YesterdaySafety precautions to be maintained throughout the outpatient stay will include: orient to surroundings, keep bed in low position, maintain call bell within reach at all times, provide assistance with transfer out of bed and ambulation   She think her daughter throw away her empty bottle..     UDS:  Summary  Date Value Ref Range Status  02/21/2021 Note  Final    Comment:     ==================================================================== [employer] [facility] (MW) ==================================================================== Test                             Result       Flag       Units  Drug Present and Declared for Prescription Verification   Hydrocodone                    687          EXPECTED   ng/mg creat   Hydromorphone                  127          EXPECTED   ng/mg creat   Dihydrocodeine                 90           EXPECTED   ng/mg creat   Norhydrocodone                 1359         EXPECTED   ng/mg creat    Sources of hydrocodone include scheduled prescription medications.    Hydromorphone, dihydrocodeine and norhydrocodone are expected    metabolites of hydrocodone. Hydromorphone and dihydrocodeine are    also available as scheduled prescription medications.  ==================================================================== Test                      Result    Flag   Units      Ref Range   Creatinine              103              mg/dL      >=20 ==================================================================== Declared Medications:  The flagging  and interpretation on this report are based on the  following declared medications.  Unexpected results may arise from  inaccuracies in the declared medications.   **Note: The testing scope of this panel includes these medications:   Hydrocodone (Norco)   **Note: The testing scope of this panel does not include the  following reported medications:   Acetaminophen (Norco)  Aspirin  Atorvastatin (Lipitor)  Calcium  Cyclobenzaprine (Flexeril)  Gabapentin (Neurontin)  Hydrochlorothiazide (Hydrodiuril)  Lisinopril (Zestril)  Sertraline (Zoloft)  Vitamin D  Vitamin D2 (Drisdol) ==================================================================== For clinical consultation, please call [phone]. ====================================================================      ROS   Constitutional: Denies any fever or chills Gastrointestinal: No reported hemesis, hematochezia, vomiting, or acute GI distress Musculoskeletal: Denies any acute onset joint swelling, redness, loss of ROM, or weakness Neurological: No reported episodes of acute onset apraxia, aphasia, dysarthria, agnosia, amnesia, paralysis, loss of coordination, or loss of consciousness  Medication Review  HYDROcodone-acetaminophen, Vitamin D, Vitamin D (Ergocalciferol), atorvastatin, calcium carbonate, cyclobenzaprine, diclofenac Sodium, gabapentin, hydrochlorothiazide, lidocaine, lisinopril, and sertraline  History Review  Allergy: Ms. Adriance is allergic to simvastatin. Drug: Ms. Incorvaia  reports no history of drug use. Alcohol:  reports no history of alcohol use. Tobacco:  reports that she has never smoked. She has never used smokeless tobacco. Social: Ms. Cremeans  reports that she has never smoked. She has never used smokeless tobacco. She reports that she does not drink alcohol and does not use drugs. Medical:  has a past medical history of Allergy, Chronic back pain, Depression, DJD (degenerative joint disease), Hyperlipidemia, Hypertension, Menopause, Musculoskeletal pain (12/07/2015), and TMJ PAIN (05/17/2010). Surgical: Ms. Formisano  has a past surgical history that includes G3 P3 . Family: family history includes Breast cancer in her maternal aunt and sister; Cancer in her brother and sister; Colon cancer in her brother; Diabetes in an other family member; Kidney cancer in her son.  Laboratory Chemistry Profile   Renal Lab Results  Component Value Date   BUN 11 08/30/2021   CREATININE 0.67 [birth year]1950   BCR NOT APPLICABLE 93/26/7124   GFR 86.85 08/30/2021   GFRAA 103 02/13/2019   GFRNONAA 89 02/13/2019    Hepatic Lab Results  Component Value Date   AST 22 08/30/2021   ALT 19 08/30/2021   ALBUMIN 4.2 08/30/2021   ALKPHOS 77 08/30/2021   HCVAB NEGATIVE 08/30/2016    Electrolytes Lab  Results  Component Value Date   NA 136 08/30/2021   K 4.4 08/30/2021   CL 100 08/30/2021   CALCIUM 9.8 08/30/2021   MG 1.8 02/13/2019    Bone Lab Results  Component Value Date   VD25OH 23.14 (L) 08/30/2021   VD125OH2TOT 68 12/24/2017   [id number] 68 12/24/2017   VD2125OH2 <8 12/24/2017   25OHVITD1 22 (L) 02/13/2019   25OHVITD2 3.4 02/13/2019   25OHVITD3 19 02/13/2019    Inflammation (CRP: Acute Phase) (ESR: Chronic Phase) Lab Results  Component Value Date   CRP 8 02/13/2019   ESRSEDRATE 22 01/16/2020         Note: Above Lab results reviewed.  Recent Imaging Review  MR KNEE LEFT WO CONTRAST CLINICAL DATA:  Knee pain. Progressive worsening. Ongoing for 8 years  EXAM: MRI OF THE LEFT KNEE WITHOUT CONTRAST  TECHNIQUE: Multiplanar, multisequence MR imaging of the knee was performed. No intravenous contrast was administered.  COMPARISON:  None.  FINDINGS: MENISCI  Medial: Maceration of the body and posterior horn of the medial meniscus. Maceration of the anterior horn-body junction of  the medial meniscus.  Lateral: Tiny radial tear of the free edge of the body of the lateral meniscus.  LIGAMENTS  Cruciates: Complete chronic ACL tear.  Intact PCL.  Collaterals: Medial collateral ligament is intact. Lateral collateral ligament complex is intact.  CARTILAGE  Patellofemoral: High-grade partial-thickness cartilage loss of the patellofemoral compartment with areas of full-thickness cartilage loss of the lateral patellar facet with subchondral reactive marrow changes.  Medial: Extensive full-thickness cartilage loss of the medial femorotibial compartment with subchondral reactive marrow edema and cystic changes.  Lateral: Partial-thickness cartilage loss of the lateral femorotibial compartment with small areas of full-thickness cartilage loss along the medial aspect of the lateral tibial plateau.  JOINT: No joint effusion. Normal Hoffa's fat-pad. No  plical thickening. Two loose bodies posterior to the lateral femoral condyle.  POPLITEAL FOSSA: Popliteus tendon is intact. No Baker's cyst.  EXTENSOR MECHANISM: Intact quadriceps tendon. Intact patellar tendon. Intact lateral patellar retinaculum. Intact medial patellar retinaculum. Intact MPFL.  BONES: No aggressive osseous lesion. No fracture or dislocation.  Other: No fluid collection or hematoma. Muscles are normal. Small ganglion cyst arising from the proximal tibiofibular joint.  IMPRESSION: 1. Maceration of the body and posterior horn of the medial meniscus. Maceration of the anterior horn-body junction of the medial meniscus. 2. Tiny radial tear of the free edge of the body of the lateral meniscus. 3.  Tricompartmental cartilage abnormalities as described above.  Electronically Signed   By: Kathreen Devoid M.D.   On: 04/22/2021 07:04 Note: Reviewed        Physical Exam  General appearance: Well nourished, well developed, and well hydrated. In no apparent acute distress Mental status: Alert, oriented x 3 (person, place, & time)       Respiratory: No evidence of acute respiratory distress Eyes: PERLA Vitals: BP 135/82    Pulse 86    Temp (!) 97.2 F (36.2 C)    Ht _0  (1.651 m)    Wt 232 lb (105.2 kg)    SpO2 100%    BMI 38.61 kg/m  BMI: Estimated body mass index is 38.61 kg/m as calculated from the following:   Height as of this encounter: _1  (1.651 m).   Weight as of this encounter: 232 lb (105.2 kg). Ideal: Ideal body weight: 57 kg (125 lb 10.6 oz) Adjusted ideal body weight: 76.3 kg (168 lb 3.2 oz)  Assessment   Status Diagnosis  Controlled Controlled Controlled 1. Chronic pain syndrome   2. Chronic low back pain (Bilateral) (R>L) w/o sciatica   3. Lumbar facet syndrome (Bilateral) (R>L)   4. DDD (degenerative disc disease), thoracolumbar   5. Grade 1 Anterolisthesis of lumbosacral spine (4 mm) (L5/S1)   6. Grade 1  Anterolisthesis of lumbar spine  (2-3 mm) (L4/L5)   7. Pharmacologic therapy   8. Chronic use of opiate for therapeutic purpose   9. Encounter for medication management   10. Encounter for chronic pain management   11. Arthropathy of knee (Left)   12. Chronic knee pain (Left)   13. Osteoarthritis of knee (Left)      Updated Problems: No problems updated.  Plan of Care  Problem-specific:  No problem-specific Assessment & Plan notes found for this encounter.  Ms. JALYIAH SHELLEY has a current medication list which includes the following long-term medication(s): atorvastatin, calcium carbonate, cyclobenzaprine, gabapentin, hydrochlorothiazide, lisinopril, sertraline, and hydrocodone-acetaminophen.  Pharmacotherapy (Medications Ordered): Meds ordered this encounter  Medications   DISCONTD: HYDROcodone-acetaminophen (NORCO/VICODIN) 5-325 MG tablet  Sig: Take 1 tablet by mouth 3 (three) times daily as needed for severe pain. Must last 30 days    Dispense:  90 tablet    Refill:  0    DO NOT: delete (not duplicate); no partial-fill (will deny script to complete), no refill request (F/U required). DISPENSE: 1 day early if closed on fill date. WARN: No CNS-depressants within 8 hrs of med.   HYDROcodone-acetaminophen (NORCO/VICODIN) 5-325 MG tablet    Sig: Take 1 tablet by mouth 3 (three) times daily as needed for severe pain. Must last 30 days    Dispense:  90 tablet    Refill:  0    DO NOT: delete (not duplicate); no partial-fill (will deny script to complete), no refill request (F/U required). DISPENSE: 1 day early if closed on fill date. WARN: No CNS-depressants within 8 hrs of med.   Orders:  Orders Placed This Encounter  Procedures   LUMBAR FACET(MEDIAL BRANCH NERVE BLOCK) MBNB    Standing Status:   Future    Standing Expiration Date:   12/22/2021    Scheduling Instructions:     Procedure: Lumbar facet block (AKA.: Lumbosacral medial branch nerve block)     Side: Bilateral     Level: L3-4 & L5-S1 Facets (L2,  L3, L4, L5, & S1 Medial Branch Nerves)     Sedation: Patient's choice.     Timeframe: ASAA    Order Specific Question:   Where will this procedure be performed?    Answer:   [facility]   Ambulatory referral to Orthopedic Surgery    Referral Priority:   Routine    Referral Type:   Surgical    Referral Reason:   Specialty Services Required    Requested Specialty:   Orthopedic Surgery    Number of Visits Requested:   1   Follow-up plan:   Return for (Clinic) procedure: (B) L-FCT Blk.     Interventional Therapies  Risk   Complexity Considerations:   Estimated body mass index is 38.61 kg/m as calculated from the following:   Height as of this encounter: $RemoveBeforeD'5\' 5"'[id number]$  (1.651 m).   Weight as of this encounter: 232 lb (105.2 kg). Risk   Complexity Considerations:   NOTE: NO LUMBAR RFA until BMI <35.   Planned   Pending:      Under consideration:   Diagnostic left genicular NB #1    Completed:   Palliative IM Toradol/Norflex 60/60 mg (12/01/2020)  Therapeutic right lumbar facet MBB x5 (12/23/2020) (100/100/50/50)  Therapeutic left lumbar facet MBB x3 (12/23/2020) (100/100/75/>75)  Therapeutic right IA steroid knee injection x1 (08/15/2018) (N/A - NP)  Therapeutic left IA steroid knee injection x2 (08/15/2018) (N/A - NP)  Therapeutic left IA Hyalgan knee injection x9 (10/07/2020) (100/100/0/0)  Therapeutic left IA Monovisc knee injection x1 (04/05/2021) (100/100/50/50)    Therapeutic   Palliative (PRN) options:   Therapeutic/palliative lumbar facet MBB      Recent Visits No visits were found meeting these conditions. Showing recent visits within past 90 days and meeting all other requirements Today's Visits Date Type Provider Dept  09/21/21 Office Visit Milinda Pointer, MD [facility]  Showing today's visits and meeting all other requirements Future Appointments Date Type Provider Dept  10/17/21 Appointment Milinda Pointer, MD [facility]  Showing  future appointments within next 90 days and meeting all other requirements  I discussed the assessment and treatment plan with the patient. The patient was provided an opportunity to ask questions and  all were answered. The patient agreed with the plan and demonstrated an understanding of the instructions.  Patient advised to call back or seek an in-person evaluation if the symptoms or condition worsens.  Duration of encounter: 35 minutes.  Note by: Gaspar Cola, MD Date: 09/21/2021; Time: 4:02 PM

## 2021-09-21 ENCOUNTER — Other Ambulatory Visit: Payer: Self-pay

## 2021-09-21 ENCOUNTER — Ambulatory Visit: Payer: Medicare HMO | Attending: Pain Medicine | Admitting: Pain Medicine

## 2021-09-21 ENCOUNTER — Encounter: Payer: Self-pay | Admitting: Pain Medicine

## 2021-09-21 VITALS — BP 135/82 | HR 86 | Temp 97.2°F | Ht 65.0 in | Wt 232.0 lb

## 2021-09-21 DIAGNOSIS — M1712 Unilateral primary osteoarthritis, left knee: Secondary | ICD-10-CM

## 2021-09-21 DIAGNOSIS — M4316 Spondylolisthesis, lumbar region: Secondary | ICD-10-CM

## 2021-09-21 DIAGNOSIS — G8929 Other chronic pain: Secondary | ICD-10-CM | POA: Diagnosis not present

## 2021-09-21 DIAGNOSIS — M545 Low back pain, unspecified: Secondary | ICD-10-CM | POA: Insufficient documentation

## 2021-09-21 DIAGNOSIS — Z79899 Other long term (current) drug therapy: Secondary | ICD-10-CM

## 2021-09-21 DIAGNOSIS — G894 Chronic pain syndrome: Secondary | ICD-10-CM | POA: Diagnosis not present

## 2021-09-21 DIAGNOSIS — Z79891 Long term (current) use of opiate analgesic: Secondary | ICD-10-CM

## 2021-09-21 DIAGNOSIS — M5135 Other intervertebral disc degeneration, thoracolumbar region: Secondary | ICD-10-CM

## 2021-09-21 DIAGNOSIS — M4317 Spondylolisthesis, lumbosacral region: Secondary | ICD-10-CM | POA: Diagnosis not present

## 2021-09-21 DIAGNOSIS — M25562 Pain in left knee: Secondary | ICD-10-CM | POA: Diagnosis present

## 2021-09-21 DIAGNOSIS — M47816 Spondylosis without myelopathy or radiculopathy, lumbar region: Secondary | ICD-10-CM | POA: Diagnosis not present

## 2021-09-21 MED ORDER — HYDROCODONE-ACETAMINOPHEN 5-325 MG PO TABS: 1.0000 | ORAL_TABLET | Freq: Three times a day (TID) | ORAL | 0 refills | Status: DC | PRN

## 2021-09-21 NOTE — Progress Notes (Signed)
Nursing Pain Medication Assessment:  ?Safety precautions to be maintained throughout the outpatient stay will include: orient to surroundings, keep bed in low position, maintain call bell within reach at all times, provide assistance with transfer out of bed and ambulation.  ?Medication Inspection Compliance: Ms. Maciejewski did not comply with our request to bring her pills to be counted. She was reminded that bringing the medication bottles, even when empty, is a requirement. ? ?Medication: None brought in. ?Pill/Patch Count: None available to be counted. ?Bottle Appearance: No container available. Did not bring bottle(s) to appointment. ?Filled Date: N/A ?Last Medication intake:  YesterdaySafety precautions to be maintained throughout the outpatient stay will include: orient to surroundings, keep bed in low position, maintain call bell within reach at all times, provide assistance with transfer out of bed and ambulation ? ? ?She think her daughter throw away her empty bottle.[name]  ?

## 2021-09-21 NOTE — Progress Notes (Signed)
Called and cancelled today's Hydrocodone script at  ?[facility] due to unable to fill due to shortage. ?

## 2021-09-21 NOTE — Patient Instructions (Addendum)
____________________________________________________________________________________________ ° °Medication Rules ° °Purpose: To inform patients, and their family members, of our rules and regulations. ° °Applies to: All patients receiving prescriptions (written or electronic). ° °Pharmacy of record: Pharmacy where electronic prescriptions will be sent. If written prescriptions are taken to a different pharmacy, please inform the nursing staff. The pharmacy listed in the electronic medical record should be the one where you would like electronic prescriptions to be sent. ° °Electronic prescriptions: In compliance with the [place] [employer]reng[employer] Opioid Misuse Prevention (STOP) Act of 2017 (Session Law 2017-74/H243), effective July 24, 2018, all controlled substances must be electronically prescribed. Calling prescriptions to the pharmacy will cease to exist. ° °Prescription refills: Only during scheduled appointments. Applies to all prescriptions. ° °NOTE: The following applies primarily to controlled substances (Opioid* Pain Medications).  ° °Type of encounter (visit): For patients receiving controlled substances, face-to-face visits are required. (Not an option or up to the patient.) ° °Patient's responsibilities: °Pain Pills: Bring all pain pills to every appointment (except for procedure appointments). °Pill Bottles: Bring pills in original pharmacy bottle. Always bring the newest bottle. Bring bottle, even if empty. °Medication refills: You are responsible for knowing and keeping track of what medications you take and those you need refilled. °The day before your appointment: write a list of all prescriptions that need to be refilled. °The day of the appointment: give the list to the admitting nurse. Prescriptions will be written only during appointments. No prescriptions will be written on procedure days. °If you forget a medication: it will not be "Called in", "Faxed", or "electronically sent". You will  need to get another appointment to get these prescribed. °No early refills. Do not call asking to have your prescription filled early. °Prescription Accuracy: You are responsible for carefully inspecting your prescriptions before leaving our office. Have the discharge nurse carefully go over each prescription with you, before taking them home. Make sure that your name is accurately spelled, that your address is correct. Check the name and dose of your medication to make sure it is accurate. Check the number of pills, and the written instructions to make sure they are clear and accurate. Make sure that you are given enough medication to last until your next medication refill appointment. °Taking Medication: Take medication as prescribed. When it comes to controlled substances, taking less pills or less frequently than prescribed is permitted and encouraged. °Never take more pills than instructed. °Never take medication more frequently than prescribed.  °Inform other Doctors: Always inform, all of your healthcare providers, of all the medications you take. °Pain Medication from other Providers: You are not allowed to accept any additional pain medication from any other Doctor or Healthcare provider. There are two exceptions to this rule. (see below) In the event that you require additional pain medication, you are responsible for notifying us, as stated below. °Cough Medicine: Often these contain an opioid, such as codeine or hydrocodone. Never accept or take cough medicine containing these opioids if you are already taking an opioid* medication. The combination may cause respiratory failure and death. °Medication Agreement: You are responsible for carefully reading and following our Medication Agreement. This must be signed before receiving any prescriptions from our practice. Safely store a copy of your signed Agreement. Violations to the Agreement will result in no further prescriptions. (Additional copies of our  Medication Agreement are available upon request.) °Laws, Rules, & Regulations: All patients are expected to follow all Federal and State Laws, Statutes, Rules, & Regulations. Ignorance of   the Laws does not constitute a valid excuse.  °Illegal drugs and Controlled Substances: The use of illegal substances (including, but not limited to marijuana and its derivatives) and/or the illegal use of any controlled substances is strictly prohibited. Violation of this rule may result in the immediate and permanent discontinuation of any and all prescriptions being written by our practice. The use of any illegal substances is prohibited. °Adopted CDC guidelines & recommendations: Target dosing levels will be at or below 60 MME/day. Use of benzodiazepines** is not recommended. ° °Exceptions: There are only two exceptions to the rule of not receiving pain medications from other Healthcare Providers. °Exception #1 (Emergencies): In the event of an emergency (i.e.: accident requiring emergency care), you are allowed to receive additional pain medication. However, you are responsible for: As soon as you are able, call our office [phone], at any time of the day or night, and leave a message stating your name, the date and nature of the emergency, and the name and dose of the medication prescribed. In the event that your call is answered by a member of our staff, make sure to document and save the date, time, and the name of the person that took your information.  °Exception #2 (Planned Surgery): In the event that you are scheduled by another doctor or dentist to have any type of surgery or procedure, you are allowed (for a period no longer than 30 days), to receive additional pain medication, for the acute post-op pain. However, in this case, you are responsible for picking up a copy of our "Post-op Pain Management for Surgeons" handout, and giving it to your surgeon or dentist. This document is available at our office, and  does not require an appointment to obtain it. Simply go to our office during business hours (Monday-Thursday from 8:00 AM to 4:00 PM) (Friday 8:00 AM to 12:00 Noon) or if you have a scheduled appointment with us, prior to your surgery, and ask for it by name. In addition, you are responsible for: calling our office [phone], at any time of the day or night, and leaving a message stating your name, name of your surgeon, type of surgery, and date of procedure or surgery. Failure to comply with your responsibilities may result in termination of therapy involving the controlled substances. °Medication Agreement Violation. Following the above rules, including your responsibilities will help you in avoiding a Medication Agreement Violation (“Breaking your Pain Medication Contract”). ° °*Opioid medications include: morphine, codeine, oxycodone, oxymorphone, hydrocodone, hydromorphone, meperidine, tramadol, tapentadol, buprenorphine, fentanyl, methadone. °**Benzodiazepine medications include: diazepam (Valium), alprazolam (Xanax), clonazepam (Klonopine), lorazepam (Ativan), clorazepate (Tranxene), chlordiazepoxide (Librium), estazolam (Prosom), oxazepam (Serax), temazepam (Restoril), triazolam (Halcion) °(Last updated: 04/20/2021) °____________________________________________________________________________________________ ° ____________________________________________________________________________________________ ° °Medication Recommendations and Reminders ° °Applies to: All patients receiving prescriptions (written and/or electronic). ° °Medication Rules & Regulations: These rules and regulations exist for your safety and that of others. They are not flexible and neither are we. Dismissing or ignoring them will be considered "non-compliance" with medication therapy, resulting in complete and irreversible termination of such therapy. (See document titled "Medication Rules" for more details.) In all conscience,  because of safety reasons, we cannot continue providing a therapy where the patient does not follow instructions. ° °Pharmacy of record:  °Definition: This is the pharmacy where your electronic prescriptions will be sent.  °We do not endorse any particular pharmacy, however, we have experienced problems with [facility] not securing enough medication supply for the community. °We do not restrict you   in your choice of pharmacy. However, once we write for your prescriptions, we will NOT be re-sending more prescriptions to fix restricted supply problems created by your pharmacy, or your insurance.  °The pharmacy listed in the electronic medical record should be the one where you want electronic prescriptions to be sent. °If you choose to change pharmacy, simply notify our nursing staff. ° °Recommendations: °Keep all of your pain medications in a safe place, under lock and key, even if you live alone. We will NOT replace lost, stolen, or damaged medication. °After you fill your prescription, take 1 week's worth of pills and put them away in a safe place. You should keep a separate, properly labeled bottle for this purpose. The remainder should be kept in the original bottle. Use this as your primary supply, until it runs out. Once it's gone, then you know that you have 1 week's worth of medicine, and it is time to come in for a prescription refill. If you do this correctly, it is unlikely that you will ever run out of medicine. °To make sure that the above recommendation works, it is very important that you make sure your medication refill appointments are scheduled at least 1 week before you run out of medicine. To do this in an effective manner, make sure that you do not leave the office without scheduling your next medication management appointment. Always ask the nursing staff to show you in your prescription , when your medication will be running out. Then arrange for the receptionist to get you a return appointment,  at least 7 days before you run out of medicine. Do not wait until you have 1 or 2 pills left, to come in. This is very poor planning and does not take into consideration that we may need to cancel appointments due to bad weather, sickness, or emergencies affecting our staff. °DO NOT ACCEPT A "Partial Fill": If for any reason your pharmacy does not have enough pills/tablets to completely fill or refill your prescription, do not allow for a "partial fill". The law allows the pharmacy to complete that prescription within 72 hours, without requiring a new prescription. If they do not fill the rest of your prescription within those 72 hours, you will need a separate prescription to fill the remaining amount, which we will NOT provide. If the reason for the partial fill is your insurance, you will need to talk to the pharmacist about payment alternatives for the remaining tablets, but again, DO NOT ACCEPT A PARTIAL FILL, unless you can trust your pharmacist to obtain the remainder of the pills within 72 hours. ° °Prescription refills and/or changes in medication(s):  °Prescription refills, and/or changes in dose or medication, will be conducted only during scheduled medication management appointments. (Applies to both, written and electronic prescriptions.) °No refills on procedure days. No medication will be changed or started on procedure days. No changes, adjustments, and/or refills will be conducted on a procedure day. Doing so will interfere with the diagnostic portion of the procedure. °No phone refills. No medications will be "called into the pharmacy". °No Fax refills. °No weekend refills. °No Holliday refills. °No after hours refills. ° °Remember:  °Business hours are:  °Monday to Thursday 8:00 AM to 4:00 PM °Provider's Schedule: °Francisco Naveira, MD - Appointments are:  °Medication management: Monday and Wednesday 8:00 AM to 4:00 PM °Procedure day: Tuesday and Thursday 7:30 AM to 4:00 PM °Bilal Lateef, MD -  Appointments are:  °Medication management: Tuesday and Thursday 8:00   AM to 4:00 PM °Procedure day: Monday and Wednesday 7:30 AM to 4:00 PM °(Last update: 02/11/2020) °____________________________________________________________________________________________ ° ____________________________________________________________________________________________ ° °CBD (cannabidiol) & Delta-8 (Delta-8 tetrahydrocannabinol) WARNING ° °Intro: Cannabidiol (CBD) and tetrahydrocannabinol (THC), are two natural compounds found in plants of the Cannabis genus. They can both be extracted from hemp or cannabis. Hemp and cannabis come from the Cannabis sativa plant. Both compounds interact with your body’s endocannabinoid system, but they have very different effects. CBD does not produce the high sensation associated with cannabis. Delta-8 tetrahydrocannabinol, also known as delta-8 THC, is a psychoactive substance found in the Cannabis sativa plant, of which marijuana and hemp are two varieties. THC is responsible for the high associated with the illicit use of marijuana. ° °Applicable to: All individuals currently taking or considering taking CBD (cannabidiol) and, more important, all patients taking opioid analgesic controlled substances (pain medication). (Example: oxycodone; oxymorphone; hydrocodone; hydromorphone; morphine; methadone; tramadol; tapentadol; fentanyl; buprenorphine; butorphanol; dextromethorphan; meperidine; codeine; etc.) ° °Legal status: CBD remains a Schedule I drug prohibited for any use. CBD is illegal with one exception. In the [place], CBD has a limited Food and Drug Administration (FDA) approval for the treatment of two specific types of epilepsy disorders. Only one CBD product has been approved by the FDA for this purpose: "Epidiolex". FDA is aware that some companies are marketing products containing cannabis and cannabis-derived compounds in ways that violate the Federal Food, Drug and Cosmetic Act  (FD&C Act) and that may put the health and safety of consumers at risk. The FDA, a Federal agency, has not enforced the CBD status since 2018. UPDATE: (09/09/2021) The Drug Enforcement Agency (DEA) issued a letter stating that "delta" cannabinoids, including Delta-8-THCO and Delta-9-THCO, synthetically derived from hemp do not qualify as hemp and will be viewed as Schedule I drugs. (Schedule I drugs, substances, or chemicals are defined as drugs with no currently accepted medical use and a high potential for abuse. Some examples of Schedule I drugs are: heroin, lysergic acid diethylamide (LSD), marijuana (cannabis), 3,4-methylenedioxymethamphetamine (ecstasy), methaqualone, and peyote.) (https://www.dea.gov) ° °Legality: Some manufacturers ship CBD products nationally, which is illegal. Often such products are sold online and are therefore available throughout the country. CBD is openly sold in head shops and health food stores in some states where such sales have not been explicitly legalized. Selling unapproved products with unsubstantiated therapeutic claims is not only a violation of the law, but also can put patients at risk, as these products have not been proven to be safe or effective. Federal illegality makes it difficult to conduct research on CBD. ° °Reference: "FDA Regulation of Cannabis and Cannabis-Derived Products, Including Cannabidiol (CBD)" - https://www.fda.gov/news-events/public-health-focus/fda-regulation-cannabis-and-cannabis-derived-products-including-cannabidiol-cbd ° °Warning: CBD is not FDA approved and has not undergo the same manufacturing controls as prescription drugs.  This means that the purity and safety of available CBD may be questionable. Most of the time, despite manufacturer's claims, it is contaminated with THC (delta-9-tetrahydrocannabinol - the chemical in marijuana responsible for the "HIGH").  When this is the case, the THC contaminant will trigger a positive urine drug  screen (UDS) test for Marijuana (carboxy-THC). Because a positive UDS for any illicit substance is a violation of our medication agreement, your opioid analgesics (pain medicine) may be permanently discontinued. °The FDA recently put out a warning about 5 things that everyone should be aware of regarding Delta-8 THC: °Delta-8 THC products have not been evaluated or approved by the FDA for safe use and may be marketed in ways that put the   public health at risk. °The FDA has received adverse event reports involving delta-8 THC-containing products. °Delta-8 THC has psychoactive and intoxicating effects. °Delta-8 THC manufacturing often involve use of potentially harmful chemicals to create the concentrations of delta-8 THC claimed in the marketplace. The final delta-8 THC product may have potentially harmful by-products (contaminants) due to the chemicals used in the process. Manufacturing of delta-8 THC products may occur in uncontrolled or unsanitary settings, which may lead to the presence of unsafe contaminants or other potentially harmful substances. °Delta-8 THC products should be kept out of the reach of children and pets. ° °MORE ABOUT CBD ° °General Information: CBD was discovered in 1940 and it is a derivative of the cannabis sativa genus plants (Marijuana and Hemp). It is one of the 113 identified substances found in Marijuana. It accounts for up to 40% of the plant's extract. As of 2018, preliminary clinical studies on CBD included research for the treatment of anxiety, movement disorders, and pain. CBD is available and consumed in multiple forms, including inhalation of smoke or vapor, as an aerosol spray, and by mouth. It may be supplied as an oil containing CBD, capsules, dried cannabis, or as a liquid solution. CBD is thought not to be as psychoactive as THC (delta-9-tetrahydrocannabinol - the chemical in marijuana responsible for the "HIGH"). Studies suggest that CBD may interact with different  biological target receptors in the body, including cannabinoid and other neurotransmitter receptors. As of 2018 the mechanism of action for its biological effects has not been determined. ° °Side-effects   Adverse reactions: Dry mouth, diarrhea, decreased appetite, fatigue, drowsiness, malaise, weakness, sleep disturbances, and others. ° °Drug interactions: CBC may interact with other medications such as blood-thinners. Because CBD causes drowsiness on its own, it also increases the drowsiness caused by other medications, including antihistamines (such as Benadryl), benzodiazepines (Xanax, Ativan, Valium), antipsychotics, antidepressants and opioids, as well as alcohol and supplements such as kava, melatonin and St. John's Wort. Be cautious with the following combinations:  ° °Brivaracetam (Briviact) °Brivaracetam is changed and broken down by the body. CBD might decrease how quickly the body breaks down brivaracetam. This might increase levels of brivaracetam in the body. ° °Caffeine °Caffeine is changed and broken down by the body. CBD might decrease how quickly the body breaks down caffeine. This might increase levels of caffeine in the body. ° °Carbamazepine (Tegretol) °Carbamazepine is changed and broken down by the body. CBD might decrease how quickly the body breaks down carbamazepine. This might increase levels of carbamazepine in the body and increase its side effects. ° °Citalopram (Celexa) °Citalopram is changed and broken down by the body. CBD might decrease how quickly the body breaks down citalopram. This might increase levels of citalopram in the body and increase its side effects. ° °Clobazam (Onfi) °Clobazam is changed and broken down by the liver. CBD might decrease how quickly the liver breaks down clobazam. This might increase the effects and side effects of clobazam. ° °Eslicarbazepine (Aptiom) °Eslicarbazepine is changed and broken down by the body. CBD might decrease how quickly the body  breaks down eslicarbazepine. This might increase levels of eslicarbazepine in the body by a small amount. ° °Everolimus (Zostress) °Everolimus is changed and broken down by the body. CBD might decrease how quickly the body breaks down everolimus. This might increase levels of everolimus in the body. ° °Lithium °Taking higher doses of CBD might increase levels of lithium. This can increase the risk of lithium toxicity. ° °Medications changed by the   liver (Cytochrome P450 1A1 (CYP1A1) substrates) °Some medications are changed and broken down by the liver. CBD might change how quickly the liver breaks down these medications. This could change the effects and side effects of these medications. ° °Medications changed by the liver (Cytochrome P450 1A2 (CYP1A2) substrates) °Some medications are changed and broken down by the liver. CBD might change how quickly the liver breaks down these medications. This could change the effects and side effects of these medications. ° °Medications changed by the liver (Cytochrome P450 1B1 (CYP1B1) substrates) °Some medications are changed and broken down by the liver. CBD might change how quickly the liver breaks down these medications. This could change the effects and side effects of these medications. ° °Medications changed by the liver (Cytochrome P450 2A6 (CYP2A6) substrates) °Some medications are changed and broken down by the liver. CBD might change how quickly the liver breaks down these medications. This could change the effects and side effects of these medications. ° °Medications changed by the liver (Cytochrome P450 2B6 (CYP2B6) substrates) °Some medications are changed and broken down by the liver. CBD might change how quickly the liver breaks down these medications. This could change the effects and side effects of these medications. ° °Medications changed by the liver (Cytochrome P450 2C19 (CYP2C19) substrates) °Some medications are changed and broken down by the liver.  CBD might change how quickly the liver breaks down these medications. This could change the effects and side effects of these medications. ° °Medications changed by the liver (Cytochrome P450 2C8 (CYP2C8) substrates) °Some medications are changed and broken down by the liver. CBD might change how quickly the liver breaks down these medications. This could change the effects and side effects of these medications. ° °Medications changed by the liver (Cytochrome P450 2C9 (CYP2C9) substrates) °Some medications are changed and broken down by the liver. CBD might change how quickly the liver breaks down these medications. This could change the effects and side effects of these medications. ° °Medications changed by the liver (Cytochrome P450 2D6 (CYP2D6) substrates) °Some medications are changed and broken down by the liver. CBD might change how quickly the liver breaks down these medications. This could change the effects and side effects of these medications. ° °Medications changed by the liver (Cytochrome P450 2E1 (CYP2E1) substrates) °Some medications are changed and broken down by the liver. CBD might change how quickly the liver breaks down these medications. This could change the effects and side effects of these medications. ° °Medications changed by the liver (Cytochrome P450 3A4 (CYP3A4) substrates) °Some medications are changed and broken down by the liver. CBD might change how quickly the liver breaks down these medications. This could change the effects and side effects of these medications. ° °Medications changed by the liver (Glucuronidated drugs) °Some medications are changed and broken down by the liver. CBD might change how quickly the liver breaks down these medications. This could change the effects and side effects of these medications. ° °Medications that decrease the breakdown of other medications by the liver (Cytochrome P450 2C19 (CYP2C19) inhibitors) °CBD is changed and broken down by the liver.  Some drugs decrease how quickly the liver changes and breaks down CBD. This could change the effects and side effects of CBD. ° °Medications that decrease the breakdown of other medications in the liver (Cytochrome P450 3A4 (CYP3A4) inhibitors) °CBD is changed and broken down by the liver. Some drugs decrease how quickly the liver changes and breaks down CBD. This could change the   effects and side effects of CBD. ° °Medications that increase breakdown of other medications by the liver (Cytochrome P450 3A4 (CYP3A4) inducers) °CBD is changed and broken down by the liver. Some drugs increase how quickly the liver changes and breaks down CBD. This could change the effects and side effects of CBD. ° °Medications that increase the breakdown of other medications by the liver (Cytochrome P450 2C19 (CYP2C19) inducers) °CBD is changed and broken down by the liver. Some drugs increase how quickly the liver changes and breaks down CBD. This could change the effects and side effects of CBD. ° °Methadone (Dolophine) °Methadone is broken down by the liver. CBD might decrease how quickly the liver breaks down methadone. Taking cannabidiol along with methadone might increase the effects and side effects of methadone. ° °Rufinamide (Banzel) °Rufinamide is changed and broken down by the body. CBD might decrease how quickly the body breaks down rufinamide. This might increase levels of rufinamide in the body by a small amount. ° °Sedative medications (CNS depressants) °CBD might cause sleepiness and slowed breathing. Some medications, called sedatives, can also cause sleepiness and slowed breathing. Taking CBD with sedative medications might cause breathing problems and/or too much sleepiness. ° °Sirolimus (Rapamune) °Sirolimus is changed and broken down by the body. CBD might decrease how quickly the body breaks down sirolimus. This might increase levels of sirolimus in the body. ° °Stiripentol (Diacomit) °Stiripentol is changed and  broken down by the body. CBD might decrease how quickly the body breaks down stiripentol. This might increase levels of stiripentol in the body and increase its side effects. ° °Tacrolimus (Prograf) °Tacrolimus is changed and broken down by the body. CBD might decrease how quickly the body breaks down tacrolimus. This might increase levels of tacrolimus in the body. ° °Tamoxifen (Soltamox) °Tamoxifen is changed and broken down by the body. CBD might affect how quickly the body breaks down tamoxifen. This might affect levels of tamoxifen in the body. ° °Topiramate (Topamax) °Topiramate is changed and broken down by the body. CBD might decrease how quickly the body breaks down topiramate. This might increase levels of topiramate in the body by a small amount. ° °Valproate °Valproic acid can cause liver injury. Taking cannabidiol with valproic acid might increase the chance of liver injury. CBD and/or valproic acid might need to be stopped, or the dose might need to be reduced. ° °Warfarin (Coumadin) °CBD might increase levels of warfarin, which can increase the risk for bleeding. CBD and/or warfarin might need to be stopped, or the dose might need to be reduced. ° °Zonisamide °Zonisamide is changed and broken down by the body. CBD might decrease how quickly the body breaks down zonisamide. This might increase levels of zonisamide in the body by a small amount. °(Last update: 09/21/2021) °____________________________________________________________________________________________ ° ____________________________________________________________________________________________ ° °Drug Holidays (Slow) ° °What is a "Drug Holiday"? °Drug Holiday: is the name given to the period of time during which a patient stops taking a medication(s) for the purpose of eliminating tolerance to the drug. ° °Benefits °Improved effectiveness of opioids. °Decreased opioid dose needed to achieve benefits. °Improved pain with lesser  dose. ° °What is tolerance? °Tolerance: is the progressive decreased in effectiveness of a drug due to its repetitive use. With repetitive use, the body gets use to the medication and as a consequence, it loses its effectiveness. This is a common problem seen with opioid pain medications. As a result, a larger dose of the drug is needed to achieve the same effect   that used to be obtained with a smaller dose.  How long should a "Drug Holiday" last? You should stay off of the pain medicine for at least 14 consecutive days. (2 weeks)  Should I stop the medicine "cold [place]"? No. You should always coordinate with your Pain Specialist so that he/she can provide you with the correct medication dose to make the transition as smoothly as possible.  How do I stop the medicine? Slowly. You will be instructed to decrease the daily amount of pills that you take by one (1) pill every seven (7) days. This is called a "slow downward taper" of your dose. For example: if you normally take four (4) pills per day, you will be asked to drop this dose to three (3) pills per day for seven (7) days, then to two (2) pills per day for seven (7) days, then to one (1) per day for seven (7) days, and at the end of those last seven (7) days, this is when the "Drug Holiday" would start.   Will I have withdrawals? By doing a "slow downward taper" like this one, it is unlikely that you will experience any significant withdrawal symptoms. Typically, what triggers withdrawals is the sudden stop of a high dose opioid therapy. Withdrawals can usually be avoided by slowly decreasing the dose over a prolonged period of time. If you do not follow these instructions and decide to stop your medication abruptly, withdrawals may be possible.  What are withdrawals? Withdrawals: refers to the wide range of symptoms that occur after stopping or dramatically reducing opiate drugs after heavy and prolonged use. Withdrawal symptoms do not occur to  patients that use low dose opioids, or those who take the medication sporadically. Contrary to benzodiazepine (example: Valium, Xanax, etc.) or alcohol withdrawals (Delirium Tremens), opioid withdrawals are not lethal. Withdrawals are the physical manifestation of the body getting rid of the excess receptors.  Expected Symptoms Early symptoms of withdrawal may include: Agitation Anxiety Muscle aches Increased tearing Insomnia Runny nose Sweating Yawning  Late symptoms of withdrawal may include: Abdominal cramping Diarrhea Dilated pupils Goose bumps Nausea Vomiting  Will I experience withdrawals? Due to the slow nature of the taper, it is very unlikely that you will experience any.  What is a slow taper? Taper: refers to the gradual decrease in dose.  (Last update: 02/11/2020) ____________________________________________________________________________________________   Preparing for Procedure with Sedation Instructions: Oral Intake: Do not eat or drink anything for at least 8 hours prior to your procedure. Transportation: Public transportation is not allowed. Bring an adult driver. The driver must be physically present in our waiting room before any procedure can be started. Physical Assistance: Bring an adult capable of physically assisting you, in the event you need help. Blood Pressure Medicine: Take your blood pressure medicine with a sip of water the morning of the procedure. Insulin: Take only  of your normal insulin dose. Preventing infections: Shower with an antibacterial soap the morning of your procedure. Build-up your immune system: Take 1000 mg of Vitamin C with every meal (3 times a day) the day prior to your procedure. Pregnancy: If you are pregnant, call and cancel the procedure. Sickness: If you have a cold, fever, or any active infections, call and cancel the procedure. Arrival: You must be in the facility at least 30 minutes prior to your scheduled  procedure. Children: Do not bring children with you. Dress appropriately: Bring dark clothing that you would not mind if they get stained. Valuables: Do  not bring any jewelry or valuables. Procedure appointments are reserved for interventional treatments only. No Prescription Refills. No medication changes will be discussed during procedure appointments. No disability issues will be discussed.

## 2021-09-21 NOTE — Progress Notes (Deleted)
Nursing Pain Medication Assessment:  ?Safety precautions to be maintained throughout the outpatient stay will include: orient to surroundings, keep bed in low position, maintain call bell within reach at all times, provide assistance with transfer out of bed and ambulation.  ?Medication Inspection Compliance: Wanda [name] did not comply with our request to bring her pills to be counted. She was reminded that bringing the medication bottles, even when empty, is a requirement. ? ?Medication: None brought in. ?Pill/Patch Count: None available to be counted. ?Bottle Appearance: No container available. Did not bring bottle(s) to appointment. ?Filled Date: N/A ?Last Medication intake:  YesterdaySafety precautions to be maintained throughout the outpatient stay will include: orient to surroundings, keep bed in low position, maintain call bell within reach at all times, provide assistance with transfer out of bed and ambulation.  ? ?           She think that her daughter throw away the bottle because it was empty. ?

## 2021-10-16 NOTE — Progress Notes (Signed)
PROVIDER NOTE: Information contained herein reflects review and annotations entered in association with encounter. Interpretation of such information and data should be left to medically-trained personnel. Information provided to patient can be located elsewhere in the medical record under "Patient Instructions". Document created using STT-dictation technology, any transcriptional errors that may result from process are unintentional.  ?  ?Patient: Wanda [name]  Service Category: E/M  Provider: Gaspar Cola, MD  ?DOB: [birth year]  DOS: 10/17/2021  Specialty: Interventional Pain Management  ?MRN: [id number]  Setting: Ambulatory outpatient  PCP: Colon Branch, MD  ?Type: Established Patient    Referring Provider: Colon Branch, MD  ?Location: Office  Delivery: Face-to-face    ? ?HPI  ?Wanda [name], a 76 y.o. year old female, is here today because of her Chronic bilateral low back pain without sciatica [M54.50, G89.29]. Wanda [name] primary complain today is Back Pain ?Last encounter: My last encounter with her was on 09/21/2021. 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); Chronic sacroiliac joint pain (Right); Chronic radicular pain of lower extremity (Left) (S1 dermatome); Osteoarthritis of hips (Bilateral); Chronic knee pain (1ry area of Pain) (Bilateral) (L>R); Osteoarthritis of knee (Bilateral); Chronic hip pain (Bilateral) (L>R); Chronic low back pain (Bilateral) (L>R) w/ sciatica (Left); Chronic musculoskeletal pain; Abnormal MRI, lumbar spine (03/20/2018); Spondylosis without myelopathy or radiculopathy, lumbosacral region; DDD (degenerative disc disease), lumbosacral; Chronic low  back pain (Bilateral) (R>L) w/o sciatica; Chronic knee pain (Left); Chronic hip pain (Right); Fibromyalgia; Tricompartment osteoarthritis of knee (Left); Osteoarthritis of knee (Left); Arthropathy of knee (Left); Secondary osteoarthritis of knee (Bilateral); Enthesopathy of knee region (Left); Numbness and tingling of upper extremity (Left); Cervical radiculopathy at C8 (Left); Cervical radiculopathy at C7 (Left); DDD (degenerative disc disease), thoracolumbar; Chronic sacroiliac joint arthropathy (Bilateral); Degenerative joint disease of sacroiliac joint (Bilateral) ([place]); Osteoarthritis of sacroiliac joints (Bilateral) ([place]); Somatic dysfunction of sacroiliac joints (Bilateral); Lumbosacral facet arthropathy (Multilevel) (Bilateral); Grade 1 Anterolisthesis of lumbosacral spine (4-5 mm) (L5/S1); Grade 1  Anterolisthesis of lumbar spine (2-3 mm) (L4/L5); Lumbosacral facet hypertrophy (Multilevel) (Bilateral); and Low back pain of over 3 months duration on their pertinent problem list. ?Pain Assessment: Severity of Chronic pain is reported as a 6 /10. Location: Back Right, Left/pain radiaities down both leg to her foot. Onset: More than a month ago. Quality: Aching, Burning, Constant, Throbbing. Timing: Constant. Modifying factor(s): Meds, heating and laying down. ?Vitals:  height is '5\' 4"'$  (1.626 m) and weight is 232 lb (105.2 kg). Her temperature is 97.2 ?F (36.2 ?C) (abnormal). Her blood pressure is 132/85 and her pulse is 77. Her respiration is 16 and oxygen saturation is 100%.  ? ?Reason for encounter: medication management.  The patient indicates doing well with the current medication regimen. No adverse reactions or side effects reported to the medications.  The patient continues to have debilitating low back pain that is preventing her from doing being active and doing things that she would like to do. ? ?I have been treating this patient since before December 2016 and she has always responded rather well to  the facet joint treatments.  Recently [employer] denied treatment claiming that we had not indicated that the pain was chronic or severe, that was not predominantly axial in the lumbar region, and  that we had not specified that the pain was severe enough to cause functional deficits and inability of the patient to perform daily activities measured on a pain or disability scale.  They also claimed that we should state in the record why the patient should not have a radiofrequency ablation and that we should also include the patient's current pain score.  In addition, they also claimed that prior to making their decision they had reached out to me for further information.  Although I would really like to be professional about this, it is extremely difficult to do this when facing such misleading information.  To start with, nobody contacted me.  Second, everyone of our notes has the patient's pain score for that day.  Third the note clearly stated as this one does that we will not be doing radiofrequency ablation until the patient brings her BMI down to less than 35 kg/m?Wanda [name]  She is currently at 39.82 kg/m?Wanda [name]  When I go over the "notice of denial" the 1 thing that is extremely clear to me is that they did not take the time to read the note as all of the above information is present on it.  Our records demonstrate that the patient has been properly and successfully treated with these palliative facet blocks since before 07/06/2015.  In fact, the last time that the patient felt the need to have her lumbar facet syndrome treated was on 12/23/2020 at which time we did a bilateral procedure which provided the patient with excellent benefits until 09/21/2021 (9 months later), and the pain started coming back and she requested the procedure to be repeated, only to have [employer] deny it for the above fabricated reasons. ? ?Statement of Medical Necessity:  ?Ms. Thigpencontinues to experienced debilitating chronic nerve-associated pain  from the Lumbar Facet Syndrome (Spondylosis without myelopathy or radiculopathy, lumbar region [M47.816]). ? ?Duration: This pain has persisted for longer than three months. ? ?Non-surgical care: The patient has either failed to respond, or was unable to tolerate, or simply did not get enough benefit from other more conservative therapies including, but not limited to: ?1. Over-the-counter oral analgesic medications (i.e.: ibuprofen, naproxen, etc.) ?2. Anti-inflammatory medications ?3. Muscle relaxants ?4. Membrane stabilizers ?5. Opioids ?6. Physical therapy (PT), chiropractic manipulation, and/or home exercise program (HEP). ?7. Modalities (Heat, ice, etc.) ? ?Invasive therapies:  Prior lumbar facet medial branch blocks have been effective in controlling this pain for up to 9 months at a time. ? ?Surgical care: Not indicated. ? ?Physical exam: Has been consistent with Lumbosacral Facet Syndrome. ? ?Diagnostic imaging: Lumbosacral Facet Arthrosis.  ?             ? ?Diagnostic interventional therapies: Ms. Cush has attained greater than 50% reduction in pain from  diagnostic/therapeutic lumbar facet blocks  conducted in separate occasions.  ? ?For the above listed reason, I believe, as the examining and treating physician, that it is medically necessary to proceed with  therapeutic lumbar facet medial branch blocks  for the purpose of providing the patient with relief of her chronic low back pain symptoms. ? ?At this point, after having personally evaluated this patient and being the treating attending physician, it is my professional opinion, as a board-certified pain management specialist, that it is medically necessary for this patient to undergo the treatments that I am ordering.  Any attempt at denying these treatments, by a health care insurance entity, represents a case of "practicing medicine without a license", which is currently illegal  in the state of [place].  Furthermore, if this company  attempts to use a Mudlogger" to deny such treatment without having performed a face-to-face evaluation, represents an act of gross negligence and malpractice. ? ? ?RTCB: 01/19/2022 ?Nonopioids tran

## 2021-10-17 ENCOUNTER — Other Ambulatory Visit: Payer: Self-pay

## 2021-10-17 ENCOUNTER — Encounter: Payer: Self-pay | Admitting: Pain Medicine

## 2021-10-17 ENCOUNTER — Ambulatory Visit: Payer: Medicare HMO | Attending: Pain Medicine | Admitting: Pain Medicine

## 2021-10-17 VITALS — BP 132/85 | HR 77 | Temp 97.2°F | Resp 16 | Ht 64.0 in | Wt 232.0 lb

## 2021-10-17 DIAGNOSIS — Z79899 Other long term (current) drug therapy: Secondary | ICD-10-CM

## 2021-10-17 DIAGNOSIS — M4317 Spondylolisthesis, lumbosacral region: Secondary | ICD-10-CM

## 2021-10-17 DIAGNOSIS — M25561 Pain in right knee: Secondary | ICD-10-CM

## 2021-10-17 DIAGNOSIS — G894 Chronic pain syndrome: Secondary | ICD-10-CM

## 2021-10-17 DIAGNOSIS — M47816 Spondylosis without myelopathy or radiculopathy, lumbar region: Secondary | ICD-10-CM

## 2021-10-17 DIAGNOSIS — R937 Abnormal findings on diagnostic imaging of other parts of musculoskeletal system: Secondary | ICD-10-CM

## 2021-10-17 DIAGNOSIS — M4316 Spondylolisthesis, lumbar region: Secondary | ICD-10-CM | POA: Diagnosis not present

## 2021-10-17 DIAGNOSIS — M51379 Other intervertebral disc degeneration, lumbosacral region without mention of lumbar back pain or lower extremity pain: Secondary | ICD-10-CM

## 2021-10-17 DIAGNOSIS — M47817 Spondylosis without myelopathy or radiculopathy, lumbosacral region: Secondary | ICD-10-CM | POA: Diagnosis not present

## 2021-10-17 DIAGNOSIS — M533 Sacrococcygeal disorders, not elsewhere classified: Secondary | ICD-10-CM

## 2021-10-17 DIAGNOSIS — M545 Low back pain, unspecified: Secondary | ICD-10-CM | POA: Diagnosis not present

## 2021-10-17 DIAGNOSIS — M5137 Other intervertebral disc degeneration, lumbosacral region: Secondary | ICD-10-CM | POA: Diagnosis not present

## 2021-10-17 DIAGNOSIS — M25562 Pain in left knee: Secondary | ICD-10-CM

## 2021-10-17 DIAGNOSIS — M5135 Other intervertebral disc degeneration, thoracolumbar region: Secondary | ICD-10-CM

## 2021-10-17 DIAGNOSIS — M7918 Myalgia, other site: Secondary | ICD-10-CM

## 2021-10-17 DIAGNOSIS — Z79891 Long term (current) use of opiate analgesic: Secondary | ICD-10-CM

## 2021-10-17 DIAGNOSIS — G8929 Other chronic pain: Secondary | ICD-10-CM

## 2021-10-17 MED ORDER — HYDROCODONE-ACETAMINOPHEN 5-325 MG PO TABS: 1.0000 | ORAL_TABLET | Freq: Three times a day (TID) | ORAL | 0 refills | Status: DC | PRN

## 2021-10-17 NOTE — Progress Notes (Signed)
Nursing Pain Medication Assessment:  ?Safety precautions to be maintained throughout the outpatient stay will include: orient to surroundings, keep bed in low position, maintain call bell within reach at all times, provide assistance with transfer out of bed and ambulation.  ?Medication Inspection Compliance: Pill count conducted under aseptic conditions, in front of the patient. Neither the pills nor the bottle was removed from the patient's sight at any time. Once count was completed pills were immediately returned to the patient in their original bottle. ? ?Medication: Hydrocodone/APAP ?Pill/Patch Count:  16 of 90 pills remain ?Pill/Patch Appearance: Markings consistent with prescribed medication ?Bottle Appearance: Standard pharmacy container. Clearly labeled. ?Filled Date: 3 / 1 / 2023 ?Last Medication intake:  TodaySafety precautions to be maintained throughout the outpatient stay will include: orient to surroundings, keep bed in low position, maintain call bell within reach at all times, provide assistance with transfer out of bed and ambulation.  ?

## 2021-10-17 NOTE — Patient Instructions (Signed)
____________________________________________________________________________________________ ? ?Medication Rules ? ?Purpose: To inform patients, and their family members, of our rules and regulations. ? ?Applies to: All patients receiving prescriptions (written or electronic). ? ?Pharmacy of record: Pharmacy where electronic prescriptions will be sent. If written prescriptions are taken to a different pharmacy, please inform the nursing staff. The pharmacy listed in the electronic medical record should be the one where you would like electronic prescriptions to be sent. ? ?Electronic prescriptions: In compliance with the [place] Strengthen Opioid Misuse Prevention (STOP) Act of 2017 (Session Law 2017-74/H243), effective July 24, 2018, all controlled substances must be electronically prescribed. Calling prescriptions to the pharmacy will cease to exist. ? ?Prescription refills: Only during scheduled appointments. Applies to all prescriptions. ? ?NOTE: The following applies primarily to controlled substances (Opioid* Pain Medications).  ? ?Type of encounter (visit): For patients receiving controlled substances, face-to-face visits are required. (Not an option or up to the patient.) ? ?Patient's responsibilities: ?Pain Pills: Bring all pain pills to every appointment (except for procedure appointments). ?Pill Bottles: Bring pills in original pharmacy bottle. Always bring the newest bottle. Bring bottle, even if empty. ?Medication refills: You are responsible for knowing and keeping track of what medications you take and those you need refilled. ?The day before your appointment: write a list of all prescriptions that need to be refilled. ?The day of the appointment: give the list to the admitting nurse. Prescriptions will be written only during appointments. No prescriptions will be written on procedure days. ?If you forget a medication: it will not be "Called in", "Faxed", or "electronically sent". You will  need to get another appointment to get these prescribed. ?No early refills. Do not call asking to have your prescription filled early. ?Prescription Accuracy: You are responsible for carefully inspecting your prescriptions before leaving our office. Have the discharge nurse carefully go over each prescription with you, before taking them home. Make sure that your name is accurately spelled, that your address is correct. Check the name and dose of your medication to make sure it is accurate. Check the number of pills, and the written instructions to make sure they are clear and accurate. Make sure that you are given enough medication to last until your next medication refill appointment. ?Taking Medication: Take medication as prescribed. When it comes to controlled substances, taking less pills or less frequently than prescribed is permitted and encouraged. ?Never take more pills than instructed. ?Never take medication more frequently than prescribed.  ?Inform other Doctors: Always inform, all of your healthcare providers, of all the medications you take. ?Pain Medication from other Providers: You are not allowed to accept any additional pain medication from any other Doctor or Healthcare provider. There are two exceptions to this rule. (see below) In the event that you require additional pain medication, you are responsible for notifying us, as stated below. ?Cough Medicine: Often these contain an opioid, such as codeine or hydrocodone. Never accept or take cough medicine containing these opioids if you are already taking an opioid* medication. The combination may cause respiratory failure and death. ?Medication Agreement: You are responsible for carefully reading and following our Medication Agreement. This must be signed before receiving any prescriptions from our practice. Safely store a copy of your signed Agreement. Violations to the Agreement will result in no further prescriptions. (Additional copies of our  Medication Agreement are available upon request.) ?Laws, Rules, & Regulations: All patients are expected to follow all Federal and State Laws, Statutes, Rules, & Regulations. Ignorance of   the Laws does not constitute a valid excuse.  ?Illegal drugs and Controlled Substances: The use of illegal substances (including, but not limited to marijuana and its derivatives) and/or the illegal use of any controlled substances is strictly prohibited. Violation of this rule may result in the immediate and permanent discontinuation of any and all prescriptions being written by our practice. The use of any illegal substances is prohibited. ?Adopted CDC guidelines & recommendations: Target dosing levels will be at or below 60 MME/day. Use of benzodiazepines** is not recommended. ? ?Exceptions: There are only two exceptions to the rule of not receiving pain medications from other Healthcare Providers. ?Exception #1 (Emergencies): In the event of an emergency (i.e.: accident requiring emergency care), you are allowed to receive additional pain medication. However, you are responsible for: As soon as you are able, call our office [phone], at any time of the day or night, and leave a message stating your name, the date and nature of the emergency, and the name and dose of the medication prescribed. In the event that your call is answered by a member of our staff, make sure to document and save the date, time, and the name of the person that took your information.  ?Exception #2 (Planned Surgery): In the event that you are scheduled by another doctor or dentist to have any type of surgery or procedure, you are allowed (for a period no longer than 30 days), to receive additional pain medication, for the acute post-op pain. However, in this case, you are responsible for picking up a copy of our "Post-op Pain Management for Surgeons" handout, and giving it to your surgeon or dentist. This document is available at our office, and  does not require an appointment to obtain it. Simply go to our office during business hours (Monday-Thursday from 8:00 AM to 4:00 PM) (Friday 8:00 AM to 12:00 Noon) or if you have a scheduled appointment with us, prior to your surgery, and ask for it by name. In addition, you are responsible for: calling our office [phone], at any time of the day or night, and leaving a message stating your name, name of your surgeon, type of surgery, and date of procedure or surgery. Failure to comply with your responsibilities may result in termination of therapy involving the controlled substances. ?Medication Agreement Violation. Following the above rules, including your responsibilities will help you in avoiding a Medication Agreement Violation (?Breaking your Pain Medication Contract?). ? ?*Opioid medications include: morphine, codeine, oxycodone, oxymorphone, hydrocodone, hydromorphone, meperidine, tramadol, tapentadol, buprenorphine, fentanyl, methadone. ?**Benzodiazepine medications include: diazepam (Valium), alprazolam (Xanax), clonazepam (Klonopine), lorazepam (Ativan), clorazepate (Tranxene), chlordiazepoxide (Librium), estazolam (Prosom), oxazepam (Serax), temazepam (Restoril), triazolam (Halcion) ?(Last updated: 04/20/2021) ?____________________________________________________________________________________________ ? ____________________________________________________________________________________________ ? ?Medication Recommendations and Reminders ? ?Applies to: All patients receiving prescriptions (written and/or electronic). ? ?Medication Rules & Regulations: These rules and regulations exist for your safety and that of others. They are not flexible and neither are we. Dismissing or ignoring them will be considered "non-compliance" with medication therapy, resulting in complete and irreversible termination of such therapy. (See document titled "Medication Rules" for more details.) In all conscience,  because of safety reasons, we cannot continue providing a therapy where the patient does not follow instructions. ? ?Pharmacy of record:  ?Definition: This is the pharmacy where your electronic prescriptions w

## 2021-10-19 ENCOUNTER — Ambulatory Visit: Payer: Medicare HMO | Admitting: Orthopaedic Surgery

## 2021-10-19 ENCOUNTER — Telehealth: Payer: Self-pay | Admitting: Internal Medicine

## 2021-10-19 NOTE — Telephone Encounter (Signed)
Left message for patient to call back and schedule Medicare Annual Wellness Visit (AWV) in office.  ? ?If not able to come in office, please offer to do virtually or by telephone.  Left office number and my jabber [phone]. ? ?Last AWV:08/30/2016 ? ?Please schedule at anytime with Nurse Health Advisor. ?  ?

## 2021-10-31 ENCOUNTER — Telehealth: Payer: Self-pay

## 2021-10-31 NOTE — Telephone Encounter (Signed)
Insurance Treatment Denial Note  ?Date order was entered:  ?Order entered by: Milinda Pointer, MD ?Requested treatment: lumbar facets ?Reason for denial: Pertinent documentation of imaging is missing ?Recommended for approval:  Need MRI and PT  ?  ?They need an updated MRI and documentation of PT or physician directed home exercise program in order to approve the lumbar facets.  ? ?

## 2021-11-23 ENCOUNTER — Telehealth: Payer: Self-pay | Admitting: Pain Medicine

## 2021-11-23 NOTE — Telephone Encounter (Signed)
Spoke with pharmacy and they state that they have her script and will fill it.  Patient notified.   ?

## 2021-11-23 NOTE — Telephone Encounter (Signed)
Patient called to get meds refilled and was told they dont have any scripts to fill for her. Please call her pharmacy and find out the problem. She needs to fill today.  Please advise patient. ?

## 2021-11-30 ENCOUNTER — Telehealth: Payer: Self-pay | Admitting: Internal Medicine

## 2021-11-30 NOTE — Telephone Encounter (Signed)
Left message for patient to call back and schedule Medicare Annual Wellness Visit (AWV).  ? ?Please offer to do virtually or by telephone.  Left office number and my jabber [phone]. ? ?Last AWV:08/30/2016 ? ?Please schedule at anytime with Nurse Health Advisor. ?  ?

## 2021-12-03 ENCOUNTER — Other Ambulatory Visit: Payer: Self-pay | Admitting: Internal Medicine

## 2021-12-28 ENCOUNTER — Telehealth: Payer: Self-pay | Admitting: Internal Medicine

## 2021-12-28 NOTE — Telephone Encounter (Signed)
Left message for patient to call back and schedule Medicare Annual Wellness Visit (AWV).   Please offer to do virtually or by telephone.  Left office number and my jabber [phone].  Last AWV:08/30/2016  Please schedule at anytime with Nurse Health Advisor.

## 2022-01-09 NOTE — Progress Notes (Unsigned)
The patient again did not show up to her appointment.  (01/11/2022)

## 2022-01-11 ENCOUNTER — Ambulatory Visit (HOSPITAL_BASED_OUTPATIENT_CLINIC_OR_DEPARTMENT_OTHER): Payer: Medicare HMO | Admitting: Pain Medicine

## 2022-01-11 DIAGNOSIS — M47816 Spondylosis without myelopathy or radiculopathy, lumbar region: Secondary | ICD-10-CM

## 2022-01-11 DIAGNOSIS — M4316 Spondylolisthesis, lumbar region: Secondary | ICD-10-CM

## 2022-01-11 DIAGNOSIS — M4317 Spondylolisthesis, lumbosacral region: Secondary | ICD-10-CM

## 2022-01-11 DIAGNOSIS — Z79891 Long term (current) use of opiate analgesic: Secondary | ICD-10-CM

## 2022-01-11 DIAGNOSIS — Z91199 Patient's noncompliance with other medical treatment and regimen due to unspecified reason: Secondary | ICD-10-CM

## 2022-01-11 DIAGNOSIS — G894 Chronic pain syndrome: Secondary | ICD-10-CM

## 2022-01-11 DIAGNOSIS — M5135 Other intervertebral disc degeneration, thoracolumbar region: Secondary | ICD-10-CM

## 2022-01-11 DIAGNOSIS — Z79899 Other long term (current) drug therapy: Secondary | ICD-10-CM

## 2022-01-11 DIAGNOSIS — G8929 Other chronic pain: Secondary | ICD-10-CM

## 2022-01-11 NOTE — Patient Instructions (Signed)
____________________________________________________________________________________________  Medication Rules  Purpose: To inform patients, and their family members, of our rules and regulations.  Applies to: All patients receiving prescriptions (written or electronic).  Pharmacy of record: Pharmacy where electronic prescriptions will be sent. If written prescriptions are taken to a different pharmacy, please inform the nursing staff. The pharmacy listed in the electronic medical record should be the one where you would like electronic prescriptions to be sent.  Electronic prescriptions: In compliance with the [place] Strengthen Opioid Misuse Prevention (STOP) Act of 2017 (Session Law 2017-74/H243), effective July 24, 2018, all controlled substances must be electronically prescribed. Calling prescriptions to the pharmacy will cease to exist.  Prescription refills: Only during scheduled appointments. Applies to all prescriptions.  NOTE: The following applies primarily to controlled substances (Opioid* Pain Medications).   Type of encounter (visit): For patients receiving controlled substances, face-to-face visits are required. (Not an option or up to the patient.)  Patient's responsibilities: Pain Pills: Bring all pain pills to every appointment (except for procedure appointments). Pill Bottles: Bring pills in original pharmacy bottle. Always bring the newest bottle. Bring bottle, even if empty. Medication refills: You are responsible for knowing and keeping track of what medications you take and those you need refilled. The day before your appointment: write a list of all prescriptions that need to be refilled. The day of the appointment: give the list to the admitting nurse. Prescriptions will be written only during appointments. No prescriptions will be written on procedure days. If you forget a medication: it will not be "Called in", "Faxed", or "electronically sent". You will  need to get another appointment to get these prescribed. No early refills. Do not call asking to have your prescription filled early. Prescription Accuracy: You are responsible for carefully inspecting your prescriptions before leaving our office. Have the discharge nurse carefully go over each prescription with you, before taking them home. Make sure that your name is accurately spelled, that your address is correct. Check the name and dose of your medication to make sure it is accurate. Check the number of pills, and the written instructions to make sure they are clear and accurate. Make sure that you are given enough medication to last until your next medication refill appointment. Taking Medication: Take medication as prescribed. When it comes to controlled substances, taking less pills or less frequently than prescribed is permitted and encouraged. Never take more pills than instructed. Never take medication more frequently than prescribed.  Inform other Doctors: Always inform, all of your healthcare providers, of all the medications you take. Pain Medication from other Providers: You are not allowed to accept any additional pain medication from any other Doctor or Healthcare provider. There are two exceptions to this rule. (see below) In the event that you require additional pain medication, you are responsible for notifying us, as stated below. Cough Medicine: Often these contain an opioid, such as codeine or hydrocodone. Never accept or take cough medicine containing these opioids if you are already taking an opioid* medication. The combination may cause respiratory failure and death. Medication Agreement: You are responsible for carefully reading and following our Medication Agreement. This must be signed before receiving any prescriptions from our practice. Safely store a copy of your signed Agreement. Violations to the Agreement will result in no further prescriptions. (Additional copies of our  Medication Agreement are available upon request.) Laws, Rules, & Regulations: All patients are expected to follow all Federal and State Laws, Statutes, Rules, & Regulations. Ignorance of   the Laws does not constitute a valid excuse.  Illegal drugs and Controlled Substances: The use of illegal substances (including, but not limited to marijuana and its derivatives) and/or the illegal use of any controlled substances is strictly prohibited. Violation of this rule may result in the immediate and permanent discontinuation of any and all prescriptions being written by our practice. The use of any illegal substances is prohibited. Adopted CDC guidelines & recommendations: Target dosing levels will be at or below 60 MME/day. Use of benzodiazepines** is not recommended.  Exceptions: There are only two exceptions to the rule of not receiving pain medications from other Healthcare Providers. Exception #1 (Emergencies): In the event of an emergency (i.e.: accident requiring emergency care), you are allowed to receive additional pain medication. However, you are responsible for: As soon as you are able, call our office [phone], at any time of the day or night, and leave a message stating your name, the date and nature of the emergency, and the name and dose of the medication prescribed. In the event that your call is answered by a member of our staff, make sure to document and save the date, time, and the name of the person that took your information.  Exception #2 (Planned Surgery): In the event that you are scheduled by another doctor or dentist to have any type of surgery or procedure, you are allowed (for a period no longer than 30 days), to receive additional pain medication, for the acute post-op pain. However, in this case, you are responsible for picking up a copy of our "Post-op Pain Management for Surgeons" handout, and giving it to your surgeon or dentist. This document is available at our office, and  does not require an appointment to obtain it. Simply go to our office during business hours (Monday-Thursday from 8:00 AM to 4:00 PM) (Friday 8:00 AM to 12:00 Noon) or if you have a scheduled appointment with us, prior to your surgery, and ask for it by name. In addition, you are responsible for: calling our office [phone], at any time of the day or night, and leaving a message stating your name, name of your surgeon, type of surgery, and date of procedure or surgery. Failure to comply with your responsibilities may result in termination of therapy involving the controlled substances. Medication Agreement Violation. Following the above rules, including your responsibilities will help you in avoiding a Medication Agreement Violation ("Breaking your Pain Medication Contract").  *Opioid medications include: morphine, codeine, oxycodone, oxymorphone, hydrocodone, hydromorphone, meperidine, tramadol, tapentadol, buprenorphine, fentanyl, methadone. **Benzodiazepine medications include: diazepam (Valium), alprazolam (Xanax), clonazepam (Klonopine), lorazepam (Ativan), clorazepate (Tranxene), chlordiazepoxide (Librium), estazolam (Prosom), oxazepam (Serax), temazepam (Restoril), triazolam (Halcion) (Last updated: 04/20/2021) ____________________________________________________________________________________________  ____________________________________________________________________________________________  Medication Recommendations and Reminders  Applies to: All patients receiving prescriptions (written and/or electronic).  Medication Rules & Regulations: These rules and regulations exist for your safety and that of others. They are not flexible and neither are we. Dismissing or ignoring them will be considered "non-compliance" with medication therapy, resulting in complete and irreversible termination of such therapy. (See document titled "Medication Rules" for more details.) In all conscience,  because of safety reasons, we cannot continue providing a therapy where the patient does not follow instructions.  Pharmacy of record:  Definition: This is the pharmacy where your electronic prescriptions will be sent.  We do not endorse any particular pharmacy, however, we have experienced problems with [facility] not securing enough medication supply for the community. We do not restrict you   in your choice of pharmacy. However, once we write for your prescriptions, we will NOT be re-sending more prescriptions to fix restricted supply problems created by your pharmacy, or your insurance.  The pharmacy listed in the electronic medical record should be the one where you want electronic prescriptions to be sent. If you choose to change pharmacy, simply notify our nursing staff.  Recommendations: Keep all of your pain medications in a safe place, under lock and key, even if you live alone. We will NOT replace lost, stolen, or damaged medication. After you fill your prescription, take 1 week's worth of pills and put them away in a safe place. You should keep a separate, properly labeled bottle for this purpose. The remainder should be kept in the original bottle. Use this as your primary supply, until it runs out. Once it's gone, then you know that you have 1 week's worth of medicine, and it is time to come in for a prescription refill. If you do this correctly, it is unlikely that you will ever run out of medicine. To make sure that the above recommendation works, it is very important that you make sure your medication refill appointments are scheduled at least 1 week before you run out of medicine. To do this in an effective manner, make sure that you do not leave the office without scheduling your next medication management appointment. Always ask the nursing staff to show you in your prescription , when your medication will be running out. Then arrange for the receptionist to get you a return appointment,  at least 7 days before you run out of medicine. Do not wait until you have 1 or 2 pills left, to come in. This is very poor planning and does not take into consideration that we may need to cancel appointments due to bad weather, sickness, or emergencies affecting our staff. DO NOT ACCEPT A "Partial Fill": If for any reason your pharmacy does not have enough pills/tablets to completely fill or refill your prescription, do not allow for a "partial fill". The law allows the pharmacy to complete that prescription within 72 hours, without requiring a new prescription. If they do not fill the rest of your prescription within those 72 hours, you will need a separate prescription to fill the remaining amount, which we will NOT provide. If the reason for the partial fill is your insurance, you will need to talk to the pharmacist about payment alternatives for the remaining tablets, but again, DO NOT ACCEPT A PARTIAL FILL, unless you can trust your pharmacist to obtain the remainder of the pills within 72 hours.  Prescription refills and/or changes in medication(s):  Prescription refills, and/or changes in dose or medication, will be conducted only during scheduled medication management appointments. (Applies to both, written and electronic prescriptions.) No refills on procedure days. No medication will be changed or started on procedure days. No changes, adjustments, and/or refills will be conducted on a procedure day. Doing so will interfere with the diagnostic portion of the procedure. No phone refills. No medications will be "called into the pharmacy". No Fax refills. No weekend refills. No Holliday refills. No after hours refills.  Remember:  Business hours are:  Monday to Thursday 8:00 AM to 4:00 PM Provider's Schedule: Tajuana Kniskern, MD - Appointments are:  Medication management: Monday and Wednesday 8:00 AM to 4:00 PM Procedure day: Tuesday and Thursday 7:30 AM to 4:00 PM Bilal Lateef, MD -  Appointments are:  Medication management: Tuesday and Thursday 8:00   AM to 4:00 PM Procedure day: Monday and Wednesday 7:30 AM to 4:00 PM (Last update: 02/11/2020) ____________________________________________________________________________________________  ____________________________________________________________________________________________  CBD (cannabidiol) & Delta-8 (Delta-8 tetrahydrocannabinol) WARNING  Intro: Cannabidiol (CBD) and tetrahydrocannabinol (THC), are two natural compounds found in plants of the Cannabis genus. They can both be extracted from hemp or cannabis. Hemp and cannabis come from the Cannabis sativa plant. Both compounds interact with your body's endocannabinoid system, but they have very different effects. CBD does not produce the high sensation associated with cannabis. Delta-8 tetrahydrocannabinol, also known as delta-8 THC, is a psychoactive substance found in the Cannabis sativa plant, of which marijuana and hemp are two varieties. THC is responsible for the high associated with the illicit use of marijuana.  Applicable to: All individuals currently taking or considering taking CBD (cannabidiol) and, more important, all patients taking opioid analgesic controlled substances (pain medication). (Example: oxycodone; oxymorphone; hydrocodone; hydromorphone; morphine; methadone; tramadol; tapentadol; fentanyl; buprenorphine; butorphanol; dextromethorphan; meperidine; codeine; etc.)  Legal status: CBD remains a Schedule I drug prohibited for any use. CBD is illegal with one exception. In the [place], CBD has a limited Food and Drug Administration (FDA) approval for the treatment of two specific types of epilepsy disorders. Only one CBD product has been approved by the FDA for this purpose: "Epidiolex". FDA is aware that some companies are marketing products containing cannabis and cannabis-derived compounds in ways that violate the Federal Food, Drug and Cosmetic Act  (FD&C Act) and that may put the health and safety of consumers at risk. The FDA, a Federal agency, has not enforced the CBD status since 2018. UPDATE: (09/09/2021) The Drug Enforcement Agency (DEA) issued a letter stating that "delta" cannabinoids, including Delta-8-THCO and Delta-9-THCO, synthetically derived from hemp do not qualify as hemp and will be viewed as Schedule I drugs. (Schedule I drugs, substances, or chemicals are defined as drugs with no currently accepted medical use and a high potential for abuse. Some examples of Schedule I drugs are: heroin, lysergic acid diethylamide (LSD), marijuana (cannabis), 3,4-methylenedioxymethamphetamine (ecstasy), methaqualone, and peyote.) (https://www.dea.gov)  Legality: Some manufacturers ship CBD products nationally, which is illegal. Often such products are sold online and are therefore available throughout the country. CBD is openly sold in head shops and health food stores in some states where such sales have not been explicitly legalized. Selling unapproved products with unsubstantiated therapeutic claims is not only a violation of the law, but also can put patients at risk, as these products have not been proven to be safe or effective. Federal illegality makes it difficult to conduct research on CBD.  Reference: "FDA Regulation of Cannabis and Cannabis-Derived Products, Including Cannabidiol (CBD)" - https://www.fda.gov/news-events/public-health-focus/fda-regulation-cannabis-and-cannabis-derived-products-including-cannabidiol-cbd  Warning: CBD is not FDA approved and has not undergo the same manufacturing controls as prescription drugs.  This means that the purity and safety of available CBD may be questionable. Most of the time, despite manufacturer's claims, it is contaminated with THC (delta-9-tetrahydrocannabinol - the chemical in marijuana responsible for the "HIGH").  When this is the case, the THC contaminant will trigger a positive urine drug  screen (UDS) test for Marijuana (carboxy-THC). Because a positive UDS for any illicit substance is a violation of our medication agreement, your opioid analgesics (pain medicine) may be permanently discontinued. The FDA recently put out a warning about 5 things that everyone should be aware of regarding Delta-8 THC: Delta-8 THC products have not been evaluated or approved by the FDA for safe use and may be marketed in ways that put the   public health at risk. The FDA has received adverse event reports involving delta-8 THC-containing products. Delta-8 THC has psychoactive and intoxicating effects. Delta-8 THC manufacturing often involve use of potentially harmful chemicals to create the concentrations of delta-8 THC claimed in the marketplace. The final delta-8 THC product may have potentially harmful by-products (contaminants) due to the chemicals used in the process. Manufacturing of delta-8 THC products may occur in uncontrolled or unsanitary settings, which may lead to the presence of unsafe contaminants or other potentially harmful substances. Delta-8 THC products should be kept out of the reach of children and pets.  MORE ABOUT CBD  General Information: CBD was discovered in 1940 and it is a derivative of the cannabis sativa genus plants (Marijuana and Hemp). It is one of the 113 identified substances found in Marijuana. It accounts for up to 40% of the plant's extract. As of 2018, preliminary clinical studies on CBD included research for the treatment of anxiety, movement disorders, and pain. CBD is available and consumed in multiple forms, including inhalation of smoke or vapor, as an aerosol spray, and by mouth. It may be supplied as an oil containing CBD, capsules, dried cannabis, or as a liquid solution. CBD is thought not to be as psychoactive as THC (delta-9-tetrahydrocannabinol - the chemical in marijuana responsible for the "HIGH"). Studies suggest that CBD may interact with different  biological target receptors in the body, including cannabinoid and other neurotransmitter receptors. As of 2018 the mechanism of action for its biological effects has not been determined.  Side-effects  Adverse reactions: Dry mouth, diarrhea, decreased appetite, fatigue, drowsiness, malaise, weakness, sleep disturbances, and others.  Drug interactions: CBC may interact with other medications such as blood-thinners. Because CBD causes drowsiness on its own, it also increases the drowsiness caused by other medications, including antihistamines (such as Benadryl), benzodiazepines (Xanax, Ativan, Valium), antipsychotics, antidepressants and opioids, as well as alcohol and supplements such as kava, melatonin and St. John's Wort. Be cautious with the following combinations:   Brivaracetam (Briviact) Brivaracetam is changed and broken down by the body. CBD might decrease how quickly the body breaks down brivaracetam. This might increase levels of brivaracetam in the body.  Caffeine Caffeine is changed and broken down by the body. CBD might decrease how quickly the body breaks down caffeine. This might increase levels of caffeine in the body.  Carbamazepine (Tegretol) Carbamazepine is changed and broken down by the body. CBD might decrease how quickly the body breaks down carbamazepine. This might increase levels of carbamazepine in the body and increase its side effects.  Citalopram (Celexa) Citalopram is changed and broken down by the body. CBD might decrease how quickly the body breaks down citalopram. This might increase levels of citalopram in the body and increase its side effects.  Clobazam (Onfi) Clobazam is changed and broken down by the liver. CBD might decrease how quickly the liver breaks down clobazam. This might increase the effects and side effects of clobazam.  Eslicarbazepine (Aptiom) Eslicarbazepine is changed and broken down by the body. CBD might decrease how quickly the body  breaks down eslicarbazepine. This might increase levels of eslicarbazepine in the body by a small amount.  Everolimus (Zostress) Everolimus is changed and broken down by the body. CBD might decrease how quickly the body breaks down everolimus. This might increase levels of everolimus in the body.  Lithium Taking higher doses of CBD might increase levels of lithium. This can increase the risk of lithium toxicity.  Medications changed by the liver (  Cytochrome P450 1A1 (CYP1A1) substrates) Some medications are changed and broken down by the liver. CBD might change how quickly the liver breaks down these medications. This could change the effects and side effects of these medications.  Medications changed by the liver (Cytochrome P450 1A2 (CYP1A2) substrates) Some medications are changed and broken down by the liver. CBD might change how quickly the liver breaks down these medications. This could change the effects and side effects of these medications.  Medications changed by the liver (Cytochrome P450 1B1 (CYP1B1) substrates) Some medications are changed and broken down by the liver. CBD might change how quickly the liver breaks down these medications. This could change the effects and side effects of these medications.  Medications changed by the liver (Cytochrome P450 2A6 (CYP2A6) substrates) Some medications are changed and broken down by the liver. CBD might change how quickly the liver breaks down these medications. This could change the effects and side effects of these medications.  Medications changed by the liver (Cytochrome P450 2B6 (CYP2B6) substrates) Some medications are changed and broken down by the liver. CBD might change how quickly the liver breaks down these medications. This could change the effects and side effects of these medications.  Medications changed by the liver (Cytochrome P450 2C19 (CYP2C19) substrates) Some medications are changed and broken down by the liver.  CBD might change how quickly the liver breaks down these medications. This could change the effects and side effects of these medications.  Medications changed by the liver (Cytochrome P450 2C8 (CYP2C8) substrates) Some medications are changed and broken down by the liver. CBD might change how quickly the liver breaks down these medications. This could change the effects and side effects of these medications.  Medications changed by the liver (Cytochrome P450 2C9 (CYP2C9) substrates) Some medications are changed and broken down by the liver. CBD might change how quickly the liver breaks down these medications. This could change the effects and side effects of these medications.  Medications changed by the liver (Cytochrome P450 2D6 (CYP2D6) substrates) Some medications are changed and broken down by the liver. CBD might change how quickly the liver breaks down these medications. This could change the effects and side effects of these medications.  Medications changed by the liver (Cytochrome P450 2E1 (CYP2E1) substrates) Some medications are changed and broken down by the liver. CBD might change how quickly the liver breaks down these medications. This could change the effects and side effects of these medications.  Medications changed by the liver (Cytochrome P450 3A4 (CYP3A4) substrates) Some medications are changed and broken down by the liver. CBD might change how quickly the liver breaks down these medications. This could change the effects and side effects of these medications.  Medications changed by the liver (Glucuronidated drugs) Some medications are changed and broken down by the liver. CBD might change how quickly the liver breaks down these medications. This could change the effects and side effects of these medications.  Medications that decrease the breakdown of other medications by the liver (Cytochrome P450 2C19 (CYP2C19) inhibitors) CBD is changed and broken down by the liver.  Some drugs decrease how quickly the liver changes and breaks down CBD. This could change the effects and side effects of CBD.  Medications that decrease the breakdown of other medications in the liver (Cytochrome P450 3A4 (CYP3A4) inhibitors) CBD is changed and broken down by the liver. Some drugs decrease how quickly the liver changes and breaks down CBD. This could change the effects   and side effects of CBD.  Medications that increase breakdown of other medications by the liver (Cytochrome P450 3A4 (CYP3A4) inducers) CBD is changed and broken down by the liver. Some drugs increase how quickly the liver changes and breaks down CBD. This could change the effects and side effects of CBD.  Medications that increase the breakdown of other medications by the liver (Cytochrome P450 2C19 (CYP2C19) inducers) CBD is changed and broken down by the liver. Some drugs increase how quickly the liver changes and breaks down CBD. This could change the effects and side effects of CBD.  Methadone (Dolophine) Methadone is broken down by the liver. CBD might decrease how quickly the liver breaks down methadone. Taking cannabidiol along with methadone might increase the effects and side effects of methadone.  Rufinamide (Banzel) Rufinamide is changed and broken down by the body. CBD might decrease how quickly the body breaks down rufinamide. This might increase levels of rufinamide in the body by a small amount.  Sedative medications (CNS depressants) CBD might cause sleepiness and slowed breathing. Some medications, called sedatives, can also cause sleepiness and slowed breathing. Taking CBD with sedative medications might cause breathing problems and/or too much sleepiness.  Sirolimus (Rapamune) Sirolimus is changed and broken down by the body. CBD might decrease how quickly the body breaks down sirolimus. This might increase levels of sirolimus in the body.  Stiripentol (Diacomit) Stiripentol is changed and  broken down by the body. CBD might decrease how quickly the body breaks down stiripentol. This might increase levels of stiripentol in the body and increase its side effects.  Tacrolimus (Prograf) Tacrolimus is changed and broken down by the body. CBD might decrease how quickly the body breaks down tacrolimus. This might increase levels of tacrolimus in the body.  Tamoxifen (Soltamox) Tamoxifen is changed and broken down by the body. CBD might affect how quickly the body breaks down tamoxifen. This might affect levels of tamoxifen in the body.  Topiramate (Topamax) Topiramate is changed and broken down by the body. CBD might decrease how quickly the body breaks down topiramate. This might increase levels of topiramate in the body by a small amount.  Valproate Valproic acid can cause liver injury. Taking cannabidiol with valproic acid might increase the chance of liver injury. CBD and/or valproic acid might need to be stopped, or the dose might need to be reduced.  Warfarin (Coumadin) CBD might increase levels of warfarin, which can increase the risk for bleeding. CBD and/or warfarin might need to be stopped, or the dose might need to be reduced.  Zonisamide Zonisamide is changed and broken down by the body. CBD might decrease how quickly the body breaks down zonisamide. This might increase levels of zonisamide in the body by a small amount. (Last update: 09/21/2021) ____________________________________________________________________________________________  ____________________________________________________________________________________________  Drug Holidays (Slow)  What is a "Drug Holiday"? Drug Holiday: is the name given to the period of time during which a patient stops taking a medication(s) for the purpose of eliminating tolerance to the drug.  Benefits Improved effectiveness of opioids. Decreased opioid dose needed to achieve benefits. Improved pain with lesser  dose.  What is tolerance? Tolerance: is the progressive decreased in effectiveness of a drug due to its repetitive use. With repetitive use, the body gets use to the medication and as a consequence, it loses its effectiveness. This is a common problem seen with opioid pain medications. As a result, a larger dose of the drug is needed to achieve the same effect that   used to be obtained with a smaller dose.  How long should a "Drug Holiday" last? You should stay off of the pain medicine for at least 14 consecutive days. (2 weeks)  Should I stop the medicine "cold turkey"? No. You should always coordinate with your Pain Specialist so that he/she can provide you with the correct medication dose to make the transition as smoothly as possible.  How do I stop the medicine? Slowly. You will be instructed to decrease the daily amount of pills that you take by one (1) pill every seven (7) days. This is called a "slow downward taper" of your dose. For example: if you normally take four (4) pills per day, you will be asked to drop this dose to three (3) pills per day for seven (7) days, then to two (2) pills per day for seven (7) days, then to one (1) per day for seven (7) days, and at the end of those last seven (7) days, this is when the "Drug Holiday" would start.   Will I have withdrawals? By doing a "slow downward taper" like this one, it is unlikely that you will experience any significant withdrawal symptoms. Typically, what triggers withdrawals is the sudden stop of a high dose opioid therapy. Withdrawals can usually be avoided by slowly decreasing the dose over a prolonged period of time. If you do not follow these instructions and decide to stop your medication abruptly, withdrawals may be possible.  What are withdrawals? Withdrawals: refers to the wide range of symptoms that occur after stopping or dramatically reducing opiate drugs after heavy and prolonged use. Withdrawal symptoms do not occur to  patients that use low dose opioids, or those who take the medication sporadically. Contrary to benzodiazepine (example: Valium, Xanax, etc.) or alcohol withdrawals ("Delirium Tremens"), opioid withdrawals are not lethal. Withdrawals are the physical manifestation of the body getting rid of the excess receptors.  Expected Symptoms Early symptoms of withdrawal may include: Agitation Anxiety Muscle aches Increased tearing Insomnia Runny nose Sweating Yawning  Late symptoms of withdrawal may include: Abdominal cramping Diarrhea Dilated pupils Goose bumps Nausea Vomiting  Will I experience withdrawals? Due to the slow nature of the taper, it is very unlikely that you will experience any.  What is a slow taper? Taper: refers to the gradual decrease in dose.  (Last update: 02/11/2020) ____________________________________________________________________________________________   ____________________________________________________________________________________________  Pharmacy Shortages of Pain Medication   Introduction Shockingly as it may seem, .  "No [place] Supreme Court decision has ever interpreted the Constitution as guaranteeing a right to health care for all Americans." - https://www.healthequityandpolicylab.com/elusive-right-to-health-care-under-us-law  "With respect to human rights, the [place] has no formally codified right to health, nor does it participate in a human rights treaty that specifies a right to health." - Scott J. Schweikart, JD, MBE  Situation By now, most of our patients have had the experience of being told by their pharmacist that they do not have enough medication to cover their prescription. If you have not had this experience, just know that you soon will.  Problem There appears to be a shortage of these medications, either at the national level or locally. This is happening with all pharmacies. When there is not enough medication, patients  are offered a partial fill and they are told that they will try to get the rest of the medicine for them at a later time. If they do not have enough for even a partial fill, the pharmacists are telling the patients to   call us (the prescribing physicians) to request that we send another prescription to another pharmacy to get the medicine.   This reordering of a controlled substance creates documentation problems where additional paperwork needs to be created to explain why two prescriptions for the same period of time and the same medicine are being prescribed to the same patient. It also creates situations where the last appointment note does not accurately reflect when and what prescriptions were given to a patient. This leads to prescribing errors down the line, in subsequent follow-up visits.   [place] Board [employer] ([place]) Research revealed that Board of Pharmacy Rule .1806 (21 NCAC 46.1806) authorizes pharmacists to the transfer of prescriptions among pharmacies, and it sets forth procedural and recordkeeping requirements for doing so. However, this requires the pharmacist to complete the previously mentioned procedural paperwork to accomplish the transfer. As it turns out, it is much easier for them to have the prescribing physicians do the work.   Possible solutions 1. Have the [place] Assembly add a provision to the "STOP ACT" (the law that mandates how controlled substances are prescribed) where there is an exception to the electronic prescribing rule that states that in the event there are shortages of medications the physicians are allowed to use written prescriptions as opposed to electronic ones. This would allow patients to take their prescriptions to a different pharmacy that may have enough medication available to fill the prescription. The problem is that currently there is a law that does not allow for written prescriptions, with the exception of instances where the  electronic medical record is down due to technical issues.  2. Have [place] Congress ease the pressure on pharmaceutical companies, allowing them to produce enough quantities of the medication to adequately supply the population. 3. Have pharmacies keep enough stocks of these medications to cover their client base.  4. Have the [place] State Assembly add a provision to the "STOP ACT" where they ease the regulations surrounding the transfer of controlled substances between pharmacies, so as to simplify the transfer of supplies. As an alternative, develop a system to allow patients to obtain the remainder of their prescription at another one of their pharmacies or at an associate pharmacy.   How this shortage will affect you.  The one thing that is abundantly clear is that this is a pharmacy supply problem  and not a prescriber problem. The job of the prescriber is to evaluate and monitor the patients for the appropriate indications to the use of these medicines, the monitoring of their use and the prescribing of the appropriate dose and regimen. It is not the job of the prescriber to provide or dispense the actual medication. By law, this is the job of the pharmacies and pharmacists. It is certainly not the job of the prescriber to solve the supply problems.   Due to the above problems we are no longer taking patients to write for their pain medication. We will continue to evaluate for appropriate indications and we may provide recommendations regarding medication, dose, and schedule, as well as monitoring recommendations, however, we will not be taking over the actual prescribing of these substances. On those patients where we are treating their chronic pain with interventional therapies, exceptions will be considered on a case by case basis. At this time, we will try to continue providing this supplemental service to those patients we have been managing in the past. However, as of August 1st, 2023, we no  longer   will be sending additional prescriptions to other pharmacies for the purpose of solving their supply problems. Once we send a prescription to a pharmacy, we will not be resending it again to another pharmacy to cover for their shortages.   What to do. Write as many letters as you can. Recruit the help of family members in writing these letters. Below are some of the places where you can write to make your voice heard. Let them know what the problem is and push them to look for solutions.   Search internet for: "[place] find your legislators" https://www.ncleg.gov/findyourlegislators  Search internet for: "[place] insurance commissioner complaints" https://www.ncdoi.gov/contactscomplaints/assistance-or-file-complaint  Search internet for: "[place] Board of Pharmacy complaints" http://www.ncbop.org/contact.htm  Search internet for: "[facility] pharmacy complaints" Email [facility] Customer [facility] https://www.[facility].com/help/email-[email or web address].jsp?callType=store  Search internet for: "[facility] pharmacy customer service complaints" https://www.[facility].com/topic/marketing/contactus/contactus_customerservice.jsp  ____________________________________________________________________________________________   

## 2022-01-17 ENCOUNTER — Ambulatory Visit (INDEPENDENT_AMBULATORY_CARE_PROVIDER_SITE_OTHER): Payer: Medicare HMO

## 2022-01-17 DIAGNOSIS — Z0000 Encounter for general adult medical examination without abnormal findings: Secondary | ICD-10-CM | POA: Diagnosis not present

## 2022-01-17 DIAGNOSIS — Z1211 Encounter for screening for malignant neoplasm of colon: Secondary | ICD-10-CM | POA: Diagnosis not present

## 2022-01-17 NOTE — Progress Notes (Addendum)
Subjective:   Wanda [name] is a 76 y.o. female who presents for Medicare Annual (Subsequent) preventive examination.  I connected with  Wanda [name] on 01/17/22 by a audio enabled telemedicine application and verified that I am speaking with the correct person using two identifiers.  Patient Location: Home  Provider Location: Office/Clinic  I discussed the limitations of evaluation and management by telemedicine. The patient expressed understanding and agreed to proceed.   Review of Systems     Cardiac Risk Factors include: advanced age (>66mn, >>36women);hypertension;dyslipidemia     Objective:    Today's Vitals   01/17/22 1552  PainSc: 3    There is no height or weight on file to calculate BMI.     01/17/2022    3:50 PM 03/16/2021    3:38 PM 03/11/2021    2:07 PM 09/23/2020    9:50 AM 09/09/2020   10:40 AM 04/01/2020    8:19 AM 02/04/2020   11:47 AM  Advanced Directives  Does Patient Have a Medical Advance Directive? No No No No No No No  Would patient like information on creating a medical advance directive? No - Patient declined  No - Patient declined  No - Patient declined No - Patient declined No - Patient declined    Current Medications (verified) Outpatient Encounter Medications as of 01/17/2022  Medication Sig   atorvastatin (LIPITOR) 40 MG tablet Take 1 tablet (40 mg total) by mouth daily.   calcium carbonate (CALCIUM 600) 600 MG TABS tablet Take 1 tablet (600 mg total) by mouth 2 (two) times daily with a meal.   Cholecalciferol (VITAMIN D) 50 MCG (2000 UT) tablet Take 2,000 Units by mouth daily.   cyclobenzaprine (FLEXERIL) 5 MG tablet Take 1 tablet (5 mg total) by mouth 3 (three) times daily as needed for muscle spasms.   diclofenac Sodium (VOLTAREN) 1 % GEL Apply 2 g topically 4 (four) times daily.   gabapentin (NEURONTIN) 300 MG capsule Take 1 capsule (300 mg total) by mouth 3 (three) times daily.   hydrochlorothiazide (HYDRODIURIL) 25 MG tablet Take 1  tablet (25 mg total) by mouth daily.   HYDROcodone-acetaminophen (NORCO/VICODIN) 5-325 MG tablet Take 1 tablet by mouth 3 (three) times daily as needed for severe pain. Must last 30 days   lidocaine (LIDODERM) 5 % Place 1 patch onto the skin daily. Remove & Discard patch within 12 hours or as directed by MD   lisinopril (ZESTRIL) 10 MG tablet TAKE 1 TABLET(10 MG) BY MOUTH DAILY   sertraline (ZOLOFT) 100 MG tablet Take 2 tablets (200 mg total) by mouth daily. (Patient taking differently: Take 400 mg by mouth daily.)   Vitamin D, Ergocalciferol, (DRISDOL) 1.25 MG (50000 UNIT) CAPS capsule Take 1 capsule (50,000 Units total) by mouth every 7 (seven) days.   No facility-administered encounter medications on file as of 01/17/2022.    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   Diabetes Other        aunt    Kidney cancer Son    Breast cancer Maternal Aunt    Coronary artery disease Neg Hx    CAD Neg Hx    Social History   Socioeconomic History   Marital status: Married    Spouse name: Not on file   Number of children: 3   Years of education: Not on file   Highest education level: Not on file  Occupational History   Occupation: ON DISABILITY  Tobacco Use   Smoking status: Never   Smokeless tobacco: Never  Vaping Use   Vaping Use: Never used  Substance and  Sexual Activity   Alcohol use: No   Drug use: No   Sexual activity: Not Currently  Other Topics Concern   Not on file  Social History Narrative   Married, 3 children (2 sons, a daughter), 6 G-kids    Household:  pt, husband        Social Determinants of Radio broadcast assistant Strain: Low Risk  (01/17/2022)   Overall Financial Resource Strain (CARDIA)    Difficulty of Paying Living Expenses: Not hard at all  Food Insecurity: No Food Insecurity (01/17/2022)   Hunger Vital Sign    Worried About Running Out of Food in the Last Year: Never true    [place] in the Last Year: Never true  Transportation Needs: No Transportation Needs (01/17/2022)   PRAPARE - Hydrologist (Medical): No    Lack of Transportation (Non-Medical): No  Physical Activity: Inactive (01/17/2022)   Exercise Vital Sign    Days of Exercise per Week: 0 days    Minutes of Exercise per Session: 0 min  Stress: No Stress Concern Present (01/17/2022)   [place]    Feeling of Stress : Not at all  Social Connections: Moderately Integrated (01/17/2022)   Social Connection and Isolation Panel [NHANES]    Frequency of Communication with Friends and Family: Twice a week    Frequency of Social Gatherings with Friends and Family: More than three times a week    Attends Religious Services: More than 4 times per year    Active Member of [employer] or Organizations: No    Attends Music therapist: Never    Marital Status: Married    Tobacco Counseling Counseling given: Not Answered   Clinical Intake:  Pre-visit preparation completed: Yes  Pain : 0-10 Pain Score: 3  Pain Type: Chronic pain Pain Descriptors / Indicators: Aching, Dull, Sore Pain Onset: More than a month ago Pain Frequency: Constant     BMI - recorded: 38.69 Nutritional Risks: None Diabetes: No  How often do you need to have someone help  you when you read instructions, pamphlets, or other written materials from your doctor or pharmacy?: 1 - Never  Diabetic?No  Interpreter Needed?: No  Information entered by :: Ettamae Barkett   Activities of Daily Living    01/17/2022    3:55 PM 03/01/2021    1:17  PM  In your present state of health, do you have any difficulty performing the following activities:  Hearing? 0 0  Vision? 0 0  Difficulty concentrating or making decisions? 0 0  Walking or climbing stairs? 1 0  Comment knee issues   Dressing or bathing? 0 0  Doing errands, shopping? 0 0  Preparing Food and eating ? N   Using the Toilet? N   In the past six months, have you accidently leaked urine? N   Do you have problems with loss of bowel control? N   Managing your Medications? N   Managing your Finances? N   Housekeeping or managing your Housekeeping? N     Patient Care Team: Colon Branch, MD as PCP - General Milinda Pointer, MD as Referring Physician (Pain Medicine) Milus Banister, MD as Attending Physician (Gastroenterology)  Indicate any recent Medical Services you may have received from other than [facility] providers in the past year (date may be approximate).     Assessment:   This is a routine wellness examination for Pernell.  Hearing/Vision screen No results found.  Dietary issues and exercise activities discussed: Current Exercise Habits: The patient does not participate in regular exercise at present, Exercise limited by: orthopedic condition(s)   Goals Addressed   None    Depression Screen    01/17/2022    3:51 PM 08/30/2021    1:32 PM 03/01/2021    1:50 PM 09/09/2020   10:40 AM 04/01/2020    8:19 AM 02/04/2020   11:46 AM 01/16/2020    2:10 PM  PHQ 2/9 Scores  PHQ - 2 Score '1 3 3 '$ 0 0 0 2  PHQ- 9 Score  '13 13    9    '$ Fall Risk    01/17/2022    3:51 PM 10/17/2021    3:32 PM 09/21/2021    3:26 PM 08/30/2021    1:00 PM 04/05/2021   11:37 AM  Fall Risk   Falls in the past year? 1 0 0 0 0   Number falls in past yr: 0   0 0  Injury with Fall? 0   0   Risk for fall due to : History of fall(s)    No Fall Risks  Follow up Falls evaluation completed   Falls evaluation completed Falls evaluation completed    [place]:  Any stairs in or around the home? Yes  If so, are there any without handrails? No  Home free of loose throw rugs in walkways, pet beds, electrical cords, etc? Yes  Adequate lighting in your home to reduce risk of falls? Yes   ASSISTIVE DEVICES UTILIZED TO PREVENT FALLS:  Life alert? No  Use of a cane, walker or w/c? Yes  Grab bars in the bathroom? Yes  Shower chair or bench in shower? Yes  Elevated toilet seat or a handicapped toilet? Yes   TIMED UP AND GO:  Was the test performed? No .    Cognitive Function:    08/30/2016    9:07 AM  MMSE - Mini Mental State Exam  Orientation to time 5  Orientation to Place 5  Registration 3  Attention/ Calculation 5  Recall 2  Language- name 2 objects 2  Language- repeat 1  Language- follow 3 step command 3  Language- read & follow direction 1  Write a sentence 1  Copy design 1  Total score 29        01/17/2022  3:58 PM  6CIT Screen  What Year? 0 points  What month? 0 points  What time? 0 points  Count back from 20 0 points  Months in reverse 0 points  Repeat phrase 2 points  Total Score 2 points    Immunizations Immunization History  Administered Date(s) Administered   Fluad Quad(high Dose 65+) 03/26/2019   Influenza Split 05/01/2011, 03/25/2014, 04/16/2018   Influenza Whole 04/17/2008, 05/05/2009, 04/11/2010   Influenza, High Dose Seasonal PF 05/07/2013, 06/08/2021   Influenza-Unspecified 04/01/2015, 05/01/2016, 05/21/2017   PFIZER(Purple Top)SARS-COV-2 Vaccination 08/14/2019, 09/04/2019, 04/21/2020   Pfizer Covid-19 Vaccine Bivalent Booster 29yr & up 06/08/2021   Pneumococcal Conjugate-13 04/16/2014   Pneumococcal Polysaccharide-23 04/01/2015   Td  04/17/2008   Tdap 12/24/2017   Zoster, Live 05/20/2013    TDAP status: Up to date  Flu Vaccine status: Up to date  Pneumococcal vaccine status: Up to date  Covid-19 vaccine status: Completed vaccines  Qualifies for Shingles Vaccine? Yes   Zostavax completed No   Shingrix Completed?: No.    Education has been provided regarding the importance of this vaccine. Patient has been advised to call insurance company to determine out of pocket expense if they have not yet received this vaccine. Advised may also receive vaccine at local pharmacy or Health Dept. Verbalized acceptance and understanding.  Screening Tests Health Maintenance  Topic Date Due   Zoster Vaccines- Shingrix (1 of 2) Never done   MAMMOGRAM  12/06/2021   COLONOSCOPY (Pts 45-430yrInsurance coverage will need to be confirmed)  08/30/2022 (Originally 09/18/2017)   INFLUENZA VACCINE  02/21/2022   TETANUS/TDAP  12/25/2027   Pneumonia Vaccine 6572Years old  Completed   DEXA SCAN  Completed   COVID-19 Vaccine  Completed   Hepatitis C Screening  Completed   HPV VACCINES  Aged Out    Health Maintenance  Health Maintenance Due  Topic Date Due   Zoster Vaccines- Shingrix (1 of 2) Never done   MAMMOGRAM  12/06/2021    Colorectal cancer screening: Referral to GI placed 01/17/22. Pt aware the office will call re: appt.  Mammogram status: Ordered declined. Pt provided with contact info and advised to call to schedule appt.   Bone Density status: Ordered declined. Pt provided with contact info and advised to call to schedule appt.  Lung Cancer Screening: (Low Dose CT Chest recommended if Age 542-80ears, 30 pack-year currently smoking OR have quit w/in 15years.) does not qualify.   Lung Cancer Screening Referral: n/a  Additional Screening:  Hepatitis C Screening: does qualify; Completed 08/30/16  Vision Screening: Recommended annual ophthalmology exams for early detection of glaucoma and other disorders of the eye. Is  the patient up to date with their annual eye exam?  No  Who is the provider or what is the name of the office in which the patient attends annual eye exams? N/A If pt is not established with a provider, would they like to be referred to a provider to establish care? No .   Dental Screening: Recommended annual dental exams for proper oral hygiene  Community Resource Referral / Chronic Care Management: CRR required this visit?  No   CCM required this visit?  No      Plan:     I have personally reviewed and noted the following in the patient's chart:   Medical and social history Use of alcohol, tobacco or illicit drugs  Current medications and supplements including opioid prescriptions.  Functional ability and status Nutritional status Physical activity  Advanced directives List of other physicians Hospitalizations, surgeries, and ER visits in previous 12 months Vitals Screenings to include cognitive, depression, and falls Referrals and appointments  In addition, I have reviewed and discussed with patient certain preventive protocols, quality metrics, and best practice recommendations. A written personalized care plan for preventive services as well as general preventive health recommendations were provided to patient.   Due to this being a telephonic visit, the after visit summary with patients personalized plan was offered to patient via mail or my-chart.  Per request, patient was mailed a copy of AVS.  Duard Brady Samoria Fedorko, [place]   01/17/2022   Nurse Notes: none  I have reviewed and agree with [facility] documentation.  Kathlene November, MD

## 2022-02-05 NOTE — Progress Notes (Unsigned)
PROVIDER NOTE: Information contained herein reflects review and annotations entered in association with encounter. Interpretation of such information and data should be left to medically-trained personnel. Information provided to patient can be located elsewhere in the medical record under "Patient Instructions". Document created using STT-dictation technology, any transcriptional errors that may result from process are unintentional.    Patient: Wanda [name]  Service Category: E/M  Provider: Gaspar Cola, MD  DOB: 1946/07/10  DOS: 02/06/2022  Specialty: Interventional Pain Management  MRN: [id number]  Setting: Ambulatory outpatient  PCP: Colon Branch, MD  Type: Established Patient    Referring Provider: Colon Branch, MD  Location: Office  Delivery: Face-to-face     HPI  [name], a 76 y.o. year old female, is here today because of her No primary diagnosis found.. Ms. Ladouceur's primary complain today is No chief complaint on file. Last encounter: My last encounter with her was on 01/11/2022. Pertinent problems: Ms. 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 with possible L3 nerve root compression); Chronic sacroiliac joint pain (Right); Chronic radicular pain of lower extremity (Left) (S1 dermatome); Osteoarthritis of hips (Bilateral); Chronic knee pain (1ry area of Pain) (Bilateral) (L>R); Osteoarthritis of knee (Bilateral); Chronic hip pain (Bilateral) (L>R); Chronic low back pain (Bilateral) (L>R) w/ sciatica (Left); Chronic musculoskeletal pain; Abnormal MRI, lumbar spine (03/20/2018); Spondylosis without myelopathy or radiculopathy, lumbosacral region; DDD (degenerative disc disease), lumbosacral; Chronic low back pain  (Bilateral) (R>L) w/o sciatica; Chronic knee pain (Left); Chronic hip pain (Right); Fibromyalgia; Tricompartment osteoarthritis of knee (Left); Osteoarthritis of knee (Left); Arthropathy of knee (Left); Secondary osteoarthritis of knee (Bilateral); Enthesopathy of knee region (Left); Numbness and tingling of upper extremity (Left); Cervical radiculopathy at C8 (Left); Cervical radiculopathy at C7 (Left); DDD (degenerative disc disease), thoracolumbar; Chronic sacroiliac joint arthropathy (Bilateral); Degenerative joint disease of sacroiliac joint (Bilateral) ([place]); Osteoarthritis of sacroiliac joints (Bilateral) ([place]); Somatic dysfunction of sacroiliac joints (Bilateral); Lumbosacral facet arthropathy (Multilevel) (Bilateral); Grade 1 Anterolisthesis of lumbosacral spine (4-5 mm) (L5/S1); Grade 1  Anterolisthesis of lumbar spine (2-3 mm) (L4/L5); Lumbosacral facet hypertrophy (Multilevel) (Bilateral); and Low back pain of over 3 months duration on their pertinent problem list. Pain Assessment: Severity of   is reported as a  /10. Location:    / . Onset:  . Quality:  . Timing:  . Modifying factor(s):  [name] Vitals:  vitals were not taken for this visit.   Reason for encounter: medication management. ***  Pharmacotherapy Assessment  Analgesic: Hydrocodone/APAP 5/325, 1 tab PO q 8 hrs (15 mg/day of hydrocodone) MME/day: 15 mg/day.   Monitoring: [place] PMP: PDMP reviewed during this encounter.       Pharmacotherapy: No side-effects or adverse reactions reported. Compliance: No problems identified. Effectiveness: Clinically acceptable.  No notes on file  UDS:  Summary  Date Value Ref Range Status  02/21/2021 Note  Final    Comment:    ==================================================================== [employer] Select 13 (MW) ==================================================================== Test                             Result       Flag       Units  Drug Present and Declared for Prescription  Verification   Hydrocodone  687          EXPECTED   ng/mg creat   Hydromorphone                  127          EXPECTED   ng/mg creat   Dihydrocodeine                 90           EXPECTED   ng/mg creat   Norhydrocodone                 1359         EXPECTED   ng/mg creat    Sources of hydrocodone include scheduled prescription medications.    Hydromorphone, dihydrocodeine and norhydrocodone are expected    metabolites of hydrocodone. Hydromorphone and dihydrocodeine are    also available as scheduled prescription medications.  ==================================================================== Test                      Result    Flag   Units      Ref Range   Creatinine              103              mg/dL      >=86 ==================================================================== Declared Medications:  The flagging and interpretation on this report are based on the  following declared medications.  Unexpected results may arise from  inaccuracies in the declared medications.   **Note: The testing scope of this panel includes these medications:   Hydrocodone (Norco)   **Note: The testing scope of this panel does not include the  following reported medications:   Acetaminophen (Norco)  Aspirin  Atorvastatin (Lipitor)  Calcium  Cyclobenzaprine (Flexeril)  Gabapentin (Neurontin)  Hydrochlorothiazide (Hydrodiuril)  Lisinopril (Zestril)  Sertraline (Zoloft)  Vitamin D  Vitamin D2 (Drisdol) ==================================================================== For clinical consultation, please call [phone]. ====================================================================      ROS  Constitutional: Denies any fever or chills Gastrointestinal: No reported hemesis, hematochezia, vomiting, or acute GI distress Musculoskeletal: Denies any acute onset joint swelling, redness, loss of ROM, or weakness Neurological: No reported episodes of acute onset  apraxia, aphasia, dysarthria, agnosia, amnesia, paralysis, loss of coordination, or loss of consciousness  Medication Review  HYDROcodone-acetaminophen, Vitamin D, Vitamin D (Ergocalciferol), atorvastatin, calcium carbonate, cyclobenzaprine, diclofenac Sodium, gabapentin, hydrochlorothiazide, lidocaine, lisinopril, and sertraline  History Review  Allergy: Ms. Blow is allergic to simvastatin. Drug: Ms. Hassebrock  reports no history of drug use. Alcohol:  reports no history of alcohol use. Tobacco:  reports that she has never smoked. She has never used smokeless tobacco. Social: Ms. Mccrumb  reports that she has never smoked. She has never used smokeless tobacco. She reports that she does not drink alcohol and does not use drugs. Medical:  has a past medical history of Allergy, Chronic back pain, Depression, DJD (degenerative joint disease), Hyperlipidemia, Hypertension, Menopause, Musculoskeletal pain (12/07/2015), and TMJ PAIN (05/17/2010). Surgical: Ms. Santillano  has a past surgical history that includes G3 P3 . Family: family history includes Breast cancer in her maternal aunt and sister; Cancer in her brother and sister; Colon cancer in her brother; Diabetes in an other family member; Kidney cancer in her son.  Laboratory Chemistry Profile   Renal Lab Results  Component Value Date   BUN 11 08/30/2021   CREATININE 0.67 08/30/2021   BCR NOT APPLICABLE 01/16/2020   GFR 86.85 08/30/2021  GFRAA 103 02/13/2019   GFRNONAA 89 02/13/2019    Hepatic Lab Results  Component Value Date   AST 22 08/30/2021   ALT 19 08/30/2021   ALBUMIN 4.2 08/30/2021   ALKPHOS 77 08/30/2021   HCVAB NEGATIVE 08/30/2016    Electrolytes Lab Results  Component Value Date   NA 136 08/30/2021   K 4.4 08/30/2021   CL 100 08/30/2021   CALCIUM 9.8 08/30/2021   MG 1.8 02/13/2019    Bone Lab Results  Component Value Date   VD25OH 23.14 (L) 08/30/2021   VD125OH2TOT 68 12/24/2017   [id number] 68 12/24/2017    VD2125OH2 <8 12/24/2017   25OHVITD1 22 (L) 02/13/2019   25OHVITD2 3.4 02/13/2019   25OHVITD3 19 02/13/2019    Inflammation (CRP: Acute Phase) (ESR: Chronic Phase) Lab Results  Component Value Date   CRP 8 02/13/2019   ESRSEDRATE 22 01/16/2020         Note: Above Lab results reviewed.  Recent Imaging Review  MR KNEE LEFT WO CONTRAST CLINICAL DATA:  Knee pain. Progressive worsening. Ongoing for 8 years  EXAM: MRI OF THE LEFT KNEE WITHOUT CONTRAST  TECHNIQUE: Multiplanar, multisequence MR imaging of the knee was performed. No intravenous contrast was administered.  COMPARISON:  None.  FINDINGS: MENISCI  Medial: Maceration of the body and posterior horn of the medial meniscus. Maceration of the anterior horn-body junction of the medial meniscus.  Lateral: Tiny radial tear of the free edge of the body of the lateral meniscus.  LIGAMENTS  Cruciates: Complete chronic ACL tear.  Intact PCL.  Collaterals: Medial collateral ligament is intact. Lateral collateral ligament complex is intact.  CARTILAGE  Patellofemoral: High-grade partial-thickness cartilage loss of the patellofemoral compartment with areas of full-thickness cartilage loss of the lateral patellar facet with subchondral reactive marrow changes.  Medial: Extensive full-thickness cartilage loss of the medial femorotibial compartment with subchondral reactive marrow edema and cystic changes.  Lateral: Partial-thickness cartilage loss of the lateral femorotibial compartment with small areas of full-thickness cartilage loss along the medial aspect of the lateral tibial plateau.  JOINT: No joint effusion. Normal Hoffa's fat-pad. No plical thickening. Two loose bodies posterior to the lateral femoral condyle.  POPLITEAL FOSSA: Popliteus tendon is intact. No Baker's cyst.  EXTENSOR MECHANISM: Intact quadriceps tendon. Intact patellar tendon. Intact lateral patellar retinaculum. Intact medial  patellar retinaculum. Intact MPFL.  BONES: No aggressive osseous lesion. No fracture or dislocation.  Other: No fluid collection or hematoma. Muscles are normal. Small ganglion cyst arising from the proximal tibiofibular joint.  IMPRESSION: 1. Maceration of the body and posterior horn of the medial meniscus. Maceration of the anterior horn-body junction of the medial meniscus. 2. Tiny radial tear of the free edge of the body of the lateral meniscus. 3.  Tricompartmental cartilage abnormalities as described above.  Electronically Signed   By: Kathreen Devoid M.D.   On: 04/22/2021 07:04 Note: Reviewed        Physical Exam  General appearance: Well nourished, well developed, and well hydrated. In no apparent acute distress Mental status: Alert, oriented x 3 (person, place, & time)       Respiratory: No evidence of acute respiratory distress Eyes: PERLA Vitals: There were no vitals taken for this visit. BMI: Estimated body mass index is 39.82 kg/m as calculated from the following:   Height as of 10/17/21: $RemoveBef'5\' 4"'UGAYtrtDuF$  (1.626 m).   Weight as of 10/17/21: 232 lb (105.2 kg). Ideal: Patient weight not recorded  Assessment   Diagnosis Status  1. Chronic pain syndrome   2. Pharmacologic therapy   3. Chronic use of opiate for therapeutic purpose   4. Encounter for medication management   5. Chronic low back pain (Bilateral) (R>L) w/o sciatica   6. Lumbar facet syndrome (Bilateral) (R>L)   7. DDD (degenerative disc disease), thoracolumbar   8. Grade 1 Anterolisthesis of lumbosacral spine (4 mm) (L5/S1)   9. Grade 1  Anterolisthesis of lumbar spine (2-3 mm) (L4/L5)   10. Encounter for chronic pain management   11. Chronic knee pain (1ry area of Pain) (Bilateral) (L>R)   12. Chronic musculoskeletal pain   13. Chronic sacroiliac joint pain (Right)    Controlled Controlled Controlled   Updated Problems: No problems updated.  Plan of Care  Problem-specific:  No problem-specific  Assessment & Plan notes found for this encounter.  Ms. MINERVA BLUETT has a current medication list which includes the following long-term medication(s): atorvastatin, calcium carbonate, cyclobenzaprine, gabapentin, hydrochlorothiazide, hydrocodone-acetaminophen, lisinopril, and sertraline.  Pharmacotherapy (Medications Ordered): No orders of the defined types were placed in this encounter.  Orders:  No orders of the defined types were placed in this encounter.  Follow-up plan:   No follow-ups on file.     Interventional Therapies  Risk  Complexity Considerations:   Estimated body mass index is 38.61 kg/m as calculated from the following:   Height as of this encounter: $RemoveBeforeD'5\' 5"'IlMBWVQZhwbsUT$  (1.651 m).   Weight as of this encounter: 232 lb (105.2 kg). Risk  Complexity Considerations:   NOTE: NO LUMBAR FACET RFA until BMI <35.   Planned  Pending:   Therapeutic bilateral lumbar facet MBB    Under consideration:   Diagnostic left genicular NB #1    Completed:   Palliative IM Toradol/Norflex 60/60 mg (12/01/2020)  Therapeutic right lumbar facet MBB x5 (12/23/2020) (100/100/50/50)  Therapeutic left lumbar facet MBB x3 (12/23/2020) (100/100/75/>75)  Therapeutic right IA steroid knee injection x1 (08/15/2018) (N/A - NP)  Therapeutic left IA steroid knee injection x2 (08/15/2018) (N/A - NP)  Therapeutic left IA Hyalgan knee injection x9 (10/07/2020) (100/100/0/0)  Therapeutic left IA Monovisc knee injection x1 (04/05/2021) (100/100/50/50)    Therapeutic  Palliative (PRN) options:   Therapeutic/palliative lumbar facet MBB       Recent Visits No visits were found meeting these conditions. Showing recent visits within past 90 days and meeting all other requirements Future Appointments Date Type Provider Dept  02/06/22 Appointment Milinda Pointer, MD [facility]  Showing future appointments within next 90 days and meeting all other requirements  I discussed the assessment and  treatment plan with the patient. The patient was provided an opportunity to ask questions and all were answered. The patient agreed with the plan and demonstrated an understanding of the instructions.  Patient advised to call back or seek an in-person evaluation if the symptoms or condition worsens.  Duration of encounter: *** minutes.  Total time on encounter, as per AMA guidelines included both the face-to-face and non-face-to-face time personally spent by the physician and/or other qualified health care professional(s) on the day of the encounter (includes time in activities that require the physician or other qualified health care professional and does not include time in activities normally performed by clinical staff). Physician's time may include the following activities when performed: preparing to see the patient (eg, review of tests, pre-charting review of records) obtaining and/or reviewing separately obtained history performing a medically appropriate examination and/or evaluation counseling and educating the patient/family/caregiver ordering medications, tests, or procedures  referring and communicating with other health care professionals (when not separately reported) documenting clinical information in the electronic or other health record independently interpreting results (not separately reported) and communicating results to the patient/ family/caregiver care coordination (not separately reported)  Note by: Gaspar Cola, MD Date: 02/06/2022; Time: 3:31 PM

## 2022-02-06 ENCOUNTER — Ambulatory Visit: Payer: Medicare HMO | Attending: Pain Medicine | Admitting: Pain Medicine

## 2022-02-06 ENCOUNTER — Encounter: Payer: Self-pay | Admitting: Pain Medicine

## 2022-02-06 VITALS — BP 133/91 | HR 87 | Temp 96.6°F | Resp 16 | Ht 65.0 in | Wt 232.0 lb

## 2022-02-06 DIAGNOSIS — M7918 Myalgia, other site: Secondary | ICD-10-CM | POA: Insufficient documentation

## 2022-02-06 DIAGNOSIS — G8929 Other chronic pain: Secondary | ICD-10-CM | POA: Diagnosis not present

## 2022-02-06 DIAGNOSIS — M5135 Other intervertebral disc degeneration, thoracolumbar region: Secondary | ICD-10-CM | POA: Diagnosis not present

## 2022-02-06 DIAGNOSIS — M47817 Spondylosis without myelopathy or radiculopathy, lumbosacral region: Secondary | ICD-10-CM | POA: Insufficient documentation

## 2022-02-06 DIAGNOSIS — M25562 Pain in left knee: Secondary | ICD-10-CM | POA: Diagnosis present

## 2022-02-06 DIAGNOSIS — M47816 Spondylosis without myelopathy or radiculopathy, lumbar region: Secondary | ICD-10-CM | POA: Diagnosis not present

## 2022-02-06 DIAGNOSIS — Z79891 Long term (current) use of opiate analgesic: Secondary | ICD-10-CM | POA: Diagnosis not present

## 2022-02-06 DIAGNOSIS — M545 Low back pain, unspecified: Secondary | ICD-10-CM | POA: Diagnosis not present

## 2022-02-06 DIAGNOSIS — M533 Sacrococcygeal disorders, not elsewhere classified: Secondary | ICD-10-CM | POA: Insufficient documentation

## 2022-02-06 DIAGNOSIS — G894 Chronic pain syndrome: Secondary | ICD-10-CM | POA: Insufficient documentation

## 2022-02-06 DIAGNOSIS — M4317 Spondylolisthesis, lumbosacral region: Secondary | ICD-10-CM | POA: Diagnosis not present

## 2022-02-06 DIAGNOSIS — Z79899 Other long term (current) drug therapy: Secondary | ICD-10-CM | POA: Insufficient documentation

## 2022-02-06 DIAGNOSIS — M25561 Pain in right knee: Secondary | ICD-10-CM | POA: Insufficient documentation

## 2022-02-06 DIAGNOSIS — M4316 Spondylolisthesis, lumbar region: Secondary | ICD-10-CM | POA: Insufficient documentation

## 2022-02-06 MED ORDER — KETOROLAC TROMETHAMINE 60 MG/2ML IM SOLN
60.0000 mg | Freq: Once | INTRAMUSCULAR | Status: AC
  Administered 2022-02-06: 60 mg via INTRAMUSCULAR
  Filled 2022-02-06: qty 2

## 2022-02-06 MED ORDER — HYDROCODONE-ACETAMINOPHEN 5-325 MG PO TABS: 1.0000 | ORAL_TABLET | Freq: Three times a day (TID) | ORAL | 0 refills | Status: DC | PRN

## 2022-02-06 NOTE — Progress Notes (Signed)
Nursing Pain Medication Assessment:  Safety precautions to be maintained throughout the outpatient stay will include: orient to surroundings, keep bed in low position, maintain call bell within reach at all times, provide assistance with transfer out of bed and ambulation.  Medication Inspection Compliance: Pill count conducted under aseptic conditions, in front of the patient. Neither the pills nor the bottle was removed from the patient's sight at any time. Once count was completed pills were immediately returned to the patient in their original bottle.  Medication: Hydrocodone/APAP Pill/Patch Count:  0 of 90 pills remain Pill/Patch Appearance: Markings consistent with prescribed medication Bottle Appearance: Standard pharmacy container. Clearly labeled. Filled Date: 06 / 10 / 2023 Last Medication intake:  Today

## 2022-02-06 NOTE — Patient Instructions (Signed)

## 2022-02-10 LAB — TOXASSURE SELECT 13 (MW), URINE

## 2022-03-04 ENCOUNTER — Other Ambulatory Visit: Payer: Self-pay | Admitting: Internal Medicine

## 2022-03-06 ENCOUNTER — Ambulatory Visit (INDEPENDENT_AMBULATORY_CARE_PROVIDER_SITE_OTHER): Payer: Medicare HMO | Admitting: Internal Medicine

## 2022-03-06 ENCOUNTER — Encounter: Payer: Self-pay | Admitting: Internal Medicine

## 2022-03-06 VITALS — BP 132/86 | HR 76 | Temp 98.5°F | Resp 16 | Ht 65.0 in | Wt 233.2 lb

## 2022-03-06 DIAGNOSIS — E785 Hyperlipidemia, unspecified: Secondary | ICD-10-CM | POA: Diagnosis not present

## 2022-03-06 DIAGNOSIS — Z0000 Encounter for general adult medical examination without abnormal findings: Secondary | ICD-10-CM | POA: Diagnosis not present

## 2022-03-06 DIAGNOSIS — E559 Vitamin D deficiency, unspecified: Secondary | ICD-10-CM | POA: Diagnosis not present

## 2022-03-06 DIAGNOSIS — Z780 Asymptomatic menopausal state: Secondary | ICD-10-CM | POA: Diagnosis not present

## 2022-03-06 DIAGNOSIS — Z23 Encounter for immunization: Secondary | ICD-10-CM

## 2022-03-06 DIAGNOSIS — I10 Essential (primary) hypertension: Secondary | ICD-10-CM | POA: Diagnosis not present

## 2022-03-06 DIAGNOSIS — Z1231 Encounter for screening mammogram for malignant neoplasm of breast: Secondary | ICD-10-CM

## 2022-03-06 LAB — LIPID PANEL
Cholesterol: 218 mg/dL — ABNORMAL HIGH (ref 0–200)
HDL: 54.7 mg/dL (ref >39.00)
LDL Cholesterol: 151 mg/dL — ABNORMAL HIGH (ref 0–99)
NonHDL: 162.93
Total CHOL/HDL Ratio: 4
Triglycerides: 62 mg/dL (ref 0.0–149.0)
VLDL: 12.4 mg/dL (ref 0.0–40.0)

## 2022-03-06 LAB — CBC WITH DIFFERENTIAL/PLATELET
Basophils Absolute: 0 10*3/uL (ref 0.0–0.1)
Basophils Relative: 0.8 % (ref 0.0–3.0)
Eosinophils Absolute: 0.1 10*3/uL (ref 0.0–0.7)
Eosinophils Relative: 2.2 % (ref 0.0–5.0)
HCT: 40.2 % (ref 36.0–46.0)
Hemoglobin: 13.3 g/dL (ref 12.0–15.0)
Lymphocytes Relative: 39.3 % (ref 12.0–46.0)
Lymphs Abs: 1.9 10*3/uL (ref 0.7–4.0)
MCHC: 33.1 g/dL (ref 30.0–36.0)
MCV: 96.5 fl (ref 78.0–100.0)
Monocytes Absolute: 0.4 10*3/uL (ref 0.1–1.0)
Monocytes Relative: 8.9 % (ref 3.0–12.0)
Neutro Abs: 2.3 10*3/uL (ref 1.4–7.7)
Neutrophils Relative %: 48.8 % (ref 43.0–77.0)
Platelets: 178 10*3/uL (ref 150.0–400.0)
RBC: 4.17 Mil/uL (ref 3.87–5.11)
RDW: 15.1 % (ref 11.5–15.5)
WBC: 4.8 10*3/uL (ref 4.0–10.5)

## 2022-03-06 LAB — BASIC METABOLIC PANEL
BUN: 12 mg/dL (ref 6–23)
CO2: 28 mEq/L (ref 19–32)
Calcium: 9.8 mg/dL (ref 8.4–10.5)
Chloride: 101 mEq/L (ref 96–112)
Creatinine, Ser: 0.68 mg/dL (ref 0.40–1.20)
GFR: 86.22 mL/min (ref >60.00)
Glucose, Bld: 95 mg/dL (ref 70–99)
Potassium: 3.9 mEq/L (ref 3.5–5.1)
Sodium: 137 mEq/L (ref 135–145)

## 2022-03-06 LAB — VITAMIN D 25 HYDROXY (VIT D DEFICIENCY, FRACTURES): VITD: 22.03 ng/mL — ABNORMAL LOW (ref 30.00–100.00)

## 2022-03-06 MED ORDER — SHINGRIX 50 MCG/0.5ML IM SUSR: 0.5000 mL | Freq: Once | INTRAMUSCULAR | 1 refills | Status: AC

## 2022-03-06 NOTE — Assessment & Plan Note (Signed)
Here for CPX HTN: Seems controlled, continue Lisinopril, HCTZ.  Check BMP High cholesterol: On Lipitor 40 mg, last LDL elevated, recheck today.  Encourage compliance and a healthy diet. Depression-anxiety: At the last visit, sertraline was increased to 100 mg tablets: Twice daily.  She is taking 1 tablet most days, occasional 2.  Overall feels better.    Plan: 100 mg twice daily every day. Vitamin D deficiency: Encourage daily vitamin D, checking labs Pain management: Per pain medicine. RTC 4 months.

## 2022-03-06 NOTE — Assessment & Plan Note (Signed)
-   Td 2019 -PNM 23 2016; PNM 13 2015; PNM 20: today - zostavax: 2014 -Shingrix: rec again  -Covid vax x 3, rec #4 - flu shot q year   -Female care:  Last PAP 2017, previous referral to gyn failed x 2 , rec to reach out to her gyn office(has no vag bleed or d/c)  MMG 11/2020 per [facility] , referral sent  -CCS: cscope 2014, due for a repeat cscope, referred to GI recently, rec to call GI directly. -DEXA  2012: Normal (and  improved compared to 2009 per report).  DEXA referral failed late last year, will try again -Labs: BMP, FLP, vitamin D, CBC -Diet: Discussed - POA discussed

## 2022-03-06 NOTE — Progress Notes (Signed)
Subjective:    Patient ID: Wanda [name], female    DOB: November 13, 1945, 76 y.o.   MRN: [id number]  DOS:  03/06/2022 Type of visit - description: Here for CPX  Here for CPX. No new concerns. Has chronic pain, managed elsewhere. At the last visit, sertraline dose adjusted.  Emotionally feels better  Review of Systems  Other than above, a 14 point review of systems is negative       Past Medical History:  Diagnosis Date   Allergy    RHINITIS   Chronic back pain    Depression    DJD (degenerative joint disease)    back pain , on disability   Hyperlipidemia    Hypertension    Menopause    onset age 86    Musculoskeletal pain 12/07/2015   TMJ PAIN 05/17/2010   Qualifier: Diagnosis of  By: Larose Kells MD, [place]     Past Surgical History:  Procedure Laterality Date   G3 P3      Social History   Socioeconomic History   Marital status: Married    Spouse name: Not on file   Number of children: 3   Years of education: Not on file   Highest education level: Not on file  Occupational History   Occupation: ON DISABILITY  Tobacco Use   Smoking status: Never   Smokeless tobacco: Never  Vaping Use   Vaping Use: Never used  Substance and Sexual Activity   Alcohol use: No   Drug use: No   Sexual activity: Not Currently  Other Topics Concern   Not on file  Social History Narrative   Married, 3 children (2 sons, a daughter), 6 G-kids , 5 g-g-children   Household:  pt, husband        Social Determinants of Radio broadcast assistant Strain: Low Risk  (01/17/2022)   Overall Financial Resource Strain (CARDIA)    Difficulty of Paying Living Expenses: Not hard at all  Food Insecurity: No Food Insecurity (01/17/2022)   Hunger Vital Sign    Worried About Running Out of Food in the Last Year: Never true    Ran Out of Food in the Last Year: Never true  Transportation Needs: No Transportation Needs (01/17/2022)   PRAPARE - Hydrologist (Medical): No     Lack of Transportation (Non-Medical): No  Physical Activity: Inactive (01/17/2022)   Exercise Vital Sign    Days of Exercise per Week: 0 days    Minutes of Exercise per Session: 0 min  Stress: No Stress Concern Present (01/17/2022)   [place]    Feeling of Stress : Not at all  Social Connections: Moderately Integrated (01/17/2022)   Social Connection and Isolation Panel [NHANES]    Frequency of Communication with Friends and Family: Twice a week    Frequency of Social Gatherings with Friends and Family: More than three times a week    Attends Religious Services: More than 4 times per year    Active Member of [employer] or Organizations: No    Attends Archivist Meetings: Never    Marital Status: Married  Human resources officer Violence: Not At Risk (01/17/2022)   Humiliation, Afraid, Rape, and Kick questionnaire    Fear of Current or Ex-Partner: No    Emotionally Abused: No    Physically Abused: No    Sexually Abused: No    Current Outpatient Medications  Medication Instructions  atorvastatin (LIPITOR) 40 mg, Oral, Daily   calcium carbonate (CALCIUM 600) 600 mg, Oral, 2 times daily with meals   cyclobenzaprine (FLEXERIL) 5 mg, Oral, 3 times daily PRN   diclofenac Sodium (VOLTAREN) 2 g, Topical, 4 times daily   gabapentin (NEURONTIN) 300 mg, Oral, 3 times daily   hydrochlorothiazide (HYDRODIURIL) 25 MG tablet TAKE 1 TABLET(25 MG) BY MOUTH DAILY   HYDROcodone-acetaminophen (NORCO/VICODIN) 5-325 MG tablet 1 tablet, Oral, 3 times daily PRN, Must last 30 days   [START ON 03/08/2022] HYDROcodone-acetaminophen (NORCO/VICODIN) 5-325 MG tablet 1 tablet, Oral, 3 times daily PRN, Must last 30 days   [START ON 04/07/2022] HYDROcodone-acetaminophen (NORCO/VICODIN) 5-325 MG tablet 1 tablet, Oral, 3 times daily PRN, Must last 30 days   lisinopril (ZESTRIL) 10 MG tablet TAKE 1 TABLET(10 MG) BY MOUTH DAILY   sertraline (ZOLOFT) 200  mg, Oral, Daily   Vitamin D 2,000 Units, Oral, Daily   Zoster Vaccine Adjuvanted (SHINGRIX) injection 0.5 mLs, Intramuscular,  Once       Objective:   Physical Exam BP 132/86   Pulse 76   Temp 98.5 F (36.9 C) (Oral)   Resp 16   Ht '5\' 5"'$  (1.651 m)   Wt 233 lb 4 oz (105.8 kg)   SpO2 97%   BMI 38.81 kg/m  General: Well developed, NAD, BMI noted Neck: No  thyromegaly  HEENT:  Normocephalic . Face symmetric, atraumatic Lungs:  CTA B Normal respiratory effort, no intercostal retractions, no accessory muscle use. Heart: RRR,  no murmur.  Abdomen:  Not distended, soft, non-tender. No rebound or rigidity.   Lower extremities: no pretibial edema bilaterally  Skin: Exposed areas without rash. Not pale. Not jaundice Neurologic:  alert & oriented X3.  Speech normal, gait assisted with a cane, transferring slow. Strength symmetric and appropriate for age.  Psych: Cognition and judgment appear intact.  Cooperative with normal attention span and concentration.  Behavior appropriate. No anxious or depressed appearing.     Assessment     Assessment HTN Hyperlipidemia Depression Morbid obesity MSK: DJD, chronic back-knee pain, hydrocodone /gabapentin rx by DR Shelly Rubenstein D  Def  PLAN:  Here for CPX HTN: Seems controlled, continue Lisinopril, HCTZ.  Check BMP High cholesterol: On Lipitor 40 mg, last LDL elevated, recheck today.  Encourage compliance and a healthy diet. Depression-anxiety: At the last visit, sertraline was increased to 100 mg tablets: Twice daily.  She is taking 1 tablet most days, occasional 2.  Overall feels better.    Plan: 100 mg twice daily every day. Vitamin D deficiency: Encourage daily vitamin D, checking labs Pain management: Per pain medicine. RTC 4 months.

## 2022-03-06 NOTE — Patient Instructions (Addendum)
Take medications as prescribed  Sertraline 100 mg 2 tablets a day (together or 1 in the morning and 1 at night, your choice).  Take vitamin D3 over-the-counter: 2000 units every day  Recommend to proceed with the following vaccines at your pharmacy:  Shingrix (shingles) Covid booster (bivalent) Flu shot this fall  Please read the package of information regards healthcare power of attorney  Check the  blood pressure regularly BP GOAL is between 110/65 and  135/85. If it is consistently higher or lower, let me know    GO TO THE LAB : Get the blood work     [place], [place] back for a checkup in 4 months

## 2022-03-08 MED ORDER — VITAMIN D (ERGOCALCIFEROL) 1.25 MG (50000 UNIT) PO CAPS: 50000.0000 [IU] | ORAL_CAPSULE | ORAL | 0 refills | Status: DC

## 2022-03-08 MED ORDER — ATORVASTATIN CALCIUM 80 MG PO TABS: 80.0000 mg | ORAL_TABLET | Freq: Every day | ORAL | 1 refills | Status: DC

## 2022-03-08 NOTE — Addendum Note (Signed)
Addended byDamita Dunnings D on: 03/08/2022 09:50 AM   Modules accepted: Orders

## 2022-04-06 ENCOUNTER — Encounter: Payer: Self-pay | Admitting: Internal Medicine

## 2022-04-27 ENCOUNTER — Telehealth: Payer: Self-pay | Admitting: Pain Medicine

## 2022-04-27 NOTE — Telephone Encounter (Signed)
It would be best for her to ask him on Monday. MD does not do orders for PRN procedures without an appointment.

## 2022-04-27 NOTE — Telephone Encounter (Signed)
Pateint is asking if she can get injection into her knee, left sided, on Monday when she comes in for Med Mgmt appt., if not she will talk to him about it on Monday

## 2022-04-30 NOTE — Progress Notes (Unsigned)
PROVIDER NOTE: Information contained herein reflects review and annotations entered in association with encounter. Interpretation of such information and data should be left to medically-trained personnel. Information provided to patient can be located elsewhere in the medical record under "Patient Instructions". Document created using STT-dictation technology, any transcriptional errors that may result from process are unintentional.    Patient: Wanda [name]  Service Category: E/M  Provider: Gaspar Cola, MD  DOB: 1946/05/23  DOS: 05/01/2022  Referring Provider: Colon Branch, MD  MRN: [id number]  Specialty: Interventional Pain Management  PCP: Colon Branch, MD  Type: Established Patient  Setting: Ambulatory outpatient    Location: Office  Delivery: Face-to-face     HPI  [name], a 76 y.o. year old female, is here today because of her No primary diagnosis found.. Ms. Arnone's primary complain today is No chief complaint on file. Last encounter: My last encounter with her was on 04/27/2022. Pertinent problems: Ms. 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 with possible L3 nerve root compression); Chronic sacroiliac joint pain (Right); Chronic radicular pain of lower extremity (Left) (S1 dermatome); Osteoarthritis of hips (Bilateral); Chronic knee pain (1ry area of Pain) (Bilateral) (L>R); Osteoarthritis of knee (Bilateral); Chronic hip pain (Bilateral) (L>R); Chronic low back pain (Bilateral) (L>R) w/ sciatica (Left); Chronic musculoskeletal pain; Abnormal MRI, lumbar spine (03/20/2018); Spondylosis without myelopathy or radiculopathy, lumbosacral region; DDD (degenerative disc disease), lumbosacral; Chronic low back pain  (Bilateral) (R>L) w/o sciatica; Chronic knee pain (Left); Chronic hip pain (Right); Fibromyalgia; Tricompartment osteoarthritis of knee (Left); Osteoarthritis of knee (Left); Arthropathy of knee (Left); Secondary osteoarthritis of knee (Bilateral); Enthesopathy of knee region (Left); Numbness and tingling of upper extremity (Left); Cervical radiculopathy at C8 (Left); Cervical radiculopathy at C7 (Left); DDD (degenerative disc disease), thoracolumbar; Chronic sacroiliac joint arthropathy (Bilateral); Degenerative joint disease of sacroiliac joint (Bilateral) ([place]); Osteoarthritis of sacroiliac joints (Bilateral) ([place]); Somatic dysfunction of sacroiliac joints (Bilateral); Lumbosacral facet arthropathy (Multilevel) (Bilateral); Grade 1 Anterolisthesis of lumbosacral spine (4-5 mm) (L5/S1); Grade 1  Anterolisthesis of lumbar spine (2-3 mm) (L4/L5); Lumbosacral facet hypertrophy (Multilevel) (Bilateral); and Low back pain of over 3 months duration on their pertinent problem list. Pain Assessment: Severity of   is reported as a  /10. Location:    / . Onset:  . Quality:  . Timing:  . Modifying factor(s):  [name] Vitals:  vitals were not taken for this visit.   Reason for encounter:  *** . ***  Pharmacotherapy Assessment  Analgesic: Hydrocodone/APAP 5/325, 1 tab PO q 8 hrs (15 mg/day of hydrocodone) MME/day: 15 mg/day.   Monitoring: [place] PMP: PDMP reviewed during this encounter.       Pharmacotherapy: No side-effects or adverse reactions reported. Compliance: No problems identified. Effectiveness: Clinically acceptable.  No notes on file  No results found for: "CBDTHCR" No results found for: "D8THCCBX" No results found for: "D9THCCBX"  UDS:  Summary  Date Value Ref Range Status  02/06/2022 Note  Final    Comment:    ==================================================================== [employer] [facility] (MW) ==================================================================== Test                              Result       Flag       Units  Drug Present and Declared for Prescription Verification   Hydrocodone                    386          EXPECTED   ng/mg creat   Hydromorphone                  147          EXPECTED   ng/mg creat   Norhydrocodone                 856          EXPECTED   ng/mg creat    Sources of hydrocodone include scheduled prescription medications.    Hydromorphone and norhydrocodone are expected metabolites of    hydrocodone. Hydromorphone is also available as a scheduled    prescription medication.  ==================================================================== Test                      Result    Flag   Units      Ref Range   Creatinine              133              mg/dL      >=20 ==================================================================== Declared Medications:  The flagging and interpretation on this report are based on the  following declared medications.  Unexpected results may arise from  inaccuracies in the declared medications.   **Note: The testing scope of this panel includes these medications:   Hydrocodone (Norco)   **Note: The testing scope of this panel does not include the  following reported medications:   Acetaminophen (Norco)  Atorvastatin (Lipitor)  Calcium  Cyclobenzaprine (Flexeril)  Gabapentin (Neurontin)  Hydrochlorothiazide (Hydrodiuril)  Lisinopril (Zestril)  Sertraline (Zoloft)  Topical Diclofenac (Voltaren)  Topical Lidocaine (Lidoderm)  Vitamin D  Vitamin D2 (Drisdol) ==================================================================== For clinical consultation, please call [phone]. ====================================================================       ROS  Constitutional: Denies any fever or chills Gastrointestinal: No reported hemesis, hematochezia, vomiting, or acute GI distress Musculoskeletal: Denies any acute onset joint swelling, redness, loss of ROM, or weakness Neurological: No  reported episodes of acute onset apraxia, aphasia, dysarthria, agnosia, amnesia, paralysis, loss of coordination, or loss of consciousness  Medication Review  HYDROcodone-acetaminophen, Vitamin D, Vitamin D (Ergocalciferol), atorvastatin, calcium carbonate, cyclobenzaprine, diclofenac Sodium, gabapentin, hydrochlorothiazide, lisinopril, and sertraline  History Review  Allergy: Ms. Larmon is allergic to simvastatin. Drug: Ms. Leveille  reports no history of drug use. Alcohol:  reports no history of alcohol use. Tobacco:  reports that she has never smoked. She has never used smokeless tobacco. Social: Ms. Tunks  reports that she has never smoked. She has never used smokeless tobacco. She reports that she does not drink alcohol and does not use drugs. Medical:  has a past medical history of Allergy, Chronic back pain, Depression, DJD (degenerative joint disease), Hyperlipidemia, Hypertension, Menopause, Musculoskeletal pain (12/07/2015), and TMJ PAIN (05/17/2010). Surgical: Ms. Humbarger  has a past surgical history that includes G3 P3 . Family: family history includes Breast cancer in her maternal aunt and sister; Cancer in her brother and sister; Colon cancer in her brother; Diabetes in an other family member; Kidney cancer in her son.  Laboratory Chemistry Profile   Renal Lab Results  Component Value Date   BUN 12 03/06/2022   CREATININE 0.68 25/00/3704   BCR NOT APPLICABLE 88/89/1694   GFR 86.22 03/06/2022   GFRAA  103 02/13/2019   GFRNONAA 89 02/13/2019    Hepatic Lab Results  Component Value Date   AST 22 08/30/2021   ALT 19 08/30/2021   ALBUMIN 4.2 08/30/2021   ALKPHOS 77 08/30/2021   HCVAB NEGATIVE 08/30/2016    Electrolytes Lab Results  Component Value Date   NA 137 03/06/2022   K 3.9 03/06/2022   CL 101 03/06/2022   CALCIUM 9.8 03/06/2022   MG 1.8 02/13/2019    Bone Lab Results  Component Value Date   VD25OH 22.03 (L) 03/06/2022   VD125OH2TOT 68 12/24/2017    [id number] 68 12/24/2017   VD2125OH2 <8 12/24/2017   25OHVITD1 22 (L) 02/13/2019   25OHVITD2 3.4 02/13/2019   25OHVITD3 19 02/13/2019    Inflammation (CRP: Acute Phase) (ESR: Chronic Phase) Lab Results  Component Value Date   CRP 8 02/13/2019   ESRSEDRATE 22 01/16/2020         Note: Above Lab results reviewed.  Recent Imaging Review  MR KNEE LEFT WO CONTRAST CLINICAL DATA:  Knee pain. Progressive worsening. Ongoing for 8 years  EXAM: MRI OF THE LEFT KNEE WITHOUT CONTRAST  TECHNIQUE: Multiplanar, multisequence MR imaging of the knee was performed. No intravenous contrast was administered.  COMPARISON:  None.  FINDINGS: MENISCI  Medial: Maceration of the body and posterior horn of the medial meniscus. Maceration of the anterior horn-body junction of the medial meniscus.  Lateral: Tiny radial tear of the free edge of the body of the lateral meniscus.  LIGAMENTS  Cruciates: Complete chronic ACL tear.  Intact PCL.  Collaterals: Medial collateral ligament is intact. Lateral collateral ligament complex is intact.  CARTILAGE  Patellofemoral: High-grade partial-thickness cartilage loss of the patellofemoral compartment with areas of full-thickness cartilage loss of the lateral patellar facet with subchondral reactive marrow changes.  Medial: Extensive full-thickness cartilage loss of the medial femorotibial compartment with subchondral reactive marrow edema and cystic changes.  Lateral: Partial-thickness cartilage loss of the lateral femorotibial compartment with small areas of full-thickness cartilage loss along the medial aspect of the lateral tibial plateau.  JOINT: No joint effusion. Normal Hoffa's fat-pad. No plical thickening. Two loose bodies posterior to the lateral femoral condyle.  POPLITEAL FOSSA: Popliteus tendon is intact. No Baker's cyst.  EXTENSOR MECHANISM: Intact quadriceps tendon. Intact patellar tendon. Intact lateral patellar  retinaculum. Intact medial patellar retinaculum. Intact MPFL.  BONES: No aggressive osseous lesion. No fracture or dislocation.  Other: No fluid collection or hematoma. Muscles are normal. Small ganglion cyst arising from the proximal tibiofibular joint.  IMPRESSION: 1. Maceration of the body and posterior horn of the medial meniscus. Maceration of the anterior horn-body junction of the medial meniscus. 2. Tiny radial tear of the free edge of the body of the lateral meniscus. 3.  Tricompartmental cartilage abnormalities as described above.  Electronically Signed   By: Kathreen Devoid M.D.   On: 04/22/2021 07:04 Note: Reviewed        Physical Exam  General appearance: Well nourished, well developed, and well hydrated. In no apparent acute distress Mental status: Alert, oriented x 3 (person, place, & time)       Respiratory: No evidence of acute respiratory distress Eyes: PERLA Vitals: There were no vitals taken for this visit. BMI: Estimated body mass index is 38.81 kg/m as calculated from the following:   Height as of 03/06/22: $RemoveBef'5\' 5"'hXiEFtzQpd$  (1.651 m).   Weight as of 03/06/22: 233 lb 4 oz (105.8 kg). Ideal: Patient weight not recorded  Assessment   Diagnosis Status  No diagnosis found. Controlled Controlled Controlled   Updated Problems: No problems updated.   Plan of Care  Problem-specific:  No problem-specific Assessment & Plan notes found for this encounter.  Ms. JOELLE ROSWELL has a current medication list which includes the following long-term medication(s): atorvastatin, calcium carbonate, cyclobenzaprine, gabapentin, hydrochlorothiazide, hydrocodone-acetaminophen, hydrocodone-acetaminophen, hydrocodone-acetaminophen, lisinopril, and sertraline.  Pharmacotherapy (Medications Ordered): No orders of the defined types were placed in this encounter.  Orders:  No orders of the defined types were placed in this encounter.  Follow-up plan:   No follow-ups on file.      Interventional Therapies  Risk  Complexity Considerations:   Estimated body mass index is 38.61 kg/m as calculated from the following:   Height as of this encounter: $RemoveBeforeD'5\' 5"'fUmUSqNeSBGERa$  (1.651 m).   Weight as of this encounter: 232 lb (105.2 kg). Risk  Complexity Considerations:   NOTE: NO LUMBAR FACET RFA until BMI <35.   Planned  Pending:   Therapeutic bilateral lumbar facet MBB    Under consideration:   Diagnostic left genicular NB #1    Completed:   Palliative IM Toradol/Norflex 60/60 mg (12/01/20, 02/06/22)  Therapeutic right lumbar facet MBB x5 (12/23/2020) (100/100/50/50)  Therapeutic left lumbar facet MBB x3 (12/23/2020) (100/100/75/>75)  Therapeutic right IA steroid knee injection x1 (08/15/2018) (N/A - NP)  Therapeutic left IA steroid knee injection x2 (08/15/2018) (N/A - NP)  Therapeutic left IA Hyalgan knee injection x9 (10/07/2020) (100/100/0/0)  Therapeutic left IA Monovisc knee injection x1 (04/05/2021) (100/100/50/50)    Therapeutic  Palliative (PRN) options:   Therapeutic/palliative left lumbar facet MBB       Recent Visits Date Type Provider Dept  02/06/22 Office Visit Milinda Pointer, MD [facility]  Showing recent visits within past 90 days and meeting all other requirements Future Appointments Date Type Provider Dept  05/01/22 Appointment Milinda Pointer, MD [facility]  Showing future appointments within next 90 days and meeting all other requirements  I discussed the assessment and treatment plan with the patient. The patient was provided an opportunity to ask questions and all were answered. The patient agreed with the plan and demonstrated an understanding of the instructions.  Patient advised to call back or seek an in-person evaluation if the symptoms or condition worsens.  Duration of encounter: *** minutes.  Total time on encounter, as per AMA guidelines included both the face-to-face and non-face-to-face time personally spent by the  physician and/or other qualified health care professional(s) on the day of the encounter (includes time in activities that require the physician or other qualified health care professional and does not include time in activities normally performed by clinical staff). Physician's time may include the following activities when performed: preparing to see the patient (eg, review of tests, pre-charting review of records) obtaining and/or reviewing separately obtained history performing a medically appropriate examination and/or evaluation counseling and educating the patient/family/caregiver ordering medications, tests, or procedures referring and communicating with other health care professionals (when not separately reported) documenting clinical information in the electronic or other health record independently interpreting results (not separately reported) and communicating results to the patient/ family/caregiver care coordination (not separately reported)  Note by: Gaspar Cola, MD Date: 05/01/2022; Time: 4:32 PM

## 2022-05-01 ENCOUNTER — Ambulatory Visit: Payer: Medicare HMO | Attending: Pain Medicine | Admitting: Pain Medicine

## 2022-05-01 ENCOUNTER — Encounter: Payer: Self-pay | Admitting: Pain Medicine

## 2022-05-01 VITALS — BP 128/83 | HR 80 | Temp 97.5°F | Resp 18 | Ht 64.0 in | Wt 230.0 lb

## 2022-05-01 DIAGNOSIS — M533 Sacrococcygeal disorders, not elsewhere classified: Secondary | ICD-10-CM

## 2022-05-01 DIAGNOSIS — M47816 Spondylosis without myelopathy or radiculopathy, lumbar region: Secondary | ICD-10-CM | POA: Diagnosis not present

## 2022-05-01 DIAGNOSIS — M5135 Other intervertebral disc degeneration, thoracolumbar region: Secondary | ICD-10-CM | POA: Diagnosis not present

## 2022-05-01 DIAGNOSIS — M25562 Pain in left knee: Secondary | ICD-10-CM | POA: Diagnosis not present

## 2022-05-01 DIAGNOSIS — M1712 Unilateral primary osteoarthritis, left knee: Secondary | ICD-10-CM

## 2022-05-01 DIAGNOSIS — Z79899 Other long term (current) drug therapy: Secondary | ICD-10-CM | POA: Diagnosis not present

## 2022-05-01 DIAGNOSIS — G8929 Other chronic pain: Secondary | ICD-10-CM | POA: Diagnosis not present

## 2022-05-01 DIAGNOSIS — G894 Chronic pain syndrome: Secondary | ICD-10-CM

## 2022-05-01 DIAGNOSIS — Z79891 Long term (current) use of opiate analgesic: Secondary | ICD-10-CM | POA: Diagnosis not present

## 2022-05-01 DIAGNOSIS — M4316 Spondylolisthesis, lumbar region: Secondary | ICD-10-CM | POA: Diagnosis not present

## 2022-05-01 DIAGNOSIS — M545 Low back pain, unspecified: Secondary | ICD-10-CM | POA: Diagnosis not present

## 2022-05-01 DIAGNOSIS — M7918 Myalgia, other site: Secondary | ICD-10-CM

## 2022-05-01 DIAGNOSIS — M25561 Pain in right knee: Secondary | ICD-10-CM | POA: Diagnosis not present

## 2022-05-01 DIAGNOSIS — M4317 Spondylolisthesis, lumbosacral region: Secondary | ICD-10-CM | POA: Diagnosis not present

## 2022-05-01 MED ORDER — HYDROCODONE-ACETAMINOPHEN 5-325 MG PO TABS: 1.0000 | ORAL_TABLET | Freq: Three times a day (TID) | ORAL | 0 refills | Status: DC | PRN

## 2022-05-01 MED ORDER — NALOXONE HCL 4 MG/0.1ML NA LIQD: 1.0000 | NASAL | 0 refills | Status: AC | PRN

## 2022-05-01 NOTE — Patient Instructions (Signed)
Naloxone Nasal Spray What is this medication? NALOXONE (nal OX one) treats opioid overdose, which causes slow or shallow breathing, severe drowsiness, or trouble staying awake. Call emergency services after using this medication. You may need additional treatment. Naloxone works by reversing the effects of opioids. It belongs to a group of medications called opioid blockers. This medicine may be used for other purposes; ask your health care provider or pharmacist if you have questions. COMMON BRAND NAME(S): Kloxxado, Narcan What should I tell my care team before I take this medication? They need to know if you have any of these conditions: Heart disease Substance use disorder An unusual or allergic reaction to naloxone, other medications, foods, dyes, or preservatives Pregnant or trying to get pregnant Breast-feeding How should I use this medication? This medication is for use in the nose. Lay the person on their back. Support their neck with your hand and allow the head to tilt back before giving the medication. The nasal spray should be given into 1 nostril. After giving the medication, move the person onto their side. Do not remove or test the nasal spray until ready to use. Get emergency medical help right away after giving the first dose of this medication, even if the person wakes up. You should be familiar with how to recognize the signs and symptoms of a narcotic overdose. If more doses are needed, give the additional dose in the other nostril. Talk to your care team about the use of this medication in children. While this medication may be prescribed for children as young as newborns for selected conditions, precautions do apply. Overdosage: If you think you have taken too much of this medicine contact a poison control center or emergency room at once. NOTE: This medicine is only for you. Do not share this medicine with others. What if I miss a dose? This does not apply. What may  interact with this medication? This is only used during an emergency. No interactions are expected during emergency use. This list may not describe all possible interactions. Give your health care provider a list of all the medicines, herbs, non-prescription drugs, or dietary supplements you use. Also tell them if you smoke, drink alcohol, or use illegal drugs. Some items may interact with your medicine. What should I watch for while using this medication? Keep this medication ready for use in the case of an opioid overdose. Make sure that you have the phone number of your care team and local hospital ready. You may need to have additional doses of this medication. Each nasal spray contains a single dose. Some emergencies may require additional doses. After use, bring the treated person to the nearest hospital or call 911. Make sure the treating care team knows that the person has received a dose of this medication. You will receive additional instructions on what to do during and after use of this medication before an emergency occurs. What side effects may I notice from receiving this medication? Side effects that you should report to your care team as soon as possible: Allergic reactions--skin rash, itching, hives, swelling of the face, lips, tongue, or throat Side effects that usually do not require medical attention (report these to your care team if they continue or are bothersome): Constipation Dryness or irritation inside the nose Headache Increase in blood pressure Muscle spasms Stuffy nose Toothache This list may not describe all possible side effects. Call your doctor for medical advice about side effects. You may report side effects to FDA  at 1-800-FDA-1088. Where should I keep my medication? Keep out of the reach of children and pets. Store between 20 and 25 degrees C (68 and 77 degrees F). Do not freeze. Throw away any unused medication after the expiration date. Keep in original  box until ready to use. NOTE: This sheet is a summary. It may not cover all possible information. If you have questions about this medicine, talk to your doctor, pharmacist, or health care provider.  2023 Elsevier/Gold Standard (2021-06-06 00:00:00) ____________________________________________________________________________________________  Medication Rules  Purpose: To inform patients, and their family members, of our rules and regulations.  Applies to: All patients receiving prescriptions (written or electronic).  Pharmacy of record: Pharmacy where electronic prescriptions will be sent. If written prescriptions are taken to a different pharmacy, please inform the nursing staff. The pharmacy listed in the electronic medical record should be the one where you would like electronic prescriptions to be sent.  Electronic prescriptions: In compliance with the [place] (STOP) Act of 2017 (Session Lanny [place]amp 737 [id number]), effective July 24, 2018, all controlled substances must be electronically prescribed. Calling prescriptions to the pharmacy will cease to exist.  Prescription refills: Only during scheduled appointments. Applies to all prescriptions.  NOTE: The following applies primarily to controlled substances (Opioid* Pain Medications).   Type of encounter (visit): For patients receiving controlled substances, face-to-face visits are required. (Not an option or up to the patient.)  Patient's responsibilities: Pain Pills: Bring all pain pills to every appointment (except for procedure appointments). Pill Bottles: Bring pills in original pharmacy bottle. Always bring the newest bottle. Bring bottle, even if empty. Medication refills: You are responsible for knowing and keeping track of what medications you take and those you need refilled. The day before your appointment: write a list of all prescriptions that need to be refilled. The day of the  appointment: give the list to the admitting nurse. Prescriptions will be written only during appointments. No prescriptions will be written on procedure days. If you forget a medication: it will not be "Called in", "Faxed", or "electronically sent". You will need to get another appointment to get these prescribed. No early refills. Do not call asking to have your prescription filled early. Prescription Accuracy: You are responsible for carefully inspecting your prescriptions before leaving our office. Have the discharge nurse carefully go over each prescription with you, before taking them home. Make sure that your name is accurately spelled, that your address is correct. Check the name and dose of your medication to make sure it is accurate. Check the number of pills, and the written instructions to make sure they are clear and accurate. Make sure that you are given enough medication to last until your next medication refill appointment. Taking Medication: Take medication as prescribed. When it comes to controlled substances, taking less pills or less frequently than prescribed is permitted and encouraged. Never take more pills than instructed. Never take medication more frequently than prescribed.  Inform other Doctors: Always inform, all of your healthcare providers, of all the medications you take. Pain Medication from other Providers: You are not allowed to accept any additional pain medication from any other Doctor or Healthcare provider. There are two exceptions to this rule. (see below) In the event that you require additional pain medication, you are responsible for notifying us, as stated below. Cough Medicine: Often these contain an opioid, such as codeine or hydrocodone. Never accept or take cough medicine containing these opioids if you are already taking  an opioid* medication. The combination may cause respiratory failure and death. Medication Agreement: You are responsible for carefully  reading and following our Medication Agreement. This must be signed before receiving any prescriptions from our practice. Safely store a copy of your signed Agreement. Violations to the Agreement will result in no further prescriptions. (Additional copies of our Medication Agreement are available upon request.) Laws, Rules, & Regulations: All patients are expected to follow all Federal and [employer], [employer], Rules, [employer] [facility]. Ignorance of the Laws does not constitute a valid excuse.  Illegal drugs and Controlled Substances: The use of illegal substances (including, but not limited to marijuana and its derivatives) and/or the illegal use of any controlled substances is strictly prohibited. Violation of this rule may result in the immediate and permanent discontinuation of any and all prescriptions being written by our practice. The use of any illegal substances is prohibited. Adopted CDC guidelines & recommendations: Target dosing levels will be at or below 60 MME/day. Use of benzodiazepines** is not recommended.  Exceptions: There are only two exceptions to the rule of not receiving pain medications from other Healthcare Providers. Exception #1 (Emergencies): In the event of an emergency (i.e.: accident requiring emergency care), you are allowed to receive additional pain medication. However, you are responsible for: As soon as you are able, call our office [phone], at any time of the day or night, and leave a message stating your name, the date and nature of the emergency, and the name and dose of the medication prescribed. In the event that your call is answered by a member of our staff, make sure to document and save the date, time, and the name of the person that took your information.  Exception #2 (Planned Surgery): In the event that you are scheduled by another doctor or dentist to have any type of surgery or procedure, you are allowed (for a period no longer than 30 days), to receive  additional pain medication, for the acute post-op pain. However, in this case, you are responsible for picking up a copy of our "Post-op Pain Management for Surgeons" handout, and giving it to your surgeon or dentist. This document is available at our office, and does not require an appointment to obtain it. Simply go to our office during business hours (Monday-Thursday from 8:00 AM to 4:00 PM) (Friday 8:00 AM to 12:00 Noon) or if you have a scheduled appointment with [place], prior to your surgery, and ask for it by name. In addition, you are responsible for: calling our office [phone], at any time of the day or night, and leaving a message stating your name, name of your surgeon, type of surgery, and date of procedure or surgery. Failure to comply with your responsibilities may result in termination of therapy involving the controlled substances. Medication Agreement Violation. Following the above rules, including your responsibilities will help you in avoiding a Medication Agreement Violation ("Breaking your Pain Medication Contract").  *Opioid medications include: morphine, codeine, oxycodone, oxymorphone, hydrocodone, hydromorphone, meperidine, tramadol, tapentadol, buprenorphine, fentanyl, methadone. **Benzodiazepine medications include: diazepam (Valium), alprazolam (Xanax), clonazepam (Klonopine), lorazepam (Ativan), clorazepate (Tranxene), chlordiazepoxide (Librium), estazolam (Prosom), oxazepam (Serax), temazepam (Restoril), triazolam (Halcion) (Last updated: 04/20/2021) ____________________________________________________________________________________________  ____________________________________________________________________________________________  Medication Recommendations and Reminders  Applies to: All patients receiving prescriptions (written and/or electronic).  Medication Rules & Regulations: These rules and regulations exist for your safety and that of others. They are not  flexible and neither are we. Dismissing or ignoring them will be considered "non-compliance"  with medication therapy, resulting in complete and irreversible termination of such therapy. (See document titled "Medication Rules" for more details.) In all conscience, because of safety reasons, we cannot continue providing a therapy where the patient does not follow instructions.  Pharmacy of record:  Definition: This is the pharmacy where your electronic prescriptions will be sent.  We do not endorse any particular pharmacy, however, we have experienced problems with [facility] not securing enough medication supply for the community. We do not restrict you in your choice of pharmacy. However, once we write for your prescriptions, we will NOT be re-sending more prescriptions to fix restricted supply problems created by your pharmacy, or your insurance.  The pharmacy listed in the electronic medical record should be the one where you want electronic prescriptions to be sent. If you choose to change pharmacy, simply notify our nursing staff.  Recommendations: Keep all of your pain medications in a safe place, under lock and key, even if you live alone. We will NOT replace lost, stolen, or damaged medication. After you fill your prescription, take 1 week's worth of pills and put them away in a safe place. You should keep a separate, properly labeled bottle for this purpose. The remainder should be kept in the original bottle. Use this as your primary supply, until it runs out. Once it's gone, then you know that you have 1 week's worth of medicine, and it is time to come in for a prescription refill. If you do this correctly, it is unlikely that you will ever run out of medicine. To make sure that the above recommendation works, it is very important that you make sure your medication refill appointments are scheduled at least 1 week before you run out of medicine. To do this in an effective manner, make sure that  you do not leave the office without scheduling your next medication management appointment. Always ask the nursing staff to show you in your prescription , when your medication will be running out. Then arrange for the receptionist to get you a return appointment, at least 7 days before you run out of medicine. Do not wait until you have 1 or 2 pills left, to come in. This is very poor planning and does not take into consideration that we may need to cancel appointments due to bad weather, sickness, or emergencies affecting our staff. DO NOT ACCEPT A "Partial Fill": If for any reason your pharmacy does not have enough pills/tablets to completely fill or refill your prescription, do not allow for a "partial fill". The law allows the pharmacy to complete that prescription within 72 hours, without requiring a new prescription. If they do not fill the rest of your prescription within those 72 hours, you will need a separate prescription to fill the remaining amount, which we will NOT provide. If the reason for the partial fill is your insurance, you will need to talk to the pharmacist about payment alternatives for the remaining tablets, but again, DO NOT ACCEPT A PARTIAL FILL, unless you can trust your pharmacist to obtain the remainder of the pills within 72 hours.  Prescription refills and/or changes in medication(s):  Prescription refills, and/or changes in dose or medication, will be conducted only during scheduled medication management appointments. (Applies to both, written and electronic prescriptions.) No refills on procedure days. No medication will be changed or started on procedure days. No changes, adjustments, and/or refills will be conducted on a procedure day. Doing so will interfere with the diagnostic portion  of the procedure. No phone refills. No medications will be "called into the pharmacy". No Fax refills. No weekend refills. No Holliday refills. No after hours refills.  Remember:   Business hours are:  Monday to Thursday 8:00 AM to 4:00 PM Provider's Schedule: Milinda Pointer, MD - Appointments are:  Medication management: Monday and Wednesday 8:00 AM to 4:00 PM Procedure day: Tuesday and Thursday 7:30 AM to 4:00 PM Gillis Santa, MD - Appointments are:  Medication management: Tuesday and Thursday 8:00 AM to 4:00 PM Procedure day: Monday and Wednesday 7:30 AM to 4:00 PM (Last update: 02/11/2020) ____________________________________________________________________________________________  ____________________________________________________________________________________________  CBD (cannabidiol) & Delta-8 (Delta-8 tetrahydrocannabinol) WARNING  Intro: Cannabidiol (CBD) and tetrahydrocannabinol (THC), are two natural compounds found in plants of the Cannabis genus. They can both be extracted from hemp or cannabis. Hemp and cannabis come from the Cannabis sativa plant. Both compounds interact with your body's endocannabinoid system, but they have very different effects. CBD does not produce the high sensation associated with cannabis. Delta-8 tetrahydrocannabinol, also known as delta-8 THC, is a psychoactive substance found in the Cannabis sativa plant, of which marijuana and hemp are two varieties. THC is responsible for the high associated with the illicit use of marijuana.  Applicable to: All individuals currently taking or considering taking CBD (cannabidiol) and, more important, all patients taking opioid analgesic controlled substances (pain medication). (Example: oxycodone; oxymorphone; hydrocodone; hydromorphone; morphine; methadone; tramadol; tapentadol; fentanyl; buprenorphine; butorphanol; dextromethorphan; meperidine; codeine; etc.)  Legal status: CBD remains a Schedule I drug prohibited for any use. CBD is illegal with one exception. In the [place], CBD has a limited Transport planner (FDA) approval for the treatment of two specific types of  epilepsy disorders. Only one CBD product has been approved by the FDA for this purpose: "Epidiolex". FDA is aware that some companies are marketing products containing cannabis and cannabis-derived compounds in ways that violate the [employer], Drug and Cosmetic Act [facility] Act) and that may put the health and safety of consumers at risk. The FDA, a Federal agency, has not enforced the CBD status since 2018. UPDATE: (09/09/2021) The Drug Enforcement Agency ([place]) issued a letter stating that "delta" cannabinoids, including Delta-8-THCO and Delta-9-THCO, synthetically derived from hemp do not qualify as hemp and will be viewed as Schedule I drugs. (Schedule I drugs, substances, or chemicals are defined as drugs with no currently accepted medical use and a high potential for abuse. Some examples of Schedule I drugs are: heroin, lysergic acid diethylamide (LSD), marijuana (cannabis), 3,4-methylenedioxymethamphetamine (ecstasy), methaqualone, and peyote.) (https://jennings.com/)  Legality: Some manufacturers ship CBD products nationally, which is illegal. Often such products are sold online and are therefore available throughout the country. CBD is openly sold in head shops and health food stores in some states where such sales have not been explicitly legalized. Selling unapproved products with unsubstantiated therapeutic claims is not only a violation of the law, but also can put patients at risk, as these products have not been proven to be safe or effective. Federal illegality makes it difficult to conduct research on CBD.  Reference: "FDA Regulation of Cannabis and Cannabis-Derived Products, Including Cannabidiol (CBD)" - [employer].pt  Warning: CBD is not FDA approved and has not undergo the same manufacturing controls as prescription drugs.  This means that the purity and safety of available  CBD may be questionable. Most of the time, despite manufacturer's claims, it is contaminated with THC (delta-9-tetrahydrocannabinol - the chemical in marijuana responsible for the "HIGH").  When this  is the case, the [facility] contaminant will trigger a positive urine drug screen (UDS) test for Marijuana (carboxy-THC). Because a positive UDS for any illicit substance is a violation of our medication agreement, your opioid analgesics (pain medicine) may be permanently discontinued. The FDA recently put out a warning about 5 things that everyone should be aware of regarding Delta-8 THC: Delta-8 THC products have not been evaluated or approved by the FDA for safe use and may be marketed in ways that put the public health at risk. The FDA has received adverse event reports involving delta-8 THC-containing products. Delta-8 THC has psychoactive and intoxicating effects. Delta-8 THC manufacturing often involve use of potentially harmful chemicals to create the concentrations of delta-8 THC claimed in the marketplace. The final delta-8 THC product may have potentially harmful by-products (contaminants) due to the chemicals used in the process. Manufacturing of delta-8 THC products may occur in uncontrolled or unsanitary settings, which may lead to the presence of unsafe contaminants or other potentially harmful substances. Delta-8 THC products should be kept out of the reach of children and pets.  MORE ABOUT CBD  General Information: CBD was discovered in [birth year] and it is a derivative of the cannabis sativa genus plants (Marijuana and Hemp). It is one of the 113 identified substances found in Marijuana. It accounts for up to 40% of the plant's extract. As of 2018, preliminary clinical studies on CBD included research for the treatment of anxiety, movement disorders, and pain. CBD is available and consumed in multiple forms, including inhalation of smoke or vapor, as an aerosol spray, and by mouth. It may be supplied as  an oil containing CBD, capsules, dried cannabis, or as a liquid solution. CBD is thought not to be as psychoactive as THC (delta-9-tetrahydrocannabinol - the chemical in marijuana responsible for the "HIGH"). Studies suggest that CBD may interact with different biological target receptors in the body, including cannabinoid and other neurotransmitter receptors. As of 2018 the mechanism of action for its biological effects has not been determined.  Side-effects  Adverse reactions: Dry mouth, diarrhea, decreased appetite, fatigue, drowsiness, malaise, weakness, sleep disturbances, and others.  Drug interactions: CBC may interact with other medications such as blood-thinners. Because CBD causes drowsiness on its own, it also increases the drowsiness caused by other medications, including antihistamines (such as Benadryl), benzodiazepines (Xanax, Ativan, Valium), antipsychotics, antidepressants and opioids, as well as alcohol and supplements such as kava, melatonin and St. John's Wort. Be cautious with the following combinations:   Brivaracetam (Briviact) Brivaracetam is changed and broken down by the body. CBD might decrease how quickly the body breaks down brivaracetam. This might increase levels of brivaracetam in the body.  Caffeine Caffeine is changed and broken down by the body. CBD might decrease how quickly the body breaks down caffeine. This might increase levels of caffeine in the body.  Carbamazepine (Tegretol) Carbamazepine is changed and broken down by the body. CBD might decrease how quickly the body breaks down carbamazepine. This might increase levels of carbamazepine in the body and increase its side effects.  Citalopram (Celexa) Citalopram is changed and broken down by the body. CBD might decrease how quickly the body breaks down citalopram. This might increase levels of citalopram in the body and increase its side effects.  Clobazam (Onfi) Clobazam is changed and broken down by the  liver. CBD might decrease how quickly the liver breaks down clobazam. This might increase the effects and side effects of clobazam.  Eslicarbazepine (Aptiom) Eslicarbazepine is changed and  broken down by the body. CBD might decrease how quickly the body breaks down eslicarbazepine. This might increase levels of eslicarbazepine in the body by a small amount.  Everolimus (Zostress) Everolimus is changed and broken down by the body. CBD might decrease how quickly the body breaks down everolimus. This might increase levels of everolimus in the body.  Lithium Taking higher doses of CBD might increase levels of lithium. This can increase the risk of lithium toxicity.  Medications changed by the liver (Cytochrome P450 1A1 (CYP1A1) substrates) Some medications are changed and broken down by the liver. CBD might change how quickly the liver breaks down these medications. This could change the effects and side effects of these medications.  Medications changed by the liver (Cytochrome P450 1A2 (CYP1A2) substrates) Some medications are changed and broken down by the liver. CBD might change how quickly the liver breaks down these medications. This could change the effects and side effects of these medications.  Medications changed by the liver (Cytochrome P450 1B1 (CYP1B1) substrates) Some medications are changed and broken down by the liver. CBD might change how quickly the liver breaks down these medications. This could change the effects and side effects of these medications.  Medications changed by the liver (Cytochrome P450 2A6 (CYP2A6) substrates) Some medications are changed and broken down by the liver. CBD might change how quickly the liver breaks down these medications. This could change the effects and side effects of these medications.  Medications changed by the liver (Cytochrome P450 2B6 (CYP2B6) substrates) Some medications are changed and broken down by the liver. CBD might change how  quickly the liver breaks down these medications. This could change the effects and side effects of these medications.  Medications changed by the liver (Cytochrome P450 2C19 (CYP2C19) substrates) Some medications are changed and broken down by the liver. CBD might change how quickly the liver breaks down these medications. This could change the effects and side effects of these medications.  Medications changed by the liver (Cytochrome P450 2C8 (CYP2C8) substrates) Some medications are changed and broken down by the liver. CBD might change how quickly the liver breaks down these medications. This could change the effects and side effects of these medications.  Medications changed by the liver (Cytochrome P450 2C9 (CYP2C9) substrates) Some medications are changed and broken down by the liver. CBD might change how quickly the liver breaks down these medications. This could change the effects and side effects of these medications.  Medications changed by the liver (Cytochrome P450 2D6 (CYP2D6) substrates) Some medications are changed and broken down by the liver. CBD might change how quickly the liver breaks down these medications. This could change the effects and side effects of these medications.  Medications changed by the liver (Cytochrome P450 2E1 (CYP2E1) substrates) Some medications are changed and broken down by the liver. CBD might change how quickly the liver breaks down these medications. This could change the effects and side effects of these medications.  Medications changed by the liver (Cytochrome P450 3A4 (CYP3A4) substrates) Some medications are changed and broken down by the liver. CBD might change how quickly the liver breaks down these medications. This could change the effects and side effects of these medications.  Medications changed by the liver (Glucuronidated drugs) Some medications are changed and broken down by the liver. CBD might change how quickly the liver breaks  down these medications. This could change the effects and side effects of these medications.  Medications that decrease the breakdown  of other medications by the liver (Cytochrome P450 2C19 (CYP2C19) inhibitors) CBD is changed and broken down by the liver. Some drugs decrease how quickly the liver changes and breaks down CBD. This could change the effects and side effects of CBD.  Medications that decrease the breakdown of other medications in the liver (Cytochrome P450 3A4 (CYP3A4) inhibitors) CBD is changed and broken down by the liver. Some drugs decrease how quickly the liver changes and breaks down CBD. This could change the effects and side effects of CBD.  Medications that increase breakdown of other medications by the liver (Cytochrome P450 3A4 (CYP3A4) inducers) CBD is changed and broken down by the liver. Some drugs increase how quickly the liver changes and breaks down CBD. This could change the effects and side effects of CBD.  Medications that increase the breakdown of other medications by the liver (Cytochrome P450 2C19 (CYP2C19) inducers) CBD is changed and broken down by the liver. Some drugs increase how quickly the liver changes and breaks down CBD. This could change the effects and side effects of CBD.  Methadone (Dolophine) Methadone is broken down by the liver. CBD might decrease how quickly the liver breaks down methadone. Taking cannabidiol along with methadone might increase the effects and side effects of methadone.  Rufinamide (Banzel) Rufinamide is changed and broken down by the body. CBD might decrease how quickly the body breaks down rufinamide. This might increase levels of rufinamide in the body by a small amount.  Sedative medications (CNS depressants) CBD might cause sleepiness and slowed breathing. Some medications, called sedatives, can also cause sleepiness and slowed breathing. Taking CBD with sedative medications might cause breathing problems and/or too  much sleepiness.  Sirolimus (Rapamune) Sirolimus is changed and broken down by the body. CBD might decrease how quickly the body breaks down sirolimus. This might increase levels of sirolimus in the body.  Stiripentol (Diacomit) Stiripentol is changed and broken down by the body. CBD might decrease how quickly the body breaks down stiripentol. This might increase levels of stiripentol in the body and increase its side effects.  Tacrolimus (Prograf) Tacrolimus is changed and broken down by the body. CBD might decrease how quickly the body breaks down tacrolimus. This might increase levels of tacrolimus in the body.  Tamoxifen (Soltamox) Tamoxifen is changed and broken down by the body. CBD might affect how quickly the body breaks down tamoxifen. This might affect levels of tamoxifen in the body.  Topiramate (Topamax) Topiramate is changed and broken down by the body. CBD might decrease how quickly the body breaks down topiramate. This might increase levels of topiramate in the body by a small amount.  Valproate Valproic acid can cause liver injury. Taking cannabidiol with valproic acid might increase the chance of liver injury. CBD and/or valproic acid might need to be stopped, or the dose might need to be reduced.  Warfarin (Coumadin) CBD might increase levels of warfarin, which can increase the risk for bleeding. CBD and/or warfarin might need to be stopped, or the dose might need to be reduced.  Zonisamide Zonisamide is changed and broken down by the body. CBD might decrease how quickly the body breaks down zonisamide. This might increase levels of zonisamide in the body by a small amount. (Last update: 09/21/2021) ____________________________________________________________________________________________  ____________________________________________________________________________________________  Drug Holidays (Slow)  What is a "Drug Holiday"? Drug Holiday: is the name given to  the period of time during which a patient stops taking a medication(s) for the purpose of eliminating tolerance  to the drug.  Benefits Improved effectiveness of opioids. Decreased opioid dose needed to achieve benefits. Improved pain with lesser dose.  What is tolerance? Tolerance: is the progressive decreased in effectiveness of a drug due to its repetitive use. With repetitive use, the body gets use to the medication and as a consequence, it loses its effectiveness. This is a common problem seen with opioid pain medications. As a result, a larger dose of the drug is needed to achieve the same effect that used to be obtained with a smaller dose.  How long should a "Drug Holiday" last? You should stay off of the pain medicine for at least 14 consecutive days. (2 weeks)  Should I stop the medicine "cold [place]"? No. You should always coordinate with your Pain Specialist so that he/she can provide you with the correct medication dose to make the transition as smoothly as possible.  How do I stop the medicine? Slowly. You will be instructed to decrease the daily amount of pills that you take by one (1) pill every seven (7) days. This is called a "slow downward taper" of your dose. For example: if you normally take four (4) pills per day, you will be asked to drop this dose to three (3) pills per day for seven (7) days, then to two (2) pills per day for seven (7) days, then to one (1) per day for seven (7) days, and at the end of those last seven (7) days, this is when the "Drug Holiday" would start.   Will I have withdrawals? By doing a "slow downward taper" like this one, it is unlikely that you will experience any significant withdrawal symptoms. Typically, what triggers withdrawals is the sudden stop of a high dose opioid therapy. Withdrawals can usually be avoided by slowly decreasing the dose over a prolonged period of time. If you do not follow these instructions and decide to stop your  medication abruptly, withdrawals may be possible.  What are withdrawals? Withdrawals: refers to the wide range of symptoms that occur after stopping or dramatically reducing opiate drugs after heavy and prolonged use. Withdrawal symptoms do not occur to patients that use low dose opioids, or those who take the medication sporadically. Contrary to benzodiazepine (example: Valium, Xanax, etc.) or alcohol withdrawals ("Delirium Tremens"), opioid withdrawals are not lethal. Withdrawals are the physical manifestation of the body getting rid of the excess receptors.  Expected Symptoms Early symptoms of withdrawal may include: Agitation Anxiety Muscle aches Increased tearing Insomnia Runny nose Sweating Yawning  Late symptoms of withdrawal may include: Abdominal cramping Diarrhea Dilated pupils Goose bumps Nausea Vomiting  Will I experience withdrawals? Due to the slow nature of the taper, it is very unlikely that you will experience any.  What is a slow taper? Taper: refers to the gradual decrease in dose.  (Last update: 02/11/2020) ____________________________________________________________________________________________   ____________________________________________________________________________________________  Pharmacy Shortages of Pain Medication   Introduction Shockingly as it may seem, .  "No [place] Supreme Court decision has ever interpreted the Constitution as guaranteeing a right to health care for all Americans." - https://huff.com/  "With respect to human rights, the [place] has no formally codified right to health, nor does it participate in a human rights treaty that specifies a right to health." - Scott J. Schweikart, JD, MBE  Situation By now, most of our patients have had the experience of being told by their pharmacist that they do not have enough medication to cover their prescription.  If you have  not had this experience, just know that you soon will.  Problem There appears to be a shortage of these medications, either at the national level or locally. This is happening with all pharmacies. When there is not enough medication, patients are offered a partial fill and they are told that they will try to get the rest of the medicine for them at a later time. If they do not have enough for even a partial fill, the pharmacists are telling the patients to call us (the prescribing physicians) to request that we send another prescription to another pharmacy to get the medicine.   This reordering of a controlled substance creates documentation problems where additional paperwork needs to be created to explain why two prescriptions for the same period of time and the same medicine are being prescribed to the same patient. It also creates situations where the last appointment note does not accurately reflect when and what prescriptions were given to a patient. This leads to prescribing errors down the line, in subsequent follow-up visits.   [employer] of Pharmacy ([employer]) Research revealed that Surveyor, quantity .1806 (21 NCAC 46.1806) authorizes pharmacists to the transfer of prescriptions among pharmacies, and it sets forth procedural and recordkeeping requirements for doing so. However, this requires the pharmacist to complete the previously mentioned procedural paperwork to accomplish the transfer. As it turns out, it is much easier for them to have the prescribing physicians do the work.   Possible solutions 1. You can ask your physician to assist you in weaning yourself off these medications. 2. Ask your pharmacy if the medication is in stock, 3 days prior to your refill. 3. If you need a pharmacy change, let us know at your medication management visit. Prescriptions that have already been electronically sent to a pharmacy will not be re-sent to a different pharmacy if your  pharmacy of record does not have it in stock. Proper stocking of medication is a pharmacy problem, not a prescriber problem. Work with your pharmacist to solve the problem. 4. Have the [employer] [facility] [employer] add a provision to the "STOP ACT" (the law that mandates how controlled substances are prescribed) where there is an exception to the electronic prescribing rule that states that in the event there are shortages of medications the physicians are allowed to use written prescriptions as opposed to electronic ones. This would allow patients to take their prescriptions to a different pharmacy that may have enough medication available to fill the prescription. The problem is that currently there is a law that does not allow for written prescriptions, with the exception of instances where the electronic medical record is down due to technical issues.  5. Have [place] Congress ease the pressure on pharmaceutical companies, allowing them to produce enough quantities of the medication to adequately supply the population. 6. Have pharmacies keep enough stocks of these medications to cover their client base.  7. Have the [facility] Assembly add a provision to the "STOP ACT" where they ease the regulations surrounding the transfer of controlled substances between pharmacies, so as to simplify the transfer of supplies. As an alternative, develop a system to allow patients to obtain the remainder of their prescription at another one of their pharmacies or at an associate pharmacy.   How this shortage will affect you.  Understand that this is a pharmacy supply problem, not a prescriber problem. Work with your pharmacy to solve it. The job of the prescriber is to evaluate  and monitor the patient for the appropriate indications and use of these medicines. It is not the job of the prescriber to supply the medication or to solve problems with that supply. The responsibility and the choice to obtain the  medication resides on the patient. By law, supplying the medication is the job of the pharmacy. It is certainly not the job of the prescriber to solve supply problems.   Due to the above problems we are no longer taking patients to write for their pain medication. Future discussions with your physician may include potentially weaning medications or transitioning to alternatives.  We will be focusing primarily on interventional based pain management. We will continue to evaluate for appropriate indications and we may provide recommendations regarding medication, dose, and schedule, as well as monitoring recommendations, however, we will not be taking over the actual prescribing of these substances. On those patients where we are treating their chronic pain with interventional therapies, exceptions will be considered on a case by case basis. At this time, we will try to continue providing this supplemental service to those patients we have been managing in the past. However, as of August 1st, 2023, we no longer will be sending additional prescriptions to other pharmacies for the purpose of solving their supply problems. Once we send a prescription to a pharmacy, we will not be resending it again to another pharmacy to cover for their shortages.   What to do. Write as many letters as you can. Recruit the help of family members in writing these letters. Below are some of the places where you can write to make your voice heard. Let them know what the problem is and push them to look for solutions.   Search internet for: "[employer] find your legislators" NoseSwap.is  Search internet for: "[employer] commissioner complaints" Starlas.fi  Search internet for: "[employer] complaints" https://www.hernandez-brewer.com/.htm  Search internet for: "[facility] complaints" Email [facility]  Customer Relations woondaal.com.jsp?callType=store  Search internet for: Programme researcher, broadcasting/film/video customer service complaints" https://www.[facility].com/topic/marketing/[email or web address]_customerservice.jsp  ____________________________________________________________________________________________

## 2022-05-01 NOTE — Progress Notes (Signed)
Nursing Pain Medication Assessment:  Safety precautions to be maintained throughout the outpatient stay will include: orient to surroundings, keep bed in low position, maintain call bell within reach at all times, provide assistance with transfer out of bed and ambulation.  Medication Inspection Compliance: Pill count conducted under aseptic conditions, in front of the patient. Neither the pills nor the bottle was removed from the patient's sight at any time. Once count was completed pills were immediately returned to the patient in their original bottle.  Medication: Hydrocodone/APAP Pill/Patch Count:  62 of 90 pills remain Pill/Patch Appearance: Markings consistent with prescribed medication Bottle Appearance: Standard pharmacy container. Clearly labeled. Filled Date: 09 / 29 / 2023 Last Medication intake:  Today

## 2022-05-06 NOTE — Progress Notes (Unsigned)
PROVIDER NOTE: Interpretation of information contained herein should be left to medically-trained personnel. Specific patient instructions are provided elsewhere under "Patient Instructions" section of medical record. This document was created in part using STT-dictation technology, any transcriptional errors that may result from this process are unintentional.  Patient: Wanda [name] Type: Established DOB: 01/05/1946 MRN: [id number] PCP: Colon Branch, MD  Service: Procedure DOS: 05/11/2022 Setting: Ambulatory Location: Ambulatory outpatient facility Delivery: Face-to-face Provider: Gaspar Cola, MD Specialty: Interventional Pain Management Specialty designation: 09 Location: Outpatient facility Ref. Prov.: Colon Branch, MD    Primary Reason for Visit: Interventional Pain Management Treatment. CC: No chief complaint on file.   Procedure:           Type: Zilretta (ER-triamcinolone/steroid) Intra-articular Knee Injection #3  Laterality: Left (-LT) Level/approach: Lateral Imaging guidance: None required ([id number]) Anesthesia: Local anesthesia (1-2% Lidocaine) Anxiolysis: None                 Sedation:                         DOS: 05/11/2022  Performed by: Gaspar Cola, MD  Purpose: Diagnostic/Therapeutic Indications: Knee arthralgia associated to osteoarthritis of the knee 1. Arthropathy of knee (Left)   2. Osteoarthritis of knee (Left)   3. Tricompartment osteoarthritis of knee (Left)   4. Morbid obesity with body mass index (BMI) of 40.0 to 44.9 in adult [facility])    NAS-11 score:   Pre-procedure:  /10   Post-procedure:  /10     Pre-Procedure Preparation  Monitoring: As per clinic protocol.  Risk Assessment: Vitals:  FOY:DXAJOINOM body mass index is 39.48 kg/m as calculated from the following:   Height as of 05/01/22: '5\' 4"'$  (1.626 m).   Weight as of 05/01/22: 230 lb (104.3 kg)., Rate:  , BP: , Resp: , Temp: , SpO2:   Allergies: She is allergic to simvastatin.   Precautions: No additional precautions required  Blood-thinner(s): None at this time  Coagulopathies: Reviewed. None identified.   Active Infection(s): Reviewed. None identified. Ms. Enge is afebrile   Location setting: Exam room Position: Sitting w/ knee bent 90 degrees Safety Precautions: Patient was assessed for positional comfort and pressure points before starting the procedure. Prepping solution: DuraPrep (Iodine Povacrylex [0.7% available iodine] and Isopropyl Alcohol, 74% w/w) Prep Area: Entire knee region Approach: percutaneous, just above the tibial plateau, lateral to the infrapatellar tendon. Intended target: Intra-articular knee space Materials: Tray: Block Needle(s): Regular Qty: 1/side Length: 1.5-inch Gauge: 25G   No orders of the defined types were placed in this encounter.   No orders of the defined types were placed in this encounter.    Time-out:   I initiated and conducted the "Time-out" before starting the procedure, as per protocol. The patient was asked to participate by confirming the accuracy of the "Time Out" information. Verification of the correct person, site, and procedure were performed and confirmed by me, the nursing staff, and the patient. "Time-out" conducted as per Joint Commission's Universal Protocol (UP.01.01.01). Procedure checklist: Completed   H&P (Pre-op  Assessment)  Ms. Lavalle is a 76 y.o. (year old), female patient, seen today for interventional treatment. She  has a past surgical history that includes G3 P3 . Ms. Mazzuca has a current medication list which includes the following prescription(s): atorvastatin, calcium carbonate, vitamin d, cyclobenzaprine, diclofenac sodium, gabapentin, hydrochlorothiazide, hydrocodone-acetaminophen, [START ON 06/06/2022] hydrocodone-acetaminophen, [START ON 07/06/2022] hydrocodone-acetaminophen, lisinopril, naloxone, sertraline, and vitamin d (ergocalciferol). Her  primarily concern today is the No chief  complaint on file.  She is allergic to simvastatin.   Last encounter: My last encounter with her was on 05/01/2022. Pertinent problems: Ms. 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); Chronic sacroiliac joint pain (Right); Chronic radicular pain of lower extremity (Left) (S1 dermatome); Osteoarthritis of hips (Bilateral); Chronic knee pain (1ry area of Pain) (Bilateral) (L>R); Osteoarthritis of knee (Bilateral); Chronic hip pain (Bilateral) (L>R); Chronic low back pain (Bilateral) (L>R) w/ sciatica (Left); Chronic musculoskeletal pain; Abnormal MRI, lumbar spine (03/20/2018); Spondylosis without myelopathy or radiculopathy, lumbosacral region; DDD (degenerative disc disease), lumbosacral; Chronic low back pain (Bilateral) (R>L) w/o sciatica; Chronic knee pain (Left); Chronic hip pain (Right); Fibromyalgia; Tricompartment osteoarthritis of knee (Left); Osteoarthritis of knee (Left); Arthropathy of knee (Left); Secondary osteoarthritis of knee (Bilateral); Enthesopathy of knee region (Left); Numbness and tingling of upper extremity (Left); Cervical radiculopathy at C8 (Left); Cervical radiculopathy at C7 (Left); DDD (degenerative disc disease), thoracolumbar; Chronic sacroiliac joint arthropathy (Bilateral); Degenerative joint disease of sacroiliac joint (Bilateral) ([place]); Osteoarthritis of sacroiliac joints (Bilateral) ([place]); Somatic dysfunction of sacroiliac joints (Bilateral); Lumbosacral facet arthropathy (Multilevel) (Bilateral); Grade 1 Anterolisthesis of lumbosacral spine (4-5 mm) (L5/S1); Grade 1  Anterolisthesis of lumbar spine (2-3 mm) (L4/L5); Lumbosacral facet hypertrophy (Multilevel) (Bilateral); and Low back  pain of over 3 months duration on their pertinent problem list. Pain Assessment: Severity of   is reported as a  /10. Location:    / . Onset:  . Quality:  . Timing:  . Modifying factor(s):  [name] Vitals:  vitals were not taken for this visit.   Reason for encounter: "interventional pain management therapy due pain of at least four (4) weeks in duration, with failure to respond and/or inability to tolerate more conservative care.   Site Confirmation: Ms. Behan was asked to confirm the procedure and laterality before marking the site.  Consent: Before the procedure and under the influence of no sedative(s), amnesic(s), or anxiolytics, the patient was informed of the treatment options, risks and possible complications. To fulfill our ethical and legal obligations, as recommended by the American Medical Association's Code of Ethics, I have informed the patient of my clinical impression; the nature and purpose of the treatment or procedure; the risks, benefits, and possible complications of the intervention; the alternatives, including doing nothing; the risk(s) and benefit(s) of the alternative treatment(s) or procedure(s); and the risk(s) and benefit(s) of doing nothing. The patient was provided information about the general risks and possible complications associated with the procedure. These may include, but are not limited to: failure to achieve desired goals, infection, bleeding, organ or nerve damage, allergic reactions, paralysis, and death. In addition, the patient was informed of those risks and complications associated to Spine-related procedures, such as failure to decrease pain; infection (i.e.: Meningitis, epidural or intraspinal abscess); bleeding (i.e.: epidural hematoma, subarachnoid hemorrhage, or any other type of intraspinal or peri-dural bleeding); organ or nerve damage (i.e.: Any type of peripheral nerve, nerve root, or spinal cord injury) with subsequent damage to sensory, motor, and/or  autonomic systems, resulting in permanent pain, numbness, and/or weakness of one or several areas of the body; allergic reactions; (i.e.: anaphylactic reaction); and/or death. Furthermore, the patient was informed of those risks and complications associated with the medications. These  include, but are not limited to: allergic reactions (i.e.: anaphylactic or anaphylactoid reaction(s)); adrenal axis suppression; blood sugar elevation that in diabetics may result in ketoacidosis or comma; water retention that in patients with history of congestive heart failure may result in shortness of breath, pulmonary edema, and decompensation with resultant heart failure; weight gain; swelling or edema; medication-induced neural toxicity; particulate matter embolism and blood vessel occlusion with resultant organ, and/or nervous system infarction; and/or aseptic necrosis of one or more joints. Finally, the patient was informed that Medicine is not an exact science; therefore, there is also the possibility of unforeseen or unpredictable risks and/or possible complications that may result in a catastrophic outcome. The patient indicated having understood very clearly. We have given the patient no guarantees and we have made no promises. Enough time was given to the patient to ask questions, all of which were answered to the patient's satisfaction. Ms. Holzmann has indicated that she wanted to continue with the procedure. Attestation: I, the ordering provider, attest that I have discussed with the patient the benefits, risks, side-effects, alternatives, likelihood of achieving goals, and potential problems during recovery for the procedure that I have provided informed consent.  Date  Time: {CHL [facility]-PAIN TIME CHOICES:21018001}   Prophylactic antibiotics  Anti-infectives (From admission, onward)    None      Indication(s): None identified   Description of procedure   Start Time:   hrs  Local Anesthesia: Once the  patient was positioned, prepped, and time-out was completed. The target area was identified located. The skin was marked with an approved surgical skin marker. Once marked, the skin (epidermis, dermis, and hypodermis), and deeper tissues (fat, connective tissue and muscle) were infiltrated with a small amount of a short-acting local anesthetic, loaded on a 10cc syringe with a 25G, 1.5-in  Needle. An appropriate amount of time was allowed for local anesthetics to take effect before proceeding to the next step. Local Anesthetic: Lidocaine 1-2% The unused portion of the local anesthetic was discarded in the proper designated containers. Safety Precautions: Aspiration looking for blood return was conducted prior to all injections. At no point did I inject any substances, as a needle was being advanced. Before injecting, the patient was told to immediately notify me if she was experiencing any new onset of "ringing in the ears, or metallic taste in the mouth". No attempts were made at seeking any paresthesias. Safe injection practices and needle disposal techniques used. Medications properly checked for expiration dates. SDV (single dose vial) medications used. After the completion of the procedure, all disposable equipment used was discarded in the proper designated medical waste containers.  Technical description: Protocol guidelines were followed. After positioning, the target area was identified and prepped in the usual manner. Skin & deeper tissues infiltrated with local anesthetic. Appropriate amount of time allowed to pass for local anesthetics to take effect. Proper needle placement secured. Once satisfactory needle placement was confirmed, I proceeded to inject the desired solution in slow, incremental fashion, intermittently assessing for discomfort or any signs of abnormal or undesired spread of substance. Once completed, the needle was removed and disposed of, as per hospital protocols. The area was  cleaned, making sure to leave some of the prepping solution back to take advantage of its long term bactericidal properties.  Aspiration:  Negative          There were no vitals filed for this visit.  End Time:   hrs   Imaging guidance  Imaging-assisted Technique: None required.  Indication(s): N/A Exposure Time: N/A Contrast: None Fluoroscopic Guidance: N/A Ultrasound Guidance: N/A Interpretation: N/A   Post-op assessment  Post-procedure Vital Signs:  Pulse/HCG Rate:    Temp:   Resp:   BP:   SpO2:    EBL: None  Complications: No immediate post-treatment complications observed by team, or reported by patient.  Note: The patient tolerated the entire procedure well. A repeat set of vitals were taken after the procedure and the patient was kept under observation following institutional policy, for this type of procedure. Post-procedural neurological assessment was performed, showing return to baseline, prior to discharge. The patient was provided with post-procedure discharge instructions, including a section on how to identify potential problems. Should any problems arise concerning this procedure, the patient was given instructions to immediately contact us, at any time, without hesitation. In any case, we plan to contact the patient by telephone for a follow-up status report regarding this interventional procedure.  Comments:  No additional relevant information.   Plan of care  Chronic Opioid Analgesic:  Hydrocodone/APAP 5/325, 1 tab PO q 8 hrs (15 mg/day of hydrocodone) MME/day: 15 mg/day.   Medications administered: Rashawn L. Dipasquale had no medications administered during this visit.  Follow-up plan:   No follow-ups on file.      Interventional Therapies  Risk  Complexity Considerations:   Estimated body mass index is 38.61 kg/m as calculated from the following:   Height as of this encounter: '5\' 5"'$  (1.651 m).   Weight as of this encounter: 232 lb (105.2  kg). Risk  Complexity Considerations:   NOTE: NO LUMBAR FACET RFA until BMI <35.   Planned  Pending:   Therapeutic bilateral lumbar facet MBB    Under consideration:   Diagnostic left genicular NB #1    Completed:   Palliative IM Toradol/Norflex 60/60 mg (12/01/20, 02/06/22)  Therapeutic right lumbar facet MBB x5 (12/23/2020) (100/100/50/50)  Therapeutic left lumbar facet MBB x3 (12/23/2020) (100/100/75/>75)  Therapeutic right IA steroid knee injection x1 (08/15/2018) (N/A - NP)  Therapeutic left IA steroid knee injection x2 (08/15/2018) (N/A - NP)  Therapeutic left IA Hyalgan knee injection x9 (10/07/2020) (100/100/0/0)  Therapeutic left IA Monovisc knee injection x1 (04/05/2021) (100/100/50/50)    Therapeutic  Palliative (PRN) options:   Therapeutic/palliative left lumbar facet MBB        Recent Visits Date Type Provider Dept  05/01/22 Office Visit Milinda Pointer, MD [facility]  Showing recent visits within past 90 days and meeting all other requirements Future Appointments Date Type Provider Dept  05/11/22 Appointment Milinda Pointer, [facility]  07/26/22 Appointment Milinda Pointer, MD [facility]  Showing future appointments within next 90 days and meeting all other requirements   Disposition: Discharge home  Discharge (Date  Time): 05/11/2022;   hrs.   Primary Care Physician: Colon Branch, MD Location: [facility] Note by: Gaspar Cola, MD Date: 05/11/2022; Time: 6:29 AM  DISCLAIMER: Medicine is not an Chief Strategy Officer. It has no guarantees or warranties. The decision to proceed with this intervention was based on the information collected from the patient. Conclusions were drawn from the patient's questionnaire, interview, and examination. Because information was provided in large part by the patient, it cannot be guaranteed that it has not been purposely or unconsciously manipulated or  altered. Every effort has been made to obtain as much accurate, relevant, available data as possible. Always take into account that the treatment will also be dependent on availability  of resources and existing treatment guidelines, considered by other Pain Management Specialists as being common knowledge and practice, at the time of the intervention. It is also important to point out that variation in procedural techniques and pharmacological choices are the acceptable norm. For Medico-Legal review purposes, the indications, contraindications, technique, and results of the these procedures should only be evaluated, judged and interpreted by a Board-Certified Interventional Pain Specialist with extensive familiarity and expertise in the same exact procedure and technique.

## 2022-05-11 ENCOUNTER — Ambulatory Visit: Payer: Medicare HMO | Attending: Pain Medicine | Admitting: Pain Medicine

## 2022-05-11 ENCOUNTER — Encounter: Payer: Self-pay | Admitting: Pain Medicine

## 2022-05-11 VITALS — BP 134/86 | HR 65 | Temp 97.1°F | Resp 16 | Ht 64.0 in | Wt 230.0 lb

## 2022-05-11 DIAGNOSIS — Z6841 Body Mass Index (BMI) 40.0 and over, adult: Secondary | ICD-10-CM | POA: Insufficient documentation

## 2022-05-11 DIAGNOSIS — M1712 Unilateral primary osteoarthritis, left knee: Secondary | ICD-10-CM

## 2022-05-11 MED ORDER — PENTAFLUOROPROP-TETRAFLUOROETH EX AERO
INHALATION_SPRAY | Freq: Once | CUTANEOUS | Status: AC
  Administered 2022-05-11: 30 via TOPICAL

## 2022-05-11 MED ORDER — ROPIVACAINE HCL 2 MG/ML IJ SOLN
5.0000 mL | Freq: Once | INTRAMUSCULAR | Status: AC
  Administered 2022-05-11: 5 mL via INTRA_ARTICULAR
  Filled 2022-05-11: qty 20

## 2022-05-11 MED ORDER — LIDOCAINE HCL (PF) 1 % IJ SOLN
5.0000 mL | Freq: Once | INTRAMUSCULAR | Status: AC
  Administered 2022-05-11: 5 mL
  Filled 2022-05-11: qty 5

## 2022-05-11 MED ORDER — LIDOCAINE HCL 2 % IJ SOLN
20.0000 mL | Freq: Once | INTRAMUSCULAR | Status: DC
  Filled 2022-05-11: qty 20

## 2022-05-11 MED ORDER — TRIAMCINOLONE ACETONIDE 32 MG IX SRER
32.0000 mg | Freq: Once | INTRA_ARTICULAR | Status: AC
  Administered 2022-05-11: 32 mg via INTRA_ARTICULAR
  Filled 2022-05-11: qty 5

## 2022-05-11 NOTE — Patient Instructions (Signed)
____________________________________________________________________________________________  Virtual Visits   What is a "Virtual Visit"? It is a Metallurgist (medical visit) that takes place on real time (NOT TEXT or E-MAIL) over the telephone or computer device (desktop, laptop, tablet, smart phone, etc.). It allows for more location flexibility between the patient and the healthcare provider.  Who decides when these types of visits will be used? The physician.  Who is eligible for these types of visits? Only those patients that can be reliably reached over the telephone.  What do you mean by reliably? We do not have time to call everyone multiple times, therefore those that tend to screen calls and then call back later are not suitable candidates for this system. We understand how people are reluctant to pickup on "unknown" calls, therefore, we suggest adding our telephone numbers to your list of "CONTACT(s)". This way, you should be able to readily identify our calls when you receive one. All of our numbers are available below.   Who is not eligible? This option is not available for medication management encounters, specially for controlled substances. Patients on pain medications that fall under the category of controlled substances have to come in for "Face-to-Face" encounters. This is required for mandatory monitoring of these substances. You may be asked to provide a sample for an unannounced urine drug screening test (UDS), and we will need to count your pain pills. Not bringing your pills to be counted may result in no refill. Obviously, neither one of these can be done over the phone.  When will this type of visits be used? You can request a virtual visit whenever you are physically unable to attend a regular appointment. The decision will be made by the physician (or healthcare provider) on a case by case basis.   At what time will I be called? This is an  excellent question. The providers will try to call you whenever they have time available. Do not expect to be called at any specific time. The secretaries will assign you a time for your virtual visit appointment, but this is done simply to keep a list of those patients that need to be called, but not for the purpose of keeping a time schedule. Be advised that the call may come in anytime during the day, between the hours of 8:00 AM and 8::00 PM, depending on provider availability. We do understand that the system is not perfect. If you are unable to be available that day on a moments notice, then request an "in-person" appointment rather than a "virtual visit".  Can I request my medication visits to be "Virtual"? Yes you may request it, but the decision is entirely up to the healthcare provider. Control substances require specific monitoring that requires Face-to-Face encounters. The number of encounters  and the extent of the monitoring is determined on a case by case basis.  Add a new contact to your smart phone and label it "PAIN CLINIC" Under this contact add the following numbers: Main: [phone] (Official Contact Number) Nurses: [phone] (These are outgoing only calling systems. Do not call this number.) Dr. Dossie Arbour: [phone] or [phone] (Outgoing calls only. Do not call this number.)  ____________________________________________________________________________________________  ____________________________________________________________________________________________  Post-Procedure Discharge Instructions  Instructions: Apply ice:  Purpose: This will minimize any swelling and discomfort after procedure.  When: Day of procedure, as soon as you get home. How: Fill a plastic sandwich bag with crushed ice. Cover it with a small towel and apply to injection  site. How long: (15 min on, 15 min off) Apply for 15 minutes then remove x 15 minutes.  Repeat sequence on day of  procedure, until you go to bed. Apply heat:  Purpose: To treat any soreness and discomfort from the procedure. When: Starting the next day after the procedure. How: Apply heat to procedure site starting the day following the procedure. How long: May continue to repeat daily, until discomfort goes away. Food intake: Start with clear liquids (like water) and advance to regular food, as tolerated.  Physical activities: Keep activities to a minimum for the first 8 hours after the procedure. After that, then as tolerated. Driving: If you have received any sedation, be responsible and do not drive. You are not allowed to drive for 24 hours after having sedation. Blood thinner: (Applies only to those taking blood thinners) You may restart your blood thinner 6 hours after your procedure. Insulin: (Applies only to Diabetic patients taking insulin) As soon as you can eat, you may resume your normal dosing schedule. Infection prevention: Keep procedure site clean and dry. Shower daily and clean area with soap and water. Post-procedure Pain Diary: Extremely important that this be done correctly and accurately. Recorded information will be used to determine the next step in treatment. For the purpose of accuracy, follow these rules: Evaluate only the area treated. Do not report or include pain from an untreated area. For the purpose of this evaluation, ignore all other areas of pain, except for the treated area. After your procedure, avoid taking a long nap and attempting to complete the pain diary after you wake up. Instead, set your alarm clock to go off every hour, on the hour, for the initial 8 hours after the procedure. Document the duration of the numbing medicine, and the relief you are getting from it. Do not go to sleep and attempt to complete it later. It will not be accurate. If you received sedation, it is likely that you were given a medication that may cause amnesia. Because of this, completing the  diary at a later time may cause the information to be inaccurate. This information is needed to plan your care. Follow-up appointment: Keep your post-procedure follow-up evaluation appointment after the procedure (usually 2 weeks for most procedures, 6 weeks for radiofrequencies). DO NOT FORGET to bring you pain diary with you.   Expect: (What should I expect to see with my procedure?) From numbing medicine (AKA: Local Anesthetics): Numbness or decrease in pain. You may also experience some weakness, which if present, could last for the duration of the local anesthetic. Onset: Full effect within 15 minutes of injected. Duration: It will depend on the type of local anesthetic used. On the average, 1 to 8 hours.  From steroids (Applies only if steroids were used): Decrease in swelling or inflammation. Once inflammation is improved, relief of the pain will follow. Onset of benefits: Depends on the amount of swelling present. The more swelling, the longer it will take for the benefits to be seen. In some cases, up to 10 days. Duration: Steroids will stay in the system x 2 weeks. Duration of benefits will depend on multiple posibilities including persistent irritating factors. Side-effects: If present, they may typically last 2 weeks (the duration of the steroids). Frequent: Cramps (if they occur, drink Gatorade and take over-the-counter Magnesium 450-500 mg once to twice a day); water retention with temporary weight gain; increases in blood sugar; decreased immune system response; increased appetite. Occasional: Facial flushing (red, warm  cheeks); mood swings; menstrual changes. Uncommon: Long-term decrease or suppression of natural hormones; bone thinning. (These are more common with higher doses or more frequent use. This is why we prefer that our patients avoid having any injection therapies in other practices.)  Very Rare: Severe mood changes; psychosis; aseptic necrosis. From procedure: Some  discomfort is to be expected once the numbing medicine wears off. This should be minimal if ice and heat are applied as instructed.  Call if: (When should I call?) You experience numbness and weakness that gets worse with time, as opposed to wearing off. New onset bowel or bladder incontinence. (Applies only to procedures done in the spine)  Emergency Numbers: Durning business hours (Monday - Thursday, 8:00 AM - 4:00 PM) (Friday, 9:00 AM - 12:00 Noon): [phone] After hours: [phone] NOTE: If you are having a problem and are unable connect with, or to talk to a provider, then go to your nearest urgent care or emergency department. If the problem is serious and urgent, please call 911. ____________________________________________________________________________________________   ______________________________________________________________________________________________  Body mass index (BMI)  Body mass index (BMI) is a common tool for deciding whether a person has an appropriate body weight.  It measures a persons weight in relation to their height.   According to the [employer] of health (NIH): A BMI of less than 18.5 means that a person is underweight. A BMI of between 18.5 and 24.9 is ideal. A BMI of between 25 and 29.9 is overweight. A BMI over 30 indicates obesity.  Weight Management Required  URGENT: Your weight has been found to be adversely affecting your health.  Dear Ms. Mckell:  Your current Estimated body mass index is 39.48 kg/m as calculated from the following:   Height as of 05/01/22: $RemoveBef'5\' 4"'[id number]$  (1.626 m).   Weight as of 05/01/22: 230 lb (104.3 kg).  Please use the table below to identify your weight category and associated incidence of chronic pain, secondary to your weight.  Body Mass Index (BMI) Classification BMI level (kg/m2) Category Associated incidence of chronic pain  <18  Underweight   18.5-24.9 Ideal body weight   25-29.9 Overweight   20%  30-34.9 Obese (Class I)  68%  35-39.9 Severe obesity (Class II)  136%  >40 Extreme obesity (Class III)  254%   In addition: You will be considered "Morbidly Obese", if your BMI is above 30 and you have one or more of the following conditions which are known to be caused and/or directly associated with obesity: 1.    Type 2 Diabetes (Which in turn can lead to cardiovascular diseases (CVD), stroke, peripheral vascular diseases (PVD), retinopathy, nephropathy, and neuropathy) 2.    Cardiovascular Disease (High Blood Pressure; Congestive Heart Failure; High Cholesterol; Coronary Artery Disease; Angina; or History of Heart Attacks) 3.    Breathing problems (Asthma; obesity-hypoventilation syndrome; obstructive sleep apnea; chronic inflammatory airway disease; reactive airway disease; or shortness of breath) 4.    Chronic kidney disease 5.    Liver disease (nonalcoholic fatty liver disease) 6.    High blood pressure 7.    Acid reflux (gastroesophageal reflux disease; heartburn) 8.    Osteoarthritis (OA) (with any of the following: hip pain; knee pain; and/or low back pain) 9.    Low back pain (Lumbar Facet Syndrome; and/or Degenerative Disc Disease) 10.  Hip pain (Osteoarthritis of hip) (For every 1 lbs of added body weight, there is a 2 lbs increase in pressure inside of each hip articulation. 1:2 mechanical  relationship) 11.  Knee pain (Osteoarthritis of knee) (For every 1 lbs of added body weight, there is a 4 lbs increase in pressure inside of each knee articulation. 1:4 mechanical relationship) (patients with a BMI>30 kg/m2 were 6.8 times more likely to develop knee OA than normal-weight individuals) 12.  Cancer: Epidemiological studies have shown that obesity is a risk factor for: post-menopausal breast cancer; cancers of the endometrium, colon and kidney cancer; malignant adenomas of the oesophagus. Obese subjects have an approximately 1.5-3.5-fold increased risk of developing these cancers  compared with normal-weight subjects, and it has been estimated that between 15 and 45% of these cancers can be attributed to overweight. More recent studies suggest that obesity may also increase the risk of other types of cancer, including pancreatic, hepatic and gallbladder cancer. (Ref: Obesity and cancer. Pischon T, Nthlings U, [employer] H. Proc Nutr Soc. 2008 May;67(2):128-45. doi: 33.2951/O8416606301601093.) The International Agency for Research on Cancer (IARC) has identified 13 cancers associated with overweight and obesity: meningioma, multiple myeloma, adenocarcinoma of the esophagus, and cancers of the thyroid, postmenopausal breast cancer, gallbladder, stomach, liver, pancreas, kidney, ovaries, uterus, colon and rectal (colorectal) cancers. 43 percent of all cancers diagnosed in women and 24 percent of those diagnosed in men are associated with overweight and obesity.  Recommendation: At this point it is urgent that you take a step back and concentrate in loosing weight. Dedicate 100% of your efforts on this task. Nothing else will improve your health more than bringing your weight down and your BMI to less than 30. If you are here, you probably have chronic pain. We know that most chronic pain patients have difficulty exercising secondary to their pain. For this reason, you must rely on proper nutrition and diet in order to lose the weight. If your BMI is above 40, you should seriously consider bariatric surgery. A realistic goal is to lose 10% of your body weight over a period of 12 months.  Be honest to yourself, if over time you have unsuccessfully tried to lose weight, then it is time for you to seek professional help and to enter a medically supervised weight management program, and/or undergo bariatric surgery. Stop procrastinating.   Pain management considerations:  1.    Pharmacological Problems: Be advised that the use of opioid analgesics (oxycodone; hydrocodone; morphine; methadone;  codeine; and all of their derivatives) have been associated with decreased metabolism and weight gain.  For this reason, should we see that you are unable to lose weight while taking these medications, it may become necessary for [place] to taper down and indefinitely discontinue them.  2.    Technical Problems: The incidence of successful interventional therapies decreases as the patient's BMI increases. It is much more difficult to accomplish a safe and effective interventional therapy on a patient with a BMI above 35. 3.    Radiation Exposure Problems: The x-rays machine, used to accomplish injection therapies, will automatically increase their x-ray output in order to capture an appropriate bone image. This means that radiation exposure increases exponentially with the patient's BMI. (The higher the BMI, the higher the radiation exposure.) Although the level of radiation used at a given time is still safe to the patient, it is not for the physician and/or assisting staff. Unfortunately, radiation exposure is accumulative. Because physicians and the staff have to do procedures and be exposed on a daily basis, this can result in health problems such as cancer and radiation burns. Radiation exposure to the staff is monitored by  the radiation batches that they wear. The exposure levels are reported back to the staff on a quarterly basis. Depending on levels of exposure, physicians and staff may be obligated by law to decrease this exposure. This means that they have the right and obligation to refuse providing therapies where they may be overexposed to radiation. For this reason, physicians may decline to offer therapies such as radiofrequency ablation or implants to patients with a BMI above 40. 4.    Current Trends: Be advised that the current trend is to no longer offer certain therapies to patients with a BMI equal to, or above 35, due to increase perioperative risks, increased technical procedural difficulties,  and excessive radiation exposure to healthcare personnel.  ______________________________________________________________________________________________

## 2022-05-12 ENCOUNTER — Telehealth: Payer: Self-pay

## 2022-05-12 NOTE — Telephone Encounter (Signed)
Post procedure phone call.  LM 

## 2022-05-29 ENCOUNTER — Other Ambulatory Visit: Payer: Self-pay | Admitting: Internal Medicine

## 2022-06-06 ENCOUNTER — Ambulatory Visit: Payer: Medicare HMO | Attending: Pain Medicine | Admitting: Pain Medicine

## 2022-06-06 DIAGNOSIS — Z6841 Body Mass Index (BMI) 40.0 and over, adult: Secondary | ICD-10-CM

## 2022-06-06 DIAGNOSIS — G8929 Other chronic pain: Secondary | ICD-10-CM

## 2022-06-06 DIAGNOSIS — M25562 Pain in left knee: Secondary | ICD-10-CM | POA: Diagnosis not present

## 2022-06-06 DIAGNOSIS — G894 Chronic pain syndrome: Secondary | ICD-10-CM | POA: Diagnosis not present

## 2022-06-06 NOTE — Progress Notes (Signed)
Patient: Wanda [name]  Service Category: E/M  Provider: Gaspar Cola, MD  DOB: October 07, 1945  DOS: 06/06/2022  Location: Office  MRN: [id number]  Setting: Ambulatory outpatient  Referring Provider: Colon Branch, MD  Type: Established Patient  Specialty: Interventional Pain Management  PCP: Colon Branch, MD  Location: Remote location  Delivery: TeleHealth     Virtual Encounter - Pain Management PROVIDER NOTE: Information contained herein reflects review and annotations entered in association with encounter. Interpretation of such information and data should be left to medically-trained personnel. Information provided to patient can be located elsewhere in the medical record under "Patient Instructions". Document created using STT-dictation technology, any transcriptional errors that may result from process are unintentional.    Contact & Pharmacy Preferred: [phone] Home: [phone] (home) Mobile: [phone] (mobile) E-mail: No e-mail address on record  [facility] [phone] [place], [place] - [address] [place] [address] Phone: [phone] Fax: [phone]   Pre-screening  Wanda [name] offered "in-person" vs "virtual" encounter. She indicated preferring virtual for this encounter.   Reason COVID-19*  Social distancing based on CDC and AMA recommendations.   I contacted Wanda [name] on 06/06/2022 via telephone.      I clearly identified myself as Gaspar Cola, MD. I verified that I was speaking with the correct person using two identifiers (Name: Wanda [name], and date of birth: 1946/06/25).  Consent I sought verbal advanced consent from Wanda [name] for virtual visit interactions. I informed Wanda [name] of possible security and privacy concerns, risks, and limitations associated with providing "not-in-person" medical evaluation and management services. I also informed Wanda [name]  of the availability of "in-person" appointments. Finally, I informed her that there would be a charge for the virtual visit and that she could be  personally, fully or partially, financially responsible for it. Wanda [name] expressed understanding and agreed to proceed.   Historic Elements   Wanda [name] is a 76 y.o. year old, female patient evaluated today after our last contact on 05/11/2022. Wanda [name]  has a past medical history of Allergy, Chronic back pain, Depression, DJD (degenerative joint disease), Hyperlipidemia, Hypertension, Menopause, Musculoskeletal pain (12/07/2015), and TMJ PAIN (05/17/2010). She also  has a past surgical history that includes G3 P3 . Wanda [name] has a current medication list which includes the following prescription(s): atorvastatin, calcium carbonate, vitamin d, cyclobenzaprine, diclofenac sodium, gabapentin, hydrochlorothiazide, hydrocodone-acetaminophen, hydrocodone-acetaminophen, [START ON 07/06/2022] hydrocodone-acetaminophen, lisinopril, naloxone, sertraline, and vitamin d (ergocalciferol). She  reports that she has never smoked. She has never used smokeless tobacco. She reports that she does not drink alcohol and does not use drugs. Wanda [name] is allergic to simvastatin.   HPI  Today, she is being contacted for a post-procedure assessment.  The patient indicates having attained a 100% relief of the left knee pain for the duration of the local anesthetic followed by a decrease of about 50%.  However, upon calling the patient today she refers that she is doing better and refers that she currently has an ongoing 85% improvement.  Post-procedure evaluation   Type: Zilretta (ER-triamcinolone/steroid) Intra-articular Knee Injection #3  Laterality: Left (-LT) Level/approach: Lateral Imaging guidance: None required ([id number]) Anesthesia: Local anesthesia (1-2% Lidocaine) Anxiolysis: None                 Sedation: No Sedation  DOS:  05/11/2022  Performed by: Gaspar Cola, MD  Purpose: Diagnostic/Therapeutic Indications: Knee arthralgia associated to osteoarthritis of the knee 1. Arthropathy of knee (Left)   2. Osteoarthritis of knee (Left)   3. Tricompartment osteoarthritis of knee (Left)   4. Morbid obesity with body mass index (BMI) of 40.0 to 44.9 in adult [facility])    NAS-11 score:   Pre-procedure: 6 /10   Post-procedure: 0-No pain/10     Effectiveness:  Initial hour after procedure: 100 %. Subsequent 4-6 hours post-procedure: 100 %. Analgesia past initial 6 hours: 50 % (current). Ongoing improvement:  Analgesic: The patient refers having an ongoing 85% improvement of the left knee pain. Function: Wanda [name] reports improvement in function ROM: Wanda [name] reports improvement in ROM  Pharmacotherapy Assessment   Opioid Analgesic: Hydrocodone/APAP 5/325, 1 tab PO q 8 hrs (15 mg/day of hydrocodone) MME/day: 15 mg/day.   Monitoring: [place] PMP: PDMP reviewed during this encounter.       Pharmacotherapy: No side-effects or adverse reactions reported. Compliance: No problems identified. Effectiveness: Clinically acceptable. Plan: Refer to "POC". UDS:  Summary  Date Value Ref Range Status  02/06/2022 Note  Final    Comment:    ==================================================================== [employer] Select 13 (MW) ==================================================================== Test                             Result       Flag       Units  Drug Present and Declared for Prescription Verification   Hydrocodone                    386          EXPECTED   ng/mg creat   Hydromorphone                  147          EXPECTED   ng/mg creat   Norhydrocodone                 856          EXPECTED   ng/mg creat    Sources of hydrocodone include scheduled prescription medications.    Hydromorphone and norhydrocodone are expected metabolites of    hydrocodone. Hydromorphone is also available as a  scheduled    prescription medication.  ==================================================================== Test                      Result    Flag   Units      Ref Range   Creatinine              133              mg/dL      >=20 ==================================================================== Declared Medications:  The flagging and interpretation on this report are based on the  following declared medications.  Unexpected results may arise from  inaccuracies in the declared medications.   **Note: The testing scope of this panel includes these medications:   Hydrocodone (Norco)   **Note: The testing scope of this panel does not include the  following reported medications:   Acetaminophen (Norco)  Atorvastatin (Lipitor)  Calcium  Cyclobenzaprine (Flexeril)  Gabapentin (Neurontin)  Hydrochlorothiazide (Hydrodiuril)  Lisinopril (Zestril)  Sertraline (Zoloft)  Topical Diclofenac (Voltaren)  Topical Lidocaine (Lidoderm)  Vitamin D  Vitamin D2 (Drisdol) ==================================================================== For clinical consultation, please call [phone]. ====================================================================  No results found for: "CBDTHCR", "D8THCCBX", "D9THCCBX"   Laboratory Chemistry Profile   Renal Lab Results  Component Value Date   BUN 12 03/06/2022   CREATININE 0.68 23/76/2831   BCR NOT APPLICABLE 51/76/1607   GFR 86.22 03/06/2022   GFRAA 103 02/13/2019   GFRNONAA 89 02/13/2019    Hepatic Lab Results  Component Value Date   AST 22 08/30/2021   ALT 19 08/30/2021   ALBUMIN 4.2 08/30/2021   ALKPHOS 77 08/30/2021   HCVAB NEGATIVE 08/30/2016    Electrolytes Lab Results  Component Value Date   NA 137 03/06/2022   K 3.9 03/06/2022   CL 101 03/06/2022   CALCIUM 9.8 03/06/2022   MG 1.8 02/13/2019    Bone Lab Results  Component Value Date   VD25OH 22.03 (L) 03/06/2022   VD125OH2TOT 68 12/24/2017   PX1062IR4 68  12/24/2017   VD2125OH2 <8 12/24/2017   25OHVITD1 22 (L) 02/13/2019   25OHVITD2 3.4 02/13/2019   25OHVITD3 19 02/13/2019    Inflammation (CRP: Acute Phase) (ESR: Chronic Phase) Lab Results  Component Value Date   CRP 8 02/13/2019   ESRSEDRATE 22 01/16/2020         Note: Above Lab results reviewed.  Imaging  MR KNEE LEFT WO CONTRAST CLINICAL DATA:  Knee pain. Progressive worsening. Ongoing for 8 years  EXAM: MRI OF THE LEFT KNEE WITHOUT CONTRAST  TECHNIQUE: Multiplanar, multisequence MR imaging of the knee was performed. No intravenous contrast was administered.  COMPARISON:  None.  FINDINGS: MENISCI  Medial: Maceration of the body and posterior horn of the medial meniscus. Maceration of the anterior horn-body junction of the medial meniscus.  Lateral: Tiny radial tear of the free edge of the body of the lateral meniscus.  LIGAMENTS  Cruciates: Complete chronic ACL tear.  Intact PCL.  Collaterals: Medial collateral ligament is intact. Lateral collateral ligament complex is intact.  CARTILAGE  Patellofemoral: High-grade partial-thickness cartilage loss of the patellofemoral compartment with areas of full-thickness cartilage loss of the lateral patellar facet with subchondral reactive marrow changes.  Medial: Extensive full-thickness cartilage loss of the medial femorotibial compartment with subchondral reactive marrow edema and cystic changes.  Lateral: Partial-thickness cartilage loss of the lateral femorotibial compartment with small areas of full-thickness cartilage loss along the medial aspect of the lateral tibial plateau.  JOINT: No joint effusion. Normal Hoffa's fat-pad. No plical thickening. Two loose bodies posterior to the lateral femoral condyle.  POPLITEAL FOSSA: Popliteus tendon is intact. No Baker's cyst.  EXTENSOR MECHANISM: Intact quadriceps tendon. Intact patellar tendon. Intact lateral patellar retinaculum. Intact medial  patellar retinaculum. Intact MPFL.  BONES: No aggressive osseous lesion. No fracture or dislocation.  Other: No fluid collection or hematoma. Muscles are normal. Small ganglion cyst arising from the proximal tibiofibular joint.  IMPRESSION: 1. Maceration of the body and posterior horn of the medial meniscus. Maceration of the anterior horn-body junction of the medial meniscus. 2. Tiny radial tear of the free edge of the body of the lateral meniscus. 3.  Tricompartmental cartilage abnormalities as described above.  Electronically Signed   By: Kathreen Devoid M.D.   On: 04/22/2021 07:04  Assessment  The primary encounter diagnosis was Chronic knee pain (Left). Diagnoses of Chronic low back pain (Bilateral) (R>L) w/o sciatica, Chronic pain syndrome, and Morbid obesity with body mass index (BMI) of 40.0 to 44.9 in adult [facility]) were also pertinent to this visit.  Plan of Care  Problem-specific:  No problem-specific Assessment & Plan notes found for this encounter.  Wanda [name] has a current medication list which includes the following long-term medication(s): atorvastatin, calcium carbonate, cyclobenzaprine, gabapentin, hydrochlorothiazide, hydrocodone-acetaminophen, hydrocodone-acetaminophen, [START ON 07/06/2022] hydrocodone-acetaminophen, lisinopril, and sertraline.  Pharmacotherapy (Medications Ordered): No orders of the defined types were placed in this encounter.  Orders:  No orders of the defined types were placed in this encounter.  Follow-up plan:   No follow-ups on file.     Interventional Therapies  Risk  Complexity Considerations:   Estimated body mass index is 38.61 kg/m as calculated from the following:   Height as of this encounter: _0  (1.651 m).   Weight as of this encounter: 232 lb (105.2 kg). Risk  Complexity Considerations:   NOTE: NO LUMBAR FACET RFA until BMI <35.   Planned  Pending:      Under consideration:   Diagnostic left genicular NB  #1  Therapeutic bilateral lumbar facet MBB    Completed:   Palliative IM Toradol/Norflex 60/60 mg (12/01/20, 02/06/22)  Therapeutic right lumbar facet MBB x5 (12/23/2020) (100/100/50/50)  Therapeutic left lumbar facet MBB x3 (12/23/2020) (100/100/75/>75)  Therapeutic right IA steroid knee injection x1 (08/15/2018) (N/A - NP)  Therapeutic left IA steroid knee injection x2 (08/15/2018) (N/A - NP)  Therapeutic left IA Hyalgan knee injection x9 (10/07/2020) (100/100/0/0)  Therapeutic left IA Monovisc knee injection x1 (04/05/2021) (100/100/50/50)  Therapeutic left IA Zilretta knee injection x1 (05/11/2022) (100/100/50/85)    Therapeutic  Palliative (PRN) options:   Therapeutic/palliative left lumbar facet MBB      Recent Visits Date Type Provider Dept  05/11/22 Procedure visit Milinda Pointer, MD [facility]  05/01/22 Office Visit Milinda Pointer, MD [facility]  Showing recent visits within past 90 days and meeting all other requirements Today's Visits Date Type Provider Dept  06/06/22 Office Visit Milinda Pointer, MD [facility]  Showing today's visits and meeting all other requirements Future Appointments Date Type Provider Dept  07/26/22 Appointment Milinda Pointer, MD [facility]  Showing future appointments within next 90 days and meeting all other requirements  I discussed the assessment and treatment plan with the patient. The patient was provided an opportunity to ask questions and all were answered. The patient agreed with the plan and demonstrated an understanding of the instructions.  Patient advised to call back or seek an in-person evaluation if the symptoms or condition worsens.  Duration of encounter: 12 minutes.  Note by: Gaspar Cola, MD Date: 06/06/2022; Time: 12:54 PM

## 2022-06-08 NOTE — Addendum Note (Signed)
Addended by: Damita Dunnings D on: 06/08/2022 11:12 AM   Modules accepted: Orders

## 2022-07-26 ENCOUNTER — Other Ambulatory Visit: Payer: Self-pay | Admitting: Pain Medicine

## 2022-07-26 ENCOUNTER — Ambulatory Visit (HOSPITAL_BASED_OUTPATIENT_CLINIC_OR_DEPARTMENT_OTHER): Payer: Medicare HMO | Admitting: Pain Medicine

## 2022-07-26 DIAGNOSIS — Z91199 Patient's noncompliance with other medical treatment and regimen due to unspecified reason: Secondary | ICD-10-CM

## 2022-07-26 NOTE — Progress Notes (Signed)
NO-SHOW to 07/26/2022 appointment.

## 2022-07-26 NOTE — Patient Instructions (Signed)
____________________________________________________________________________________________  Patient Information update  To: All of our patients.  Re: Name change.  It has been made official that our current name, "[facility]"   will soon be changed to " [facility]".   The purpose of this change is to eliminate any confusion created by the concept of our practice being a "Medication Management Pain Clinic". In the past this has led to the misconception that we treat pain primarily by the use of prescription medications.  Nothing can be farther from the truth.   Understanding PAIN MANAGEMENT: To further understand what our practice does, you first have to understand that "Pain Management" is a subspecialty that requires additional training once a physician has completed their specialty training, which can be in either Anesthesia, Neurology, Psychiatry, or Physical Medicine and Rehabilitation (PMR). Each one of these contributes to the final approach taken by each physician to the management of their patient's pain. To be a "Pain Management Specialist" you must have first completed one of the specialty trainings below.  Anesthesiologists - trained in clinical pharmacology and interventional techniques such as nerve blockade and regional as well as central neuroanatomy. They are trained to block pain before, during, and after surgical interventions.  Neurologists - trained in the diagnosis and pharmacological treatment of complex neurological conditions, such as Multiple Sclerosis, Parkinson's, spinal cord injuries, and other systemic conditions that may be associated with symptoms that may include but are not limited to pain. They tend to rely primarily on the treatment of chronic pain using prescription medications.  Psychiatrist - trained in conditions affecting the psychosocial  wellbeing of patients including but not limited to depression, anxiety, schizophrenia, personality disorders, addiction, and other substance use disorders that may be associated with chronic pain. They tend to rely primarily on the treatment of chronic pain using prescription medications.   Physical Medicine and Rehabilitation (PMR) physicians, also known as physiatrists - trained to treat a wide variety of medical conditions affecting the brain, spinal cord, nerves, bones, joints, ligaments, muscles, and tendons. Their training is primarily aimed at treating patients that have suffered injuries that have caused severe physical impairment. Their training is primarily aimed at the physical therapy and rehabilitation of those patients. They may also work alongside orthopedic surgeons or neurosurgeons using their expertise in assisting surgical patients to recover after their surgeries.  INTERVENTIONAL PAIN MANAGEMENT is sub-subspecialty of Pain Management.  Our physicians are Board-certified in Anesthesia, Pain Management, and Interventional Pain Management.  This meaning that not only have they been trained and Board-certified in their specialty of Anesthesia, and subspecialty of Pain Management, but they have also received further training in the sub-subspecialty of Interventional Pain Management, in order to become Board-certified as INTERVENTIONAL PAIN MANAGEMENT SPECIALIST.    Mission: Our goal is to use our skills in  INTERVENTIONAL PAIN MANAGEMENT as alternatives to the chronic use of prescription opioid medications for the treatment of pain. To make this more clear, we have changed our name to reflect what we do and offer. We will continue to offer medication management assessment and recommendations, but we will not be taking over any patient's medication management.  ____________________________________________________________________________________________      ____________________________________________________________________________________________  National Pain Medication Shortage  The [place] is experiencing worsening drug shortages. These have had a negative widespread effect on patient care and treatment. Not expected to improve any time soon. Predicted to last past 2029.   Drug shortage list (generic   names) Oxycodone IR Oxycodone/APAP Oxymorphone IR Hydromorphone Hydrocodone/APAP Morphine  Where is the problem?  Manufacturing and supply level.  Will this shortage affect you?  Only if you take any of the above pain medications.  How? You may be unable to fill your prescription.  Your pharmacist may offer a "partial fill" of your prescription. (Warning: Do not accept partial fills.) Prescriptions partially filled cannot be transferred to another pharmacy. Read our Medication Rules and Regulation. Depending on how much medicine you are dependent on, you may experience withdrawals when unable to get the medication.  Recommendations: Consider ending your dependence on opioid pain medications. Ask your pain specialist to assist you with the process. Consider switching to a medication currently not in shortage, such as Buprenorphine. Talk to your pain specialist about this option. Consider decreasing your pain medication requirements by managing tolerance thru "Drug Holidays". This may help minimize withdrawals, should you run out of medicine. Control your pain thru the use of non-pharmacological interventional therapies.   Your prescriber: Prescribers cannot be blamed for shortages. Medication manufacturing and supply issues cannot be fixed by the prescriber.   NOTE: The prescriber is not responsible for supplying the medication, or solving supply issues. Work with your pharmacist to solve it. The patient is responsible for the decision to take or continue taking the medication and for identifying and securing a legal supply source. By  law, supplying the medication is the job and responsibility of the pharmacy. The prescriber is responsible for the evaluation, monitoring, and prescribing of these medications.   Prescribers will NOT: Re-issue prescriptions that have been partially filled. Re-issue prescriptions already sent to a pharmacy.  Re-send prescriptions to a different pharmacy because yours did not have your medication. Ask pharmacist to order more medicine or transfer the prescription to another pharmacy. (Read below.)  New 2023 regulation: "March 24, 2022 Revised Regulation Allows DEA-Registered Pharmacies to Transfer Electronic Prescriptions at a Patient's Request DEA Headquarters Division - Public Information Office Patients now have the ability to request their electronic prescription be transferred to another pharmacy without having to go back to their practitioner to initiate the request. This revised regulation went into effect on Monday, March 20, 2022.     At a patient's request, a DEA-registered retail pharmacy can now transfer an electronic prescription for a controlled substance (schedules II-V) to another DEA-registered retail pharmacy. Prior to this change, patients would have to go through their practitioner to cancel their prescription and have it re-issued to a different pharmacy. The process was taxing and time consuming for both patients and practitioners.    The Drug Enforcement Administration (DEA) published its intent to revise the process for transferring electronic prescriptions on June 11, 2020.  The final rule was published in the federal register on February 16, 2022 and went into effect 30 days later.  Under the final rule, a prescription can only be transferred once between pharmacies, and only if allowed under existing state or other applicable law. The prescription must remain in its electronic form; may not be altered in any way; and the transfer must be communicated directly between  two licensed pharmacists. It's important to note, any authorized refills transfer with the original prescription, which means the entire prescription will be filled at the same pharmacy".  Reference: https://www.dea.gov/stories/2023/2023-03/2022-09-01/revised-regulation-allows-dea-registered-pharmacies-transfer (DEA website announcement)  https://www.govinfo.gov/content/pkg/FR-2022-02-16/pdf/2023-15847.pdf (Federal Register  Department of Justice)   Federal Register / Vol. 88, No. 143 / Thursday, February 16, 2022 / Rules and Regulations DEPARTMENT OF JUSTICE  Drug Enforcement   Administration  21 CFR Part 1306  [Docket No. [id number]]  RIN 1117-AB64 Transfer of Electronic Prescriptions for Schedules II-V Controlled Substances Between Pharmacies for Initial Filling  ____________________________________________________________________________________________     ____________________________________________________________________________________________  Drug Holidays  What is a "Drug Holiday"? Drug Holiday: is the name given to the process of slowly tapering down and temporarily stopping the pain medication for the purpose of decreasing or eliminating tolerance to the drug.  Benefits Improved effectiveness Decreased required effective dose Improved pain control End dependence on high dose therapy Decrease cost of therapy Uncovering "opioid-induced hyperalgesia". (OIH)  What is "opioid hyperalgesia"? It is a paradoxical increase in pain caused by exposure to opioids. Stopping the opioid pain medication, contrary to the expected, it actually decreases or completely eliminates the pain. Ref.: "A comprehensive review of opioid-induced hyperalgesia". Marion Lee, et.al. Pain Physician. 2011 Mar-Apr;14(2):145-61.  What is tolerance? Tolerance: the progressive loss of effectiveness of a pain medicine due to repetitive use. A common problem of opioid pain medications.  How long should a "Drug  Holiday" last? Effectiveness depends on the patient staying off all opioid pain medicines for a minimum of 14 consecutive days. (2 weeks)  How about just taking less of the medicine? Does not work. Will not accomplish goal of eliminating the excess receptors.  How about switching to a different pain medicine? (AKA. "Opioid rotation") Does not work. Creates the illusion of effectiveness by taking advantage of inaccurate equivalent dose calculations between different opioids. -This "technique" was promoted by studies funded by pharmaceutical companies, such as [employer], creators of "OxyContin".  Can I stop the medicine "cold turkey"? Depends. You should always coordinate with your Pain Specialist to make the transition as smoothly as possible. Avoid stopping the medicine abruptly without consulting. We recommend a "slow taper".  What is a slow taper? Taper: refers to the gradual decrease in dose.   How do I stop/taper the dose? Slowly. Decrease the daily amount of pills that you take by one (1) pill every seven (7) days. This is called a "slow downward taper". Example: if you normally take four (4) pills per day, drop it to three (3) pills per day for seven (7) days, then to two (2) pills per day for seven (7) days, then to one (1) per day for seven (7) days, and then stop the medicine. The 14 day "Drug Holiday" starts on the first day without medicine.   Will I experience withdrawals? Unlikely with a slow taper.  What triggers withdrawals? Withdrawals are triggered by the sudden/abrupt stop of high dose opioids. Withdrawals can be avoided by slowly decreasing the dose over a prolonged period of time.  What are withdrawals? Symptoms associated with sudden/abrupt reduction/stopping of high-dose, long-term use of pain medication. Withdrawal are seldom seen on low dose therapy, or patients rarely taking opioid medication.  Early Withdrawal Symptoms may include: Agitation Anxiety Muscle  aches Increased tearing Insomnia Runny nose Sweating Yawning  Late symptoms may include: Abdominal cramping Diarrhea Dilated pupils Goose bumps Nausea Vomiting  (Last update: 07/02/2022) ____________________________________________________________________________________________    _______________________________________________________________________  Medication Rules  Purpose: To inform patients, and their family members, of our medication rules and regulations.  Applies to: All patients receiving prescriptions from our practice (written or electronic).  Pharmacy of record: This is the pharmacy where your electronic prescriptions will be sent. Make sure we have the correct one.  Electronic prescriptions: In compliance with the [place] Strengthen Opioid Misuse Prevention (STOP) Act of 2017 (Session Law 2017-74/H243), effective July 24, 2018, all controlled substances must be electronically prescribed. Written prescriptions, faxing, or calling prescriptions to a pharmacy will no longer be done.  Prescription refills: These will be provided only during in-person appointments. No medications will be renewed without a "face-to-face" evaluation with your provider. Applies to all prescriptions.  NOTE: The following applies primarily to controlled substances (Opioid* Pain Medications).   Type of encounter (visit): For patients receiving controlled substances, face-to-face visits are required. (Not an option and not up to the patient.)  Patient's responsibilities: Pain Pills: Bring all pain pills to every appointment (except for procedure appointments). Pill Bottles: Bring pills in original pharmacy bottle. Bring bottle, even if empty. Always bring the bottle of the most recent fill.  Medication refills: You are responsible for knowing and keeping track of what medications you are taking and when is it that you will need a refill. The day before your appointment: write a  list of all prescriptions that need to be refilled. The day of the appointment: give the list to the admitting nurse. Prescriptions will be written only during appointments. No prescriptions will be written on procedure days. If you forget a medication: it will not be "Called in", "Faxed", or "electronically sent". You will need to get another appointment to get these prescribed. No early refills. Do not call asking to have your prescription filled early. Partial  or short prescriptions: Occasionally your pharmacy may not have enough pills to fill your prescription.  NEVER ACCEPT a partial fill or a prescription that is short of the total amount of pills that you were prescribed.  With controlled substances the law allows 72 hours for the pharmacy to complete the prescription.  If the prescription is not completed within 72 hours, the pharmacist will require a new prescription to be written. This means that you will be short on your medicine and we WILL NOT send another prescription to complete your original prescription.  Instead, request the pharmacy to send a carrier to a nearby branch to get enough medication to provide you with your full prescription. Prescription Accuracy: You are responsible for carefully inspecting your prescriptions before leaving our office. Have the discharge nurse carefully go over each prescription with you, before taking them home. Make sure that your name is accurately spelled, that your address is correct. Check the name and dose of your medication to make sure it is accurate. Check the number of pills, and the written instructions to make sure they are clear and accurate. Make sure that you are given enough medication to last until your next medication refill appointment. Taking Medication: Take medication as prescribed. When it comes to controlled substances, taking less pills or less frequently than prescribed is permitted and encouraged. Never take more pills than  instructed. Never take the medication more frequently than prescribed.  Inform other Doctors: Always inform, all of your healthcare providers, of all the medications you take. Pain Medication from other Providers: You are not allowed to accept any additional pain medication from any other Doctor or Healthcare provider. There are two exceptions to this rule. (see below) In the event that you require additional pain medication, you are responsible for notifying us, as stated below. Cough Medicine: Often these contain an opioid, such as codeine or hydrocodone. Never accept or take cough medicine containing these opioids if you are already taking an opioid* medication. The combination may cause respiratory failure and death. Medication Agreement: You are responsible for carefully reading and following our Medication Agreement. This   must be signed before receiving any prescriptions from our practice. Safely store a copy of your signed Agreement. Violations to the Agreement will result in no further prescriptions. (Additional copies of our Medication Agreement are available upon request.) Laws, Rules, & Regulations: All patients are expected to follow all Federal and State Laws, Statutes, Rules, & Regulations. Ignorance of the Laws does not constitute a valid excuse.  Illegal drugs and Controlled Substances: The use of illegal substances (including, but not limited to marijuana and its derivatives) and/or the illegal use of any controlled substances is strictly prohibited. Violation of this rule may result in the immediate and permanent discontinuation of any and all prescriptions being written by our practice. The use of any illegal substances is prohibited. Adopted CDC guidelines & recommendations: Target dosing levels will be at or below 60 MME/day. Use of benzodiazepines** is not recommended.  Exceptions: There are only two exceptions to the rule of not receiving pain medications from other Healthcare  Providers. Exception #1 (Emergencies): In the event of an emergency (i.e.: accident requiring emergency care), you are allowed to receive additional pain medication. However, you are responsible for: As soon as you are able, call our office [phone], at any time of the day or night, and leave a message stating your name, the date and nature of the emergency, and the name and dose of the medication prescribed. In the event that your call is answered by a member of our staff, make sure to document and save the date, time, and the name of the person that took your information.  Exception #2 (Planned Surgery): In the event that you are scheduled by another doctor or dentist to have any type of surgery or procedure, you are allowed (for a period no longer than 30 days), to receive additional pain medication, for the acute post-op pain. However, in this case, you are responsible for picking up a copy of our "Post-op Pain Management for Surgeons" handout, and giving it to your surgeon or dentist. This document is available at our office, and does not require an appointment to obtain it. Simply go to our office during business hours (Monday-Thursday from 8:00 AM to 4:00 PM) (Friday 8:00 AM to 12:00 Noon) or if you have a scheduled appointment with us, prior to your surgery, and ask for it by name. In addition, you are responsible for: calling our office [phone], at any time of the day or night, and leaving a message stating your name, name of your surgeon, type of surgery, and date of procedure or surgery. Failure to comply with your responsibilities may result in termination of therapy involving the controlled substances. Medication Agreement Violation. Following the above rules, including your responsibilities will help you in avoiding a Medication Agreement Violation ("Breaking your Pain Medication Contract").  Consequences:  Not following the above rules may result in permanent discontinuation of  medication prescription therapy.  *Opioid medications include: morphine, codeine, oxycodone, oxymorphone, hydrocodone, hydromorphone, meperidine, tramadol, tapentadol, buprenorphine, fentanyl, methadone. **Benzodiazepine medications include: diazepam (Valium), alprazolam (Xanax), clonazepam (Klonopine), lorazepam (Ativan), clorazepate (Tranxene), chlordiazepoxide (Librium), estazolam (Prosom), oxazepam (Serax), temazepam (Restoril), triazolam (Halcion) (Last updated: 05/16/2022) ______________________________________________________________________    ______________________________________________________________________  Medication Recommendations and Reminders  Applies to: All patients receiving prescriptions (written and/or electronic).  Medication Rules & Regulations: You are responsible for reading, knowing, and following our "Medication Rules" document. These exist for your safety and that of others. They are not flexible and neither are we. Dismissing or ignoring them is an   act of "non-compliance" that may result in complete and irreversible termination of such medication therapy. For safety reasons, "non-compliance" will not be tolerated. As with the [place] fundamental legal principle of "ignorance of the law is no defense", we will accept no excuses for not having read and knowing the content of documents provided to you by our practice.  Pharmacy of record:  Definition: This is the pharmacy where your electronic prescriptions will be sent.  We do not endorse any particular pharmacy. It is up to you and your insurance to decide what pharmacy to use.  We do not restrict you in your choice of pharmacy. However, once we write for your prescriptions, we will NOT be re-sending more prescriptions to fix restricted supply problems created by your pharmacy, or your insurance.  The pharmacy listed in the electronic medical record should be the one where you want electronic prescriptions to be  sent. If you choose to change pharmacy, simply notify our nursing staff. Changes will be made only during your regular appointments and not over the phone.  Recommendations: Keep all of your pain medications in a safe place, under lock and key, even if you live alone. We will NOT replace lost, stolen, or damaged medication. We do not accept "Police Reports" as proof of medications having been stolen. After you fill your prescription, take 1 week's worth of pills and put them away in a safe place. You should keep a separate, properly labeled bottle for this purpose. The remainder should be kept in the original bottle. Use this as your primary supply, until it runs out. Once it's gone, then you know that you have 1 week's worth of medicine, and it is time to come in for a prescription refill. If you do this correctly, it is unlikely that you will ever run out of medicine. To make sure that the above recommendation works, it is very important that you make sure your medication refill appointments are scheduled at least 1 week before you run out of medicine. To do this in an effective manner, make sure that you do not leave the office without scheduling your next medication management appointment. Always ask the nursing staff to show you in your prescription , when your medication will be running out. Then arrange for the receptionist to get you a return appointment, at least 7 days before you run out of medicine. Do not wait until you have 1 or 2 pills left, to come in. This is very poor planning and does not take into consideration that we may need to cancel appointments due to bad weather, sickness, or emergencies affecting our staff. DO NOT ACCEPT A "Partial Fill": If for any reason your pharmacy does not have enough pills/tablets to completely fill or refill your prescription, do not allow for a "partial fill". The law allows the pharmacy to complete that prescription within 72 hours, without requiring a new  prescription. If they do not fill the rest of your prescription within those 72 hours, you will need a separate prescription to fill the remaining amount, which we will NOT provide. If the reason for the partial fill is your insurance, you will need to talk to the pharmacist about payment alternatives for the remaining tablets, but again, DO NOT ACCEPT A PARTIAL FILL, unless you can trust your pharmacist to obtain the remainder of the pills within 72 hours.  Prescription refills and/or changes in medication(s):  Prescription refills, and/or changes in dose or medication, will be conducted only   during scheduled medication management appointments. (Applies to both, written and electronic prescriptions.) No refills on procedure days. No medication will be changed or started on procedure days. No changes, adjustments, and/or refills will be conducted on a procedure day. Doing so will interfere with the diagnostic portion of the procedure. No phone refills. No medications will be "called into the pharmacy". No Fax refills. No weekend refills. No Holliday refills. No after hours refills.  Remember:  Business hours are:  Monday to Thursday 8:00 AM to 4:00 PM Provider's Schedule: Joscelyne Renville, MD - Appointments are:  Medication management: Monday and Wednesday 8:00 AM to 4:00 PM Procedure day: Tuesday and Thursday 7:30 AM to 4:00 PM Bilal Lateef, MD - Appointments are:  Medication management: Tuesday and Thursday 8:00 AM to 4:00 PM Procedure day: Monday and Wednesday 7:30 AM to 4:00 PM (Last update: 05/16/2022) ______________________________________________________________________    ____________________________________________________________________________________________  WARNING: CBD (cannabidiol) & Delta (Delta-8 tetrahydrocannabinol) products.   Applicable to:  All individuals currently taking or considering taking CBD (cannabidiol) and, more important, all patients taking opioid  analgesic controlled substances (pain medication). (Example: oxycodone; oxymorphone; hydrocodone; hydromorphone; morphine; methadone; tramadol; tapentadol; fentanyl; buprenorphine; butorphanol; dextromethorphan; meperidine; codeine; etc.)  Introduction:  Recently there has been a drive towards the use of "natural" products for the treatment of different conditions, including pain anxiety and sleep disorders. Marijuana and hemp are two varieties of the cannabis genus plants. Marijuana and its derivatives are illegal, while hemp and its derivatives are not. Cannabidiol (CBD) and tetrahydrocannabinol (THC), are two natural compounds found in plants of the Cannabis genus. They can both be extracted from hemp or marijuana. Both compounds interact with your body's endocannabinoid system in very different ways. CBD is associated with pain relief (analgesia) while THC is associated with the psychoactive effects ("the high") obtained from the use of marijuana products. There are two main types of THC: Delta-9, which comes from the marijuana plant and it is illegal, and Delta-8, which comes from the hemp plant, and it is legal. (Both, Delta-9-THC and Delta-8-THC are psychoactive and give you "the high".)   Legality:  Marijuana and its derivatives: illegal Hemp and its derivatives: Legal (State dependent) UPDATE: (09/09/2021) The Drug Enforcement Agency (DEA) issued a letter stating that "delta" cannabinoids, including Delta-8-THCO and Delta-9-THCO, synthetically derived from hemp do not qualify as hemp and will be viewed as Schedule I drugs. (Schedule I drugs, substances, or chemicals are defined as drugs with no currently accepted medical use and a high potential for abuse. Some examples of Schedule I drugs are: heroin, lysergic acid diethylamide (LSD), marijuana (cannabis), 3,4-methylenedioxymethamphetamine (ecstasy), methaqualone, and peyote.) (https://www.dea.gov)  Legal status of CBD in [place]:  "Conditionally  Legal"  Reference: "FDA Regulation of Cannabis and Cannabis-Derived Products, Including Cannabidiol (CBD)" - https://www.fda.gov/news-events/public-health-focus/fda-regulation-cannabis-and-cannabis-derived-products-including-cannabidiol-cbd  Warning:  CBD is not FDA approved and has not undergo the same manufacturing controls as prescription drugs.  This means that the purity and safety of available CBD may be questionable. Most of the time, despite manufacturer's claims, it is contaminated with THC (delta-9-tetrahydrocannabinol - the chemical in marijuana responsible for the "HIGH").  When this is the case, the THC contaminant will trigger a positive urine drug screen (UDS) test for Marijuana (carboxy-THC).   The FDA recently put out a warning about 5 things that everyone should be aware of regarding Delta-8 THC: Delta-8 THC products have not been evaluated or approved by the FDA for safe use and may be marketed in ways that put the public health at   risk. The FDA has received adverse event reports involving delta-8 THC-containing products. Delta-8 THC has psychoactive and intoxicating effects. Delta-8 THC manufacturing often involve use of potentially harmful chemicals to create the concentrations of delta-8 THC claimed in the marketplace. The final delta-8 THC product may have potentially harmful by-products (contaminants) due to the chemicals used in the process. Manufacturing of delta-8 THC products may occur in uncontrolled or unsanitary settings, which may lead to the presence of unsafe contaminants or other potentially harmful substances. Delta-8 THC products should be kept out of the reach of children and pets.  NOTE: Because a positive UDS for any illicit substance is a violation of our medication agreement, your opioid analgesics (pain medicine) may be permanently discontinued.  MORE ABOUT CBD  General Information: CBD was discovered in 1940 and it is a derivative of the cannabis sativa  genus plants (Marijuana and Hemp). It is one of the 113 identified substances found in Marijuana. It accounts for up to 40% of the plant's extract. As of 2018, preliminary clinical studies on CBD included research for the treatment of anxiety, movement disorders, and pain. CBD is available and consumed in multiple forms, including inhalation of smoke or vapor, as an aerosol spray, and by mouth. It may be supplied as an oil containing CBD, capsules, dried cannabis, or as a liquid solution. CBD is thought not to be as psychoactive as THC (delta-9-tetrahydrocannabinol - the chemical in marijuana responsible for the "HIGH"). Studies suggest that CBD may interact with different biological target receptors in the body, including cannabinoid and other neurotransmitter receptors. As of 2018 the mechanism of action for its biological effects has not been determined.  Side-effects  Adverse reactions: Dry mouth, diarrhea, decreased appetite, fatigue, drowsiness, malaise, weakness, sleep disturbances, and others.  Drug interactions:  CBD may interact with medications such as blood-thinners. CBD causes drowsiness on its own and it will increase drowsiness caused by other medications, including antihistamines (such as Benadryl), benzodiazepines (Xanax, Ativan, Valium), antipsychotics, antidepressants, opioids, alcohol and supplements such as kava, melatonin and St. John's Wort.  Other drug interactions: Brivaracetam (Briviact); Caffeine; Carbamazepine (Tegretol); Citalopram (Celexa); Clobazam (Onfi); Eslicarbazepine (Aptiom); Everolimus (Zostress); Lithium; Methadone (Dolophine); Rufinamide (Banzel); Sedative medications (CNS depressants); Sirolimus (Rapamune); Stiripentol (Diacomit); Tacrolimus (Prograf); Tamoxifen ; Soltamox); Topiramate (Topamax); Valproate; Warfarin (Coumadin); Zonisamide. (Last update: 07/03/2022) ____________________________________________________________________________________________    ____________________________________________________________________________________________  Naloxone Nasal Spray  Why am I receiving this medication? [place] STOP ACT requires that all patients taking high dose opioids or at risk of opioids respiratory depression, be prescribed an opioid reversal agent, such as Naloxone (AKA: Narcan).  What is this medication? NALOXONE (nal OX one) treats opioid overdose, which causes slow or shallow breathing, severe drowsiness, or trouble staying awake. Call emergency services after using this medication. You may need additional treatment. Naloxone works by reversing the effects of opioids. It belongs to a group of medications called opioid blockers.  COMMON BRAND NAME(S): Kloxxado, Narcan  What should I tell my care team before I take this medication? They need to know if you have any of these conditions: Heart disease Substance use disorder An unusual or allergic reaction to naloxone, other medications, foods, dyes, or preservatives Pregnant or trying to get pregnant Breast-feeding  When to use this medication? This medication is to be used for the treatment of respiratory depression (less than 8 breaths per minute) secondary to opioid overdose.   How to use this medication? This medication is for use in the nose. Lay the person on their   back. Support their neck with your hand and allow the head to tilt back before giving the medication. The nasal spray should be given into 1 nostril. After giving the medication, move the person onto their side. Do not remove or test the nasal spray until ready to use. Get emergency medical help right away after giving the first dose of this medication, even if the person wakes up. You should be familiar with how to recognize the signs and symptoms of a narcotic overdose. If more doses are needed, give the additional dose in the other nostril. Talk to your care team about the use of this medication in children.  While this medication may be prescribed for children as young as newborns for selected conditions, precautions do apply.  Naloxone Overdosage: If you think you have taken too much of this medicine contact a poison control center or emergency room at once.  NOTE: This medicine is only for you. Do not share this medicine with others.  What if I miss a dose? This does not apply.  What may interact with this medication? This is only used during an emergency. No interactions are expected during emergency use. This list may not describe all possible interactions. Give your health care provider a list of all the medicines, herbs, non-prescription drugs, or dietary supplements you use. Also tell them if you smoke, drink alcohol, or use illegal drugs. Some items may interact with your medicine.  What should I watch for while using this medication? Keep this medication ready for use in the case of an opioid overdose. Make sure that you have the phone number of your care team and local hospital ready. You may need to have additional doses of this medication. Each nasal spray contains a single dose. Some emergencies may require additional doses. After use, bring the treated person to the nearest hospital or call 911. Make sure the treating care team knows that the person has received a dose of this medication. You will receive additional instructions on what to do during and after use of this medication before an emergency occurs.  What side effects may I notice from receiving this medication? Side effects that you should report to your care team as soon as possible: Allergic reactions--skin rash, itching, hives, swelling of the face, lips, tongue, or throat Side effects that usually do not require medical attention (report these to your care team if they continue or are bothersome): Constipation Dryness or irritation inside the nose Headache Increase in blood pressure Muscle spasms Stuffy  nose Toothache This list may not describe all possible side effects. Call your doctor for medical advice about side effects. You may report side effects to FDA at 1-800-FDA-1088.  Where should I keep my medication? Because this is an emergency medication, you should keep it with you at all times.  Keep out of the reach of children and pets. Store between 20 and 25 degrees C (68 and 77 degrees F). Do not freeze. Throw away any unused medication after the expiration date. Keep in original box until ready to use.  NOTE: This sheet is a summary. It may not cover all possible information. If you have questions about this medicine, talk to your doctor, pharmacist, or health care provider.   2023 Elsevier/Gold Standard (2021-03-18 00:00:00)  ____________________________________________________________________________________________   

## 2022-08-24 ENCOUNTER — Ambulatory Visit
Admission: RE | Admit: 2022-08-24 | Discharge: 2022-08-24 | Disposition: A | Discharge status: Home / Self Care | Payer: Medicare HMO | Source: Ambulatory Visit | Attending: Internal Medicine | Admitting: Internal Medicine

## 2022-08-24 DIAGNOSIS — Z1231 Encounter for screening mammogram for malignant neoplasm of breast: Secondary | ICD-10-CM | POA: Diagnosis not present

## 2022-09-06 ENCOUNTER — Encounter: Payer: Self-pay | Admitting: Internal Medicine

## 2022-09-06 ENCOUNTER — Ambulatory Visit (INDEPENDENT_AMBULATORY_CARE_PROVIDER_SITE_OTHER): Payer: Medicare HMO | Admitting: Internal Medicine

## 2022-09-06 VITALS — BP 126/68 | HR 69 | Temp 98.1°F | Resp 18 | Ht 64.0 in | Wt 233.5 lb

## 2022-09-06 DIAGNOSIS — Z6841 Body Mass Index (BMI) 40.0 and over, adult: Secondary | ICD-10-CM | POA: Diagnosis not present

## 2022-09-06 DIAGNOSIS — E785 Hyperlipidemia, unspecified: Secondary | ICD-10-CM | POA: Diagnosis not present

## 2022-09-06 DIAGNOSIS — I10 Essential (primary) hypertension: Secondary | ICD-10-CM

## 2022-09-06 DIAGNOSIS — G894 Chronic pain syndrome: Secondary | ICD-10-CM

## 2022-09-06 DIAGNOSIS — Z23 Encounter for immunization: Secondary | ICD-10-CM

## 2022-09-06 DIAGNOSIS — F32A Depression, unspecified: Secondary | ICD-10-CM | POA: Diagnosis not present

## 2022-09-06 DIAGNOSIS — M461 Sacroiliitis, not elsewhere classified: Secondary | ICD-10-CM

## 2022-09-06 MED ORDER — VITAMIN D (ERGOCALCIFEROL) 1.25 MG (50000 UNIT) PO CAPS: 50000.0000 [IU] | ORAL_CAPSULE | ORAL | 0 refills | Status: DC

## 2022-09-06 NOTE — Assessment & Plan Note (Signed)
HTN: Seems well-controlled, continue HCTZ, lisinopril.  Check BMP High cholesterol: Last LDL 151, atorvastatin increased from 40 mg to 80 mg.  Check FLP AST ALT Vitamin D deficiency: Last vitamin D low, Rx ergocalciferol but never picked it up, prescription printed.  Also recommend  OTCs. MSK: Discharge from pain management clinic due to noncompliance with follow-up.  Unfortunately, situation at home has been very difficult due to taking care of her husband with dementia.  Will refer her to a new pain management doctor. I will consider refill medication temporarily until she get established with a different doctor Depression: PHQ-9: Score 15. Has been on sertraline 200 mg for a while, she is somewhat overwhelmed and depressed about the situation at home.  Denies any suicidal ideas.  Offered additional medication.  Declined .  Asked to keep me posted. Social: Household >>  pt, husband w/ dementia [place] is the main caregiver), a daughter live w/ them  RTC 3 months

## 2022-09-06 NOTE — Progress Notes (Signed)
Subjective:    Patient ID: Wanda [name], female    DOB: January 09, 1946, 77 y.o.   MRN: [id number]  DOS:  09/06/2022 Type of visit - description: f/u  Routine follow-up We discussed several issues. The main concern for the patient is depression.  She takes care of her husband who has dementia and a number of other issues. Also, was dismissed from pain management.   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   Qualifier: Diagnosis of  By: Larose Kells MD, [place]     Past Surgical History:  Procedure Laterality Date   G3 P3       Current Outpatient Medications  Medication Instructions   atorvastatin (LIPITOR) 80 mg, Oral, Daily at bedtime   calcium carbonate (CALCIUM 600) 600 mg, Oral, 2 times daily with meals   cyclobenzaprine (FLEXERIL) 5 mg, Oral, 3 times daily PRN   diclofenac Sodium (VOLTAREN) 2 g, Topical, 4 times daily   gabapentin (NEURONTIN) 300 mg, Oral, 3 times daily   hydrochlorothiazide (HYDRODIURIL) 25 MG tablet TAKE 1 TABLET(25 MG) BY MOUTH DAILY   HYDROcodone-acetaminophen (NORCO/VICODIN) 5-325 MG tablet 1 tablet, Oral, 3 times daily PRN, Must last 30 days   lisinopril (ZESTRIL) 10 MG tablet TAKE 1 TABLET(10 MG) BY MOUTH DAILY   naloxone (NARCAN) nasal spray 4 mg/0.1 mL 1 spray, Nasal, As needed, In case of emergency (overdose), spray once into each nostril. If no response within 3 minutes, repeat application and call [phone].   sertraline (ZOLOFT) 200 mg, Oral, Daily   Vitamin D (Ergocalciferol) (DRISDOL) 50,000 Units, Oral, Every 7 days, For 12 weeks   Vitamin D 2,000 Units, Oral, Daily       Objective:   Physical Exam BP 126/68   Pulse 69   Temp 98.1 F (36.7 C) (Oral)   Resp 18   Ht 5' 4"$   (1.626 m)   Wt 233 lb 8 oz (105.9 kg)   SpO2 98%   BMI 40.08 kg/m  General:   Well developed, NAD, BMI noted. HEENT:  Normocephalic . Face symmetric, atraumatic Lungs:  CTA B Normal respiratory effort, no intercostal retractions, no accessory muscle use. Heart: RRR,  no murmur.  Lower extremities: no pretibial edema bilaterally  Skin: Not pale. Not jaundice Neurologic:  alert & oriented X3.  Speech normal, gait appropriate for age   Psych--  Cognition and judgment appear intact.  Cooperative with normal attention span and concentration.  Behavior appropriate. Tearful, more depressed and anxious     Assessment      Assessment HTN Hyperlipidemia Depression Morbid obesity MSK: DJD, chronic back-knee pain, hydrocodone /gabapentin rx by DR Shelly Rubenstein D  Def  PLAN:  HTN: Seems well-controlled, continue HCTZ, lisinopril.  Check BMP High cholesterol: Last LDL 151, atorvastatin increased from 40 mg to 80 mg.  Check FLP AST ALT Vitamin D deficiency: Last vitamin D low, Rx ergocalciferol but never picked it up, prescription printed.  Also recommend  OTCs. MSK: Discharge from pain management clinic due to noncompliance with follow-up.  Unfortunately, situation at home has been very difficult due to taking care of her husband with  dementia.  Will refer her to a new pain management doctor. I will consider refill medication temporarily until she get established with a different doctor Depression: PHQ-9: Score 15. Has been on sertraline 200 mg for a while, she is somewhat overwhelmed and depressed about the situation at home.  Denies any suicidal ideas.  Offered additional medication.  Declined .  Asked to keep me posted. Social: Household >>  pt, husband w/ dementia Self is the main caregiver), a daughter live w/ them  RTC 3 months

## 2022-09-06 NOTE — Patient Instructions (Addendum)
We are referring you to a new pain management doctor  Start taking ergocalciferol 1 tablet weekly.  This is a strong vitamin D. Also take over-the-counter vitamin D 2000 units daily   Check the  blood pressure regularly BP GOAL is between 110/65 and  135/85. If it is consistently higher or lower, let me know    GO TO THE LAB : Get the blood work     [name], Wanda back for   checkup in 3 months  Vaccines I recommend:  Covid booster Shingrix (shingles) RSV vaccine

## 2022-09-17 ENCOUNTER — Other Ambulatory Visit: Payer: Self-pay | Admitting: Internal Medicine

## 2022-12-13 ENCOUNTER — Ambulatory Visit (INDEPENDENT_AMBULATORY_CARE_PROVIDER_SITE_OTHER): Payer: Medicare HMO | Admitting: Internal Medicine

## 2022-12-13 ENCOUNTER — Encounter: Payer: Self-pay | Admitting: Internal Medicine

## 2022-12-13 VITALS — BP 118/72 | HR 75 | Temp 98.1°F | Resp 18 | Ht 64.0 in | Wt 226.0 lb

## 2022-12-13 DIAGNOSIS — I10 Essential (primary) hypertension: Secondary | ICD-10-CM

## 2022-12-13 DIAGNOSIS — M25562 Pain in left knee: Secondary | ICD-10-CM

## 2022-12-13 DIAGNOSIS — F32A Depression, unspecified: Secondary | ICD-10-CM | POA: Diagnosis not present

## 2022-12-13 DIAGNOSIS — G629 Polyneuropathy, unspecified: Secondary | ICD-10-CM

## 2022-12-13 DIAGNOSIS — E785 Hyperlipidemia, unspecified: Secondary | ICD-10-CM

## 2022-12-13 LAB — COMPREHENSIVE METABOLIC PANEL
ALT: 12 U/L (ref 0–35)
AST: 16 U/L (ref 0–37)
Albumin: 4.2 g/dL (ref 3.5–5.2)
Alkaline Phosphatase: 82 U/L (ref 39–117)
BUN: 11 mg/dL (ref 6–23)
CO2: 28 mEq/L (ref 19–32)
Calcium: 9.9 mg/dL (ref 8.4–10.5)
Chloride: 101 mEq/L (ref 96–112)
Creatinine, Ser: 0.69 mg/dL (ref 0.40–1.20)
GFR: 85.46 mL/min (ref >60.00)
Glucose, Bld: 90 mg/dL (ref 70–99)
Potassium: 3.7 mEq/L (ref 3.5–5.1)
Sodium: 138 mEq/L (ref 135–145)
Total Bilirubin: 0.4 mg/dL (ref 0.2–1.2)
Total Protein: 7.2 g/dL (ref 6.0–8.3)

## 2022-12-13 LAB — LIPID PANEL
Cholesterol: 225 mg/dL — ABNORMAL HIGH (ref 0–200)
HDL: 50.9 mg/dL (ref >39.00)
LDL Cholesterol: 160 mg/dL — ABNORMAL HIGH (ref 0–99)
NonHDL: 173.99
Total CHOL/HDL Ratio: 4
Triglycerides: 70 mg/dL (ref 0.0–149.0)
VLDL: 14 mg/dL (ref 0.0–40.0)

## 2022-12-13 LAB — B12 AND FOLATE PANEL
Folate: 12.9 ng/mL (ref >5.9)
Vitamin B-12: 420 pg/mL (ref 211–911)

## 2022-12-13 MED ORDER — CYCLOBENZAPRINE HCL 10 MG PO TABS: 10.0000 mg | ORAL_TABLET | Freq: Every evening | ORAL | 2 refills | Status: AC | PRN

## 2022-12-13 MED ORDER — BENZONATATE 200 MG PO CAPS: 200.0000 mg | ORAL_CAPSULE | Freq: Three times a day (TID) | ORAL | 0 refills | Status: DC | PRN

## 2022-12-13 NOTE — Patient Instructions (Addendum)
For cough  Continue Robitussin DM as you are doing Also take Tessalon Perles 200 mg 3 times a day as needed.  We are referring you to a orthopedic doctor for your knee pain.  For the numbness in your hands I'm doing some blood work.  Will refer you to another specialist if needed at the next visit if needed.  I am refilling a muscle relaxant called Flexeril, take one at nighttime as needed  GO TO THE LAB : Get the blood work    GO TO THE FRONT DESK, PLEASE SCHEDULE YOUR APPOINTMENTS Come back for a physical exam in 4 months   vaccines I recommend: Shingrix (shingles) RSV vaccine

## 2022-12-13 NOTE — Progress Notes (Unsigned)
Subjective:    Patient ID: Wanda [name], female    DOB: 1945/12/16, 77 y.o.   MRN: [id number]  DOS:  12/13/2022 Type of visit - description: f/u  Multiple issues discussed including pain management, depression, cough.  As far as the patient states she is coping. Has not been able to stay with pain management, complaining of left knee pain, request a referral. New issue: Reports a long history of hand paresthesias, typically bilaterally, not necessarily worse at the ulnar side. Denies neck pain, no wrist or elbow pain. Had a URI to 3 weeks ago, never had fever or chills.  Currently better but has a persistent cough with a small amount of off-color sputum production.  No wheezing.    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     Past Surgical History:  Procedure Laterality Date   G3 P3       Current Outpatient Medications  Medication Instructions   atorvastatin (LIPITOR) 80 mg, Oral, Daily at bedtime   calcium carbonate (CALCIUM 600) 600 mg, Oral, 2 times daily with meals   cyclobenzaprine (FLEXERIL) 5 mg, Oral, 3 times daily PRN   diclofenac Sodium (VOLTAREN) 2 g, Topical, 4 times daily   gabapentin (NEURONTIN) 300 mg, Oral, 3 times daily   hydrochlorothiazide (HYDRODIURIL) 25 MG tablet TAKE 1 TABLET(25 MG) BY MOUTH DAILY   HYDROcodone-acetaminophen (NORCO/VICODIN) 5-325 MG tablet 1 tablet, Oral, 3 times daily PRN, Must last 30 days   lisinopril (ZESTRIL) 10 MG tablet TAKE 1 TABLET(10 MG) BY MOUTH DAILY   naloxone (NARCAN) nasal spray 4 mg/0.1 mL 1 spray, Nasal, As needed, In case of emergency (overdose), spray once into each nostril. If no response  within 3 minutes, repeat application and call 911.   sertraline (ZOLOFT) 200 mg, Oral, Daily   Vitamin D (Ergocalciferol) (DRISDOL) 50,000 Units, Oral, Every 7 days, For 12 weeks   Vitamin D 2,000 Units, Oral, Daily       Objective:   Physical Exam BP 118/72   Pulse 75   Temp 98.1 F (36.7 C) (Oral)   Resp 18   Ht 5\' 4"  (1.626 m)   Wt 226 lb (102.5 kg)   SpO2 97%   BMI 38.79 kg/m  General:   Well developed, NAD, BMI noted. HEENT:  Normocephalic . Face symmetric, atraumatic. TMs normal.  Nose slightly congested Lungs:  CTA B Normal respiratory effort, no intercostal retractions, no accessory muscle use. Heart: RRR,  no murmur.  Lower extremities:  knees with + changes consistent with DJD. Upper extremities: No synovitis at hands or wrists.  Changes consistent with DJD Skin: Not pale. Not jaundice Neurologic:  alert & oriented X3.  Speech normal, gait quite limited by BMI knee pain.  Uses a cane. Motor and DTRs symmetric. Psych--  Cognition and judgment appear intact.  Cooperative with normal attention span and concentration.  Behavior appropriate. No anxious or depressed appearing.      Assessment    Assessment HTN Hyperlipidemia Depression Morbid obesity MSK: DJD, chronic back-knee pain, hydrocodone /gabapentin rx by  DR Lindell Noe  Def  PLAN:  CMP FLP B12 folic acid RPR orthopedic referral  HTN:BP today is great, continue CTC, lisinopril.  Check a CMP Hyperlipidemia: On Lipitor 80 mg, check FLP. Depression: PHQ-9 today scored 13, better than before, overall states that she is coping with the situation at home (she is the caregiver for her husband).  Continue sertraline. MSK: Was referred to a new pain management doctor, has not been able to establish with a new physician. Currently with a lot of left knee pain. Plan: Refer to Ortho for knee pain evaluation, encouraged to use Tylenol (she ran out of hydrocodone), continue gabapentin, RF cyclobenzaprine  which she takes at night with some help. Paresthesias, hands: New issue, for now we will check a B12 folic acid and RPR, at some point will need nerve conduction study. Cough: After a URI, exam is benign, continue Robitussin, and Tessalon Perles. Vitamin D deficiency: S/p ergocalciferol, now on OTCs. RTC 4 months CPX  2== HTN: Seems well-controlled, continue HCTZ, lisinopril.  Check BMP High cholesterol: Last LDL 151, atorvastatin increased from 40 mg to 80 mg.  Check FLP AST ALT Vitamin D deficiency: Last vitamin D low, Rx ergocalciferol but never picked it up, prescription printed.  Also recommend  OTCs. MSK: Discharge from pain management clinic due to noncompliance with follow-up.  Unfortunately, situation at home has been very difficult due to taking care of her husband with dementia.  Will refer her to a new pain management doctor. I will consider refill medication temporarily until she get established with a different doctor Depression: PHQ-9: Score 15. Has been on sertraline 200 mg for a while, she is somewhat overwhelmed and depressed about the situation at home.  Denies any suicidal ideas.  Offered additional medication.  Declined .  Asked to keep me posted. Social: Household >>  pt, husband w/ dementia [place] is the main caregiver), a daughter live w/ them  RTC 3 months

## 2022-12-14 LAB — RPR: RPR Ser Ql: NONREACTIVE

## 2022-12-14 NOTE — Assessment & Plan Note (Signed)
HTN:BP today is great, continue HCTZ, lisinopril.  Check a CMP Hyperlipidemia: On Lipitor 80 mg, check FLP.  Further advised with results Depression: PHQ-9 today scored 13, better than before, overall states that she is coping with the situation at home (she is the caregiver for her husband).  Continue sertraline. MSK: Was referred to a new pain management doctor, has not been able to establish with a new physician. Currently with a lot of left knee pain. Plan: Refer to Ortho for knee pain evaluation, encouraged to use Tylenol ( has ran out of hydrocodone), continue gabapentin, RF cyclobenzaprine which she takes at night with some help. Paresthesias, hands: New issue, for now we will check a B12 folic acid and RPR, at some point will need nerve conduction study if etiology needs to be clarified.. Cough: After a URI, exam is benign, continue Robitussin, and Tessalon Perles. Vitamin D deficiency: S/p ergocalciferol, now on OTCs. RTC 4 months CPX

## 2022-12-20 ENCOUNTER — Telehealth: Payer: Self-pay

## 2022-12-20 ENCOUNTER — Inpatient Hospital Stay: Admission: RE | Admit: 2022-12-20 | Payer: Medicare HMO | Source: Ambulatory Visit

## 2022-12-20 ENCOUNTER — Other Ambulatory Visit: Payer: Self-pay

## 2022-12-20 MED ORDER — EZETIMIBE 10 MG PO TABS: 10.0000 mg | ORAL_TABLET | Freq: Every day | ORAL | 1 refills | Status: DC

## 2022-12-20 NOTE — Telephone Encounter (Signed)
Pt called back to discuss labs, Pt was advised  Of results, Pt stated understand and ok with [place] send in the Zetia 10 mg. Medication sent

## 2022-12-26 DIAGNOSIS — M1712 Unilateral primary osteoarthritis, left knee: Secondary | ICD-10-CM | POA: Diagnosis not present

## 2022-12-27 ENCOUNTER — Other Ambulatory Visit: Payer: Self-pay | Admitting: Internal Medicine

## 2023-01-23 ENCOUNTER — Ambulatory Visit (INDEPENDENT_AMBULATORY_CARE_PROVIDER_SITE_OTHER): Payer: Medicare HMO | Admitting: *Deleted

## 2023-01-23 DIAGNOSIS — Z0000 Encounter for general adult medical examination without abnormal findings: Secondary | ICD-10-CM | POA: Diagnosis not present

## 2023-01-23 MED ORDER — ATORVASTATIN CALCIUM 80 MG PO TABS: 80.0000 mg | ORAL_TABLET | Freq: Every day | ORAL | 1 refills | Status: DC

## 2023-01-23 NOTE — Patient Instructions (Signed)
Wanda [name] , Thank you for taking time to come for your Medicare Wellness Visit. I appreciate your ongoing commitment to your health goals. Please review the following plan we discussed and let me know if I can assist you in the future.     This is a list of the screening recommended for you and due dates:  Health Maintenance  Topic Date Due   Zoster (Shingles) Vaccine (1 of 2) Never done   COVID-19 Vaccine (6 - 2023-24 season) 03/07/2023*   Flu Shot  02/22/2023   Mammogram  08/25/2023   Medicare Annual Wellness Visit  01/23/2024   DTaP/Tdap/Td vaccine (3 - Td or Tdap) 12/25/2027   Pneumonia Vaccine  Completed   DEXA scan (bone density measurement)  Completed   Hepatitis C Screening  Completed   HPV Vaccine  Aged Out   Colon Cancer Screening  Discontinued  *Topic was postponed. The date shown is not the original due date.    Next appointment: Follow up in one year for your annual wellness visit.   Preventive Care 75 Years and Older, Female Preventive care refers to lifestyle choices and visits with your health care provider that can promote health and wellness. What does preventive care include? A yearly physical exam. This is also called an annual well check. Dental exams once or twice a year. Routine eye exams. Ask your health care provider how often you should have your eyes checked. Personal lifestyle choices, including: Daily care of your teeth and gums. Regular physical activity. Eating a healthy diet. Avoiding tobacco and drug use. Limiting alcohol use. Practicing safe sex. Taking low-dose aspirin every day. Taking vitamin and mineral supplements as recommended by your health care provider. What happens during an annual well check? The services and screenings done by your health care provider during your annual well check will depend on your age, overall health, lifestyle risk factors, and family history of disease. Counseling  Your health care provider may ask you  questions about your: Alcohol use. Tobacco use. Drug use. Emotional well-being. Home and relationship well-being. Sexual activity. Eating habits. History of falls. Memory and ability to understand (cognition). Work and work Astronomer. Reproductive health. Screening  You may have the following tests or measurements: Height, weight, and BMI. Blood pressure. Lipid and cholesterol levels. These may be checked every 5 years, or more frequently if you are over 60 years old. Skin check. Lung cancer screening. You may have this screening every year starting at age 61 if you have a 30-pack-year history of smoking and currently smoke or have quit within the past 15 years. Fecal occult blood test (FOBT) of the stool. You may have this test every year starting at age 25. Flexible sigmoidoscopy or colonoscopy. You may have a sigmoidoscopy every 5 years or a colonoscopy every 10 years starting at age 50. Hepatitis C blood test. Hepatitis B blood test. Sexually transmitted disease (STD) testing. Diabetes screening. This is done by checking your blood sugar (glucose) after you have not eaten for a while (fasting). You may have this done every 1-3 years. Bone density scan. This is done to screen for osteoporosis. You may have this done starting at age 15. Mammogram. This may be done every 1-2 years. Talk to your health care provider about how often you should have regular mammograms. Talk with your health care provider about your test results, treatment options, and if necessary, the need for more tests. Vaccines  Your health care provider may recommend certain vaccines, such  as: Influenza vaccine. This is recommended every year. Tetanus, diphtheria, and acellular pertussis (Tdap, Td) vaccine. You may need a Td booster every 10 years. Zoster vaccine. You may need this after age 7. Pneumococcal 13-valent conjugate (PCV13) vaccine. One dose is recommended after age 41. Pneumococcal polysaccharide  (PPSV23) vaccine. One dose is recommended after age 87. Talk to your health care provider about which screenings and vaccines you need and how often you need them. This information is not intended to replace advice given to you by your health care provider. Make sure you discuss any questions you have with your health care provider. Document Released: 08/06/2015 Document Revised: 03/29/2016 Document Reviewed: 05/11/2015 Elsevier Interactive Patient Education  2017 [employer] Prevention in the Home Falls can cause injuries. They can happen to people of all ages. There are many things you can do to make your home safe and to help prevent falls. What can I do on the outside of my home? Regularly fix the edges of walkways and driveways and fix any cracks. Remove anything that might make you trip as you walk through a door, such as a raised step or threshold. Trim any bushes or trees on the path to your home. Use bright outdoor lighting. Clear any walking paths of anything that might make someone trip, such as rocks or tools. Regularly check to see if handrails are loose or broken. Make sure that both sides of any steps have handrails. Any raised decks and porches should have guardrails on the edges. Have any leaves, snow, or ice cleared regularly. Use sand or salt on walking paths during winter. Clean up any spills in your garage right away. This includes oil or grease spills. What can I do in the bathroom? Use night lights. Install grab bars by the toilet and in the tub and shower. Do not use towel bars as grab bars. Use non-skid mats or decals in the tub or shower. If you need to sit down in the shower, use a plastic, non-slip stool. Keep the floor dry. Clean up any water that spills on the floor as soon as it happens. Remove soap buildup in the tub or shower regularly. Attach bath mats securely with double-sided non-slip rug tape. Do not have throw rugs and other things on the  floor that can make you trip. What can I do in the bedroom? Use night lights. Make sure that you have a light by your bed that is easy to reach. Do not use any sheets or blankets that are too big for your bed. They should not hang down onto the floor. Have a firm chair that has side arms. You can use this for support while you get dressed. Do not have throw rugs and other things on the floor that can make you trip. What can I do in the kitchen? Clean up any spills right away. Avoid walking on wet floors. Keep items that you use a lot in easy-to-reach places. If you need to reach something above you, use a strong step stool that has a grab bar. Keep electrical cords out of the way. Do not use floor polish or wax that makes floors slippery. If you must use wax, use non-skid floor wax. Do not have throw rugs and other things on the floor that can make you trip. What can I do with my stairs? Do not leave any items on the stairs. Make sure that there are handrails on both sides of the stairs and use  them. Fix handrails that are broken or loose. Make sure that handrails are as long as the stairways. Check any carpeting to make sure that it is firmly attached to the stairs. Fix any carpet that is loose or worn. Avoid having throw rugs at the top or bottom of the stairs. If you do have throw rugs, attach them to the floor with carpet tape. Make sure that you have a light switch at the top of the stairs and the bottom of the stairs. If you do not have them, ask someone to add them for you. What else can I do to help prevent falls? Wear shoes that: Do not have high heels. Have rubber bottoms. Are comfortable and fit you well. Are closed at the toe. Do not wear sandals. If you use a stepladder: Make sure that it is fully opened. Do not climb a closed stepladder. Make sure that both sides of the stepladder are locked into place. Ask someone to hold it for you, if possible. Clearly mark and make  sure that you can see: Any grab bars or handrails. First and last steps. Where the edge of each step is. Use tools that help you move around (mobility aids) if they are needed. These include: Canes. Walkers. Scooters. Crutches. Turn on the lights when you go into a dark area. Replace any light bulbs as soon as they burn out. Set up your furniture so you have a clear path. Avoid moving your furniture around. If any of your floors are uneven, fix them. If there are any pets around you, be aware of where they are. Review your medicines with your doctor. Some medicines can make you feel dizzy. This can increase your chance of falling. Ask your doctor what other things that you can do to help prevent falls. This information is not intended to replace advice given to you by your health care provider. Make sure you discuss any questions you have with your health care provider. Document Released: 05/06/2009 Document Revised: 12/16/2015 Document Reviewed: 08/14/2014 Elsevier Interactive Patient Education  2017 [employer].

## 2023-01-23 NOTE — Progress Notes (Signed)
Subjective:   Wanda [name] is a 77 y.o. female who presents for Medicare Annual (Subsequent) preventive examination.  Visit Complete: Virtual  I connected with  Shanon Payor Hollinsworth on 01/23/23 by a audio enabled telemedicine application and verified that I am speaking with the correct person using two identifiers.  Patient Location: Home  Provider Location: Office/Clinic  I discussed the limitations of evaluation and management by telemedicine. The patient expressed understanding and agreed to proceed.   Review of Systems     Cardiac Risk Factors include: advanced age (>68men, >34 women);dyslipidemia;hypertension;obesity (BMI >30kg/m2)     Objective:    Today's Vitals   There is no height or weight on file to calculate BMI.     01/23/2023    1:10 PM 05/11/2022   11:00 AM 05/01/2022   12:41 PM 02/06/2022    2:48 PM 01/17/2022    3:50 PM 03/16/2021    3:38 PM 03/11/2021    2:07 PM  Advanced Directives  Does Patient Have a Medical Advance Directive? No No No No No No No  Would patient like information on creating a medical advance directive? No - Patient declined No - Patient declined   No - Patient declined  No - Patient declined    Current Medications (verified) Outpatient Encounter Medications as of 01/23/2023  Medication Sig   benzonatate (TESSALON) 200 MG capsule Take 1 capsule (200 mg total) by mouth 3 (three) times daily as needed for cough.   calcium carbonate (CALCIUM 600) 600 MG TABS tablet Take 1 tablet (600 mg total) by mouth 2 (two) times daily with a meal.   Cholecalciferol (VITAMIN D) 50 MCG (2000 UT) tablet Take 2,000 Units by mouth daily.   cyclobenzaprine (FLEXERIL) 10 MG tablet Take 1 tablet (10 mg total) by mouth at bedtime as needed for muscle spasms.   diclofenac Sodium (VOLTAREN) 1 % GEL Apply 2 g topically 4 (four) times daily.   ezetimibe (ZETIA) 10 MG tablet Take 1 tablet (10 mg total) by mouth daily.   gabapentin (NEURONTIN) 300 MG capsule Take 1  capsule (300 mg total) by mouth 3 (three) times daily.   hydrochlorothiazide (HYDRODIURIL) 25 MG tablet TAKE 1 TABLET(25 MG) BY MOUTH DAILY   HYDROcodone-acetaminophen (NORCO/VICODIN) 5-325 MG tablet Take 1 tablet by mouth 3 (three) times daily as needed for severe pain. Must last 30 days   lisinopril (ZESTRIL) 10 MG tablet TAKE 1 TABLET(10 MG) BY MOUTH DAILY   naloxone (NARCAN) nasal spray 4 mg/0.1 mL Place 1 spray into the nose as needed for up to 365 doses (for opioid-induced respiratory depresssion). In case of emergency (overdose), spray once into each nostril. If no response within 3 minutes, repeat application and call 911.   sertraline (ZOLOFT) 100 MG tablet Take 2 tablets (200 mg total) by mouth daily.   [DISCONTINUED] atorvastatin (LIPITOR) 80 MG tablet Take 1 tablet (80 mg total) by mouth at bedtime.   atorvastatin (LIPITOR) 80 MG tablet Take 1 tablet (80 mg total) by mouth at bedtime.   No facility-administered encounter medications on file as of 01/23/2023.    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    Past Surgical History:  Procedure Laterality Date   G3 P3      Family History  Problem Relation Age of Onset   Cancer Sister        ??CERVICAL   Cancer Brother        STOMACH   Breast cancer Sister        sister dx age 32, aunt, cousin x 2    Colon cancer Brother        dx age ~ 58 ?   Diabetes Other        aunt    Kidney cancer Son    Breast cancer Maternal Aunt    Coronary artery disease Neg Hx    CAD Neg Hx    Social History   Socioeconomic History   Marital status: Married    Spouse name: Not on file   Number of children: 3    Years of education: Not on file   Highest education level: Not on file  Occupational History   Occupation: ON DISABILITY  Tobacco Use   Smoking status: Never   Smokeless tobacco: Never  Vaping Use   Vaping Use: Never used  Substance and Sexual Activity   Alcohol use: No   Drug use: No   Sexual activity: Not Currently  Other Topics Concern   Not on file  Social History Narrative   Married, 3 children (2 sons, a daughter), 6 G-kids , 5 g-g-children   Household:  pt, husband w/ dementia Morrissette is the main caregiver), daughter live w/ them        Social Determinants of Health   Financial Resource Strain: Low Risk  (01/17/2022)   Overall Financial Resource Strain (CARDIA)    Difficulty of Paying Living Expenses: Not hard at all  Food Insecurity: No Food Insecurity (01/23/2023)   Hunger Vital Sign    Worried About Running Out of Food in the Last Year: Never true    Ran Out of Food in the Last Year: Never true  Transportation Needs: No Transportation Needs (01/23/2023)   PRAPARE - Administrator, Civil Service (Medical): No    Lack of Transportation (Non-Medical): No  Physical Activity: Inactive (01/23/2023)   Exercise Vital Sign    Days of Exercise per Week: 0 days    Minutes of Exercise per Session: 0 min  Stress: No Stress Concern Present (01/17/2022)   [employer] of Occupational Health - Occupational Stress Questionnaire    Feeling of Stress : Not at all  Social Connections: Moderately Integrated (01/17/2022)   Social Connection and Isolation Panel [NHANES]    Frequency of Communication with Friends and Family: Twice a week    Frequency of Social Gatherings with Friends and Family: More than three times a week    Attends Religious Services: More than 4 times per year    Active Member of [employer] or Organizations: No    Attends Engineer, structural: Never    Marital Status: Married    Tobacco Counseling Counseling given: Not Answered   Clinical  Intake:  Pre-visit preparation completed: Yes  Pain : No/denies pain  Nutritional Risks: None Diabetes: No  How often do you need to have someone help you when you read instructions, pamphlets, or other written materials from your doctor or pharmacy?: 1 - Never  Interpreter Needed?: No  Information entered by :: [employer], CMA  Activities of Daily Living    01/23/2023    1:07 PM  In your present state of health, do you have any difficulty performing the following activities:  Hearing? 0  Vision? 0  Difficulty concentrating or making decisions? 0  Walking or climbing stairs? 1  Dressing or bathing? 0  Doing errands, shopping? 0  Preparing Food and eating ? N  Using the Toilet? N  In the past six months, have you accidently leaked urine? N  Do you have problems with loss of bowel control? N  Managing your Medications? N  Managing your Finances? N  Housekeeping or managing your Housekeeping? N    Patient Care Team: Wanda Plump, MD as PCP - General Delano Metz, MD as Referring Physician (Pain Medicine) Rachael Fee, MD as Attending Physician (Gastroenterology)  Indicate any recent Medical Services you may have received from other than [facility] providers in the past year (date may be approximate).     Assessment:   This is a routine wellness examination for [place].  Hearing/Vision screen No results found.  Dietary issues and exercise activities discussed:     Goals Addressed   None    Depression Screen    01/23/2023    1:16 PM 12/13/2022   11:13 AM 09/06/2022    9:57 AM 05/11/2022   11:00 AM 05/01/2022   12:41 PM 03/06/2022    9:28 AM 02/06/2022    2:48 PM  PHQ 2/9 Scores  PHQ - 2 Score 2 4 3 1  0 0 0  PHQ- 9 Score  13 15   9      Fall Risk    01/23/2023    1:09 PM 12/13/2022   10:50 AM 09/06/2022    9:32 AM 05/11/2022   10:59 AM 05/01/2022   12:41 PM  Fall Risk   Falls in the past year? 0 0 0 1 1  Number falls in past yr: 0 0 0 0 0  Injury with  Fall? 0 0 0 0 0  Risk for fall due to : No Fall Risks   History of fall(s)   Follow up Falls evaluation completed Falls evaluation completed Falls evaluation completed      MEDICARE RISK AT HOME:  Medicare Risk at Home - 01/23/23 1309     Any stairs in or around the home? Yes    If so, are there any without handrails? Yes    Home free of loose throw rugs in walkways, pet beds, electrical cords, etc? Yes    Adequate lighting in your home to reduce risk of falls? Yes    Life alert? No    Use of a cane, walker or w/c? Yes   cane   Grab bars in the bathroom? Yes    Shower chair or bench in shower? Yes    Elevated toilet seat or a handicapped toilet? Yes             TIMED UP AND GO:  Was the test performed?  No    Cognitive Function:    08/30/2016    9:07 AM  MMSE - Mini Mental State Exam  Orientation to time 5  Orientation to Place 5  Registration 3  Attention/ Calculation 5  Recall 2  Language- name 2 objects 2  Language- repeat 1  Language- follow 3 step command 3  Language- read & follow direction 1  Write a sentence 1  Copy design 1  Total score 29  01/23/2023    1:20 PM 01/17/2022    3:58 PM  6CIT Screen  What Year? 0 points 0 points  What month? 0 points 0 points  What time? 0 points 0 points  Count back from 20 0 points 0 points  Months in reverse 0 points 0 points  Repeat phrase 2 points 2 points  Total Score 2 points 2 points    Immunizations Immunization History  Administered Date(s) Administered   Fluad Quad(high Dose 65+) 03/26/2019   Influenza Split 05/01/2011, 03/25/2014, 04/16/2018   Influenza Whole 04/17/2008, 05/05/2009, 04/11/2010   Influenza, High Dose Seasonal PF 05/07/2013, 06/08/2021   Influenza-Unspecified 04/01/2015, 05/01/2016, 05/21/2017, 05/24/2022   PFIZER(Purple Top)SARS-COV-2 Vaccination 08/14/2019, 09/04/2019, 04/21/2020   PNEUMOCOCCAL CONJUGATE-20 03/06/2022, 09/06/2022   Pfizer Covid-19 Vaccine Bivalent Booster 41yrs  & up 06/08/2021, 05/29/2022   Pneumococcal Conjugate-13 04/16/2014   Pneumococcal Polysaccharide-23 04/01/2015   Td 04/17/2008   Tdap 12/24/2017   Zoster, Live 05/20/2013    TDAP status: Up to date  Flu Vaccine status: Up to date  Pneumococcal vaccine status: Up to date  Covid-19 vaccine status: Information provided on how to obtain vaccines.   Qualifies for Shingles Vaccine? Yes   Zostavax completed Yes   Shingrix Completed?: No.    Education has been provided regarding the importance of this vaccine. Patient has been advised to call insurance company to determine out of pocket expense if they have not yet received this vaccine. Advised may also receive vaccine at local pharmacy or Health Dept. Verbalized acceptance and understanding.  Screening Tests Health Maintenance  Topic Date Due   Zoster Vaccines- Shingrix (1 of 2) Never done   Medicare Annual Wellness (AWV)  01/18/2023   COVID-19 Vaccine (6 - 2023-24 season) 03/07/2023 (Originally 07/24/2022)   INFLUENZA VACCINE  02/22/2023   MAMMOGRAM  08/25/2023   DTaP/Tdap/Td (3 - Td or Tdap) 12/25/2027   Pneumonia Vaccine 84+ Years old  Completed   DEXA SCAN  Completed   Hepatitis C Screening  Completed   HPV VACCINES  Aged Out   Colonoscopy  Discontinued    Health Maintenance  Health Maintenance Due  Topic Date Due   Zoster Vaccines- Shingrix (1 of 2) Never done   Medicare Annual Wellness (AWV)  01/18/2023    Colorectal cancer screening: No longer required.   Mammogram status: Completed 08/24/22. Repeat every year  Bone Density status: Ordered 03/06/22. Pt provided with contact info and advised to call to schedule appt.  Lung Cancer Screening: (Low Dose CT Chest recommended if Age 16-80 years, 20 pack-year currently smoking OR have quit w/in 15years.) does not qualify.   Additional Screening:  Hepatitis C Screening: does qualify; Completed 08/30/16  Vision Screening: Recommended annual ophthalmology exams for early  detection of glaucoma and other disorders of the eye. Is the patient up to date with their annual eye exam?  No  Who is the provider or what is the name of the office in which the patient attends annual eye exams? Dr. London Sheer If pt is not established with a provider, would they like to be referred to a provider to establish care? No .   Dental Screening: Recommended annual dental exams for proper oral hygiene  Diabetic Foot Exam: N/a  Community Resource Referral / Chronic Care Management: CRR required this visit?  No   CCM required this visit?  No     Plan:     I have personally reviewed and noted the following in the patient's chart:  Medical and social history Use of alcohol, tobacco or illicit drugs  Current medications and supplements including opioid prescriptions. Patient is currently taking opioid prescriptions. Information provided to patient regarding non-opioid alternatives. Patient advised to discuss non-opioid treatment plan with their provider. Functional ability and status Nutritional status Physical activity Advanced directives List of other physicians Hospitalizations, surgeries, and ER visits in previous 12 months Vitals Screenings to include cognitive, depression, and falls Referrals and appointments  In addition, I have reviewed and discussed with patient certain preventive protocols, quality metrics, and best practice recommendations. A written personalized care plan for preventive services as well as general preventive health recommendations were provided to patient.     Donne Anon, CMA   01/23/2023   After Visit Summary: (Declined) Due to this being a telephonic visit, with patients personalized plan was offered to patient but patient Declined AVS at this time   Nurse Notes: None

## 2023-01-31 DIAGNOSIS — M1712 Unilateral primary osteoarthritis, left knee: Secondary | ICD-10-CM | POA: Diagnosis not present

## 2023-01-31 DIAGNOSIS — M25562 Pain in left knee: Secondary | ICD-10-CM | POA: Diagnosis not present

## 2023-01-31 DIAGNOSIS — M25561 Pain in right knee: Secondary | ICD-10-CM | POA: Diagnosis not present

## 2023-01-31 DIAGNOSIS — M1711 Unilateral primary osteoarthritis, right knee: Secondary | ICD-10-CM | POA: Diagnosis not present

## 2023-02-07 DIAGNOSIS — M1712 Unilateral primary osteoarthritis, left knee: Secondary | ICD-10-CM | POA: Diagnosis not present

## 2023-02-14 DIAGNOSIS — M25562 Pain in left knee: Secondary | ICD-10-CM | POA: Diagnosis not present

## 2023-02-14 DIAGNOSIS — M1712 Unilateral primary osteoarthritis, left knee: Secondary | ICD-10-CM | POA: Diagnosis not present

## 2023-02-15 ENCOUNTER — Other Ambulatory Visit: Payer: Self-pay | Admitting: Internal Medicine

## 2023-02-27 ENCOUNTER — Other Ambulatory Visit: Payer: Self-pay | Admitting: Internal Medicine

## 2023-04-05 ENCOUNTER — Other Ambulatory Visit: Payer: Self-pay | Admitting: Internal Medicine

## 2023-04-06 ENCOUNTER — Other Ambulatory Visit: Payer: Self-pay | Admitting: Internal Medicine

## 2023-04-17 ENCOUNTER — Encounter: Payer: Medicare HMO | Admitting: Internal Medicine

## 2023-04-20 ENCOUNTER — Other Ambulatory Visit: Payer: Self-pay | Admitting: Internal Medicine

## 2023-04-20 DIAGNOSIS — M792 Neuralgia and neuritis, unspecified: Secondary | ICD-10-CM

## 2023-05-26 ENCOUNTER — Other Ambulatory Visit: Payer: Self-pay | Admitting: Internal Medicine

## 2023-05-26 DIAGNOSIS — M792 Neuralgia and neuritis, unspecified: Secondary | ICD-10-CM

## 2023-06-30 ENCOUNTER — Other Ambulatory Visit: Payer: Self-pay | Admitting: Internal Medicine

## 2023-07-24 DIAGNOSIS — H5203 Hypermetropia, bilateral: Secondary | ICD-10-CM | POA: Diagnosis not present

## 2023-08-14 ENCOUNTER — Other Ambulatory Visit: Payer: Self-pay | Admitting: Internal Medicine

## 2023-08-14 ENCOUNTER — Encounter: Payer: Self-pay | Admitting: Internal Medicine

## 2023-08-14 ENCOUNTER — Ambulatory Visit (INDEPENDENT_AMBULATORY_CARE_PROVIDER_SITE_OTHER): Payer: Medicare HMO | Admitting: Internal Medicine

## 2023-08-14 ENCOUNTER — Other Ambulatory Visit (HOSPITAL_BASED_OUTPATIENT_CLINIC_OR_DEPARTMENT_OTHER): Payer: Self-pay

## 2023-08-14 VITALS — BP 126/74 | HR 76 | Temp 98.2°F | Resp 16 | Ht 64.0 in | Wt 237.4 lb

## 2023-08-14 DIAGNOSIS — F32A Depression, unspecified: Secondary | ICD-10-CM

## 2023-08-14 DIAGNOSIS — I10 Essential (primary) hypertension: Secondary | ICD-10-CM

## 2023-08-14 DIAGNOSIS — F419 Anxiety disorder, unspecified: Secondary | ICD-10-CM

## 2023-08-14 DIAGNOSIS — G894 Chronic pain syndrome: Secondary | ICD-10-CM

## 2023-08-14 DIAGNOSIS — Z0001 Encounter for general adult medical examination with abnormal findings: Secondary | ICD-10-CM

## 2023-08-14 DIAGNOSIS — E785 Hyperlipidemia, unspecified: Secondary | ICD-10-CM | POA: Diagnosis not present

## 2023-08-14 DIAGNOSIS — Z0000 Encounter for general adult medical examination without abnormal findings: Secondary | ICD-10-CM | POA: Diagnosis not present

## 2023-08-14 DIAGNOSIS — E559 Vitamin D deficiency, unspecified: Secondary | ICD-10-CM | POA: Diagnosis not present

## 2023-08-14 DIAGNOSIS — Z1231 Encounter for screening mammogram for malignant neoplasm of breast: Secondary | ICD-10-CM

## 2023-08-14 DIAGNOSIS — Z1211 Encounter for screening for malignant neoplasm of colon: Secondary | ICD-10-CM

## 2023-08-14 DIAGNOSIS — M792 Neuralgia and neuritis, unspecified: Secondary | ICD-10-CM

## 2023-08-14 LAB — CBC WITH DIFFERENTIAL/PLATELET
Basophils Absolute: 0 10*3/uL (ref 0.0–0.1)
Basophils Relative: 0.9 % (ref 0.0–3.0)
Eosinophils Absolute: 0.1 10*3/uL (ref 0.0–0.7)
Eosinophils Relative: 1.9 % (ref 0.0–5.0)
HCT: 40.9 % (ref 36.0–46.0)
Hemoglobin: 13.4 g/dL (ref 12.0–15.0)
Lymphocytes Relative: 43.4 % (ref 12.0–46.0)
Lymphs Abs: 2 10*3/uL (ref 0.7–4.0)
MCHC: 32.8 g/dL (ref 30.0–36.0)
MCV: 96.8 fL (ref 78.0–100.0)
Monocytes Absolute: 0.4 10*3/uL (ref 0.1–1.0)
Monocytes Relative: 7.8 % (ref 3.0–12.0)
Neutro Abs: 2.1 10*3/uL (ref 1.4–7.7)
Neutrophils Relative %: 46 % (ref 43.0–77.0)
Platelets: 169 10*3/uL (ref 150.0–400.0)
RBC: 4.23 Mil/uL (ref 3.87–5.11)
RDW: 15.4 % (ref 11.5–15.5)
WBC: 4.6 10*3/uL (ref 4.0–10.5)

## 2023-08-14 LAB — LIPID PANEL
Cholesterol: 170 mg/dL (ref 0–200)
HDL: 59.9 mg/dL (ref >39.00)
LDL Cholesterol: 97 mg/dL (ref 0–99)
NonHDL: 109.7
Total CHOL/HDL Ratio: 3
Triglycerides: 64 mg/dL (ref 0.0–149.0)
VLDL: 12.8 mg/dL (ref 0.0–40.0)

## 2023-08-14 LAB — BASIC METABOLIC PANEL
BUN: 8 mg/dL (ref 6–23)
CO2: 29 meq/L (ref 19–32)
Calcium: 9.5 mg/dL (ref 8.4–10.5)
Chloride: 102 meq/L (ref 96–112)
Creatinine, Ser: 0.59 mg/dL (ref 0.40–1.20)
GFR: 88.33 mL/min (ref >60.00)
Glucose, Bld: 86 mg/dL (ref 70–99)
Potassium: 4 meq/L (ref 3.5–5.1)
Sodium: 138 meq/L (ref 135–145)

## 2023-08-14 LAB — VITAMIN D 25 HYDROXY (VIT D DEFICIENCY, FRACTURES): VITD: 26.48 ng/mL — ABNORMAL LOW (ref 30.00–100.00)

## 2023-08-14 LAB — AST: AST: 19 U/L (ref 0–37)

## 2023-08-14 LAB — ALT: ALT: 17 U/L (ref 0–35)

## 2023-08-14 MED ORDER — GABAPENTIN 300 MG PO CAPS: 300.0000 mg | ORAL_CAPSULE | Freq: Three times a day (TID) | ORAL | 1 refills | Status: AC

## 2023-08-14 MED ORDER — LISINOPRIL 10 MG PO TABS: 10.0000 mg | ORAL_TABLET | Freq: Every day | ORAL | 1 refills | Status: DC

## 2023-08-14 MED ORDER — SHINGRIX 50 MCG/0.5ML IM SUSR
0.5000 mL | Freq: Once | INTRAMUSCULAR | 1 refills | Status: AC
  Filled 2023-08-14: qty 0.5, 1d supply, fill #0

## 2023-08-14 NOTE — Telephone Encounter (Signed)
Copied from CRM [id number]. Topic: Clinical - Medication Refill >> Aug 14, 2023  3:25 PM Florestine Avers wrote: Most Recent Primary Care Visit:  Provider: Juel Burrow  Department: [facility]  Visit Type: MEDICARE AWV, SEQUENTIAL  Date: 01/23/2023  Medication: gabapentin (NEURONTIN) 300 MG capsule, lisinopril (ZESTRIL) 10 MG tablet   Has the patient contacted their pharmacy? Yes (Agent: If no, request that the patient contact the pharmacy for the refill. If patient does not wish to contact the pharmacy document the reason why and proceed with request.) (Agent: If yes, when and what did the pharmacy advise?)  Is this the correct pharmacy for this prescription? Yes If no, delete pharmacy and type the correct one.  This is the patient's preferred pharmacy:  [facility] [address] [place] - [address] [place] [address] Phone: [phone] Fax: [phone]   Has the prescription been filled recently? Yes  Is the patient out of the medication? Yes  Has the patient been seen for an appointment in the last year OR does the patient have an upcoming appointment? Yes  Can we respond through MyChart? No  Agent: Please be advised that Rx refills may take up to 3 business days. We ask that you follow-up with your pharmacy.

## 2023-08-14 NOTE — Patient Instructions (Addendum)
Vaccines I recommend: Shingrix (shingles) RSV We are referring you to the gastroenterologist for consideration of a colonoscopy. Call them at [phone].  Check the  blood pressure regularly Blood pressure goal:  between 110/65 and  135/85. If it is consistently higher or lower, let me know     GO TO THE LAB : Get the blood work     Next visit with me in 4 to 6 months. Please schedule it at the front desk       "Health Care Power of attorney" ,  "Living will" (Advance care planning documents)  If you already have a living will or healthcare power of attorney, is recommended you bring the copy to be scanned in your chart.   The document will be available to all the doctors you see in the system.  Advance care planning is a process that supports adults in  understanding and sharing their preferences regarding future medical care.  The patient's preferences are recorded in documents called Advance Directives and the can be modified at any time while the patient is in full mental capacity.   If you don't have one, please consider create one.      More information at: [email or web address]

## 2023-08-14 NOTE — Progress Notes (Unsigned)
Subjective:    Patient ID: Wanda [name], female    DOB: [birth year], 78 y.o.   MRN: [id number]  DOS:  08/14/2023 Type of visit - description: cpx  For CPX Reports no chest pain or difficulty breathing. Good compliance with meds. Was discharged from pain management, working on getting another pain mgmt provider.     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     Past Surgical History:  Procedure Laterality Date   G3 P3      Social History   Socioeconomic History   Marital status: Married    Spouse name: Not on file   Number of children: 3   Years of education: Not on file   Highest education level: Not on file  Occupational History   Occupation: ON DISABILITY  Tobacco Use   Smoking status: Never   Smokeless tobacco: Never  Vaping Use   Vaping status: Never Used  Substance and Sexual Activity   Alcohol use: No   Drug use: No   Sexual activity: Not Currently  Other Topics Concern   Not on file  Social History Narrative   Married, 3 children (2 sons, a daughter), 6 G-kids , 5 g-g-children   Household:  pt, husband w/ dementia Hilt is the main caregiver), daughter live w/ them        Social Drivers of Corporate investment banker Strain: Low Risk  (01/17/2022)   Overall Financial Resource Strain (CARDIA)    Difficulty of Paying Living Expenses: Not hard at all  Food Insecurity: No Food Insecurity (01/23/2023)   Hunger Vital Sign    Worried About Running Out of Food in the Last Year: Never true    Ran Out of Food in the Last Year: Never true  Transportation Needs: No Transportation Needs  (01/23/2023)   PRAPARE - Administrator, Civil Service (Medical): No    Lack of Transportation (Non-Medical): No  Physical Activity: Inactive (01/23/2023)   Exercise Vital Sign    Days of Exercise per Week: 0 days    Minutes of Exercise per Session: 0 min  Stress: No Stress Concern Present (01/17/2022)   [employer] of Occupational Health - Occupational Stress Questionnaire    Feeling of Stress : Not at all  Social Connections: Moderately Integrated (01/17/2022)   Social Connection and Isolation Panel [NHANES]    Frequency of Communication with Friends and Family: Twice a week    Frequency of Social Gatherings with Friends and Family: More than three times a week    Attends Religious Services: More than 4 times per year    Active Member of [employer] or Organizations: No    Attends Banker Meetings: Never    Marital Status: Married  Catering manager Violence: Not At Risk (01/23/2023)   Humiliation, Afraid, Rape, and Kick questionnaire    Fear of Current or Ex-Partner: No    Emotionally Abused: No    Physically Abused: No  Sexually Abused: No    Current Outpatient Medications  Medication Instructions   atorvastatin (LIPITOR) 80 mg, Oral, Daily at bedtime   calcium carbonate (CALCIUM 600) 600 mg, Oral, 2 times daily with meals   cyclobenzaprine (FLEXERIL) 10 mg, Oral, At bedtime PRN   ezetimibe (ZETIA) 10 mg, Oral, Daily   gabapentin (NEURONTIN) 300 mg, Oral, 3 times daily   hydrochlorothiazide (HYDRODIURIL) 25 MG tablet TAKE 1 TABLET(25 MG) BY MOUTH DAILY   lisinopril (ZESTRIL) 10 mg, Oral, Daily   sertraline (ZOLOFT) 200 mg, Oral, Daily   Vitamin D 2,000 Units, Daily   Zoster Vaccine Adjuvanted (SHINGRIX) injection 0.5 mLs, Intramuscular,  Once       Objective:   Physical Exam BP 126/74   Pulse 76   Temp 98.2 F (36.8 C) (Oral)   Resp 16   Ht 5\' 4"  (1.626 m)   Wt 237 lb 6 oz (107.7 kg)   SpO2 97%   BMI 40.75 kg/m  General: Well developed, NAD,  BMI noted Neck: No  thyromegaly  HEENT:  Normocephalic . Face symmetric, atraumatic Lungs:  CTA B Normal respiratory effort, no intercostal retractions, no accessory muscle use. Heart: RRR,  no murmur.  Abdomen:  Not distended, soft, non-tender. No rebound or rigidity.   Lower extremities: no pretibial edema bilaterally  Skin: Exposed areas without rash. Not pale. Not jaundice Neurologic:  alert & oriented X3.  Speech normal, gait and transferring limited by BMI and DJD. Strength symmetric and appropriate for age.  Psych: Cognition and judgment appear intact.  Cooperative with normal attention span and concentration.  Behavior appropriate. No anxious or depressed appearing.     Assessment    Assessment HTN Hyperlipidemia Depression Morbid obesity MSK: DJD, chronic back-knee pain, hydrocodone /gabapentin rx by DR Fabian November D  Def  PLAN:  Here for CPX - Td 2019 - PNM 23 2016; PNM 13 2015; PNM 20: 2023 - zostavax: 2014 - Had COVID and flu shot. - Vaccines I recommend: Shingrix, RSV. -Female care:  Last PAP 2017. MMG 11/2022 per [facility], referral sent. -CCS: cscope 2014, was referred last year, GI unable to communicate with the patient.  Brother had colon cancer at age 91. Referral re send, advise pt to call (understanding she may not qualify d/t age and co morbidities) -DEXA  2012: Normal (and  improved compared to 2009 per report).  DEXA referral failed x2. -Labs: BMP AST ALT FLP CBC vitamin D -Diet: Discussed - POA discussed Also, chronic medical problems as HTN: Normal Latoria BPs, BP looks good today, continue HCTZ, lisinopril.  Checking labs. Hyperlipidemia: On Lipitor 80, last LDL 160, I recommended zetia which she is taking.  Labs. Depression anxiety: On Zoloft, symptoms are relatively well-controlled, unfortunately she has a number of challenges including taking care of her husband who has dementia. MSK: Currently has no pain management doctor, was let go from  Dr. Laban Emperor office.  She is trying to get established with a new specialist. Vitamin D deficiency: On OTCs, checking labs RTC 4 to 6 months

## 2023-08-15 ENCOUNTER — Encounter: Payer: Self-pay | Admitting: Internal Medicine

## 2023-08-15 NOTE — Assessment & Plan Note (Signed)
Here for CPX - Td 2019 - PNM 23 2016; PNM 13 2015; PNM 20: 2023 - zostavax: 2014 - Had COVID and flu shot. - Vaccines I recommend: Shingrix, RSV. -Female care:  Last PAP 2017. MMG 11/2022 per [facility], referral sent. -CCS: cscope 2014, was referred last year, GI unable to communicate with the patient.  Brother had colon cancer at age 76. Referral re send, advise pt to call (understanding she may not qualify d/t age and co morbidities) -DEXA  2012: Normal (and  improved compared to 2009 per report).  DEXA referral failed x2. -Labs: BMP AST ALT FLP CBC vitamin D -Diet: Discussed - POA discussed

## 2023-08-15 NOTE — Assessment & Plan Note (Signed)
Here for CPX Also, chronic medical problems as HTN: Normal Latoria BPs, BP looks good today, continue HCTZ, lisinopril.  Checking labs. Hyperlipidemia: On Lipitor 80, last LDL 160, I recommended zetia which she is taking.  Labs. Depression anxiety: On Zoloft, symptoms are relatively well-controlled, unfortunately she has a number of challenges including taking care of her husband who has dementia. MSK: Currently has no pain management doctor, was let go from Dr. Laban Emperor office.  She is trying to get established with a new specialist. Vitamin D deficiency: On OTCs, checking labs RTC 4 to 6 months

## 2023-08-16 ENCOUNTER — Encounter: Payer: Self-pay | Admitting: Internal Medicine

## 2023-08-16 MED ORDER — VITAMIN D (ERGOCALCIFEROL) 1.25 MG (50000 UNIT) PO CAPS: 50000.0000 [IU] | ORAL_CAPSULE | ORAL | 0 refills | Status: AC

## 2023-08-16 NOTE — Addendum Note (Signed)
Addended byConrad Epping D on: 08/16/2023 12:39 PM   Modules accepted: Orders

## 2023-08-29 ENCOUNTER — Telehealth (HOSPITAL_BASED_OUTPATIENT_CLINIC_OR_DEPARTMENT_OTHER): Payer: Self-pay

## 2023-08-30 ENCOUNTER — Inpatient Hospital Stay (HOSPITAL_BASED_OUTPATIENT_CLINIC_OR_DEPARTMENT_OTHER): Admission: RE | Admit: 2023-08-30 | Payer: Medicare HMO | Source: Ambulatory Visit

## 2023-08-31 ENCOUNTER — Other Ambulatory Visit: Payer: Self-pay

## 2023-08-31 DIAGNOSIS — Z780 Asymptomatic menopausal state: Secondary | ICD-10-CM

## 2023-09-18 ENCOUNTER — Telehealth (HOSPITAL_BASED_OUTPATIENT_CLINIC_OR_DEPARTMENT_OTHER): Payer: Self-pay

## 2023-09-27 DIAGNOSIS — M1712 Unilateral primary osteoarthritis, left knee: Secondary | ICD-10-CM | POA: Diagnosis not present

## 2023-10-15 ENCOUNTER — Other Ambulatory Visit: Payer: Self-pay | Admitting: Internal Medicine

## 2023-10-16 ENCOUNTER — Telehealth (HOSPITAL_BASED_OUTPATIENT_CLINIC_OR_DEPARTMENT_OTHER): Payer: Self-pay

## 2023-11-15 ENCOUNTER — Other Ambulatory Visit: Payer: Self-pay | Admitting: Internal Medicine

## 2023-11-21 ENCOUNTER — Telehealth (HOSPITAL_BASED_OUTPATIENT_CLINIC_OR_DEPARTMENT_OTHER): Payer: Self-pay

## 2023-11-28 ENCOUNTER — Other Ambulatory Visit: Payer: Self-pay | Admitting: Internal Medicine

## 2023-12-24 ENCOUNTER — Emergency Department (HOSPITAL_BASED_OUTPATIENT_CLINIC_OR_DEPARTMENT_OTHER): Admitting: Radiology

## 2023-12-24 ENCOUNTER — Encounter (HOSPITAL_BASED_OUTPATIENT_CLINIC_OR_DEPARTMENT_OTHER): Payer: Self-pay

## 2023-12-24 ENCOUNTER — Emergency Department (HOSPITAL_BASED_OUTPATIENT_CLINIC_OR_DEPARTMENT_OTHER)
Admission: EM | Admit: 2023-12-24 | Discharge: 2023-12-24 | Disposition: A | Discharge status: Home / Self Care | Attending: Emergency Medicine | Admitting: Emergency Medicine

## 2023-12-24 ENCOUNTER — Other Ambulatory Visit: Payer: Self-pay

## 2023-12-24 DIAGNOSIS — Z043 Encounter for examination and observation following other accident: Secondary | ICD-10-CM | POA: Diagnosis not present

## 2023-12-24 DIAGNOSIS — Z79899 Other long term (current) drug therapy: Secondary | ICD-10-CM | POA: Diagnosis not present

## 2023-12-24 DIAGNOSIS — I10 Essential (primary) hypertension: Secondary | ICD-10-CM | POA: Diagnosis not present

## 2023-12-24 DIAGNOSIS — M25561 Pain in right knee: Secondary | ICD-10-CM | POA: Diagnosis not present

## 2023-12-24 DIAGNOSIS — M1712 Unilateral primary osteoarthritis, left knee: Secondary | ICD-10-CM | POA: Diagnosis not present

## 2023-12-24 DIAGNOSIS — R0789 Other chest pain: Secondary | ICD-10-CM | POA: Insufficient documentation

## 2023-12-24 DIAGNOSIS — W19XXXA Unspecified fall, initial encounter: Secondary | ICD-10-CM | POA: Diagnosis not present

## 2023-12-24 DIAGNOSIS — R059 Cough, unspecified: Secondary | ICD-10-CM | POA: Diagnosis not present

## 2023-12-24 DIAGNOSIS — M25562 Pain in left knee: Secondary | ICD-10-CM | POA: Diagnosis not present

## 2023-12-24 NOTE — ED Notes (Signed)
 Patient transported to X-ray

## 2023-12-24 NOTE — ED Triage Notes (Signed)
 Pt fell a week ago and hurt her right side and the left knee. Said her right side hurts worse when she breathes.

## 2023-12-24 NOTE — Discharge Instructions (Addendum)
 No concerning findings on the chest x-ray or the left knee x-ray.  Follow-up with your primary care doctor later in the week for reevaluation.  You also received an incentive spirometer.  Use this to continue exercising your lungs to prevent pneumonia.  Return for any emergent symptoms.

## 2023-12-24 NOTE — ED Notes (Signed)
 Pt ambulated to bathroom with cane, no acute distress

## 2023-12-24 NOTE — ED Provider Notes (Signed)
 [place] [facility]PART[facility] AT [facility] Provider Note   CSN: [id number] Arrival date & time: 12/24/23  1133     History  Chief Complaint  Patient presents with   Wanda [name] is a 78 y.o. female.  78 year old female presents today for concern of a fall that occurred about 1-2 weeks ago.  She is accompanied by her daughter and great-grandson.  She endorses pain to her right chest wall.  She also endorses some pain to the left knee.  Endorses cough over the past few days.  She states she waited to come in due to her husband being critically sick.  He is currently admitted in the ICU.  The history is provided by the patient. No language interpreter was used.       Home Medications Prior to Admission medications   Medication Sig Start Date End Date Taking? Authorizing Provider  atorvastatin  (LIPITOR) 80 MG tablet Take 1 tablet (80 mg total) by mouth at bedtime. 10/15/23   Paz, Jose E, MD  calcium  carbonate (CALCIUM  600) 600 MG TABS tablet Take 1 tablet (600 mg total) by mouth 2 (two) times daily with a meal. 09/17/19   Renaldo Caroli, MD  Cholecalciferol (VITAMIN D ) 50 MCG (2000 UT) tablet Take 2,000 Units by mouth daily.    [provider]  cyclobenzaprine  (FLEXERIL ) 10 MG tablet Take 1 tablet (10 mg total) by mouth at bedtime as needed for muscle spasms. Patient not taking: Reported on 08/14/2023 12/13/22   Ezell Hollow, MD  ezetimibe  (ZETIA ) 10 MG tablet Take 1 tablet (10 mg total) by mouth daily. 07/02/23   Ezell Hollow, MD  gabapentin  (NEURONTIN ) 300 MG capsule Take 1 capsule (300 mg total) by mouth 3 (three) times daily. 08/14/23   Ezell Hollow, MD  hydrochlorothiazide  (HYDRODIURIL ) 25 MG tablet Take 1 tablet (25 mg total) by mouth daily. 11/28/23   Ezell Hollow, MD  lisinopril  (ZESTRIL ) 10 MG tablet Take 1 tablet (10 mg total) by mouth daily. 11/16/23   Ezell Hollow, MD  sertraline  (ZOLOFT ) 100 MG tablet Take 2 tablets (200 mg total) by mouth daily.  04/06/23   Ezell Hollow, MD      Allergies    Atorvastatin  and Simvastatin    Review of Systems   Review of Systems  Constitutional:  Negative for chills and fever.  Respiratory:  Positive for cough. Negative for shortness of breath.   Cardiovascular:  Positive for chest pain.  Gastrointestinal:  Negative for abdominal pain.  Neurological:  Negative for light-headedness.  All other systems reviewed and are negative.   Physical Exam Updated Vital Signs BP 117/71   Pulse 71   Temp 98.4 F (36.9 C) (Oral)   Resp 18   Ht 5\' 4"  (1.626 m)   Wt 107.5 kg   SpO2 100%   BMI 40.68 kg/m  Physical Exam Vitals and nursing note reviewed.  Constitutional:      General: She is not in acute distress.    Appearance: Normal appearance. She is not ill-appearing.  HENT:     Head: Normocephalic and atraumatic.     Nose: Nose normal.  Eyes:     Conjunctiva/sclera: Conjunctivae normal.  Cardiovascular:     Rate and Rhythm: Normal rate and regular rhythm.  Pulmonary:     Effort: Pulmonary effort is normal. No respiratory distress.     Breath sounds: Normal breath sounds. No wheezing.  Musculoskeletal:  General: No deformity. Normal range of motion.     Cervical back: Normal range of motion.  Skin:    Findings: No rash.  Neurological:     Mental Status: She is alert.    ED Results / Procedures / Treatments   Labs (all labs ordered are listed, but only abnormal results are displayed) Labs Reviewed - No data to display  EKG None  Radiology No results found.  Procedures Procedures    Medications Ordered in ED Medications - No data to display  ED Course/ Medical Decision Making/ A&P                                 Medical Decision Making Amount and/or Complexity of Data Reviewed Radiology: ordered.   Medical Decision Making / ED Course   This patient presents to the ED for concern of right-sided chest wall pain, this involves an extensive number of treatment  options, and is a complaint that carries with it a high risk of complications and morbidity.  The differential diagnosis includes contusion, rib fracture, pneumonia, left knee fracture, left knee sprain  MDM: 78 year old female presents today for concern of a fall that occurred about 1-2 weeks ago.  She is accompanied by her daughter and great-grandson.  She Obtained x-ray of her chest as well as left knee.  Denies any head injury or loss of consciousness. Delayed getting evaluated due to her husband's illness. X-ray of chest and left knee did not show any acute findings. Symptomatic management discussed.  Likely a rib contusion.  Discussed follow-up with her PCP.  Discharged in stable condition.  Return precaution discussed.  Patient voices understanding and is in agreement with plan.   Additional history obtained: -Additional history obtained from daughter at bedside -External records from outside source obtained and reviewed including: Chart review including previous notes, labs, imaging, consultation notes   Lab Tests: -I ordered, reviewed, and interpreted labs.   The pertinent results include:   Labs Reviewed - No data to display    EKG  EKG Interpretation Date/Time:    Ventricular Rate:    PR Interval:    QRS Duration:    QT Interval:    QTC Calculation:   R Axis:      Text Interpretation:           Imaging Studies ordered: I ordered imaging studies including chest x-ray, left knee x-ray I independently visualized and interpreted imaging. I agree with the radiologist interpretation   Medicines ordered and prescription drug management: No orders of the defined types were placed in this encounter.   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       Dispostion: Discharged in stable condition.  Return precaution discussed.  Discussed close follow-up with PCP.    Final Clinical  Impression(s) / ED Diagnoses Final diagnoses:  Fall, initial encounter    Rx / DC Orders ED Discharge Orders     None         Lucina Sabal, PA-C 12/24/23 1620    Lind Repine, MD 12/27/23 [phone]

## 2023-12-27 ENCOUNTER — Other Ambulatory Visit: Payer: Self-pay | Admitting: Internal Medicine

## 2024-01-07 ENCOUNTER — Other Ambulatory Visit: Payer: Self-pay | Admitting: Internal Medicine

## 2024-01-07 NOTE — Telephone Encounter (Signed)
 Copied from CRM [id number]. Topic: Medicare AWV >> Jan 07, 2024  1:20 PM Juliana Ocean wrote: Reason for CRM: LVM 01/07/2024 to schedule AWV. Please schedule Virtual or Telehealth visits ONLY.   Rosalee Collins; Care Guide Ambulatory Clinical Support [place] [facility] Direct Dial: [phone]

## 2024-01-14 MED ORDER — SERTRALINE HCL 100 MG PO TABS: 200.0000 mg | ORAL_TABLET | Freq: Every day | ORAL | 1 refills | Status: DC

## 2024-01-14 NOTE — Addendum Note (Signed)
 Addended by: ONEITA SLOUGH R on: 01/14/2024 11:07 AM   Modules accepted: Orders

## 2024-01-14 NOTE — Telephone Encounter (Signed)
 Copied from CRM [id number]. Topic: Clinical - Medication Refill >> Jan 14, 2024 10:48 AM Aleatha C wrote: Medication: sertraline  (ZOLOFT ) 100 MG tablet  Has the patient contacted their pharmacy? No (Agent: If no, request that the patient contact the pharmacy for the refill. If patient does not wish to contact the pharmacy document the reason why and proceed with request.) (Agent: If yes, when and what did the pharmacy advise?)  This is the patient's preferred pharmacy:  [facility] [address] [place] - [address] AT [address] [place] [address] Phone: [phone] Fax: [phone]  Is this the correct pharmacy for this prescription? Yes If no, delete pharmacy and type the correct one.   Has the prescription been filled recently? No  Is the patient out of the medication? No  Has the patient been seen for an appointment in the last year OR does the patient have an upcoming appointment? Yes  Can we respond through MyChart? No  Agent: Please be advised that Rx refills may take up to 3 business days. We ask that you follow-up with your pharmacy.

## 2024-01-14 NOTE — Telephone Encounter (Signed)
 Rx sent.

## 2024-01-14 NOTE — Addendum Note (Signed)
 Addended by: Jenavieve Freda D on: 01/14/2024 11:19 AM   Modules accepted: Orders

## 2024-02-11 ENCOUNTER — Encounter: Payer: Self-pay | Admitting: Internal Medicine

## 2024-02-11 ENCOUNTER — Ambulatory Visit: Payer: Medicare HMO | Admitting: Internal Medicine

## 2024-03-03 ENCOUNTER — Other Ambulatory Visit: Payer: Self-pay

## 2024-03-03 ENCOUNTER — Encounter (HOSPITAL_BASED_OUTPATIENT_CLINIC_OR_DEPARTMENT_OTHER): Payer: Self-pay | Admitting: Emergency Medicine

## 2024-03-03 ENCOUNTER — Emergency Department (HOSPITAL_BASED_OUTPATIENT_CLINIC_OR_DEPARTMENT_OTHER)
Admission: EM | Admit: 2024-03-03 | Discharge: 2024-03-03 | Disposition: A | Discharge status: Home / Self Care | Attending: Emergency Medicine | Admitting: Emergency Medicine

## 2024-03-03 DIAGNOSIS — R21 Rash and other nonspecific skin eruption: Secondary | ICD-10-CM

## 2024-03-03 MED ORDER — VALACYCLOVIR HCL 1 G PO TABS: 1000.0000 mg | ORAL_TABLET | Freq: Three times a day (TID) | ORAL | 0 refills | Status: AC

## 2024-03-03 MED ORDER — PREDNISONE 10 MG (21) PO TBPK: ORAL_TABLET | Freq: Every day | ORAL | 0 refills | Status: DC

## 2024-03-03 MED ORDER — VALACYCLOVIR HCL 500 MG PO TABS
1000.0000 mg | ORAL_TABLET | Freq: Once | ORAL | Status: AC
  Administered 2024-03-03 (×2): 1000 mg via ORAL
  Filled 2024-03-03: qty 2

## 2024-03-03 NOTE — ED Provider Notes (Signed)
 [place] [facility] Provider Note   CSN: [id number] Arrival date & time: 03/03/24  1801     Patient presents with: Rash   Wanda [name] is a 78 y.o. female with multiple medical problems here for evaluation of rash to the left shoulder.  Describes as burning, itching and aching.  She is on gabapentin  for chronic pain.  She has been trying alcohol wipes and mustard to the area.  No known sick contacts.  No fever.  No bony pain, numbness or weakness.  No chest pain or shortness of breath.  No history of similar.  Rash to palms or soles.  No new lotions, perfumes or detergents.  Denies any possible insect bites.  No known bedbugs.   HPI     Prior to Admission medications   Medication Sig Start Date End Date Taking? Authorizing Provider  predniSONE  (STERAPRED UNI-PAK 21 TAB) 10 MG (21) TBPK tablet Take by mouth daily. Take 6 tabs by mouth daily  for 1 days, then 5 tabs for 1 days, then 4 tabs for 1 days, then 3 tabs for 1 days, 2 tabs for 1 days, then 1 tab by mouth daily for 1 days 03/03/24  Yes Anissa Abbs A, PA-C  valACYclovir  (VALTREX ) 1000 MG tablet Take 1 tablet (1,000 mg total) by mouth 3 (three) times daily for 7 days. 03/03/24 03/10/24 Yes Dailah Opperman A, PA-C  atorvastatin  (LIPITOR) 80 MG tablet Take 1 tablet (80 mg total) by mouth at bedtime. 10/15/23   Paz, Jose E, MD  calcium  carbonate (CALCIUM  600) 600 MG TABS tablet Take 1 tablet (600 mg total) by mouth 2 (two) times daily with a meal. 09/17/19   Tanya Glisson, MD  Cholecalciferol (VITAMIN D ) 50 MCG (2000 UT) tablet Take 2,000 Units by mouth daily.    [provider]  cyclobenzaprine  (FLEXERIL ) 10 MG tablet Take 1 tablet (10 mg total) by mouth at bedtime as needed for muscle spasms. Patient not taking: Reported on 08/14/2023 12/13/22   Amon Aloysius BRAVO, MD  ezetimibe  (ZETIA ) 10 MG tablet Take 1 tablet (10 mg total) by mouth daily. 12/28/23   Amon Aloysius BRAVO, MD  gabapentin   (NEURONTIN ) 300 MG capsule Take 1 capsule (300 mg total) by mouth 3 (three) times daily. 08/14/23   Amon Aloysius BRAVO, MD  hydrochlorothiazide  (HYDRODIURIL ) 25 MG tablet Take 1 tablet (25 mg total) by mouth daily. 11/28/23   Amon Aloysius BRAVO, MD  lisinopril  (ZESTRIL ) 10 MG tablet Take 1 tablet (10 mg total) by mouth daily. 11/16/23   Amon Aloysius BRAVO, MD  sertraline  (ZOLOFT ) 100 MG tablet Take 2 tablets (200 mg total) by mouth daily. 01/14/24   Amon Aloysius BRAVO, MD    Allergies: Atorvastatin  and Simvastatin    Review of Systems  Constitutional: Negative.   HENT: Negative.    Respiratory: Negative.    Cardiovascular: Negative.   Gastrointestinal: Negative.   Genitourinary: Negative.   Musculoskeletal: Negative.   Skin:  Positive for rash.  Neurological: Negative.   All other systems reviewed and are negative.   Updated Vital Signs BP 116/73 (BP Location: Right Arm)   Pulse 80   Temp 98.7 F (37.1 C) (Oral)   Resp 20   Ht 5' 4 (1.626 m)   Wt 99.8 kg   SpO2 99%   BMI 37.76 kg/m   Physical Exam Vitals and nursing note reviewed.  Constitutional:      General: She is not in acute distress.  Appearance: She is well-developed. She is not ill-appearing, toxic-appearing or diaphoretic.  HENT:     Head: Atraumatic.  Eyes:     Pupils: Pupils are equal, round, and reactive to light.  Neck:   Cardiovascular:     Rate and Rhythm: Normal rate and regular rhythm.  Pulmonary:     Effort: Pulmonary effort is normal. No respiratory distress.  Abdominal:     General: There is no distension.     Palpations: Abdomen is soft.  Musculoskeletal:        General: Normal range of motion.     Cervical back: Normal range of motion and neck supple.  Skin:    General: Skin is warm and dry.     Findings: Rash present.     Comments: Scabbed over vesicular rash to C6 distribution on left.  No surrounding fluctuance, induration, petechiae, purpura, target lesions no rash to palms or soles  Neurological:     General:  No focal deficit present.     Mental Status: She is alert and oriented to person, place, and time.     (all labs ordered are listed, but only abnormal results are displayed) Labs Reviewed - No data to display  EKG: None  Radiology: No results found.   Procedures   78 year old here for evaluation of rash that she noticed on Saturday.  Describes as burning and pruritic.  On exam she has 4-5 scattered vesicular rash to the C6 distribution on the left.  No surrounding edema, erythema, warmth.  No rash to palms or soles.  I suspect she likely has herpes zoster.  No systemic symptoms.  She is on gabapentin  at home.  Will start on steroids and Valtrex .  Low suspicion for secondary bacterial infection.  Patient denies any difficulty breathing or swallowing.  Pt has a patent airway without stridor and is handling secretions without difficulty; no angioedema. No blisters, no pustules, no warmth, no draining sinus tracts, no superficial abscesses, no bullous impetigo, no desquamation, no target lesions with dusky purpura or a central bulla. Not tender to touch. No concern for superimposed infection. No concern for SJS, TEN, TSS, tick borne illness, syphilis or other life-threatening condition.   The patient has been appropriately medically screened and/or stabilized in the ED. I have low suspicion for any other emergent medical condition which would require further screening, evaluation or treatment in the ED or require inpatient management.  Patient is hemodynamically stable and in no acute distress.  Patient able to ambulate in department prior to ED.  Evaluation does not show acute pathology that would require ongoing or additional emergent interventions while in the emergency department or further inpatient treatment.  I have discussed the diagnosis with the patient and answered all questions.  Pain is been managed while in the emergency department and patient has no further complaints prior to  discharge.  Patient is comfortable with plan discussed in room and is stable for discharge at this time.  I have discussed strict return precautions for returning to the emergency department.  Patient was encouraged to follow-up with PCP/specialist refer to at discharge.   Medications Ordered in the ED  valACYclovir  (VALTREX ) tablet 1,000 mg (1,000 mg Oral Given 03/03/24 2002)                                    Medical Decision Making Amount and/or Complexity of Data Reviewed External Data Reviewed: labs,  radiology and notes.  Risk OTC drugs. Prescription drug management. Decision regarding hospitalization. Diagnosis or treatment significantly limited by social determinants of health.        Final diagnoses:  Rash    ED Discharge Orders          Ordered    predniSONE  (STERAPRED UNI-PAK 21 TAB) 10 MG (21) TBPK tablet  Daily        03/03/24 1942    valACYclovir  (VALTREX ) 1000 MG tablet  3 times daily        03/03/24 1942               Kohle Winner A, PA-C 03/03/24 2020    Elnor Jayson LABOR, DO 03/03/24 2137

## 2024-03-03 NOTE — ED Provider Notes (Signed)
 [place] [facility] Provider Note   CSN: [id number] Arrival date & time: 03/03/24  1801     Patient presents with: Rash   Wanda [name] is a 78 y.o. female with multiple medical problems here for evaluation of rash to the left shoulder.  Describes as burning, itching and aching.  She is on gabapentin  for chronic pain.  She has been trying alcohol wipes and mustard to the area.  No known sick contacts.  No fever.  No bony pain, numbness or weakness.  No chest pain or shortness of breath.  No history of similar.  Rash to palms or soles.  No new lotions, perfumes or detergents.  Denies any possible insect bites.  No known bedbugs.   HPI     Prior to Admission medications   Medication Sig Start Date End Date Taking? Authorizing Provider  predniSONE  (STERAPRED UNI-PAK 21 TAB) 10 MG (21) TBPK tablet Take by mouth daily. Take 6 tabs by mouth daily  for 1 days, then 5 tabs for 1 days, then 4 tabs for 1 days, then 3 tabs for 1 days, 2 tabs for 1 days, then 1 tab by mouth daily for 1 days 03/03/24  Yes Tyeasha Ebbs A, PA-C  valACYclovir  (VALTREX ) 1000 MG tablet Take 1 tablet (1,000 mg total) by mouth 3 (three) times daily for 7 days. 03/03/24 03/10/24 Yes Melvia Matousek A, PA-C  atorvastatin  (LIPITOR) 80 MG tablet Take 1 tablet (80 mg total) by mouth at bedtime. 10/15/23   Paz, Jose E, MD  calcium  carbonate (CALCIUM  600) 600 MG TABS tablet Take 1 tablet (600 mg total) by mouth 2 (two) times daily with a meal. 09/17/19   Tanya Glisson, MD  Cholecalciferol (VITAMIN D ) 50 MCG (2000 UT) tablet Take 2,000 Units by mouth daily.    [provider]  cyclobenzaprine  (FLEXERIL ) 10 MG tablet Take 1 tablet (10 mg total) by mouth at bedtime as needed for muscle spasms. Patient not taking: Reported on 08/14/2023 12/13/22   Amon Aloysius BRAVO, MD  ezetimibe  (ZETIA ) 10 MG tablet Take 1 tablet (10 mg total) by mouth daily. 12/28/23   Amon Aloysius BRAVO, MD  gabapentin   (NEURONTIN ) 300 MG capsule Take 1 capsule (300 mg total) by mouth 3 (three) times daily. 08/14/23   Amon Aloysius BRAVO, MD  hydrochlorothiazide  (HYDRODIURIL ) 25 MG tablet Take 1 tablet (25 mg total) by mouth daily. 11/28/23   Amon Aloysius BRAVO, MD  lisinopril  (ZESTRIL ) 10 MG tablet Take 1 tablet (10 mg total) by mouth daily. 11/16/23   Amon Aloysius BRAVO, MD  sertraline  (ZOLOFT ) 100 MG tablet Take 2 tablets (200 mg total) by mouth daily. 01/14/24   Amon Aloysius BRAVO, MD    Allergies: Atorvastatin  and Simvastatin    Review of Systems  Constitutional: Negative.   HENT: Negative.    Respiratory: Negative.    Cardiovascular: Negative.   Gastrointestinal: Negative.   Genitourinary: Negative.   Musculoskeletal: Negative.   Skin:  Positive for rash.  Neurological: Negative.   All other systems reviewed and are negative.   Updated Vital Signs BP 116/73 (BP Location: Right Arm)   Pulse 80   Temp 98.7 F (37.1 C) (Oral)   Resp 20   Ht 5' 4 (1.626 m)   Wt 99.8 kg   SpO2 99%   BMI 37.76 kg/m   Physical Exam Vitals and nursing note reviewed.  Constitutional:      General: She is not in acute distress.  Appearance: She is well-developed. She is not ill-appearing, toxic-appearing or diaphoretic.  HENT:     Head: Atraumatic.  Eyes:     Pupils: Pupils are equal, round, and reactive to light.  Neck:   Cardiovascular:     Rate and Rhythm: Normal rate and regular rhythm.  Pulmonary:     Effort: Pulmonary effort is normal. No respiratory distress.  Abdominal:     General: There is no distension.     Palpations: Abdomen is soft.  Musculoskeletal:        General: Normal range of motion.     Cervical back: Normal range of motion and neck supple.  Skin:    General: Skin is warm and dry.     Findings: Rash present.     Comments: Scabbed over vesicular rash to C6 distribution on left.  No surrounding fluctuance, induration, petechiae, purpura, target lesions no rash to palms or soles  Neurological:     General:  No focal deficit present.     Mental Status: She is alert and oriented to person, place, and time.     (all labs ordered are listed, but only abnormal results are displayed) Labs Reviewed - No data to display  EKG: None  Radiology: No results found.   Procedures   78 year old here for evaluation of rash that she noticed on Saturday.  Describes as burning and pruritic.  On exam she has 4-5 scattered vesicular rash to the C6 distribution on the left.  No surrounding edema, erythema, warmth.  No rash to palms or soles.  I suspect she likely has herpes zoster.  No systemic symptoms.  She is on gabapentin  at home.  Will start on steroids and Valtrex .  Low suspicion for secondary bacterial infection.  Patient denies any difficulty breathing or swallowing.  Pt has a patent airway without stridor and is handling secretions without difficulty; no angioedema. No blisters, no pustules, no warmth, no draining sinus tracts, no superficial abscesses, no bullous impetigo, no desquamation, no target lesions with dusky purpura or a central bulla. Not tender to touch. No concern for superimposed infection. No concern for SJS, TEN, TSS, tick borne illness, syphilis or other life-threatening condition.   The patient has been appropriately medically screened and/or stabilized in the ED. I have low suspicion for any other emergent medical condition which would require further screening, evaluation or treatment in the ED or require inpatient management.  Patient is hemodynamically stable and in no acute distress.  Patient able to ambulate in department prior to ED.  Evaluation does not show acute pathology that would require ongoing or additional emergent interventions while in the emergency department or further inpatient treatment.  I have discussed the diagnosis with the patient and answered all questions.  Pain is been managed while in the emergency department and patient has no further complaints prior to  discharge.  Patient is comfortable with plan discussed in room and is stable for discharge at this time.  I have discussed strict return precautions for returning to the emergency department.  Patient was encouraged to follow-up with PCP/specialist refer to at discharge.   Medications Ordered in the ED  valACYclovir  (VALTREX ) tablet 1,000 mg (1,000 mg Oral Given 03/03/24 2002)                                    Medical Decision Making Amount and/or Complexity of Data Reviewed External Data Reviewed: labs,  radiology and notes.  Risk OTC drugs. Prescription drug management. Decision regarding hospitalization. Diagnosis or treatment significantly limited by social determinants of health.        Final diagnoses:  Rash    ED Discharge Orders          Ordered    predniSONE  (STERAPRED UNI-PAK 21 TAB) 10 MG (21) TBPK tablet  Daily        03/03/24 1942    valACYclovir  (VALTREX ) 1000 MG tablet  3 times daily        03/03/24 1942               Lindzee Gouge A, PA-C 03/03/24 2020    Elnor Jayson LABOR, DO 03/03/24 2137

## 2024-03-03 NOTE — ED Triage Notes (Addendum)
 Pt c/o rash to L shoulder that is burning and itching since Saturday.

## 2024-03-03 NOTE — Discharge Instructions (Signed)
 It was a pleasure taking care of you here today  Your rash does look like you have developed shingles.  Continue taking your gabapentin  at home.  I have written you for prednisone  as well as some Valtrex   Make sure to follow-up with your primary care provider in few days for reevaluation  As we discussed if you around anyone who has not had chickenpox, is too young for the vaccine please be aware that they can get chickenpox from shingles.  Make sure to follow-up outpatient, return for any worsening symptoms

## 2024-03-03 NOTE — ED Notes (Signed)
 Discharge instructions reviewed.   Newly prescribed medications discussed. Pharmacy verified.   Opportunity for questions and concerns provided.   Alert, oriented and ambulatory.   Displays no signs of distress.   Encouraged to follow up with PCP with worsening symptoms. Declined discharge vital signs.

## 2024-03-18 DIAGNOSIS — M1712 Unilateral primary osteoarthritis, left knee: Secondary | ICD-10-CM | POA: Diagnosis not present

## 2024-03-25 DIAGNOSIS — M25562 Pain in left knee: Secondary | ICD-10-CM | POA: Diagnosis not present

## 2024-03-25 DIAGNOSIS — M1712 Unilateral primary osteoarthritis, left knee: Secondary | ICD-10-CM | POA: Diagnosis not present

## 2024-04-01 DIAGNOSIS — M1712 Unilateral primary osteoarthritis, left knee: Secondary | ICD-10-CM | POA: Diagnosis not present

## 2024-04-01 DIAGNOSIS — M25562 Pain in left knee: Secondary | ICD-10-CM | POA: Diagnosis not present

## 2024-04-19 ENCOUNTER — Other Ambulatory Visit: Payer: Self-pay | Admitting: Internal Medicine

## 2024-04-21 ENCOUNTER — Ambulatory Visit: Payer: Self-pay

## 2024-04-21 NOTE — Telephone Encounter (Signed)
 Endorses fatigue for the last two weeks. Patient unsure of what could be causing the fatigue. Patient states she is weak and tired but that she is having difficulty sleeping as well. Appointment scheduled for tomorrow at 2:40 PM  FYI Only or Action Required?: FYI only for provider.  Patient was last seen in primary care on 08/14/2023 by Amon Aloysius BRAVO, MD.  Called Nurse Triage reporting Fatigue.  Symptoms began several weeks ago.  Interventions attempted: Rest, hydration, or home remedies.  Symptoms are: unchanged.  Triage Disposition: See Physician Within 24 Hours  Patient/caregiver understands and will follow disposition?: Yes  Copied from CRM #[id number]. Topic: Clinical - Red Word Triage >> Apr 21, 2024  2:57 PM Alfonso ORN wrote: Red Word that prompted transfer to Nurse Triage: feeling really weak and unsure of why for about 2 weeks. Reason for Disposition  [1] MODERATE weakness (e.g., interferes with work, school, normal activities) AND [2] persists > 3 days  Answer Assessment - Initial Assessment Questions 1. DESCRIPTION: Describe how you are feeling.     Reports very weak and tired. Unable to sleep at night 2. SEVERITY: How bad is it?  Can you stand and walk?     moderate 3. ONSET: When did these symptoms begin? (e.g., hours, days, weeks, months)     Started two weeks ago 4. CAUSE: What do you think is causing the weakness or fatigue? (e.g., not drinking enough fluids, medical problem, trouble sleeping)     unsure 5. NEW MEDICINES:  Have you started on any new medicines recently? (e.g., opioid pain medicines, benzodiazepines, muscle relaxants, antidepressants, antihistamines, neuroleptics, beta blockers)     No new medications 6. OTHER SYMPTOMS: Do you have any other symptoms? (e.g., chest pain, fever, cough, SOB, vomiting, diarrhea, bleeding, other areas of pain)     Cough,  Protocols used: Weakness (Generalized) and Fatigue-A-AH

## 2024-04-22 ENCOUNTER — Ambulatory Visit (INDEPENDENT_AMBULATORY_CARE_PROVIDER_SITE_OTHER): Admitting: Internal Medicine

## 2024-04-22 ENCOUNTER — Encounter: Payer: Self-pay | Admitting: Internal Medicine

## 2024-04-22 VITALS — BP 126/82 | HR 80 | Temp 98.2°F | Resp 18 | Ht 64.0 in | Wt 223.0 lb

## 2024-04-22 DIAGNOSIS — Z23 Encounter for immunization: Secondary | ICD-10-CM

## 2024-04-22 DIAGNOSIS — I10 Essential (primary) hypertension: Secondary | ICD-10-CM | POA: Diagnosis not present

## 2024-04-22 DIAGNOSIS — F32A Depression, unspecified: Secondary | ICD-10-CM

## 2024-04-22 DIAGNOSIS — F4320 Adjustment disorder, unspecified: Secondary | ICD-10-CM | POA: Diagnosis not present

## 2024-04-22 DIAGNOSIS — E785 Hyperlipidemia, unspecified: Secondary | ICD-10-CM

## 2024-04-22 DIAGNOSIS — F5102 Adjustment insomnia: Secondary | ICD-10-CM | POA: Diagnosis not present

## 2024-04-22 DIAGNOSIS — R5383 Other fatigue: Secondary | ICD-10-CM

## 2024-04-22 MED ORDER — ZOLPIDEM TARTRATE 5 MG PO TABS: 5.0000 mg | ORAL_TABLET | Freq: Every evening | ORAL | 1 refills | Status: AC | PRN

## 2024-04-22 NOTE — Patient Instructions (Addendum)
 GO TO THE LAB :  Get the blood work    Then, go to the front desk for the checkout Please make an appointment for a checkup in 3 months.  Sooner if needed   Other medication: Ambien , at bedtime to help you sleep  See a counselor, is important you talk with a professional       Please read more detailed instructions below     YOUR PLAN: DEPRESSION WITH ACUTE GRIEF REACTION: You are experiencing acute grief and depressive symptoms following the loss of your husband. -We provided you with a list of counselors for potential therapy. -We encourage you to communicate with family and friends to express your emotions.  INSOMNIA: You are having trouble falling and staying asleep, often not sleeping until 4 AM, which is related to your grief and emotional distress. -We prescribed Ambien  to assist with sleep.  HYPERTENSION: Your blood pressure is well-controlled with your current medications, but you do not regularly check your blood pressure at home. -Continue taking Hydrochlorothiazide  and Lisinopril  as prescribed. -We ordered blood tests  to monitor your kidney function and overall health.  HYPERLIPIDEMIA: Your cholesterol levels are well-managed with your current medications. -Continue taking Atorvastatin  80 mg and Ezetimibe  as prescribed.  GENERAL HEALTH MAINTENANCE:  -We administered your flu shot today. - Proceed with a COVID booster at the pharmacy                      Contains text generated by [employer].                                 Contains text generated by [employer].

## 2024-04-22 NOTE — Progress Notes (Signed)
 "  Subjective:    Patient ID: Wanda [name], female    DOB: [birth year], 78 y.o.   MRN: [id number]  DOS:  04/22/2024 Type of visit - description: follow up  Discussed the use of AI scribe software for clinical note transcription with the patient, who gave verbal consent to proceed.  History of Present Illness We discussed several issues:  Fatigue and insomnia - Extreme fatigue present since before her husband's passing, initially related to concerns about her health - Significant difficulty initiating sleep, often not falling asleep until around 4 AM - No daytime napping  Cough and upper respiratory symptoms - Persistent cough with phlegm, most bothersome at night - Attributes symptoms to allergies - No chest pain, respiratory difficulty, nausea, vomiting, or diarrhea  Mood disturbance and medication adherence - On medication for anxiety and depression but not adhering to prescribed regimen - No suicidal ideation  Hypertension and hyperlipidemia management - Current medications include hydrochlorothiazide , lisinopril , atorvastatin , and ezetimibe  - Does not regularly monitor blood pressure at home    Review of Systems Denies chest pain or difficulty breathing. 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     Past Surgical History:  Procedure Laterality Date   G3 P3       Current Outpatient Medications  Medication Instructions   atorvastatin  (LIPITOR) 80 mg, Oral, Daily at bedtime, Needs appt   calcium  carbonate (CALCIUM  600) 600 mg, Oral, 2 times daily with meals   cyclobenzaprine  (FLEXERIL ) 10 mg, Oral, At  bedtime PRN   ezetimibe  (ZETIA ) 10 mg, Oral, Daily   gabapentin  (NEURONTIN ) 300 mg, Oral, 3 times daily   hydrochlorothiazide  (HYDRODIURIL ) 25 mg, Oral, Daily   HYDROcodone -acetaminophen  (NORCO/VICODIN) 5-325 MG tablet 1 tablet, Every 4 hours PRN   lisinopril  (ZESTRIL ) 10 mg, Oral, Daily   predniSONE  (STERAPRED UNI-PAK 21 TAB) 10 MG (21) TBPK tablet Oral, Daily, Take 6 tabs by mouth daily  for 1 days, then 5 tabs for 1 days, then 4 tabs for 1 days, then 3 tabs for 1 days, 2 tabs for 1 days, then 1 tab by mouth daily for 1 days   sertraline  (ZOLOFT ) 200 mg, Oral, Daily   Vitamin D  2,000 Units, Daily       Objective:   Physical Exam BP 126/82   Pulse 80   Temp 98.2 F (36.8 C) (Oral)   Resp 18   Ht 5' 4 (1.626 m)   Wt 223 lb (101.2 kg)   SpO2 98%   BMI 38.28 kg/m  General:   Well developed, NAD, BMI noted. HEENT:  Normocephalic . Face symmetric, atraumatic Skin: Not pale. Not jaundice Neurologic:  alert & oriented X3.  Speech normal, gait appropriate for age and unassisted Psych--  Cognition and judgment appear intact.  Cooperative with normal attention span and concentration.  Behavior appropriate. Tearful throughout the visit   Assessment   Assessment HTN Hyperlipidemia Depression Morbid obesity MSK: DJD, chronic back-knee pain, hydrocodone  /gabapentin  rx by DR Tanya Fennel D  Def  PLAN:  Vitamin  D deficiency: Started ergocalciferol  Fatigue High cholesterol: Last LDL 97, improved BMP CBC Depression anxiety BMI 38  Assessment & Plan Depression with acute grief reaction This was the main topic of today's visit. Experiencing acute grief reaction following the loss of her husband, contributing to depressive symptoms. Reports feelings of guilt and difficulty coping. Denies suicidal ideation. - Provide a list of counselors for potential therapy. - Encourage communication with family and friends to express emotions. - Continue SSRIs Fatigue: Reports fatigue but  denies chest pain difficulty breathing or other physical symptoms.  Probably related to insomnia and depression.  Reassess on RTC Insomnia Difficulty sleeping exacerbated since the loss of her husband, with trouble falling and staying asleep, often not sleeping until 4 AM, attributed to acute grief reaction and emotional distress. - Prescribe Ambien  to assist with sleep. Hypertension well-controlled with current medication regimen although is not taking medications regularly.  Encouraged to do.  Continue hydrochlorothiazide  and Lisinopril . - Order BMP and CBC  Hyperlipidemia Hyperlipidemia managed with Atorvastatin  and Ezetimibe . Last LDL level satisfactory, indicating effective control of cholesterol levels.  No change General Health Maintenance Flu shot today, recommend a COVID booster RTC 3 months, sooner if needed.        Here for CPX - Td 2019 - PNM 23 2016; PNM 13 2015; PNM 20: 2023 - zostavax: 2014 - Had COVID and flu shot. - Vaccines I recommend: Shingrix , RSV. -Female care:  Last PAP 2017. [facility] 11/2022 per [facility], referral sent. -CCS: cscope 2014, was referred last year, GI unable to communicate with the patient.  Brother had colon cancer at age 46. Referral re send, advise pt to call (understanding she may not qualify d/t age and co morbidities) -DEXA  2012: Normal (and  improved compared to 2009 per report).  DEXA referral failed x2. -Labs: BMP AST ALT FLP CBC vitamin D  -Diet: Discussed - POA discussed Also, chronic medical problems as HTN: Normal Latoria BPs, BP looks good today, continue HCTZ, lisinopril .  Checking labs. Hyperlipidemia: On Lipitor 80, last LDL 160, I recommended zetia  which she is taking.  Labs. Depression anxiety: On Zoloft , symptoms are relatively well-controlled, unfortunately she has a number of challenges including taking care of her husband who has dementia. MSK: Currently has no pain management doctor, was let go from Dr. Tanya office.  She is  trying to get established with a new specialist. Vitamin D  deficiency: On OTCs, checking labs RTC 4 to 6 months    "

## 2024-04-23 DIAGNOSIS — G4700 Insomnia, unspecified: Secondary | ICD-10-CM | POA: Insufficient documentation

## 2024-04-23 LAB — BASIC METABOLIC PANEL WITH GFR
BUN: 7 mg/dL (ref 6–23)
CO2: 28 meq/L (ref 19–32)
Calcium: 9.9 mg/dL (ref 8.4–10.5)
Chloride: 102 meq/L (ref 96–112)
Creatinine, Ser: 0.63 mg/dL (ref 0.40–1.20)
GFR: 86.52 mL/min (ref >60.00)
Glucose, Bld: 83 mg/dL (ref 70–99)
Potassium: 4.2 meq/L (ref 3.5–5.1)
Sodium: 137 meq/L (ref 135–145)

## 2024-04-23 LAB — CBC WITH DIFFERENTIAL/PLATELET
Basophils Absolute: 0 K/uL (ref 0.0–0.1)
Basophils Relative: 1 % (ref 0.0–3.0)
Eosinophils Absolute: 0.1 K/uL (ref 0.0–0.7)
Eosinophils Relative: 2.6 % (ref 0.0–5.0)
HCT: 39.8 % (ref 36.0–46.0)
Hemoglobin: 13.3 g/dL (ref 12.0–15.0)
Lymphocytes Relative: 33.9 % (ref 12.0–46.0)
Lymphs Abs: 1.5 K/uL (ref 0.7–4.0)
MCHC: 33.4 g/dL (ref 30.0–36.0)
MCV: 97.2 fl (ref 78.0–100.0)
Monocytes Absolute: 0 K/uL — ABNORMAL LOW (ref 0.1–1.0)
Monocytes Relative: 0.5 % — ABNORMAL LOW (ref 3.0–12.0)
Neutro Abs: 2.7 K/uL (ref 1.4–7.7)
Neutrophils Relative %: 62 % (ref 43.0–77.0)
Platelets: 166 K/uL (ref 150.0–400.0)
RBC: 4.1 Mil/uL (ref 3.87–5.11)
RDW: 15.5 % (ref 11.5–15.5)
WBC: 4.4 K/uL (ref 4.0–10.5)

## 2024-04-23 NOTE — Assessment & Plan Note (Signed)
 Depression with acute grief reaction This was the main topic of today's visit. Experiencing acute grief reaction following the loss of her husband, contributing to depressive symptoms. Reports feelings of guilt and difficulty coping. Denies suicidal ideation. - Provide a list of counselors for potential therapy. - Encourage communication with family and friends to express emotions. - Continue SSRIs Fatigue: Reports fatigue but denies chest pain difficulty breathing or other physical symptoms.  Probably related to insomnia and depression.  Reassess on RTC Insomnia Difficulty sleeping exacerbated since the loss of her husband, with trouble falling and staying asleep, often not sleeping until 4 AM, attributed to acute grief reaction and emotional distress. - Prescribe Ambien  to assist with sleep. Hypertension well-controlled with current medication regimen although is not taking medications regularly.  Encouraged to do.  Continue hydrochlorothiazide  and Lisinopril . - Order BMP and CBC  Hyperlipidemia Hyperlipidemia managed with Atorvastatin  and Ezetimibe . Last LDL level satisfactory, indicating effective control of cholesterol levels.  No change General Health Maintenance Flu shot today, recommend a COVID booster RTC 3 months, sooner if needed.

## 2024-04-28 ENCOUNTER — Ambulatory Visit: Payer: Self-pay | Admitting: Internal Medicine

## 2024-05-17 ENCOUNTER — Other Ambulatory Visit: Payer: Self-pay | Admitting: Internal Medicine

## 2024-05-19 ENCOUNTER — Ambulatory Visit (INDEPENDENT_AMBULATORY_CARE_PROVIDER_SITE_OTHER): Admitting: *Deleted

## 2024-05-19 VITALS — Ht 64.0 in | Wt 223.0 lb

## 2024-05-19 DIAGNOSIS — Z0000 Encounter for general adult medical examination without abnormal findings: Secondary | ICD-10-CM

## 2024-05-19 NOTE — Patient Instructions (Signed)
 Wanda [name] , Thank you for taking time out of your busy schedule to complete your Annual Wellness Visit with me. I enjoyed our conversation and look forward to speaking with you again next year. I, as well as your care team,  appreciate your ongoing commitment to your health goals. Please review the following plan we discussed and let me know if I can assist you in the future. Your Game plan/ To Do List    Referrals: If you haven't heard from the office you've been referred to, please reach out to them at the phone provided.   Mammogram / Bone Density ([facility]):  [phone]  Follow up Visits: Next Medicare AWV with our clinical staff: 05/21/25 9:40am, telephone.    Next Office Visit with your provider: 07/29/24 10:20am, Dr Amon.  Clinician Recommendations:  Aim for 30 minutes of exercise or brisk walking, 6-8 glasses of water, and 5 servings of fruits and vegetables each day.   You will need to get the following vaccines at your local pharmacy: 2nd shingles vaccine      This is a list of the screening recommended for you and due dates:  Health Maintenance  Topic Date Due   Zoster (Shingles) Vaccine (2 of 2) 10/09/2023   COVID-19 Vaccine (8 - Pfizer risk 2025-26 season) 11/11/2024   Medicare Annual Wellness Visit  05/19/2025   DTaP/Tdap/Td vaccine (3 - Td or Tdap) 12/25/2027   Pneumococcal Vaccine for age over 56  Completed   Flu Shot  Completed   DEXA scan (bone density measurement)  Completed   Hepatitis C Screening  Completed   Meningitis B Vaccine  Aged Out   Breast Cancer Screening  Discontinued   Colon Cancer Screening  Discontinued    Advanced directives: (Provided) Advance directive discussed with you today. I have provided a copy for you to complete at home and have notarized. Once this is complete, please bring a copy in to our office so we can scan it into your chart.  Advance Care Planning is important because it:  [x]  Makes sure you receive the medical  care that is consistent with your values, goals, and preferences  [x]  It provides guidance to your family and loved ones and reduces their decisional burden about whether or not they are making the right decisions based on your wishes.  Follow the link provided in your after visit summary or read over the paperwork we have mailed to you to help you started getting your Advance Directives in place. If you need assistance in completing these, please reach out to us  so that we can help you!  See attachments for Preventive Care and Fall Prevention Tips.

## 2024-05-19 NOTE — Progress Notes (Signed)
 Subjective:   Wanda [name] is a 78 y.o. who presents for a Medicare Wellness preventive visit.  As a reminder, Annual Wellness Visits don't include a physical exam, and some assessments may be limited, especially if this visit is performed virtually. We may recommend an in-person follow-up visit with your provider if needed.  Visit Complete: Virtual I connected with  Wanda [name] on 05/19/24 by a audio enabled telemedicine application and verified that I am speaking with the correct person using two identifiers.  Patient Location: Home  Provider Location: Office/Clinic  I discussed the limitations of evaluation and management by telemedicine. The patient expressed understanding and agreed to proceed.  Vital Signs: Because this visit was a virtual/telehealth visit, some criteria may be missing or patient reported. Any vitals not documented were not able to be obtained and vitals that have been documented are patient reported.  VideoDeclined- This patient declined Librarian, academic. Therefore the visit was completed with audio only.  Persons Participating in Visit: Patient.  AWV Questionnaire: No: Patient Medicare AWV questionnaire was not completed prior to this visit.  Cardiac Risk Factors include: advanced age (>18men, >68 women);hypertension;dyslipidemia;Other (see comment), Risk factor comments: obesity     Objective:    Today's Vitals   05/19/24 0946  Weight: 223 lb (101.2 kg)  Height: 5' 4 (1.626 m)   Body mass index is 38.28 kg/m.     05/19/2024   10:01 AM 03/03/2024    6:08 PM 12/24/2023   11:51 AM 01/23/2023    1:10 PM 05/11/2022   11:00 AM 05/01/2022   12:41 PM 02/06/2022    2:48 PM  Advanced Directives  Does Patient Have a Medical Advance Directive? No No No No No No No  Would patient like information on creating a medical advance directive? Yes (MAU/Ambulatory/Procedural Areas - Information given)   No - Patient declined No -  Patient declined      Current Medications (verified) Outpatient Encounter Medications as of 05/19/2024  Medication Sig   atorvastatin  (LIPITOR) 80 MG tablet Take 1 tablet (80 mg total) by mouth at bedtime.   calcium  carbonate (CALCIUM  600) 600 MG TABS tablet Take 1 tablet (600 mg total) by mouth 2 (two) times daily with a meal.   Cholecalciferol (VITAMIN D ) 50 MCG (2000 UT) tablet Take 2,000 Units by mouth daily.   cyclobenzaprine  (FLEXERIL ) 10 MG tablet Take 1 tablet (10 mg total) by mouth at bedtime as needed for muscle spasms.   ezetimibe  (ZETIA ) 10 MG tablet Take 1 tablet (10 mg total) by mouth daily.   gabapentin  (NEURONTIN ) 300 MG capsule Take 1 capsule (300 mg total) by mouth 3 (three) times daily.   hydrochlorothiazide  (HYDRODIURIL ) 25 MG tablet Take 1 tablet (25 mg total) by mouth daily.   lisinopril  (ZESTRIL ) 10 MG tablet Take 1 tablet (10 mg total) by mouth daily.   sertraline  (ZOLOFT ) 100 MG tablet Take 2 tablets (200 mg total) by mouth daily.   zolpidem  (AMBIEN ) 5 MG tablet Take 1 tablet (5 mg total) by mouth at bedtime as needed for sleep.   [DISCONTINUED] HYDROcodone -acetaminophen  (NORCO/VICODIN) 5-325 MG tablet Take 1 tablet by mouth every 4 (four) hours as needed. (Patient not taking: Reported on 05/19/2024)   No facility-administered encounter medications on file as of 05/19/2024.    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    Past Surgical History:  Procedure Laterality Date   G3 P3      Family History  Problem Relation Age of Onset   Cancer Sister        ??CERVICAL   Cancer Brother        STOMACH   Breast cancer Sister         sister dx age 48, aunt, cousin x 2    Colon cancer Brother        dx age ~ 57 ?   Diabetes Other        aunt    Kidney cancer Son    Breast cancer Maternal Aunt    Coronary artery disease Neg Hx    CAD Neg Hx    Social History   Socioeconomic History   Marital status: Widowed    Spouse name: Not on file   Number of children: 3   Years of education: Not on file   Highest education level: Not on file  Occupational History   Occupation: ON DISABILITY  Tobacco Use   Smoking status: Never   Smokeless tobacco: Never  Vaping Use   Vaping status: Never Used  Substance and Sexual Activity   Alcohol use: No   Drug use: No   Sexual activity: Not Currently  Other Topics Concern   Not on file  Social History Narrative   Married, 3 children (2 sons, a daughter), 6 G-kids , 5 g-g-children   Household:  pt, husband w/ dementia [place] is the main caregiver), daughter live w/ them        Social Drivers of Corporate Investment Banker Strain: Low Risk  (01/17/2022)   Overall Financial Resource Strain (CARDIA)    Difficulty of Paying Living Expenses: Not hard at all  Food Insecurity: No Food Insecurity (05/19/2024)   Hunger Vital Sign    Worried About Running Out of Food in the Last Year: Never true    Ran Out of Food in the Last Year: Never true  Transportation Needs: No Transportation Needs (05/19/2024)   PRAPARE - Administrator, Civil Service (Medical): No    Lack of Transportation (Non-Medical): No  Physical Activity: Inactive (05/19/2024)   Exercise Vital Sign    Days of Exercise per Week: 0 days    Minutes of Exercise per Session: 0 min  Stress: No Stress Concern Present (05/19/2024)   [employer] of Occupational Health - Occupational Stress Questionnaire    Feeling of Stress: Not at all  Social Connections: Moderately Isolated (05/19/2024)   Social Connection and Isolation Panel    Frequency of Communication with Friends and Family: More than three times  a week    Frequency of Social Gatherings with Friends and Family: Three times a week    Attends Religious Services: More than 4 times per year    Active Member of Clubs or Organizations: No    Attends Banker Meetings: Never    Marital Status: Widowed    Tobacco Counseling Counseling given: Not Answered    Clinical Intake:  Pre-visit preparation completed: Yes  Pain : No/denies pain     BMI - recorded: 38.28 Nutritional Status: BMI >  30  Obese Nutritional Risks: None Diabetes: No  No results found for: HGBA1C   How often do you need to have someone help you when you read instructions, pamphlets, or other written materials from your doctor or pharmacy?: 1 - Never  Interpreter Needed?: No  Information entered by :: Lolita Libra, CMA(AAMA)   Activities of Daily Living     05/19/2024    9:52 AM 04/22/2024    2:39 PM  In your present state of health, do you have any difficulty performing the following activities:  Hearing? 0 0  Vision? 0 0  Difficulty concentrating or making decisions? 0 0  Walking or climbing stairs? 1 1  Comment has bad knee and back. Has lift chair in the home   Dressing or bathing? 0 0  Doing errands, shopping? 0 0  Preparing Food and eating ? N   Using the Toilet? N   Managing your Medications? N   Managing your Finances? N   Housekeeping or managing your Housekeeping? N     Patient Care Team: Amon Aloysius BRAVO, MD as PCP - General Tanya Glisson, MD as Referring Physician (Pain Medicine) Teressa Toribio SQUIBB, MD (Inactive) as Attending Physician (Gastroenterology) [facility])  I have updated your Care Teams any recent Medical Services you may have received from other providers in the past year.     Assessment:   This is a routine wellness examination for Wanda [name].  Hearing/Vision screen Hearing Screening - Comments:: Denies hearing difficulties.  Vision Screening - Comments:: Up to date with routine eye  exams with Maude Abigail   Goals Addressed   None    Depression Screen     05/19/2024    9:57 AM 04/22/2024    3:00 PM 08/14/2023    1:14 PM 01/23/2023    1:16 PM 12/13/2022   11:13 AM 09/06/2022    9:57 AM 05/11/2022   11:00 AM  PHQ 2/9 Scores  PHQ - 2 Score 1 5 2 2 4 3 1   PHQ- 9 Score 7 17 14  13 15      Fall Risk     05/19/2024    9:54 AM 04/22/2024    2:39 PM 08/14/2023   12:55 PM 01/23/2023    1:09 PM 12/13/2022   10:50 AM  Fall Risk   Falls in the past year? 0 0 0 0 0  Number falls in past yr: 0 0 0 0 0  Injury with Fall? 0 0 0 0 0  Risk for fall due to : Orthopedic patient   No Fall Risks   Follow up Education provided Falls evaluation completed;Education provided Falls evaluation completed;Education provided Falls evaluation completed Falls evaluation completed    MEDICARE RISK AT HOME:  Medicare Risk at Home Any stairs in or around the home?: Yes If so, are there any without handrails?: No Home free of loose throw rugs in walkways, pet beds, electrical cords, etc?: Yes Adequate lighting in your home to reduce risk of falls?: Yes Life alert?: No Use of a cane, walker or w/c?: Yes (cane) Grab bars in the bathroom?: Yes Shower chair or bench in shower?: Yes Elevated toilet seat or a handicapped toilet?: Yes  TIMED UP AND GO:  Was the test performed?  No, audio  Cognitive Function: 6CIT completed    08/30/2016    9:07 AM  MMSE - Mini Mental State Exam  Orientation to time 5   Orientation to Place 5   Registration 3  Attention/ Calculation 5   Recall 2   Language- name 2 objects 2   Language- repeat 1  Language- follow 3 step command 3   Language- read & follow direction 1   Write a sentence 1   Copy design 1   Total score 29      Data saved with a previous flowsheet row definition        05/19/2024   10:02 AM 01/23/2023    1:20 PM 01/17/2022    3:58 PM  6CIT Screen  What Year? 0 points 0 points 0 points  What month? 0 points 0 points 0 points  What  time? 0 points 0 points 0 points  Count back from 20 0 points 0 points 0 points  Months in reverse 0 points 0 points 0 points  Repeat phrase 0 points 2 points 2 points  Total Score 0 points 2 points 2 points    Immunizations Immunization History  Administered Date(s) Administered   Fluad Quad(high Dose 65+) 03/26/2019   INFLUENZA, HIGH DOSE SEASONAL PF 05/07/2013, 06/08/2021, 04/22/2024   Influenza Split 05/01/2011, 03/25/2014, 04/16/2018   Influenza Whole 04/17/2008, 05/05/2009, 04/11/2010   Influenza-Unspecified 04/01/2015, 05/01/2016, 05/21/2017, 05/24/2022, 04/24/2023   PFIZER(Purple Top)SARS-COV-2 Vaccination 08/14/2019, 09/04/2019, 04/21/2020   PNEUMOCOCCAL CONJUGATE-20 03/06/2022, 09/06/2022   Pfizer Covid-19 Vaccine Bivalent Booster 59yrs & up 06/08/2021, 05/29/2022   Pfizer(Comirnaty)Fall Seasonal Vaccine 12 years and older 04/24/2023, 05/13/2024   Pneumococcal Conjugate-13 04/16/2014   Pneumococcal Polysaccharide-23 04/01/2015   Td 04/17/2008   Tdap 12/24/2017   Zoster Recombinant(Shingrix ) 08/14/2023   Zoster, Live 05/20/2013    Screening Tests Health Maintenance  Topic Date Due   Zoster Vaccines- Shingrix  (2 of 2) 10/09/2023   COVID-19 Vaccine (8 - Pfizer risk 2025-26 season) 11/11/2024   Medicare Annual Wellness (AWV)  05/19/2025   DTaP/Tdap/Td (3 - Td or Tdap) 12/25/2027   Pneumococcal Vaccine: 50+ Years  Completed   Influenza Vaccine  Completed   DEXA SCAN  Completed   Hepatitis C Screening  Completed   Meningococcal B Vaccine  Aged Out   Mammogram  Discontinued   Colonoscopy  Discontinued    Health Maintenance Items Addressed: Due for 2nd Shingles vaccine. Mammogram and DEXA previously ordered. Provided number to call and schedule.  Additional Screening:  Vision Screening: Recommended annual ophthalmology exams for early detection of glaucoma and other disorders of the eye. Is the patient up to date with their annual eye exam?  Yes  Who is the  provider or what is the name of the office in which the patient attends annual eye exams? [facility]  Dental Screening: Recommended annual dental exams for proper oral hygiene  Community Resource Referral / Chronic Care Management: CRR required this visit?  No   CCM required this visit?  No   Plan:    I have personally reviewed and noted the following in the patient's chart:   Medical and social history Use of alcohol, tobacco or illicit drugs  Current medications and supplements including opioid prescriptions. Patient is not currently taking opioid prescriptions. Functional ability and status Nutritional status Physical activity Advanced directives List of other physicians Hospitalizations, surgeries, and ER visits in previous 12 months Vitals Screenings to include cognitive, depression, and falls Referrals and appointments  In addition, I have reviewed and discussed with patient certain preventive protocols, quality metrics, and best practice recommendations. A written personalized care plan for preventive services as well as general preventive health recommendations were provided to patient.   Doyle Tegethoff,  CMA   05/19/2024   After Visit Summary: (Mail) Due to this being a telephonic visit, the after visit summary with patients personalized plan was offered to patient via mail   Notes: Nothing significant to report at this time.

## 2024-05-25 ENCOUNTER — Other Ambulatory Visit: Payer: Self-pay | Admitting: Internal Medicine

## 2024-06-10 ENCOUNTER — Ambulatory Visit: Payer: Self-pay

## 2024-06-10 NOTE — Telephone Encounter (Signed)
 Spoke w/ Pt- informed that PCP is out on medical leave until mid December 2025. Appt scheduled w/ [place].

## 2024-06-10 NOTE — Telephone Encounter (Signed)
 FYI Only or Action Required?: Action required by provider: request for appointment.  Patient was last seen in primary care on 04/22/2024 by Wanda Aloysius BRAVO, MD.  Called Nurse Triage reporting Tingling.  Symptoms began several days ago.  Interventions attempted: Nothing.  Symptoms are: unchanged.  Triage Disposition: See PCP When Office is Open (Within 3 Days)  Patient/caregiver understands and will follow disposition?: No, wishes to speak with PCP   Copied from CRM #[id number]. Topic: Clinical - Red Word Triage >> Jun 10, 2024 11:06 AM Adelita E wrote: [facility] that prompted transfer to Nurse Triage: Tingling sensation in fingers on both hands, along with an achy feeling. Patient stated it has been going on for a couple years with no improvement. Reason for Disposition  [1] Weakness or numbness in hand or fingers AND [2] present > 2 weeks  Answer Assessment - Initial Assessment Questions No available appts pcp. Patient requests appt with Dr. Amon; reports MD already aware of symptoms.  Advised call back or ED if symptoms worsen.  1. ONSET: When did the pain start?     2 years ago in left hand, now month ago in right hand 2. LOCATION: Where is the pain located?     Tingling in hands; fingers; numbness, cold hands, numbness intermittently 3. PAIN: How bad is the pain? (Scale 1-10; or mild, moderate, severe)     Mild aching 5. CAUSE: What do you think is causing the pain?     unsure 6. AGGRAVATING FACTORS: What makes the pain worse? (e.g., using computer)     No; just hurting in the morning 7. OTHER SYMPTOMS: Do you have any other symptoms? (e.g., fever, neck pain, numbness or tingling, rash, swelling)     , shoulder pain right Denies fever, neck pain  Protocols used: Hand Pain-A-AH

## 2024-06-13 ENCOUNTER — Ambulatory Visit (HOSPITAL_BASED_OUTPATIENT_CLINIC_OR_DEPARTMENT_OTHER)
Admission: RE | Admit: 2024-06-13 | Discharge: 2024-06-13 | Disposition: A | Discharge status: Home / Self Care | Source: Ambulatory Visit | Attending: Medical | Admitting: Medical

## 2024-06-13 ENCOUNTER — Ambulatory Visit: Admitting: Medical

## 2024-06-13 ENCOUNTER — Ambulatory Visit: Payer: Self-pay | Admitting: Medical

## 2024-06-13 VITALS — BP 122/84 | HR 69 | Temp 98.1°F | Resp 15 | Ht 64.0 in | Wt 222.8 lb

## 2024-06-13 DIAGNOSIS — G5603 Carpal tunnel syndrome, bilateral upper limbs: Secondary | ICD-10-CM

## 2024-06-13 DIAGNOSIS — Z23 Encounter for immunization: Secondary | ICD-10-CM

## 2024-06-13 DIAGNOSIS — M25511 Pain in right shoulder: Secondary | ICD-10-CM

## 2024-06-13 DIAGNOSIS — M19011 Primary osteoarthritis, right shoulder: Secondary | ICD-10-CM | POA: Diagnosis not present

## 2024-06-13 MED ORDER — METHYLPREDNISOLONE 4 MG PO TABS: ORAL_TABLET | ORAL | 0 refills | Status: AC

## 2024-06-13 NOTE — Patient Instructions (Addendum)
 Bilateral carpal tunnel syndrome Intermittent tingling and numbness in both hands, more pronounced at night, consistent with carpal tunnel syndrome. Differential diagnosis includes osteoarthritis, but carpal tunnel syndrome is more likely. - Referred to Dr. Roselie, hand specialist, for further evaluation. - Provided education sheet on carpal tunnel syndrome. - Recommended over-the-counter wrist cock-up splints for use at night and during the day on the left hand if needed. -well get ortho opinion. If not CTS then Raynauds may be in differential.  Right shoulder pain Acute right shoulder pain for five days, exacerbated by lifting the arm above the head. Pain may be related to arthritis, given her history of arthritis in the back and knees. - Ordered right shoulder x-ray to assess for arthritic changes. - Prescribed Medrol  dose pack (4 mg) for six days, with instructions to decrease by one tablet each day. Advised to avoid ibuprofen or Aleve while on Medrol . - Advised to eat with food and maintain a low sugar diet while on Medrol .  Ask for follow up by my chart next wed or sooner if needed  Preventing Problems Caused by Pinched Nerve in the Wrist (Carpal Tunnel Syndrome)  A pinched nerve in the wrist (carpal tunnel syndrome, or CTS) is a nerve problem that causes pain, numbness, and weakness in the wrist, hand, and fingers. The carpal tunnel is a narrow, rigid space in the wrist. Tendons and the median nerve pass through this tunnel. The median nerve gives sensation or feeling to the thumb, the muscles at the base of the thumb, and the first three fingers. CTS happens when the median nerve gets squeezed in this tunnel. In some cases, it may not be possible to prevent CTS. But you can take steps to relieve pressure on your wrist and reduce the risk of getting CTS. How can CTS affect me? CTS can make it hard to use your hands, wrists, and fingers do jobs or activities. It can cause symptoms such  as: Pain in the wrist, hand, and fingers. Burning, tingling, or numbness in the affected area. A weak feeling in your hands. You may have trouble grabbing and holding items. Symptoms may get worse over time. For some people, symptoms get worse at night. What can increase my risk? The following factors may make you more likely to get CTS: Having a job where you often bend or move your wrist a lot, or use tools that vibrate. This can include jobs like using computers, working on an theatre stage manager, or using power tools like radiographer, therapeutic. Being a woman. Having a family history of CTS. Having certain conditions, such as: Diabetes. Pregnancy. Obesity. Thyroid  disease. Rheumatoid arthritis. What actions can I take to help prevent CTS? Change how you work     If you use your hands and wrists for many hours at work, make changes to your work space to ease pressure on your wrists. You may want to use: A padded wrist rest for computer work. Use this to lightly rest your wrist and hands when you are not actively keying. A keyboard at a height in which your wrists are straight when typing. You may need to flatten the keyboard or even tilt it away from you. Hand tools with padded handles or work gloves with padding to reduce vibrations. When using a computer keyboard or mouse, keep your wrists straight. Do not bend them down or to the side. Keep your arms and shoulders relaxed, and your elbows close to your body. Make other changes  Try not to make the same hand and wrist movements over and over. Avoid doing anything that makes your wrist bend, get stiff, or painful. Take breaks often if you use your hands and wrists for many hours at a time. Take breaks every 30 minutes or so. Avoid sitting for too long. Try to get up and move every 30 minutes. Stretch your hands and fingers often to increase blood flow and relieve tension. Talk to your health care provider about wearing a wrist brace or  support. This will not prevent CTS but it may keep it from getting worse. A wrist brace may help reduce bending and stress. Manage any medical conditions that can put you at risk for CTS. Have your blood sugar checked to make sure you do not have diabetes. If you have diabetes, work with your provider to keep your blood sugar under control. Do nerve gliding or flossing exercises. These help keep nerves moving smoothly through the tissues around them. You'll be taught how to do these. Follow instructions from your provider. Contact a health care provider if: You have numbness or tingling in your wrist, hand, or fingers. You have pain or a burning sensation in your wrist, hand, or fingers. Pain, tingling, or burning wakes you up at night. Your hand becomes weak and clumsy. You often drop objects. You feel pain every time you use your wrists and hands. This information is not intended to replace advice given to you by your health care provider. Make sure you discuss any questions you have with your health care provider. Document Revised: 03/09/2023 Document Reviewed: 03/09/2023 Elsevier Patient Education  2024 [employer].

## 2024-06-13 NOTE — Progress Notes (Signed)
Pt called and aware of results.

## 2024-06-13 NOTE — Progress Notes (Signed)
   Subjective:    Patient ID: Wanda [name], female    DOB: [birth year], 78 y.o.   MRN: [id number]  HPI Wanda [name] is a 78 year old female who presents with tingling and numbness in her hands.  She has experienced tingling and numbness in her hands for approximately three years. Symptoms are intermittent, more frequent at night, and include a sensation of coldness and loss of feeling in the fingers. Wearing gloves for extended periods is difficult as it feels like circulation is being cut off.  She has chronic back pain and arthritis in her back and knees, managed with ibuprofen, which provides some relief for her symptoms.  Recently, she developed new pain in her right shoulder, persisting for about a week. The pain worsens with lifting her arm above her head, though she can lift it to the side without pain. There is no recent injury or fall, and no neck pain or pain radiating to the arms.   Review of Systems See hpi    Objective:   Physical Exam  General- No acute distress. Pleasant patient. Neck- Full range of motion, no jvd. No mid c spine pain Lungs- Clear, even and unlabored. Heart- regular rate and rhythm. Neurologic- CNII- XII grossly intact.  Upper ext- on wrist manipulation fingers tingle easily. Pulses intact. Digits not cold. Rt shoulder- pain on abduction and pain on palpation posterior aspect.       Assessment & Plan:   Bilateral carpal tunnel syndrome Intermittent tingling and numbness in both hands, more pronounced at night, consistent with carpal tunnel syndrome. Differential diagnosis includes osteoarthritis, but carpal tunnel syndrome is more likely. - Referred to Dr. Roselie, hand specialist, for further evaluation. - Provided education sheet on carpal tunnel syndrome. - Recommended over-the-counter wrist cock-up splints for use at night and during the day on the left hand if needed. -well get ortho opinion. If not CTS then Raynauds may be in  differential.  Right shoulder pain Acute right shoulder pain for five days, exacerbated by lifting the arm above the head. Pain may be related to arthritis, given her history of arthritis in the back and knees. - Ordered right shoulder x-ray to assess for arthritic changes. - Prescribed Medrol  dose pack (4 mg) for six days, with instructions to decrease by one tablet each day. Advised to avoid ibuprofen or Aleve while on Medrol . - Advised to eat with food and maintain a low sugar diet while on Medrol .  Ask for follow up by my chart next wed or sooner if needed

## 2024-06-30 ENCOUNTER — Ambulatory Visit

## 2024-07-02 NOTE — Progress Notes (Signed)
 Wanda [name] - 78 y.o. female MRN [id number]  Date of birth: 10/15/1945  Office Visit Note: Visit Date: 07/03/2024 PCP: Amon Aloysius BRAVO, MD Referred by: Dorina Loving, PA-C  Subjective: No chief complaint on file.  HPI: Wanda [name] is a pleasant 78 y.o. female who presents today for evaluation of ongoing numbness and tingling in the bilateral hands, right greater than left.  Symptoms have been present now for multiple years, worsening in nature.  Does have ongoing nocturnal symptoms as well.  She has been trialing compression gloves to the bilateral hands with minimal relief of her symptoms.  She is also describing some ongoing pain in the neck and right upper shoulder region.  Pertinent ROS were reviewed with the patient and found to be negative unless otherwise specified above in HPI.   Visit Reason: bilateral hands Duration of symptoms: 1 year Hand dominance: right Occupation: retired Diabetic: No Smoking: No Heart/Lung History: none Blood Thinners: none  Prior Testing/EMG: none Injections (Date): none Treatments: gloves Prior Surgery: none     Assessment & Plan: Visit Diagnoses:  1. Bilateral hand numbness     Plan: Extensive discussion was had with the patient today about their ongoing bilateral upper extremity symptoms.  Her symptoms and clinical examination today appears to be consistent with bilateral carpal tunnel syndrome.  We discussed the underlying etiology and pathophysiology of carpal tunnel syndrome.  We discussed treatment modalities ranging from conservative to surgical.  From a conservative standpoint, we discussed ongoing activity modification, bracing, nonsteroidal anti-inflammatory medication, therapeutic exercise and stretching and possible cortisone injections.  We discussed anti-inflammatory medication both topical and oral.  We did also discuss the possibility of carpal tunnel release both open and endoscopic procedures, as well as appropriate  postoperative regimen should symptoms remain refractory to conservative care.  I would like to begin with bilateral upper extremity electrodiagnostic studies at this juncture.  She does have some symptoms emanating from the cervical region and I would like to rule out any significant double crush pathology prior to proceeding with carpal tunnel releases bilaterally.  She expressed understanding, will return after the EMGs are complete.  For the time being, I have fitted her for bilateral wrist braces to be utilized for nocturnal symptoms in particular.   Follow-up: No follow-ups on file.   Meds & Orders: No orders of the defined types were placed in this encounter.   Orders Placed This Encounter  Procedures   Ambulatory referral to Physical Medicine Rehab     Procedures: No procedures performed      Clinical History: No specialty comments available.  She reports that she has never smoked. She has never used smokeless tobacco. No results for input(s): HGBA1C, LABURIC in the last 8760 hours.  Objective:   Vital Signs: There were no vitals taken for this visit.  Physical Exam  Gen: Well-appearing, in no acute distress; non-toxic CV: Regular Rate. Well-perfused. Warm.  Resp: Breathing unlabored on room air; no wheezing. Psych: Fluid speech in conversation; appropriate affect; normal thought process  Ortho Exam PHYSICAL EXAM:  General: Patient is well appearing and in no distress.   Skin and Muscle: No skin changes are apparent to upper extremities.   Range of Motion and Palpation Tests: Mobility is full about the elbows with flexion and extension. Forearm supination and pronation are 85/85 bilaterally.  Wrist flexion/extension is 75/65 bilaterally.  Digital flexion and extension are full.  Thumb opposition is full to the base of the small fingers bilaterally.  No cords or nodules are palpated.  No triggering is observed.    Neurologic, Vascular, Motor: Sensation is  diminished to light touch in the bilateral median nerve distribution.    Thenar atrophy: Positive bilateral Tinel sign: Positive bilateral carpal tunnel Carpal tunnel compression: Positive bilateral Phalen test: Positive bilateral   Motor bilateral hand FPL: 5/5 Index FDP: 5/5 APB: 4/5, thumb opposition is preserved bilateral   Fingers pink and well perfused.  Capillary refill is brisk.     No results found for: HGBA1C    Imaging: No results found.  Past Medical/Family/Surgical/Social History: Medications & Allergies reviewed per EMR, new medications updated. Patient Active Problem List   Diagnosis Date Noted   Insomnia 04/23/2024   Cervical radiculopathy at C8 (Left) 12/01/2020   Fibromyalgia 08/04/2020   Uncomplicated opioid dependence (HCC) 08/02/2020   Spondylosis without myelopathy or radiculopathy, lumbosacral region 11/18/2019   Elevated sed rate 03/21/2019   Abnormal MRI, lumbar spine (03/20/2018) 09/02/2018   PCP NOTES >>>>>> 08/31/2016   Morbid obesity with body mass index (BMI) of 40.0 to 44.9 in adult [facility]) 03/21/2016   Annual physical exam 08/18/2015   Chronic pain syndrome 05/20/2015   Lumbar spondylosis 05/20/2015   Myofascial pain 05/20/2015   Vitamin D  deficiency 11/03/2008   Dyslipidemia 07/01/2007   Depression, major, single episode, moderate (HCC) 07/01/2007   Essential hypertension 07/01/2007   Allergic rhinitis 07/01/2007   CLIMACTERIC STATE, FEMALE 07/01/2007   Past Medical History:  Diagnosis Date   Allergy    RHINITIS   Chronic back pain    Depression    DJD (degenerative joint disease)    back pain , on disability   Hyperlipidemia    Hypertension    Menopause    onset age 43    Musculoskeletal pain 12/07/2015   TMJ PAIN 05/17/2010   Qualifier: Diagnosis of  By: Amon MD, Aloysius BRAVO.    Family History  Problem Relation Age of Onset   Cancer Sister        ??CERVICAL   Cancer Brother        STOMACH   Breast cancer Sister         sister dx age 57, aunt, cousin x 2    Colon cancer Brother        dx age ~ 69 ?   Diabetes Other        aunt    Kidney cancer Son    Breast cancer Maternal Aunt    Coronary artery disease Neg Hx    CAD Neg Hx    Past Surgical History:  Procedure Laterality Date   G3 P3      Social History   Occupational History   Occupation: [employer]  Tobacco Use   Smoking status: Never   Smokeless tobacco: Never  Vaping Use   Vaping status: Never Used  Substance and Sexual Activity   Alcohol use: No   Drug use: No   Sexual activity: Not Currently    Nili Honda Estela) Arlinda, M.D. [facility], Hand Surgery

## 2024-07-03 ENCOUNTER — Ambulatory Visit: Admitting: Orthopedic Surgery

## 2024-07-03 ENCOUNTER — Telehealth: Payer: Self-pay | Admitting: Orthopedic Surgery

## 2024-07-03 DIAGNOSIS — R200 Anesthesia of skin: Secondary | ICD-10-CM

## 2024-07-03 NOTE — Telephone Encounter (Signed)
 Pt returned call that she received to schedule a hand test. I looked in her chart and couldn't; find the message that she said she received. Call back number is [phone].

## 2024-07-07 ENCOUNTER — Other Ambulatory Visit: Payer: Self-pay

## 2024-07-07 ENCOUNTER — Ambulatory Visit

## 2024-07-07 VITALS — BP 110/78 | Ht 64.0 in | Wt 222.0 lb

## 2024-07-07 DIAGNOSIS — R202 Paresthesia of skin: Secondary | ICD-10-CM | POA: Diagnosis not present

## 2024-07-07 DIAGNOSIS — M25511 Pain in right shoulder: Secondary | ICD-10-CM

## 2024-07-07 DIAGNOSIS — G5601 Carpal tunnel syndrome, right upper limb: Secondary | ICD-10-CM | POA: Diagnosis not present

## 2024-07-07 DIAGNOSIS — R200 Anesthesia of skin: Secondary | ICD-10-CM

## 2024-07-07 DIAGNOSIS — G5603 Carpal tunnel syndrome, bilateral upper limbs: Secondary | ICD-10-CM

## 2024-07-07 DIAGNOSIS — M25811 Other specified joint disorders, right shoulder: Secondary | ICD-10-CM

## 2024-07-07 DIAGNOSIS — G5602 Carpal tunnel syndrome, left upper limb: Secondary | ICD-10-CM | POA: Diagnosis not present

## 2024-07-07 MED ORDER — NAPROXEN 500 MG PO TABS: 500.0000 mg | ORAL_TABLET | Freq: Two times a day (BID) | ORAL | 1 refills | Status: AC

## 2024-07-07 NOTE — Progress Notes (Signed)
° °  Subjective:    Patient ID: Wanda [name] Hipp, female    DOB: 78 y.o., [birth year]   MRN: [id number]  Chief Complaint: Right shoulder pain  Discussed the use of AI scribe software for clinical note transcription with the patient, who gave verbal consent to proceed.  History of Present Illness Wanda [name] is a 78 year old female who presents with bilateral hand numbness and tingling, and right shoulder pain. She was referred by another doctor to a hand specialist for evaluation of her symptoms.  Bilateral hand paresthesia - Daily numbness and tingling in both hands, worse at night - Symptoms frequently wake her from sleep - Partial relief with wrist brace; improved relief with a new brace - Hand specialist suspected carpal tunnel syndrome  Right shoulder pain - Gradually worsening pain in the right shoulder - No history of specific injury - Pain exacerbated by raising arm or reaching behind back - Recent imaging of the right shoulder performed - Ibuprofen provides partial relief  Relevant medical history - Prior back issues treated with injections - No history of diabetes or hypothyroidism  Review of pertinent imaging: Three-view plain film radiographs obtained of the right shoulder on 06/13/2024 from independent review revealing degenerative changes noted at the [facility] joint, osteophytic change noted at the greater tuberosity humerus.  Large degree of narrowing of the subacromial space with mild spurring noted laterally of the acromion.  Type III acromion.    Objective:   Vitals:   07/07/24 1040  BP: 110/78    Right Shoulder ( compared to normal ) Inspection: - swelling, - scapular dyskinesis Palpation: TTP + greater tuberosity, - AC joint, + biceps tendon, - posterior shoulder AROM/PROM 160 forward flexion, 160 abduction, 15 external rotation, internal rotation to lumbar spine Strength: 5/5 lift off, 5/5 empty can, 5/5 external rotation, 5/5 flexion, - drop arm  test Special tests:    -Rotator Cuff: + Neer's, + Hawkin's, equivocal empty can, + painful arc   -Labrum: - O'brien's, - Jerk   -Biceps: + speed's, - yergason's    -AC Joint: - cross arm testing     -Instability: - external rotation/apprehension/relocation test, - sulcus sign  Left wrist: Positive Tinel's.  Positive Durkan's/Phalen's.  Right wrist: Positive Tinel's, equivocal Durkan's/Phalen's.  Limited ultrasound evaluation of the bilateral wrist: Left wrist median nerve cross-sectional area 13 mm Right wrist median nerve cross-sectional area 14 mm Interpretation: Bilateral carpal tunnel syndrome    Assessment & Plan:   Assessment & Plan Bilateral carpal tunnel syndrome   Ultrasound confirms enlarged median nerves in both wrists, causing numbness, tingling, and pain, especially at night. Chronic wrist positioning and use likely exacerbate the condition. Wrist cock-up splints provide partial relief, but symptoms persist. Scheduled ultrasound-guided injections for both wrists to relieve median nerve pressure. Continue wearing wrist cock-up splints at night. Discussed the need for a driver post-injection due to anesthetic use.  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 Imaging reveals degenerative changes at the [facility] joint and osteophytic changes at the greater tuberosity of the humerus, likely due to mechanical impingement of the rotator cuff muscle. Prescribed anti-inflammatory medication to reduce inflammation and pain. Provided a handout on shoulder mechanics and exercises to improve function. Scheduled follow-up in 4-6 weeks to reassess the shoulder condition.

## 2024-07-10 ENCOUNTER — Encounter: Payer: Self-pay | Admitting: Pharmacist

## 2024-07-10 NOTE — Progress Notes (Signed)
 Pharmacy Quality Measure Review  This patient is appearing on a report for being at risk of failing the adherence measure for hypertension (ACEi/ARB) medications this calendar year.   Medication: lisinopril   Last fill date: 03/10/2024 for 90 day supply per adhernce report  Reviewed recent refill history in Dr Annemarie database. Actual last refill date was 06/12/2024 for 90 day supply. Patient has 1 refill remaining. Next appointment with PCP is 07/29/2024.    Insurance report was not up to date. No action needed at this time.

## 2024-07-15 ENCOUNTER — Other Ambulatory Visit: Payer: Self-pay | Admitting: Internal Medicine

## 2024-07-18 ENCOUNTER — Other Ambulatory Visit: Payer: Self-pay | Admitting: Internal Medicine

## 2024-07-28 ENCOUNTER — Ambulatory Visit

## 2024-07-28 ENCOUNTER — Other Ambulatory Visit: Payer: Self-pay

## 2024-07-28 VITALS — BP 138/80 | Ht 64.0 in | Wt 222.0 lb

## 2024-07-28 DIAGNOSIS — G5602 Carpal tunnel syndrome, left upper limb: Secondary | ICD-10-CM | POA: Diagnosis not present

## 2024-07-28 MED ORDER — METHYLPREDNISOLONE ACETATE 40 MG/ML IJ SUSP
20.0000 mg | Freq: Once | INTRAMUSCULAR | Status: AC
  Administered 2024-07-28: 20 mg via INTRA_ARTICULAR

## 2024-07-28 NOTE — Progress Notes (Signed)
" ° °  Subjective:    Patient ID: Wanda [name], female    DOB: 79 y.o., Jan 20, 1946   MRN: [id number]  Chief Complaint: Left carpal tunnel syndrome  History of Present Illness  Patient presents for left carpal tunnel syndrome injection     Objective:   Vitals:   07/28/24 1043  BP: 138/80    Left carpal Tunnel Injection with Ultrasound Guidance Procedure Note  Wanda [name] 02-Jun-1946 Indications: Numbness Procedure Details Following the description of risks including infection bleeding, damage to surrounding structures, patient provided verbal/written consent for left carpal tunnel injection procedure with ultrasound guidance. Patient was sterilely prepped in the usual fashion with chlorhexidine. Following topical anesthetization with ethyl chloride, sterile ultrasound technique was used to identify the median nerve. Patient was then injected with a solution of 20mg  Depo-medrol , 3cc of 2% Mepivicaine, and 0.5cc of 4.2% Sodium Bicarbonate. This was well visualized under ultrasound, please see associated photographic documentation. Patient tolerated well without complication. Precautions provided. Cleaned and dressing applied.      Assessment & Plan:   Assessment & Plan Patient tolerated procedure well.  She will follow-up in the next several days to repeat the process on the contralateral side.   "

## 2024-07-29 ENCOUNTER — Ambulatory Visit: Admitting: Internal Medicine

## 2024-07-30 ENCOUNTER — Encounter: Admitting: Physical Medicine and Rehabilitation

## 2024-08-01 ENCOUNTER — Other Ambulatory Visit: Payer: Self-pay

## 2024-08-01 ENCOUNTER — Ambulatory Visit

## 2024-08-01 VITALS — BP 138/80 | Ht 64.0 in | Wt 222.0 lb

## 2024-08-01 DIAGNOSIS — G5601 Carpal tunnel syndrome, right upper limb: Secondary | ICD-10-CM | POA: Diagnosis not present

## 2024-08-01 DIAGNOSIS — G5602 Carpal tunnel syndrome, left upper limb: Secondary | ICD-10-CM

## 2024-08-01 DIAGNOSIS — G5603 Carpal tunnel syndrome, bilateral upper limbs: Secondary | ICD-10-CM | POA: Diagnosis not present

## 2024-08-01 MED ORDER — METHYLPREDNISOLONE ACETATE 40 MG/ML IJ SUSP
40.0000 mg | Freq: Once | INTRAMUSCULAR | Status: AC
  Administered 2024-08-01: 20 mg via INTRA_ARTICULAR

## 2024-08-01 NOTE — Progress Notes (Signed)
" ° °  Subjective:    Patient ID: Wanda [name], female    DOB: 79 y.o., 19-Apr-1946   MRN: [id number]  Chief Complaint: Right-sided carpal tunnel syndrome  History of Present Illness  Wanda [name] is a 79 year old female who is well-known to me presenting for follow-up on left-sided carpal tunnel and also presenting for right sided carpal tunnel syndrome injection.  Prior diagnostic imaging of her median nerves revealed a cross-sectional area of the left median nerve of 13 mm and 14 mm in the right.    Objective:   Vitals:   08/01/24 0934  BP: 138/80   Right carpal Tunnel Injection with Ultrasound Guidance Procedure Note  Wanda [name] 1945-10-14 Indications: Numbness Procedure Details Following the description of risks including infection bleeding, damage to surrounding structures, patient provided verbal/written consent for right carpal tunnel injection procedure with ultrasound guidance. Patient was sterilely prepped in the usual fashion with chlorhexidine. Following topical anesthetization with ethyl chloride, sterile ultrasound technique was used to identify the median nerve. Patient was then injected with a solution of 20mg  Depo-medrol , 4cc of 2% Mepivicaine, and 0.25cc of 8.4% Sodium Bicarbonate.  This was well visualized under ultrasound, please see associated photographic documentation. Patient tolerated well without complication. Precautions provided. Cleaned and dressing applied.     Assessment & Plan:   Assessment & Plan Still is a 79 year old female with prior diagnosis based on ultrasonographic imaging consistent with bilateral carpal tunnel syndrome.  She previously tolerated a left carpal tunnel syndrome injection approximately 1 week ago.  Today she tolerated this well on her right side.  Instructed her to continue wrist cock-up splint use nocturnally.  We will follow-up in 1 month to reassess.   "

## 2024-08-11 ENCOUNTER — Ambulatory Visit (HOSPITAL_BASED_OUTPATIENT_CLINIC_OR_DEPARTMENT_OTHER)

## 2024-08-11 ENCOUNTER — Other Ambulatory Visit (HOSPITAL_BASED_OUTPATIENT_CLINIC_OR_DEPARTMENT_OTHER)

## 2024-08-11 ENCOUNTER — Telehealth: Payer: Self-pay | Admitting: Internal Medicine

## 2024-08-11 NOTE — Telephone Encounter (Signed)
 Copied from CRM #[id number]. Topic: Appointments - Appointment Cancel/Reschedule >> Aug 11, 2024  7:45 AM Aleatha BROCKS wrote: Patient/patient representative is calling to cancel or reschedule an appointment. Refer to attachments for appointment information.

## 2024-08-29 ENCOUNTER — Encounter: Admitting: Physical Medicine and Rehabilitation

## 2024-08-29 DIAGNOSIS — R202 Paresthesia of skin: Secondary | ICD-10-CM

## 2024-08-29 NOTE — Progress Notes (Unsigned)
 Pain Scale   Average Pain 2 Patient advising she has bilateral hand and wrist pain with numbness and tingling and some weakness. Patient is Right hand Dominate        +Driver, -BT, -Dye Allergies.

## 2024-08-29 NOTE — Progress Notes (Unsigned)
 "  SHAASIA ODLE - 79 y.o. female MRN [id number]  Date of birth: [birth year]  Office Visit Note: Visit Date: 08/29/2024 PCP: Amon Aloysius BRAVO, MD Referred by: Arlinda Buster, MD  Subjective: Chief Complaint  Patient presents with   Right Hand - Pain, Numbness, Weakness   Left Hand - Pain, Numbness, Weakness   HPI: KENITRA LEVENTHAL is a 79 y.o. female who comes in todayHPI   ROS Otherwise per HPI.  Assessment & Plan: Visit Diagnoses: No diagnosis found.   Plan: No additional findings.   Meds & Orders: No orders of the defined types were placed in this encounter.  No orders of the defined types were placed in this encounter.   Follow-up: No follow-ups on file.   Procedures: No procedures performed      Clinical History: No specialty comments available.   She reports that she has never smoked. She has never used smokeless tobacco. No results for input(s): HGBA1C, LABURIC in the last 8760 hours.  Objective:  VS:  HT:    WT:   BMI:     BP:   HR: bpm  TEMP: ( )  RESP:  Physical Exam  Ortho Exam  Imaging: No results found.  Past Medical/Family/Surgical/Social History: Medications & Allergies reviewed per EMR, new medications updated. Patient Active Problem List   Diagnosis Date Noted   Insomnia 04/23/2024   Cervical radiculopathy at C8 (Left) 12/01/2020   Fibromyalgia 08/04/2020   Uncomplicated opioid dependence (HCC) 08/02/2020   Spondylosis without myelopathy or radiculopathy, lumbosacral region 11/18/2019   Elevated sed rate 03/21/2019   Abnormal MRI, lumbar spine (03/20/2018) 09/02/2018   PCP NOTES >>>>>> 08/31/2016   Morbid obesity with body mass index (BMI) of 40.0 to 44.9 in adult [facility]) 03/21/2016   Annual physical exam 08/18/2015   Chronic pain syndrome 05/20/2015   Lumbar spondylosis 05/20/2015   Myofascial pain 05/20/2015   Vitamin D  deficiency 11/03/2008   Dyslipidemia 07/01/2007   Depression, major, single episode, moderate (HCC) 07/01/2007    Essential hypertension 07/01/2007   Allergic rhinitis 07/01/2007   CLIMACTERIC STATE, FEMALE 07/01/2007   Past Medical History:  Diagnosis Date   Allergy    RHINITIS   Chronic back pain    Depression    DJD (degenerative joint disease)    back pain , on disability   Hyperlipidemia    Hypertension    Menopause    onset age 32    Musculoskeletal pain 12/07/2015   TMJ PAIN 05/17/2010   Qualifier: Diagnosis of  By: Amon MD, Aloysius BRAVO.    Family History  Problem Relation Age of Onset   Cancer Sister        ??CERVICAL   Cancer Brother        STOMACH   Breast cancer Sister        sister dx age 72, aunt, cousin x 2    Colon cancer Brother        dx age ~ 48 ?   Diabetes Other        aunt    Kidney cancer Son    Breast cancer Maternal Aunt    Coronary artery disease Neg Hx    CAD Neg Hx    Past Surgical History:  Procedure Laterality Date   G3 P3      Social History   Occupational History   Occupation: ON DISABILITY  Tobacco Use   Smoking status: Never   Smokeless tobacco: Never  Vaping Use   Vaping status:  Never Used  Substance and Sexual Activity   Alcohol use: No   Drug use: No   Sexual activity: Not Currently    "

## 2024-09-02 ENCOUNTER — Ambulatory Visit

## 2025-05-21 ENCOUNTER — Ambulatory Visit
